# Patient Record
Sex: Male | Born: 1935
Health system: Southern US, Community
[De-identification: ages and names within clinical notes are randomized; demographics above are authoritative.]

## PROBLEM LIST (undated history)

## (undated) DIAGNOSIS — E785 Hyperlipidemia, unspecified: Secondary | ICD-10-CM

## (undated) DIAGNOSIS — R351 Nocturia: Secondary | ICD-10-CM

## (undated) DIAGNOSIS — Z8673 Personal history of transient ischemic attack (TIA), and cerebral infarction without residual deficits: Secondary | ICD-10-CM

## (undated) DIAGNOSIS — I2510 Atherosclerotic heart disease of native coronary artery without angina pectoris: Secondary | ICD-10-CM

## (undated) DIAGNOSIS — M109 Gout, unspecified: Secondary | ICD-10-CM

## (undated) DIAGNOSIS — N401 Enlarged prostate with lower urinary tract symptoms: Secondary | ICD-10-CM

## (undated) DIAGNOSIS — I10 Essential (primary) hypertension: Secondary | ICD-10-CM

## (undated) HISTORY — DX: Benign prostatic hyperplasia with lower urinary tract symptoms: N40.1

## (undated) HISTORY — PX: OTHER SURGICAL HISTORY: SHX169

## (undated) HISTORY — PX: CORONARY STENT PLACEMENT: SHX1402

## (undated) HISTORY — DX: Atherosclerotic heart disease of native coronary artery without angina pectoris: I25.10

## (undated) HISTORY — DX: Gout, unspecified: M10.9

## (undated) HISTORY — DX: Personal history of transient ischemic attack (TIA), and cerebral infarction without residual deficits: Z86.73

## (undated) HISTORY — PX: CARDIAC SURGERY: SHX584

## (undated) HISTORY — DX: Nocturia: R35.1

## (undated) NOTE — *Deleted (*Deleted)
HEMATOLOGY/ONCOLOGY CLINIC NOTE  Date of Service: 03/09/20    Patient Care Team: [name], MD as PCP - General (Family Medicine) [name], MD as PCP - Cardiology (Cardiology)  CHIEF COMPLAINTS/PURPOSE OF CONSULTATION:  -metastatic prostate cancer -Myelopthisic anemia  HISTORY OF PRESENTING ILLNESS:   Tony [name] is a wonderful 74 y.o. male who has been referred to [place] by Dr Dimas Chyle for evaluation and management of anemia. He is accompanied today by his daughter. The pt reports that he is doing well overall.   The pt reports a new onset of fatigue that began in January 2019. His daughter notes being able to tell a difference in his energy levels as early as November 2018. He notes that some cramping pain in his thighs. He notes that his recent 07/23/17 blood transfusion has led to a "terrific, immediate change" in his thigh pain. However, his thigh pain has returned in the last couple days.  He notes that he takes 1065mcg Vitamin B12 daily.   He notes that for 6-7 years he took iron pills after his 2012 heart attack and stroke. He notes that he took a statin for 6-7 months and was taken off of it recently. He notes no unresolved symptoms from his stroke.   The pt notes that over the last 4-5 months his appetite has diminished and he has subsequently lost about 20 lbs in that time.  He notes that he has historically not preferred to drink water as such, but hydrates with juice, coffee, and other drinks.  He notes that he believes that he is able to take care of himself adequately at home. He also notes that his cane is sufficient for helping him get around.   Most recent lab results (07/22/17) of CBC  is as follows: all values are WNL except for RBC at 2.94, Hgb at 8.7, HCT at 27.2, RDW at 26.5, Platelets at 134k. CBC from 10/30/16 revealed all values WNL.  CMP 07/21/17 revealed all values WNL except for Sodium at 134, Glucose at 101, Creatinine at 1.85, Calcium  at 7.9, Total Protein at 5.7, Albumin at 2.7, Alk Phos at 433.  LDH 07/20/17 elevated at 256. Haptoglobin 07/20/17 elevated at 357.  Vitamin B12 07/06/17 is WNL at 229.   On review of systems, pt reports decreased appetite, losing 20 lbs over 4-5 months, bilateral thigh pain, fatigue, mild leg swelling, and denies fevers, chills, night sweats, bone pains, nausea, abdominal pains, bleeding, blood in the urine, blood in the stools, black stools, changes in bowel habits, light headednss, dizziness, abdominal pains, noticing any new lumps or bumps, testicular pain or swelling, and any other symptoms.   On PMHx the pt reports heart attack and stroke in 2012. On Social Hx the pt denies much ETOH consumption.   Interval History: Tony [name] returns today regarding his metastatic prostate cancer and myelophthisic anemia. We are joined by his daughter, Tony [name].*** The patient's last visit with [place] was on 02/10/2020. The pt reports that he is doing well overall.  The pt reports ***  Of note since the patient's last visit, pt has had *** completed on *** with results revealing ***.  Lab results today (03/09/20) of CBC w/diff and CMP is as follows: all values are WNL except for ***.  On review of systems, pt reports *** and denies ***and any other symptoms.   A&P: -Discussed pt labwork today, 03/09/20; *** -***  MEDICAL HISTORY:  Past Medical History:  Diagnosis Date  . BPH associated with nocturia    flomax 0.4--> 0.8 mg trial. nocturia if has coffee. some incontinence  . CAD (coronary artery disease)    LAD Stent 2012. 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   . Gout    no rx. apparently 1x in past  . History of stroke    no aspirin before stroke. no deficits. slurred words at time of stroke  . Hyperlipidemia   . Hypertension     SURGICAL HISTORY: Past Surgical History:  Procedure Laterality Date  . CARDIAC SURGERY     Stint  . CORONARY STENT PLACEMENT    . left  arm fracture s/p surgery    . right leg fracture s.p surgery- screws like arm      SOCIAL HISTORY: Social History   Socioeconomic History  . Marital status: Married    Spouse name: Not on file  . Number of children: Not on file  . Years of education: Not on file  . Highest education level: Not on file  Occupational History  . Not on file  Tobacco Use  . Smoking status: Former Smoker    Packs/day: 0.50    Years: 1.00    Pack years: 0.50    Types: Cigarettes    Quit date: 05/01/1952    Years since quitting: 67.9  . Smokeless tobacco: Never Used  Vaping Use  . Vaping Use: Never used  Substance and Sexual Activity  . Alcohol use: No  . Drug use: No  . Sexual activity: Not on file  Other Topics Concern  . Not on file  Social History Narrative   Married- lives separate from wife. 2 children. Daughter passed from heart attack.       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 Difficulty of Paying Living Expenses: Not on file  Food Insecurity:   . Worried About Charity fundraiser in the Last Year: Not on file  . Ran Out of Food in the Last Year: Not on file  Transportation Needs:   . Lack of Transportation (Medical): Not on file  . Lack of Transportation (Non-Medical): Not on file  Physical Activity:   . Days of Exercise per Week: Not on file  . Minutes of Exercise per Session: Not on file  Stress:   . Feeling of Stress : Not on file  Social Connections:   . Frequency of Communication with Friends and Family: Not on file  . Frequency of Social Gatherings with Friends and Family: Not on file  . Attends Religious Services: Not on file  . Active Member of Clubs or Organizations: Not on file  . Attends Archivist Meetings: Not on file  . Marital Status: Not on file  Intimate Partner Violence:   . Fear of Current or Ex-Partner: Not on file  . Emotionally Abused:  Not on file  . Physically Abused: Not on file  . Sexually Abused: Not on file    FAMILY HISTORY: Family History  Problem Relation Age of Onset  . Breast cancer Mother   . Heart disease Father   . Heart disease Brother        x2  . Prostate cancer Brother     ALLERGIES:  is allergic to gabapentin.  MEDICATIONS:  No current facility-administered medications for this visit.   No current  outpatient medications on file.   Facility-Administered Medications Ordered in Other Visits  Medication Dose Route Frequency Provider Last Rate Last Admin  . acetaminophen (TYLENOL) tablet 650 mg  650 mg Oral Q6H PRN Bonnielee Haff, MD       Or  . acetaminophen (TYLENOL) suppository 650 mg  650 mg Rectal Q6H PRN Bonnielee Haff, MD      . aspirin EC tablet 81 mg  81 mg Oral Daily Bonnielee Haff, MD   81 mg at 03/08/20 1000  . carvedilol (COREG) tablet 12.5 mg  12.5 mg Oral BID WC Bonnielee Haff, MD   12.5 mg at 03/08/20 1735  . feeding supplement (NEPRO CARB STEADY) liquid 237 mL  237 mL Oral TID BM Bonnielee Haff, MD 237 mL/hr at 03/08/20 1349 237 mL at 03/08/20 2126  . fentaNYL (DURAGESIC) 25 MCG/HR 1 patch  1 patch Transdermal Q72H Bonnielee Haff, MD   1 patch at 03/07/20 0853  . heparin injection 5,000 Units  5,000 Units Subcutaneous Q8H Bonnielee Haff, MD   5,000 Units at 03/09/20 0656  . heparin lock flush 100 unit/mL  500 Units Intracatheter Daily PRN Brunetta Genera, MD      . heparin lock flush 100 unit/mL  250 Units Intracatheter PRN Brunetta Genera, MD      . isosorbide-hydrALAZINE (BIDIL) 20-37.5 MG per tablet 1 tablet  1 tablet Oral BID Bonnielee Haff, MD   1 tablet at 03/08/20 2124  . multivitamin with minerals tablet 1 tablet  1 tablet Oral Daily Cherene Altes, MD   1 tablet at 03/08/20 1000  . ondansetron (ZOFRAN) tablet 4 mg  4 mg Oral Q6H PRN Bonnielee Haff, MD       Or  . ondansetron [facility]) injection 4 mg  4 mg Intravenous Q6H PRN Bonnielee Haff, MD       . oxyCODONE (Oxy IR/ROXICODONE) immediate release tablet 5 mg  5 mg Oral Q4H PRN Bonnielee Haff, MD      . polyethylene glycol (MIRALAX / GLYCOLAX) packet 17 g  17 g Oral Daily Bonnielee Haff, MD   17 g at 03/08/20 1001  . predniSONE (DELTASONE) tablet 5 mg  5 mg Oral Q breakfast Bonnielee Haff, MD   5 mg at 03/08/20 1000  . QUEtiapine (SEROQUEL) tablet 12.5 mg  12.5 mg Oral QHS Cherene Altes, MD   12.5 mg at 03/08/20 2126  . senna-docusate (Senokot-S) tablet 2 tablet  2 tablet Oral Daily PRN Bonnielee Haff, MD      . sodium chloride flush (NS) 0.9 % injection 10 mL  10 mL Intracatheter PRN Brunetta Genera, MD      . sodium chloride flush (NS) 0.9 % injection 3 mL  3 mL Intracatheter PRN Brunetta Genera, MD      . tamsulosin [facility]) capsule 0.4 mg  0.4 mg Oral BID Bonnielee Haff, MD   0.4 mg at 03/08/20 2123  . vitamin B-12 (CYANOCOBALAMIN) tablet 1,000 mcg  1,000 mcg Oral Daily Bonnielee Haff, MD   1,000 mcg at 03/08/20 1000    REVIEW OF SYSTEMS:   A 10+ POINT REVIEW OF SYSTEMS WAS OBTAINED including neurology, dermatology, psychiatry, cardiac, respiratory, lymph, extremities, GI, GU, Musculoskeletal, constitutional, breasts, reproductive, HEENT.  All pertinent positives are noted in the HPI.  All others are negative.   PHYSICAL EXAMINATION: ECOG FS:2 - Symptomatic, <50% confined to bed  There were no vitals filed for this visit. Wt Readings from Last 3 Encounters:  03/04/20 172 lb  6.4 oz (78.2 kg)  02/26/20 176 lb (79.8 kg)  02/20/20 174 lb (78.9 kg)   *** GENERAL:alert, in no acute distress and comfortable SKIN: no acute rashes, no significant lesions EYES: conjunctiva are pink and non-injected, sclera anicteric OROPHARYNX: MMM, no exudates, no oropharyngeal erythema or ulceration NECK: supple, no JVD LYMPH:  no palpable lymphadenopathy in the cervical, axillary or inguinal regions LUNGS: clear to auscultation b/l with normal respiratory effort HEART: regular  rate & rhythm ABDOMEN:  normoactive bowel sounds , non tender, not distended. No palpable hepatosplenomegaly.  Extremity: no pedal edema PSYCH: alert & oriented x 3 with fluent speech NEURO: no focal motor/sensory deficits  LABORATORY DATA:  I have reviewed the data as listed  . CBC Latest Ref Rng & Units 03/09/2020 03/08/2020 03/07/2020  WBC 4.0 - 10.5 K/uL 6.5 6.8 8.1  Hemoglobin 13.0 - 17.0 g/dL 9.3(L) 9.4(L) 9.5(L)  Hematocrit 39 - 52 % 30.8(L) 30.6(L) 30.5(L)  Platelets 150 - 400 K/uL 207 215 205   . CBC    Component Value Date/Time   WBC 6.5 03/09/2020 0430   RBC 3.06 (L) 03/09/2020 0430   HGB 9.3 (L) 03/09/2020 0430   HGB 8.6 (L) 02/20/2020 1351   HCT 30.8 (L) 03/09/2020 0430   HCT 21.9 (L) 07/20/2017 2103   PLT 207 03/09/2020 0430   PLT 203 02/20/2020 1351   MCV 100.7 (H) 03/09/2020 0430   MCH 30.4 03/09/2020 0430   MCHC 30.2 03/09/2020 0430   RDW 18.5 (H) 03/09/2020 0430   LYMPHSABS 1.1 03/04/2020 1404   MONOABS 0.5 03/04/2020 1404   EOSABS 0.0 03/04/2020 1404   BASOSABS 0.0 03/04/2020 1404     . CMP Latest Ref Rng & Units 03/09/2020 03/08/2020 03/07/2020  Glucose 70 - 99 mg/dL 122(H) 108(H) 98  BUN 8 - 23 mg/dL 53(H) 50(H) 45(H)  Creatinine 0.61 - 1.24 mg/dL 3.03(H) 2.28(H) 2.93(H)  Sodium 135 - 145 mmol/L 138 137 137  Potassium 3.5 - 5.1 mmol/L 4.5 4.0 4.1  Chloride 98 - 111 mmol/L 103 102 102  CO2 22 - 32 mmol/L 27 28 27   Calcium 8.9 - 10.3 mg/dL 7.9(L) 7.9(L) 7.9(L)  Total Protein 6.5 - 8.1 g/dL - 5.3(L) 5.4(L)  Total Bilirubin 0.3 - 1.2 mg/dL - 0.7 0.4  Alkaline Phos 38 - 126 U/L - 186(H) 180(H)  AST 15 - 41 U/L - 17 17  ALT 0 - 44 U/L - 11 11    Component     Latest Ref Rng & Units 07/30/2017 09/06/2017 10/04/2017 10/31/2017  Prostate Specific Ag, Serum     0.0 - 4.0 ng/mL 1,186.0 (H) 433.8 (H) 67.4 (H) 55.8 (H)   07/31/17 BM Bx:   RADIOGRAPHIC STUDIES: I have personally reviewed the radiological images as listed and agreed with the findings in the  report. MR BRAIN WO CONTRAST  Result Date: 03/08/2020 CLINICAL DATA:  Neuro deficit, acute, stroke suspected EXAM: MRI HEAD WITHOUT CONTRAST TECHNIQUE: Multiplanar, multiecho pulse sequences of the brain and surrounding structures were obtained without intravenous contrast. COMPARISON:  None. FINDINGS: Brain: No diffusion-weighted signal abnormality. Small foci of SWI signal dropout involving the medial left occipital lobe and left pons. No midline shift, ventriculomegaly or extra-axial fluid collection. No mass lesion. Mild cerebral atrophy with ex vacuo dilatation. Advanced chronic microvascular ischemic changes. Chronic lacunar insults involving the corona radiata, left basal ganglia, right thalamus and bilateral cerebellum. Vascular: Normal flow voids. Skull and upper cervical spine: Calvarial heterogeneity. Sinuses/Orbits: Normal orbits. Mild ethmoid sinus mucosal  thickening. Clear mastoid air cells. Other: 9 mm left masticator space FLAIR hyperintensity (9:12) with correlative T1 hypointense signal (14:41). IMPRESSION: No acute intracranial process. Advanced chronic microvascular ischemic changes and multifocal lacunar insults as detailed above. Nonspecific susceptibility artifact involving the medial left occipital lobe and left pons. Postcontrast MRI is recommended for further evaluation if patient can tolerate. Subcentimeter left masticator space FLAIR hyperintensity may reflect prominence of the pterygoid venous plexus however postcontrast MRI is also recommended to exclude metastatic lesion. Calvarial heterogeneity can also be better evaluated on postcontrast imaging. These results will be called to the ordering clinician or representative by the Radiologist Assistant, and communication documented in the PACS or [employer]. Electronically Signed   By: Primitivo Gauze M.D.   On: 03/08/2020 14:58   US Renal  Result Date: 03/04/2020 CLINICAL DATA:  Renal failure EXAM: RENAL / URINARY TRACT  ULTRASOUND COMPLETE COMPARISON:  Head CT 03/04/2020 FINDINGS: Right Kidney: Renal measurements: 9.6 x 4.2 x 4.4 cm = volume: 92 mL. Echogenicity is increased. No mass or hydronephrosis visualized. Left Kidney: Renal measurements: 8.8 x 4.3 x 3.9 cm = volume: 77 mL. Echogenicity is increased. No mass or hydronephrosis visualized. Bladder: Appears normal for degree of bladder distention. Other: None. IMPRESSION: Increased echogenicity of the renal parenchyma suggestive of renal disease. Electronically Signed   By: Iven Finn M.D.   On: 03/04/2020 18:16   NM PET (AXUMIN) SKULL BASE TO MID THIGH  Result Date: 03/04/2020 CLINICAL DATA:  Prostate carcinoma with biochemical recurrence. Increasing PSA. Prostate cancer with bone metastasis. Hormonal therapy. PSA equal 46 EXAM: NUCLEAR MEDICINE PET SKULL BASE TO THIGH TECHNIQUE: 8.6 mCi F-18 Fluciclovine was injected intravenously. Full-ring PET imaging was performed from the skull base to thigh after the radiotracer. CT data was obtained and used for attenuation correction and anatomic localization. COMPARISON:  Fluciclovine PET-CT scan 10/16/2019 FINDINGS: NECK No radiotracer activity in neck lymph nodes. Incidental CT finding: None CHEST No radiotracer accumulation within mediastinal or hilar lymph nodes. No suspicious pulmonary nodules on the CT scan. Incidental CT finding: None ABDOMEN/PELVIS Prostate: Diffuse activity in the prostate gland similar comparison exam with SUV max equal 4.4 compared SUV max equal 4.7. Lymph nodes: No abnormal radiotracer accumulation within pelvic or abdominal nodes. Liver: No evidence of liver metastasis Incidental CT finding: Sigmoid diverticulosis. SKELETON Again demonstrated diffuse sclerotic skeletal metastasis throughout the axillary and appendicular skeleton. Internal fixation of the RIGHT femur. On the background of diffuse sclerotic skeletal metastasis, there are several foci of discrete radiotracer activity not changed  from prior. For example lesion in the intertrochanteric region of the proximal left femur SUV max equal 5.5 compared SUV max equal 5.7. Lesion in the mid thoracic spine with SUV max equal 3.7 compared SUV max equal 3.9. No evidence of progressive skeletal metastasis by Fluciclovine PET imaging. IMPRESSION: 1. No evidence prostate cancer progression by the Fluciclovine PET CT imaging. Consider Pylarify (F18 PSMA) PET imaging (available at [facility]) for potential increased specificity/sensitivity. 2. Widespread diffuse sclerotic skeletal metastasis the majority of which is not radiotracer avid. Several foci discrete radiotracer avidity within the spine and pelvis are similar to comparison exam 3. No evidence of nodal metastasis or visceral metastasis. Electronically Signed   By: Suzy Bouchard M.D.   On: 03/04/2020 16:22    ASSESSMENT & PLAN:   58 y.o. male with  1. Normocytic Anemia - due to primarily metastatic prostatic cancer (ACD + BM involvement with prostate cancer causing myelopthisic picture)  -Pt presented  with a Hgb at 8.7 on 07/22/17, improved to 10.3 after receiving PRBC transfusion. -In review of the patient's previous CBC records, his anemia developed 6-7 months before presenting to care with me, unaccompanied by a drop in his EPO (07/20/17 elevated at 249.8);  LDH elevated  but haptoglobin at 357 on 07/20/17. -suggested against active hemolysis.  2. Recently diagnosed metastatic prostate cancer with extensive bone metastases. With bone mets/BM Mets and LNadenopathy. Exam positive for left retroperitoneal, bilateral pelvis and right inguinal adenopathy. Exam positive for left retroperitoneal, bilateral pelvis and right inguinal adenopathy. Diffuse sclerotic bone metastasis.   3. Elevated alkaline phosphatase due to bone metastases from prostate cancer -CT BM bx -concerning for significant bone lesions in lower spine and pelvis PSA levels have declined to 26.9  4. Cancer related  pain - primarily in b/l thighs. -much improved.  PLAN:  *** -As pt is not currently having much symptomatic disease progression and we could watch at this time, but this may cause worsening anemia. -Discussed using Sipuleucel-t (though logistic likely untenable) vs androgen deprivation therapy vs watchful observation vs consideration of chemotherapy vs consideration of other targeted therapies (immunotherapy or BRCA targeting therapies if appropriate) -Advised pt that we can re-send tumor markers to see if there are other therapeutic options - this is my recommendation. -Discussed CDC guidelines regarding the [place] booster. Will give in clinic after PET scan. -Continue Xgeva q4weeks and Lupron q12weeks  -Continue taking Vitamin D supplement   FOLLOW UP: ***  The total time spent in the appt was *** minutes and more than 50% was on counseling and direct patient cares.  All of the patient's questions were answered with apparent satisfaction. The patient knows to call the clinic with any problems, questions or concerns.   Sullivan Lone MD [place] AAHIVMS [facility] Hematology/Oncology Physician [facility]  (Office):       [phone] (Work cell):  [phone] (Fax):           [phone]  03/09/2020 8:05 AM   I, Yevette Edwards, am acting as a scribe for Dr. Sullivan Lone.   {Add Barista Statement}

## (undated) NOTE — *Deleted (*Deleted)
HEMATOLOGY/ONCOLOGY CLINIC NOTE  Date of Service: 02/22/20    Patient Care Team: [name], MD as PCP - General (Family Medicine) [name], MD as PCP - Cardiology (Cardiology)  CHIEF COMPLAINTS/PURPOSE OF CONSULTATION:  -metastatic prostate cancer -Myelopthisic anemia  HISTORY OF PRESENTING ILLNESS:   Tony [name] is a wonderful 71 y.o. male who has been referred to [place] by Dr Dimas Chyle for evaluation and management of anemia. He is accompanied today by his daughter. The pt reports that he is doing well overall.   The pt reports a new onset of fatigue that began in January 2019. His daughter notes being able to tell a difference in his energy levels as early as November 2018. He notes that some cramping pain in his thighs. He notes that his recent 07/23/17 blood transfusion has led to a "terrific, immediate change" in his thigh pain. However, his thigh pain has returned in the last couple days.  He notes that he takes 1061mcg Vitamin B12 daily.   He notes that for 6-7 years he took iron pills after his 2012 heart attack and stroke. He notes that he took a statin for 6-7 months and was taken off of it recently. He notes no unresolved symptoms from his stroke.   The pt notes that over the last 4-5 months his appetite has diminished and he has subsequently lost about 20 lbs in that time.  He notes that he has historically not preferred to drink water as such, but hydrates with juice, coffee, and other drinks.  He notes that he believes that he is able to take care of himself adequately at home. He also notes that his cane is sufficient for helping him get around.   Most recent lab results (07/22/17) of CBC  is as follows: all values are WNL except for RBC at 2.94, Hgb at 8.7, HCT at 27.2, RDW at 26.5, Platelets at 134k. CBC from 10/30/16 revealed all values WNL.  CMP 07/21/17 revealed all values WNL except for Sodium at 134, Glucose at 101, Creatinine at 1.85, Calcium  at 7.9, Total Protein at 5.7, Albumin at 2.7, Alk Phos at 433.  LDH 07/20/17 elevated at 256. Haptoglobin 07/20/17 elevated at 357.  Vitamin B12 07/06/17 is WNL at 229.   On review of systems, pt reports decreased appetite, losing 20 lbs over 4-5 months, bilateral thigh pain, fatigue, mild leg swelling, and denies fevers, chills, night sweats, bone pains, nausea, abdominal pains, bleeding, blood in the urine, blood in the stools, black stools, changes in bowel habits, light headednss, dizziness, abdominal pains, noticing any new lumps or bumps, testicular pain or swelling, and any other symptoms.   On PMHx the pt reports heart attack and stroke in 2012. On Social Hx the pt denies much ETOH consumption.   Interval History: I connected with  Tony [name] on 02/22/20 by telephone and verified that I am speaking with the correct person using two identifiers.   I discussed the limitations of evaluation and management by telemedicine. The patient expressed understanding and agreed to proceed.  Other persons participating in the visit and their role in the encounter:   Patient's location: Home Provider's location: [facility] [place]  Tony [name] returns today regarding his metastatic prostate cancer and myelophthisic anemia. The patient's last visit with [place] was on 02/10/2020. The pt reports that he is doing well overall.  The pt reports ***  Of note since the patient's last visit, pt  has had *** completed on *** with results revealing ***.  Lab results today (02/22/20) of CBC w/diff and CMP is as follows: all values are WNL except for ***.  On review of systems, pt reports *** and denies ***and any other symptoms.   A&P: -Discussed pt labwork today, 02/22/20; *** -***   MEDICAL HISTORY:  Past Medical History:  Diagnosis Date  . BPH associated with nocturia    flomax 0.4--> 0.8 mg trial. nocturia if has coffee. some incontinence  . CAD (coronary artery disease)    LAD Stent 2012.  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   . Gout    no rx. apparently 1x in past  . History of stroke    no aspirin before stroke. no deficits. slurred words at time of stroke  . Hyperlipidemia   . Hypertension     SURGICAL HISTORY: Past Surgical History:  Procedure Laterality Date  . CARDIAC SURGERY     Stint  . CORONARY STENT PLACEMENT    . left arm fracture s/p surgery    . right leg fracture s.p surgery- screws like arm      SOCIAL HISTORY: Social History   Socioeconomic History  . Marital status: Married    Spouse name: Not on file  . Number of children: Not on file  . Years of education: Not on file  . Highest education level: Not on file  Occupational History  . Not on file  Tobacco Use  . Smoking status: Former Smoker    Packs/day: 0.50    Years: 1.00    Pack years: 0.50    Types: Cigarettes    Quit date: 05/01/1952    Years since quitting: 67.8  . Smokeless tobacco: Never Used  Vaping Use  . Vaping Use: Never used  Substance and Sexual Activity  . Alcohol use: No  . Drug use: No  . Sexual activity: Not on file  Other Topics Concern  . Not on file  Social History Narrative   Married- lives separate from wife. 2 children. Daughter passed from heart attack.       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 Difficulty of Paying Living Expenses: Not on file  Food Insecurity:   . Worried About Charity fundraiser in the Last Year: Not on file  . Ran Out of Food in the Last Year: Not on file  Transportation Needs:   . Lack of Transportation (Medical): Not on file  . Lack of Transportation (Non-Medical): Not on file  Physical Activity:   . Days of Exercise per Week: Not on file  . Minutes of Exercise per Session: Not on file  Stress:   . Feeling of Stress : Not on file  Social Connections:   . Frequency of Communication with Friends  and Family: Not on file  . Frequency of Social Gatherings with Friends and Family: Not on file  . Attends Religious Services: Not on file  . Active Member of Clubs or Organizations: Not on file  . Attends Archivist Meetings: Not on file  . Marital Status: Not on file  Intimate Partner Violence:   . Fear of Current or Ex-Partner: Not on file  . Emotionally Abused: Not on file  . Physically Abused: Not on file  . Sexually Abused: Not on file    FAMILY  HISTORY: Family History  Problem Relation Age of Onset  . Breast cancer Mother   . Heart disease Father   . Heart disease Brother        x2  . Prostate cancer Brother     ALLERGIES:  is allergic to gabapentin.  MEDICATIONS:  Current Outpatient Medications  Medication Sig Dispense Refill  . aspirin EC 81 MG tablet Take 1 tablet (81 mg total) by mouth daily. 90 tablet 3  . BIDIL 20-37.5 MG tablet TAKE 1 TABLET BY MOUTH TWICE DAILY 180 tablet 3  . calcium-vitamin D (OSCAL 500/200 D-3) 500-200 MG-UNIT tablet Take 1 tablet by mouth daily with breakfast. 30 tablet 2  . carvedilol (COREG) 12.5 MG tablet Take 1 tablet (12.5 mg total) by mouth 2 (two) times daily with a meal. 90 tablet 1  . fentaNYL (DURAGESIC) 25 MCG/HR Place 1 patch onto the skin every 3 (three) days. 10 patch 0  . furosemide (LASIX) 40 MG tablet Take 1 tablet (40 mg total) by mouth daily. Make take one-half additional tablet 20 mg by mouth as needed for swelling or weight gain of 3 pounds or more in a day or 5 pounds or more in a week. 135 tablet 3  . Multiple Vitamins-Minerals (MULTI COMPLETE PO) Take 1 tablet by mouth daily.     . nitroGLYCERIN (NITROSTAT) 0.4 MG SL tablet Place 1 tablet (0.4 mg total) under the tongue every 5 (five) minutes as needed for chest pain (3 maximum before seeking care). 30 tablet 0  . Omega-3 1000 MG CAPS Take 1 capsule by mouth daily.     Marland Kitchen oxyCODONE (OXY IR/ROXICODONE) 5 MG immediate release tablet Take 1 tablet (5 mg total) by  mouth every 4 (four) hours as needed for severe pain. 90 tablet 0  . polyethylene glycol (MIRALAX) packet Take 17 g by mouth daily. 30 each 1  . predniSONE (DELTASONE) 5 MG tablet Take 1 tablet (5 mg total) by mouth daily with breakfast. 30 tablet 8  . senna-docusate (SENNA S) 8.6-50 MG tablet Take 2 tablets by mouth 2 (two) times daily. (Patient taking differently: Take 2 tablets by mouth as needed. ) 120 tablet 2  . tamsulosin (FLOMAX) 0.4 MG CAPS capsule TAKE 1 CAPSULE BY MOUTH TWICE DAILY 180 capsule 3  . vitamin B-12 (CYANOCOBALAMIN) 1000 MCG tablet Take 1 tablet (1,000 mcg total) by mouth daily. 30 tablet 0  . Vitamin D, Ergocalciferol, (DRISDOL) 1.25 MG (50000 UNIT) CAPS capsule Take 1 capsule Monday, Wednesday and Friday. 36 capsule 3   No current facility-administered medications for this visit.    REVIEW OF SYSTEMS:   A 10+ POINT REVIEW OF SYSTEMS WAS OBTAINED including neurology, dermatology, psychiatry, cardiac, respiratory, lymph, extremities, GI, GU, Musculoskeletal, constitutional, breasts, reproductive, HEENT.  All pertinent positives are noted in the HPI.  All others are negative.   PHYSICAL EXAMINATION: ECOG FS:2 - Symptomatic, <50% confined to bed  There were no vitals filed for this visit. Wt Readings from Last 3 Encounters:  02/20/20 174 lb (78.9 kg)  02/10/20 176 lb 9.6 oz (80.1 kg)  01/31/20 177 lb 4 oz (80.4 kg)   Telehealth visit   LABORATORY DATA:  I have reviewed the data as listed  . CBC Latest Ref Rng & Units 02/20/2020 02/10/2020 01/31/2020  WBC 4.0 - 10.5 K/uL 7.2 5.7 -  Hemoglobin 13.0 - 17.0 g/dL 8.6(L) 8.5(L) 8.8(L)  Hematocrit 39 - 52 % 28.4(L) 27.7(L) 28.8(L)  Platelets 150 - 400 K/uL 203 256 -   .  CBC    Component Value Date/Time   WBC 7.2 02/20/2020 1351   WBC 5.4 01/28/2020 0942   RBC 2.73 (L) 02/20/2020 1351   HGB 8.6 (L) 02/20/2020 1351   HCT 28.4 (L) 02/20/2020 1351   HCT 21.9 (L) 07/20/2017 2103   PLT 203 02/20/2020 1351   MCV  104.0 (H) 02/20/2020 1351   MCH 31.5 02/20/2020 1351   MCHC 30.3 02/20/2020 1351   RDW 15.7 (H) 02/20/2020 1351   LYMPHSABS 1.4 02/20/2020 1351   MONOABS 0.6 02/20/2020 1351   EOSABS 0.0 02/20/2020 1351   BASOSABS 0.0 02/20/2020 1351     . CMP Latest Ref Rng & Units 02/20/2020 02/10/2020 01/31/2020  Glucose 65 - 99 mg/dL 118(H) 115(H) 107(H)  BUN 8 - 27 mg/dL 33(H) 38(H) 41(H)  Creatinine 0.76 - 1.27 mg/dL 2.73(H) 2.50(H) 2.00(H)  Sodium 134 - 144 mmol/L 142 141 139  Potassium 3.5 - 5.2 mmol/L 4.3 4.5 3.8  Chloride 96 - 106 mmol/L 99 103 100  CO2 20 - 29 mmol/L 28 31 29   Calcium 8.6 - 10.2 mg/dL 8.4(L) 9.0 6.9(L)  Total Protein 6.5 - 8.1 g/dL - 6.6 -  Total Bilirubin 0.3 - 1.2 mg/dL - 0.4 -  Alkaline Phos 38 - 126 U/L - 168(H) -  AST 15 - 41 U/L - 15 -  ALT 0 - 44 U/L - 13 -    Component     Latest Ref Rng & Units 07/30/2017 09/06/2017 10/04/2017 10/31/2017  Prostate Specific Ag, Serum     0.0 - 4.0 ng/mL 1,186.0 (H) 433.8 (H) 67.4 (H) 55.8 (H)   07/31/17 BM Bx:   RADIOGRAPHIC STUDIES: I have personally reviewed the radiological images as listed and agreed with the findings in the report. DG Chest 2 View  Result Date: 01/25/2020 CLINICAL DATA:  Patient with bilateral feet and leg swelling. EXAM: CHEST - 2 VIEW COMPARISON:  Chest radiograph January 25, 2020 FINDINGS: Monitoring leads overlie the patient. Stable cardiomegaly. No large area pulmonary consolidation. No pleural effusion or pneumothorax. Patchy sclerotic osseous changes throughout the axial and appendicular skeleton compatible with history of metastatic prostate cancer. IMPRESSION: Cardiomegaly. No acute cardiopulmonary process. Electronically Signed   By: Lovey Newcomer M.D.   On: 01/25/2020 18:49   ECHOCARDIOGRAM COMPLETE  Result Date: 01/26/2020    ECHOCARDIOGRAM REPORT   Patient Name:   Tony [name] [facility] Date of Exam: 01/26/2020 Medical Rec #:  [id number]      Height:       67.0 in Accession #:    [id number]     Weight:        189.0 lb Date of Birth:  26-Jan-1931      BSA:          1.974 m Patient Age:    29 years       BP:           147/81 mmHg Patient Gender: M              HR:           72 bpm. Exam Location:  Inpatient Procedure: 2D Echo and Intracardiac Opacification Agent Indications:    CHF- Acute Systolic [id number]  History:        Patient has prior history of Echocardiogram examinations, most                 recent 03/02/2011. CAD; Risk Factors:Hypertension and  Dyslipidemia.  Sonographer:    Mikki Santee RDCS (AE) Referring Phys: [id number] [place]  1. Left ventricular ejection fraction, by estimation, is 35 to 40%. The left ventricle has moderately decreased function. The left ventricle demonstrates global hypokinesis. There is moderate concentric left ventricular hypertrophy. Left ventricular diastolic parameters are consistent with Grade I diastolic dysfunction (impaired relaxation).  2. Right ventricular systolic function is mildly reduced. The right ventricular size is mildly enlarged. There is mildly elevated pulmonary artery systolic pressure. The estimated right ventricular systolic pressure is [id number] mmHg.  3. The mitral valve is normal in structure. Mild mitral valve regurgitation. No evidence of mitral stenosis.  4. Tricuspid valve regurgitation is moderate.  5. The aortic valve is normal in structure. Aortic valve regurgitation is mild. No aortic stenosis is present.  6. The inferior vena cava is normal in size with greater than 50% respiratory variability, suggesting right atrial pressure of 3 mmHg. FINDINGS  Left Ventricle: Left ventricular ejection fraction, by estimation, is 35 to 40%. The left ventricle has moderately decreased function. The left ventricle demonstrates global hypokinesis. Definity contrast agent was given IV to delineate the left ventricular endocardial borders. The left ventricular internal cavity size was normal in size. There is moderate concentric left  ventricular hypertrophy. Abnormal (paradoxical) septal motion, consistent with left bundle branch block. Left ventricular diastolic parameters are consistent with Grade I diastolic dysfunction (impaired relaxation). Normal left ventricular filling pressure. Right Ventricle: The right ventricular size is mildly enlarged. No increase in right ventricular wall thickness. Right ventricular systolic function is mildly reduced. There is mildly elevated pulmonary artery systolic pressure. The tricuspid regurgitant  velocity is 3.14 m/s, and with an assumed right atrial pressure of 3 mmHg, the estimated right ventricular systolic pressure is [id number] mmHg. Left Atrium: Left atrial size was normal in size. Right Atrium: Right atrial size was normal in size. Pericardium: There is no evidence of pericardial effusion. Mitral Valve: The mitral valve is normal in structure. There is mild thickening of the mitral valve leaflet(s). Mild mitral annular calcification. Mild mitral valve regurgitation. No evidence of mitral valve stenosis. Tricuspid Valve: The tricuspid valve is normal in structure. Tricuspid valve regurgitation is moderate . No evidence of tricuspid stenosis. Aortic Valve: The aortic valve is normal in structure. Aortic valve regurgitation is mild. No aortic stenosis is present. Pulmonic Valve: The pulmonic valve was normal in structure. Pulmonic valve regurgitation is not visualized. No evidence of pulmonic stenosis. Aorta: The aortic root is normal in size and structure. Venous: The inferior vena cava is normal in size with greater than 50% respiratory variability, suggesting right atrial pressure of 3 mmHg. IAS/Shunts: No atrial level shunt detected by color flow Doppler.  LEFT VENTRICLE PLAX 2D LVIDd:         5.20 cm  Diastology LVIDs:         4.40 cm  LV e' medial:    4.64 cm/s LV PW:         1.10 cm  LV E/e' medial:  16.5 LV IVS:        1.30 cm  LV e' lateral:   5.34 cm/s LVOT diam:     2.10 cm  LV E/e' lateral:  14.3 LV SV:         58 LV SV Index:   29 LVOT Area:     3.46 cm  RIGHT VENTRICLE RV S prime:     18.30 cm/s TAPSE (M-mode): 1.9 cm LEFT ATRIUM  Index       RIGHT ATRIUM           Index LA diam:      3.20 cm 1.62 cm/m  RA Area:     17.90 cm LA Vol (A4C): 78.9 ml 39.97 ml/m RA Volume:   47.10 ml  23.86 ml/m  AORTIC VALVE LVOT Vmax:   101.00 cm/s LVOT Vmean:  59.100 cm/s LVOT VTI:    0.168 m  AORTA Ao Root diam: 3.10 cm MITRAL VALVE                TRICUSPID VALVE MV Area (PHT): 3.08 cm     TR Peak grad:   39.4 mmHg MV Decel Time: 246 msec     TR Vmax:        314.00 cm/s MV E velocity: 76.40 cm/s MV A velocity: 135.00 cm/s  SHUNTS MV E/A ratio:  0.57         Systemic VTI:  0.17 m                             Systemic Diam: 2.10 cm Ena Dawley MD Electronically signed by Ena Dawley MD Signature Date/Time: 01/26/2020/12:57:47 PM    Final     ASSESSMENT & PLAN:   23 y.o. male with  1. Normocytic Anemia - due to primarily metastatic prostatic cancer (ACD + BM involvement with prostate cancer causing myelopthisic picture)  -Pt presented with a Hgb at 8.7 on 07/22/17, improved to 10.3 after receiving PRBC transfusion. -In review of the patient's previous CBC records, his anemia developed 6-7 months before presenting to care with me, unaccompanied by a drop in his EPO (07/20/17 elevated at 249.8);  LDH elevated  but haptoglobin at 357 on 07/20/17. -suggested against active hemolysis.  2. Recently diagnosed metastatic prostate cancer with extensive bone metastases. With bone mets/BM Mets and LNadenopathy. Exam positive for left retroperitoneal, bilateral pelvis and right inguinal adenopathy. Exam positive for left retroperitoneal, bilateral pelvis and right inguinal adenopathy. Diffuse sclerotic bone metastasis.   3. Elevated alkaline phosphatase due to bone metastases from prostate cancer -CT BM bx -concerning for significant bone lesions in lower spine and pelvis PSA levels have declined  to 26.9  4. Cancer related pain - primarily in b/l thighs. -much improved.  PLAN:  ***   FOLLOW UP: ***   The total time spent in the appt was *** minutes and more than 50% was on counseling and direct patient cares.  All of the patient's questions were answered with apparent satisfaction. The patient knows to call the clinic with any problems, questions or concerns.    Sullivan Lone MD [place] AAHIVMS [facility] Hematology/Oncology Physician [facility]  (Office):       [phone] (Work cell):  [phone] (Fax):           [phone]  02/22/2020 7:42 PM   I, Yevette Edwards, am acting as a scribe for Dr. Sullivan Lone.   {Add Barista Statement}

---

## 2012-05-06 ENCOUNTER — Encounter (HOSPITAL_COMMUNITY): Payer: Self-pay | Admitting: Cardiology

## 2012-05-06 ENCOUNTER — Emergency Department (HOSPITAL_COMMUNITY)
Admission: EM | Admit: 2012-05-06 | Discharge: 2012-05-06 | Disposition: A | Discharge status: Home / Self Care | Payer: Medicare Other | Attending: Emergency Medicine | Admitting: Emergency Medicine

## 2012-05-06 ENCOUNTER — Emergency Department (HOSPITAL_COMMUNITY): Payer: Medicare Other

## 2012-05-06 DIAGNOSIS — I10 Essential (primary) hypertension: Secondary | ICD-10-CM | POA: Insufficient documentation

## 2012-05-06 DIAGNOSIS — N453 Epididymo-orchitis: Secondary | ICD-10-CM | POA: Insufficient documentation

## 2012-05-06 DIAGNOSIS — Z7982 Long term (current) use of aspirin: Secondary | ICD-10-CM | POA: Insufficient documentation

## 2012-05-06 DIAGNOSIS — Z79899 Other long term (current) drug therapy: Secondary | ICD-10-CM | POA: Insufficient documentation

## 2012-05-06 HISTORY — DX: Essential (primary) hypertension: I10

## 2012-05-06 HISTORY — DX: Hyperlipidemia, unspecified: E78.5

## 2012-05-06 LAB — URINALYSIS, ROUTINE W REFLEX MICROSCOPIC
Nitrite: NEGATIVE
Specific Gravity, Urine: 1.028 (ref 1.005–1.030)
pH: 5 (ref 5.0–8.0)

## 2012-05-06 LAB — URINE MICROSCOPIC-ADD ON

## 2012-05-06 MED ORDER — HYDROCODONE-ACETAMINOPHEN 5-325 MG PO TABS: 2.0000 | ORAL_TABLET | ORAL | Status: DC | PRN

## 2012-05-06 MED ORDER — CIPROFLOXACIN HCL 500 MG PO TABS: 500.0000 mg | ORAL_TABLET | Freq: Two times a day (BID) | ORAL | Status: DC

## 2012-05-06 MED ORDER — CEFTRIAXONE SODIUM 1 G IJ SOLR
1.0000 g | Freq: Once | INTRAMUSCULAR | Status: AC
  Administered 2012-05-06: 1 g via INTRAMUSCULAR
  Filled 2012-05-06: qty 10

## 2012-05-06 NOTE — ED Provider Notes (Signed)
History     CSN: [id number]  Arrival date & time 05/06/12  63   First MD Initiated Contact with Patient 05/06/12 1949      Chief Complaint  Patient presents with  . Groin Pain    (Consider location/radiation/quality/duration/timing/severity/associated sxs/prior treatment) Patient is a 79 y.o. male presenting with groin pain. The history is provided by the patient.  Groin Pain   patient here with 8 days of left testicular pain and swelling. No dysuria or hematuria but does note some incontinence. No fever or chills. No flank pain. No fever, vomiting or diarrhea. Pain characterized as sharp and worse with movement of the scrotum. Does have a history of BPH. No medications taken prior to arrival  Past Medical History  Diagnosis Date  . Hypertension     Past Surgical History  Procedure Date  . Cardiac surgery   . Coronary stent placement     History reviewed. No pertinent family history.  History  Substance Use Topics  . Smoking status: Never Smoker   . Smokeless tobacco: Not on file  . Alcohol Use: No      Review of Systems  All other systems reviewed and are negative.    Allergies  Review of patient's allergies indicates no known allergies.  Home Medications   Current Outpatient Rx  Name  Route  Sig  Dispense  Refill  . AMLODIPINE BESYLATE 5 MG PO TABS   Oral   Take 5 mg by mouth daily.         . ASPIRIN 325 MG PO TABS   Oral   Take 325 mg by mouth daily.         . ATORVASTATIN CALCIUM 80 MG PO TABS   Oral   Take 80 mg by mouth daily.         Marland Kitchen FAMOTIDINE 20 MG PO TABS   Oral   Take 20 mg by mouth 2 (two) times daily.         Marland Kitchen FERROUS SULFATE 325 (65 FE) MG PO TABS   Oral   Take 325 mg by mouth 3 (three) times daily with meals as needed. For iron supplement         . ISOSORB DINITRATE-HYDRALAZINE 20-37.5 MG PO TABS   Oral   Take 1 tablet by mouth 3 (three) times daily.         Marland Kitchen METOPROLOL SUCCINATE ER 25 MG PO TB24   Oral  Take 25 mg by mouth daily.         Marland Kitchen TAMSULOSIN HCL 0.4 MG PO CAPS   Oral   Take 0.4 mg by mouth daily.            BP 135/67  Pulse 107  Temp 97.3 F (36.3 C) (Oral)  Resp 18  SpO2 95%  Physical Exam  Nursing note and vitals reviewed. Constitutional: He is oriented to person, place, and time. He appears well-developed and well-nourished.  Non-toxic appearance. No distress.  HENT:  Head: Normocephalic and atraumatic.  Eyes: Conjunctivae normal, EOM and lids are normal. Pupils are equal, round, and reactive to light.  Neck: Normal range of motion. Neck supple. No tracheal deviation present. No mass present.  Cardiovascular: Normal rate, regular rhythm and normal heart sounds.  Exam reveals no gallop.   No murmur heard. Pulmonary/Chest: Effort normal and breath sounds normal. No stridor. No respiratory distress. He has no decreased breath sounds. He has no wheezes. He has no rhonchi. He has no rales.  Abdominal: Soft. Normal appearance and bowel sounds are normal. He exhibits no distension. There is no tenderness. There is no rebound and no CVA tenderness.  Genitourinary:    Right testis shows tenderness.  Musculoskeletal: Normal range of motion. He exhibits no edema and no tenderness.  Neurological: He is alert and oriented to person, place, and time. He has normal strength. No cranial nerve deficit or sensory deficit. GCS eye subscore is 4. GCS verbal subscore is 5. GCS motor subscore is 6.  Skin: Skin is warm and dry. No abrasion and no rash noted.  Psychiatric: He has a normal mood and affect. His speech is normal and behavior is normal.    ED Course  Procedures (including critical care time)   Labs Reviewed  URINALYSIS, ROUTINE W REFLEX MICROSCOPIC  URINE CULTURE   No results found.   No diagnosis found.    MDM  Patient given Cipro and referral to urology        Leota Jacobsen, MD 05/06/12 2221

## 2012-05-06 NOTE — ED Notes (Signed)
Pt dc to home.  Pt states understanding to dc paperwork.  Pt ambulatory to exit without difficulty.  Pt denies need for w/c.

## 2012-05-06 NOTE — ED Notes (Signed)
Pt reports pain in the left groin that started about 8 days ago. Reports he takes prostate medication. Denies any pain with urination. Reports small amount of swelling to the left groin.

## 2012-05-08 LAB — URINE CULTURE

## 2012-05-09 NOTE — ED Notes (Signed)
+  Urine. Patient treated with Cipro. Sensitive to same. Per protocol MD. °

## 2016-10-30 ENCOUNTER — Encounter: Payer: Self-pay | Admitting: Family Medicine

## 2016-10-30 ENCOUNTER — Ambulatory Visit (INDEPENDENT_AMBULATORY_CARE_PROVIDER_SITE_OTHER): Payer: Medicare Other | Admitting: Family Medicine

## 2016-10-30 VITALS — BP 134/78 | HR 81 | Temp 98.2°F | Ht 67.0 in | Wt 206.8 lb

## 2016-10-30 DIAGNOSIS — I872 Venous insufficiency (chronic) (peripheral): Secondary | ICD-10-CM | POA: Insufficient documentation

## 2016-10-30 DIAGNOSIS — I10 Essential (primary) hypertension: Secondary | ICD-10-CM | POA: Insufficient documentation

## 2016-10-30 DIAGNOSIS — I2510 Atherosclerotic heart disease of native coronary artery without angina pectoris: Secondary | ICD-10-CM | POA: Diagnosis not present

## 2016-10-30 DIAGNOSIS — R5383 Other fatigue: Secondary | ICD-10-CM

## 2016-10-30 DIAGNOSIS — E785 Hyperlipidemia, unspecified: Secondary | ICD-10-CM | POA: Insufficient documentation

## 2016-10-30 DIAGNOSIS — N401 Enlarged prostate with lower urinary tract symptoms: Secondary | ICD-10-CM | POA: Insufficient documentation

## 2016-10-30 DIAGNOSIS — D509 Iron deficiency anemia, unspecified: Secondary | ICD-10-CM | POA: Insufficient documentation

## 2016-10-30 DIAGNOSIS — Z8673 Personal history of transient ischemic attack (TIA), and cerebral infarction without residual deficits: Secondary | ICD-10-CM | POA: Diagnosis not present

## 2016-10-30 DIAGNOSIS — N183 Chronic kidney disease, stage 3 unspecified: Secondary | ICD-10-CM

## 2016-10-30 DIAGNOSIS — Z8679 Personal history of other diseases of the circulatory system: Secondary | ICD-10-CM | POA: Diagnosis not present

## 2016-10-30 DIAGNOSIS — R351 Nocturia: Secondary | ICD-10-CM | POA: Diagnosis not present

## 2016-10-30 DIAGNOSIS — M1A09X0 Idiopathic chronic gout, multiple sites, without tophus (tophi): Secondary | ICD-10-CM | POA: Diagnosis not present

## 2016-10-30 DIAGNOSIS — N1832 Chronic kidney disease, stage 3b: Secondary | ICD-10-CM | POA: Insufficient documentation

## 2016-10-30 DIAGNOSIS — M109 Gout, unspecified: Secondary | ICD-10-CM | POA: Insufficient documentation

## 2016-10-30 MED ORDER — ATORVASTATIN CALCIUM 80 MG PO TABS: 80.0000 mg | ORAL_TABLET | Freq: Every day | ORAL | 3 refills | Status: DC

## 2016-10-30 NOTE — Assessment & Plan Note (Signed)
S: no cause of deficiency in records. Apparently did not see physician until CVA/MI in 2012. No records of colonoscopy. Always cold - has worn coat outside even when its hot for years. Also very tired and sleeps a fair amount though nothing new for him A/P: will get iron panel, cbc, cmp, tsh, as well as stool cards

## 2016-10-30 NOTE — Progress Notes (Signed)
Phone: [phone]  Subjective:  Patient presents today to establish care.  Prior patient of PCP near [place], [place]. Chief complaint-noted.   See problem oriented charting  The following were reviewed and entered/updated in epic: Past Medical History:  Diagnosis Date  . BPH associated with nocturia    flomax 0.4--> 0.8 mg trial. nocturia if has coffee. some incontinence  . CAD (coronary artery disease)    LAD Stent 2012. 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   . Gout    no rx. apparently 1x in past  . History of stroke    no aspirin before stroke. no deficits. slurred words at time of stroke  . Hyperlipidemia   . Hypertension    Patient Active Problem List   Diagnosis Date Noted  . CAD (coronary artery disease)     Priority: High  . History of stroke     Priority: High  . History of complete AV block 10/30/2016    Priority: Medium  . CKD (chronic kidney disease), stage III 10/30/2016    Priority: Medium  . Hypertension     Priority: Medium  . Hyperlipidemia     Priority: Medium  . BPH associated with nocturia     Priority: Medium  . Gout     Priority: Low  . Iron deficiency anemia 10/30/2016  . Venous insufficiency 10/30/2016   Past Surgical History:  Procedure Laterality Date  . CARDIAC SURGERY     Stint  . CORONARY STENT PLACEMENT    . left arm fracture s/p surgery    . right leg fracture s.p surgery- screws like arm      Family History  Problem Relation Age of Onset  . Breast cancer Mother   . Heart disease Father   . Heart disease Brother        x2  . Prostate cancer Brother     Medications- reviewed and updated Current Outpatient Prescriptions  Medication Sig Dispense Refill  . amLODipine (NORVASC) 5 MG tablet Take 5 mg by mouth daily.    Marland Kitchen aspirin 325 MG tablet Take 325 mg by mouth daily.    . famotidine (PEPCID) 20 MG tablet Take 20 mg by mouth 2 (two) times daily.    . ferrous sulfate 325 (65 FE) MG tablet Take 325  mg by mouth 3 (three) times daily with meals as needed. For iron supplement    . isosorbide-hydrALAZINE (BIDIL) 20-37.5 MG per tablet Take 1 tablet by mouth daily.     . metoprolol succinate (TOPROL-XL) 25 MG 24 hr tablet Take 25 mg by mouth daily.    . Tamsulosin HCl (FLOMAX) 0.4 MG CAPS Take 0.4 mg by mouth daily.     Marland Kitchen atorvastatin (LIPITOR) 80 MG tablet Take 1 tablet (80 mg total) by mouth daily. 90 tablet 3   No current facility-administered medications for this visit.     Allergies-reviewed and updated No Known Allergies  Social History   Social History  . Marital status: Married    Spouse name: N/A  . Number of children: N/A  . Years of education: N/A   Social History Main Topics  . Smoking status: Former Smoker    Packs/day: 0.50    Years: 1.00    Types: Cigarettes    Quit date: 05/01/1952  . Smokeless tobacco: Never Used  . Alcohol use No  . Drug use: No  . Sexual activity: Not Asked   Other Topics Concern  . None  Social History Narrative   Married- lives separate from wife. 2 children. Daughter passed from heart attack.       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 Negative for chills, fever and weight loss.  HENT: Negative for hearing loss and tinnitus.   Eyes: Negative for blurred vision and double vision.  Respiratory: Negative for cough and shortness of breath.   Cardiovascular: Negative for chest pain, palpitations and orthopnea.  Gastrointestinal: Negative for heartburn and nausea.  Genitourinary: Negative for dysuria and urgency.  Musculoskeletal: Negative for myalgias and neck pain.  Skin: Negative for itching and rash.  Neurological: Negative for dizziness and headaches.  Endo/Heme/Allergies: Negative for polydipsia. Does not bruise/bleed easily.  Psychiatric/Behavioral: Negative for substance abuse and suicidal  ideas.   Objective: BP 134/78   Pulse 81   Temp 98.2 F (36.8 C) (Oral)   Ht 5\' 7"  (1.702 m)   Wt 206 lb 12.8 oz (93.8 kg)   SpO2 94%   BMI 32.39 kg/m  Gen: NAD, resting comfortably HEENT: Mucous membranes are moist. Oropharynx normal. TM normal. Poor dentition- dentures on top.  Eyes: sclera and lids normal, PERRLA Neck: no thyromegaly, no cervical lymphadenopathy CV: RRR no murmurs rubs or gallops Lungs: CTAB no crackles, wheeze, rhonchi Abdomen: soft/nontender/nondistended/normal bowel sounds. No rebound or guarding.  Ext: 1+ edema L > R  Skin: warm, dry Neuro: 5/5 strength in upper and lower extremities, normal gait, normal reflexes, able to get onto table without ssist  Assessment/Plan:  CAD (coronary artery disease) S: compliant with aspirin. Stent 2012. 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 States was taken off statin by cardiology but no evidence of this. Last cardiology visit from 12/26/13 statesd patient should be taking statin medication from Dr. Deatra Canter. Last referral in 2017 was to Dr. Silas Flood, MD of cardiology with [facility] as patient had not seen cardiology in 2 years.  A/P: will refer to cardiology to be established locally. Continue aspirin, restart atorvastatin 80mg   Hypertension S: controlled on amlodipine 5mg , metoproll ER 25mg , Bidil (only taking once a day- cardiology was ok with this in [place] per daughter and patient).  BP Readings from Last 3 Encounters:  10/30/16 134/78  05/06/12 136/60  A/P: We discussed blood pressure goal of <140/90. Continue current meds as at goal but increase bidil to TID as previously instructed- no record of him being told to take once a day  Hyperlipidemia S: prior on statin. states cardiologist took him off. will review records--> appears was on atorvastatin 80mg  as of mid 2017. From 08/27/15 note "pt states his cholesterol is okay as 'cardiology took me off atorvastatin' butin truth, hes  been advised to get lipidsdone as we dont know what lipid levels are". When on lipitor 80mg  LDL was 66 in 2012 A/P: will restart  Atorvastatin 80mg .   History of complete AV block S: notes show history of complete AV block around time of MI in 2012. Cardiology in 2015 noted this but states had not had issues in over a year and continued him on metoprolol 25mg  XL. No recurrence of block had been noted.  A/P: continue current medication and refer for local cardiology follow up   BPH associated with nocturia S: frequent urination in daytime. Nocturia also bothers him. Compliant with flomax 0.4mg . Apparently referred to urology in  2014 after UTI discovered. BPH on flomax A/P: continue current medications- may increase to  2tablets a day as long as no orthostasis.Marland Kitchendid not have record of urology visit so will request this from patient/daughter.  Apparently there was a question about surgery being needed- no clear evidence of this in record and patient states he has no interest in surgery despite his polyuria.    Venous insufficiency S: review of records show consistently patient being told to use compressoin stockings which he was noncompliant with. Echo 2012 did show "advanced diastolic dysfunction" A/P: will reinforce next visit or prescribe new rx for compression stockins  CKD (chronic kidney disease), stage III S: Dialysis x1 in 2012 around time of CVA and MI. By time of discharge creatinine down to 1.5 A/P: update bmet today  Iron deficiency anemia S: no cause of deficiency in records. Apparently did not see physician until CVA/MI in 2012. No records of colonoscopy. Always cold - has worn coat outside even when its hot for years. Also very tired and sleeps a fair amount though nothing new for him A/P: will get iron panel, cbc, cmp, tsh, as well as stool cards  Advised 4 month follow up. see follow up message to [facility] routed  Orders Placed This Encounter  Procedures  . CBC    [place]  .  Comprehensive metabolic panel    [place]  . Iron, TIBC and Ferritin Panel  . TSH    [place]  . LDL cholesterol, direct    [place]  . Ambulatory referral to Cardiology    Referral Priority:   Routine    Referral Type:   Consultation    Referral Reason:   Specialty Services Required    Requested Specialty:   Cardiology    Number of Visits Requested:   1  . POC Hemoccult Bld/Stl (3-Cd Home Screen)    Send home    Standing Status:   Future    Standing Expiration Date:   10/30/2017   Meds ordered this encounter  Medications  . atorvastatin (LIPITOR) 80 MG tablet    Sig: Take 1 tablet (80 mg total) by mouth daily.    Dispense:  90 tablet    Refill:  3   The duration of face-to-face time during this visit was greater than 45 minutes. Greater than 50% of this time was spent in counseling, explanation of diagnosis, planning of further management, and/or coordination of care including discussion of past history, review of history verbally and through faxed information, discussion offollow up steps including labs and referrals. .    Return precautions advised. Garret Reddish, MD

## 2016-10-30 NOTE — Assessment & Plan Note (Signed)
S: review of records show consistently patient being told to use compressoin stockings which he was noncompliant with. Echo 2012 did show "advanced diastolic dysfunction" A/P: will reinforce next visit or prescribe new rx for compression stockins

## 2016-10-30 NOTE — Assessment & Plan Note (Addendum)
S: prior on statin. states cardiologist took him off. will review records--> appears was on atorvastatin 80mg  as of mid 2017. From 08/27/15 note "pt states his cholesterol is okay as 'cardiology took me off atorvastatin' butin truth, hes been advised to get lipidsdone as we dont know what lipid levels are". When on lipitor 80mg  LDL was 66 in 2012 A/P: will restart  Atorvastatin 80mg .

## 2016-10-30 NOTE — Assessment & Plan Note (Addendum)
S: notes show history of complete AV block around time of MI in 2012. Cardiology in 2015 noted this but states had not had issues in over a year and continued him on metoprolol 25mg  XL. No recurrence of block had been noted.  A/P: continue current medication and refer for local cardiology follow up

## 2016-10-30 NOTE — Patient Instructions (Addendum)
Great to meet you!   Please stop by lab before you go  We will call you within a week or two about your referral to cardiology. If you do not hear within 3 weeks, give [place] a call.   I will review records and see if I need additional medication.   Since you are getting acclimated down here- lets plan on 4 month follow up  ______________________________________________________________________  Starting October 1st 2018, I will be transferring to our new location: [facility] (corner of [address] and [address] from [employer]) [place], [place] Phone: [phone]  I would love to have you remain my patient at this new location as long as it remains convenient for you. I am excited about the opportunity to have x-ray and sports medicine in the new building but will really miss the awesome staff and physicians at [facility]. Continue to schedule appointments at [facility] and we will automatically transfer them to the [place] location starting October 1st.

## 2016-10-30 NOTE — Assessment & Plan Note (Addendum)
S: compliant with aspirin. Stent 2012. 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 States was taken off statin by cardiology but no evidence of this. Last cardiology visit from 12/26/13 statesd patient should be taking statin medication from Dr. Deatra Canter. Last referral in 2017 was to Dr. Silas Flood, MD of cardiology with [facility] as patient had not seen cardiology in 2 years.  A/P: will refer to cardiology to be established locally. Continue aspirin, restart atorvastatin 80mg 

## 2016-10-30 NOTE — Assessment & Plan Note (Addendum)
S: frequent urination in daytime. Nocturia also bothers him. Compliant with flomax 0.4mg . Apparently referred to urology in 2014 after UTI discovered. BPH on flomax A/P: continue current medications- may increase to  2tablets a day as long as no orthostasis.Marland Kitchendid not have record of urology visit so will request this from patient/daughter.  Apparently there was a question about surgery being needed- no clear evidence of this in record and patient states he has no interest in surgery despite his polyuria.

## 2016-10-30 NOTE — Assessment & Plan Note (Addendum)
S: controlled on amlodipine 5mg , metoproll ER 25mg , Bidil (only taking once a day- cardiology was ok with this in [place] per daughter and patient).  BP Readings from Last 3 Encounters:  10/30/16 134/78  05/06/12 136/60  A/P: We discussed blood pressure goal of <140/90. Continue current meds as at goal but increase bidil to TID as previously instructed- no record of him being told to take once a day

## 2016-10-30 NOTE — Assessment & Plan Note (Signed)
S: Dialysis x1 in 2012 around time of CVA and MI. By time of discharge creatinine down to 1.5 A/P: update bmet today

## 2016-10-31 LAB — CBC
HCT: 43.3 % (ref 39.0–52.0)
Hemoglobin: 14.3 g/dL (ref 13.0–17.0)
MCHC: 33.1 g/dL (ref 30.0–36.0)
MCV: 93.8 fl (ref 78.0–100.0)
Platelets: 215 10*3/uL (ref 150.0–400.0)
RBC: 4.61 Mil/uL (ref 4.22–5.81)
RDW: 13.5 % (ref 11.5–15.5)
WBC: 4.9 10*3/uL (ref 4.0–10.5)

## 2016-10-31 LAB — LDL CHOLESTEROL, DIRECT: Direct LDL: 141 mg/dL

## 2016-10-31 LAB — COMPREHENSIVE METABOLIC PANEL
ALT: 7 U/L (ref 0–53)
AST: 15 U/L (ref 0–37)
Albumin: 4.5 g/dL (ref 3.5–5.2)
Alkaline Phosphatase: 86 U/L (ref 39–117)
BUN: 13 mg/dL (ref 6–23)
CHLORIDE: 103 meq/L (ref 96–112)
CO2: 27 meq/L (ref 19–32)
CREATININE: 1.43 mg/dL (ref 0.40–1.50)
Calcium: 9.3 mg/dL (ref 8.4–10.5)
GFR: 60.52 mL/min (ref >60.00)
Glucose, Bld: 111 mg/dL — ABNORMAL HIGH (ref 70–99)
Potassium: 4 mEq/L (ref 3.5–5.1)
Sodium: 138 mEq/L (ref 135–145)
Total Bilirubin: 1 mg/dL (ref 0.2–1.2)
Total Protein: 7.6 g/dL (ref 6.0–8.3)

## 2016-10-31 LAB — TSH: TSH: 2.05 u[IU]/mL (ref 0.35–4.50)

## 2016-10-31 LAB — IRON,TIBC AND FERRITIN PANEL
%SAT: 29 % (ref 15–60)
Ferritin: 905 ng/mL — ABNORMAL HIGH (ref 20–380)
IRON: 72 ug/dL (ref 50–180)
TIBC: 247 ug/dL — AB (ref 250–425)

## 2016-11-07 ENCOUNTER — Telehealth: Payer: Self-pay

## 2016-11-07 MED ORDER — ISOSORB DINITRATE-HYDRALAZINE 20-37.5 MG PO TABS: 1.0000 | ORAL_TABLET | Freq: Three times a day (TID) | ORAL | 1 refills | Status: DC

## 2016-11-07 NOTE — Telephone Encounter (Signed)
Daughter called back and we reviewed over lab results. I placed a copy in the mail at her request. She stated [facility] had called yesterday and left a message for her dad. She was returning their phone call. She verbalized understanding regarding the atorvastatin and the Bidil. She states she will call urology and request records and that her father has not had a colonoscopy that she knows of.

## 2016-11-07 NOTE — Telephone Encounter (Signed)
Called and left a detailed voicemail message asking for a return phone call regarding:  1. He should restart atorvastatin 80mg . Cardiology or primary care never said stop this- I sent this in. I sent this in  2. Bidil blood pressure medicine should be 3x a day- no one ever told him to reduce the dose that I can tell from records. You can send this in as 3x a day to his local pharmacy  3. No records from urology were in records. I was able to see cardiology and primary care records. Can they request records please?  4. Has he never had a colonoscopy? No clear cause of where his blood loss came from.

## 2016-11-16 ENCOUNTER — Telehealth: Payer: Self-pay | Admitting: Family Medicine

## 2016-11-16 NOTE — Telephone Encounter (Signed)
Yes thanks May fill all

## 2016-11-16 NOTE — Telephone Encounter (Signed)
Pts daughter would like to see if the Rx for atorvastatin to be resent to [facility] on [address] and [address].  Pt need new Rx for tamsulosin bid #90 and to get a few nitroglycerin until he is able to go to the cardiologist pt only uses it for emergency.

## 2016-11-17 ENCOUNTER — Other Ambulatory Visit: Payer: Self-pay

## 2016-11-17 ENCOUNTER — Other Ambulatory Visit: Payer: Self-pay | Admitting: Family Medicine

## 2016-11-17 ENCOUNTER — Encounter: Payer: Self-pay | Admitting: Family Medicine

## 2016-11-17 DIAGNOSIS — E785 Hyperlipidemia, unspecified: Secondary | ICD-10-CM

## 2016-11-17 MED ORDER — TAMSULOSIN HCL 0.4 MG PO CAPS: 0.4000 mg | ORAL_CAPSULE | Freq: Every day | ORAL | 1 refills | Status: DC

## 2016-11-17 MED ORDER — NITROGLYCERIN 0.4 MG SL SUBL: 0.4000 mg | SUBLINGUAL_TABLET | SUBLINGUAL | 0 refills | Status: AC | PRN

## 2016-11-17 MED ORDER — ATORVASTATIN CALCIUM 80 MG PO TABS: 80.0000 mg | ORAL_TABLET | Freq: Every day | ORAL | 3 refills | Status: DC

## 2016-11-17 NOTE — Addendum Note (Signed)
Addended by: Marin Olp on: 11/17/2016 05:37 PM   Modules accepted: Orders

## 2016-11-17 NOTE — Telephone Encounter (Signed)
Please advise on dosage and quantity of Nitro

## 2016-11-17 NOTE — Telephone Encounter (Signed)
Atorvastatin and Tamsulosin prescriptions sent to pharmacy

## 2016-11-17 NOTE — Telephone Encounter (Signed)
I sent this in and sent mychart message

## 2016-12-22 ENCOUNTER — Encounter: Payer: Self-pay | Admitting: Interventional Cardiology

## 2016-12-22 ENCOUNTER — Ambulatory Visit (INDEPENDENT_AMBULATORY_CARE_PROVIDER_SITE_OTHER): Payer: Medicare Other | Admitting: Interventional Cardiology

## 2016-12-22 VITALS — BP 140/58 | HR 83 | Ht 67.0 in | Wt 208.4 lb

## 2016-12-22 DIAGNOSIS — I2510 Atherosclerotic heart disease of native coronary artery without angina pectoris: Secondary | ICD-10-CM

## 2016-12-22 DIAGNOSIS — N183 Chronic kidney disease, stage 3 unspecified: Secondary | ICD-10-CM

## 2016-12-22 DIAGNOSIS — Z8673 Personal history of transient ischemic attack (TIA), and cerebral infarction without residual deficits: Secondary | ICD-10-CM | POA: Diagnosis not present

## 2016-12-22 DIAGNOSIS — I10 Essential (primary) hypertension: Secondary | ICD-10-CM | POA: Diagnosis not present

## 2016-12-22 DIAGNOSIS — E782 Mixed hyperlipidemia: Secondary | ICD-10-CM | POA: Diagnosis not present

## 2016-12-22 MED ORDER — ASPIRIN EC 325 MG PO TBEC: 162.0000 mg | DELAYED_RELEASE_TABLET | Freq: Every day | ORAL | 0 refills | Status: DC

## 2016-12-22 NOTE — Patient Instructions (Signed)
Medication Instructions:  Decrease Aspirin to a half a tablet (162 mg) daily.   Labwork: None ordered  Testing/Procedures: None ordered  Follow-Up: Your physician wants you to follow-up in: 6 months with Dr. Irish Lack. You will receive a reminder letter in the mail two months in advance. If you don't receive a letter, please call our office to schedule the follow-up appointment.   Any Other Special Instructions Will Be Listed Below (If Applicable).     If you need a refill on your cardiac medications before your next appointment, please call your pharmacy.

## 2016-12-22 NOTE — Progress Notes (Signed)
Cardiology Office Note   Date:  12/22/2016   ID:  Tony [name], Tony [name] 08-14-1930, MRN [id number]  PCP:  Marin Olp, MD    Chief Complaint  Patient presents with  . New Patient (Initial Visit)   CAD  Wt Readings from Last 3 Encounters:  12/22/16 208 lb 6.4 oz (94.5 kg)  10/30/16 206 lb 12.8 oz (93.8 kg)       History of Present Illness: Tony [name] is a 83 y.o. male  Who had an MI and stroke in 2012.  He had a stent placed in the LAD.  Angina was an unusual feeling- "felt sick."  He would recognize it if it happened again.  He had some other blockage that had formed collaterals.  Medical treatment for the second blockage.    Since then, no chest pain.  No repeat cath.  Denies : Chest pain. Dizziness. Leg edema. Nitroglycerin use. Orthopnea. Palpitations. Paroxysmal nocturnal dyspnea. Shortness of breath. Syncope.   Occasionally he feels sluggish in the morning.  He sleeps a lot, napping during the day.    No bleeding problems.  No residual weakness.  He had transient renal failure, but kidneys recovered.      Past Medical History:  Diagnosis Date  . BPH associated with nocturia    flomax 0.4--> 0.8 mg trial. nocturia if has coffee. some incontinence  . CAD (coronary artery disease)    LAD Stent 2012. 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   . Gout    no rx. apparently 1x in past  . History of stroke    no aspirin before stroke. no deficits. slurred words at time of stroke  . Hyperlipidemia   . Hypertension     Past Surgical History:  Procedure Laterality Date  . CARDIAC SURGERY     Stint  . CORONARY STENT PLACEMENT    . left arm fracture s/p surgery    . right leg fracture s.p surgery- screws like arm       Current Outpatient Prescriptions  Medication Sig Dispense Refill  . amLODipine (NORVASC) 5 MG tablet Take 5 mg by mouth daily.    Marland Kitchen aspirin 325 MG tablet Take 325 mg by mouth daily.    Marland Kitchen atorvastatin (LIPITOR) 80  MG tablet Take 1 tablet (80 mg total) by mouth daily. 90 tablet 3  . isosorbide-hydrALAZINE (BIDIL) 20-37.5 MG tablet Take 1 tablet by mouth 3 (three) times daily. 270 tablet 1  . metoprolol succinate (TOPROL-XL) 25 MG 24 hr tablet Take 25 mg by mouth daily.    . nitroGLYCERIN (NITROSTAT) 0.4 MG SL tablet Place 1 tablet (0.4 mg total) under the tongue every 5 (five) minutes as needed for chest pain (3 maximum before seeking care). 30 tablet 0  . tamsulosin (FLOMAX) 0.4 MG CAPS capsule Take 1 capsule (0.4 mg total) by mouth daily. 90 capsule 1   No current facility-administered medications for this visit.     Allergies:   Patient has no known allergies.    Social History:  The patient  reports that he quit smoking about 64 years ago. His smoking use included Cigarettes. He has a 0.50 pack-year smoking history. He has never used smokeless tobacco. He reports that he does not drink alcohol or use drugs.   Family History:  The patient's family history includes Breast cancer in his mother; Heart disease in his brother and father; Prostate cancer in his brother.    ROS:  Please see the history of present illness.   Otherwise, review of systems are positive for decreased stamina.   All other systems are reviewed and negative.    PHYSICAL EXAM: VS:  BP (!) 140/58   Pulse 83   Ht 5\' 7"  (1.702 m)   Wt 208 lb 6.4 oz (94.5 kg)   BMI 32.64 kg/m  , BMI Body mass index is 32.64 kg/m. GEN: Well nourished, well developed, in no acute distress  HEENT: normal  Neck: no JVD, carotid bruits, or masses Cardiac: RRR, premature beats; no murmurs, rubs, or gallops,; tr bilateral lower extremityedema  Respiratory:  clear to auscultation bilaterally, normal work of breathing GI: soft, nontender, nondistended, + BS MS: no deformity or atrophy  Skin: warm and dry, no rash Neuro:  Strength and sensation are intact Psych: euthymic mood, full affect   EKG:   The ekg ordered today demonstrates NSR, inferior  Q waves and TWI, PACs, PVCs   Recent Labs: 10/30/2016: ALT 7; BUN 13; Creatinine, Ser 1.43; Hemoglobin 14.3; Platelets 215.0; Potassium 4.0; Sodium 138; TSH 2.05   Lipid Panel    Component Value Date/Time   LDLDIRECT 141.0 10/30/2016 1528     Other studies Reviewed: Additional studies/ records that were reviewed today with results demonstrating: Cath report reviewed from 2012.  Severe LAD disease.  CTO prox RCA.  Possible CTO distal circ.Marland Kitchen  LVEF 55% in 2012. 2013 stress shows inferior ischemia. Normal LV function.   ASSESSMENT AND PLAN:  1. CAD/Old MI : LAD stent placed in 2012. Known RCA CTO with left to right collaterals. No angina on medical therapy. No indication for repeat cardiac cath.  Decrease aspirin dose to 162 mg daily,  since he already has a lot of adult aspirins at home.  2. Hyperlipidemia: Atorvastatin recently started for LDL at 141. 3. Increase exercise to help stamina. 4. HTN: Check at home.  Increase BiDil to TID.  He has only been taking it once a day of late.   5. CRI: Around 1.5 baseline creatinine. Like to avoid any procedures that involve contrast if possible.   Current medicines are reviewed at length with the patient today.  The patient concerns regarding his medicines were addressed.  The following changes have been made:  No change  Labs/ tests ordered today include:  No orders of the defined types were placed in this encounter.   Recommend 150 minutes/week of aerobic exercise Low fat, low carb, high fiber diet recommended  Disposition:   FU in 1 year   Signed, Larae Grooms, MD  12/22/2016 2:33 PM    [facility] [address], [place], [place]  [address] Phone: [phone]; Fax: [phone]

## 2017-01-23 ENCOUNTER — Telehealth: Payer: Self-pay | Admitting: Family Medicine

## 2017-01-23 ENCOUNTER — Telehealth: Payer: Self-pay | Admitting: Interventional Cardiology

## 2017-01-23 ENCOUNTER — Other Ambulatory Visit: Payer: Self-pay

## 2017-01-23 MED ORDER — METOPROLOL SUCCINATE ER 25 MG PO TB24: 25.0000 mg | ORAL_TABLET | Freq: Every day | ORAL | 3 refills | Status: DC

## 2017-01-23 MED ORDER — AMLODIPINE BESYLATE 5 MG PO TABS: 5.0000 mg | ORAL_TABLET | Freq: Every day | ORAL | 3 refills | Status: DC

## 2017-01-23 NOTE — Telephone Encounter (Signed)
Pt need new Rx for metoprolol succinate ER 25 MG one a day #90 and amlodipine 5 MG one a day #90  Pharm: [facility] on [address].

## 2017-01-23 NOTE — Telephone Encounter (Signed)
New message    Pt c/o medication issue:  1. Name of Medication: aspirin  2. How are you currently taking this medication (dosage and times per day)? Half tab daily  3. Are you having a reaction (difficulty breathing--STAT)? no  4. What is your medication issue? Pt daughter said at his last visit he was told to take half a tab of his aspirin. She wants to know if he can take two baby aspirin because it adds up to the same. Please call.

## 2017-01-23 NOTE — Telephone Encounter (Signed)
Prescriptions sent to pharmacy as requested 

## 2017-01-23 NOTE — Telephone Encounter (Signed)
Patient's daughter calling and states that patient has been taking ASA 162 mg QD. Patient has been taking a 1/2 of the adult ASA and is almost out. She wants to know if he should continue to take 162 mg or if he can decrease it to taking ASA 81 mg QD now. Daughter states that the baby ASA is cheaper. Please advise.

## 2017-01-24 NOTE — Telephone Encounter (Signed)
ASA 81 mg daily is fine to continue

## 2017-01-25 NOTE — Telephone Encounter (Signed)
Left message for patient's daughter [name] to call back.

## 2017-01-29 MED ORDER — ASPIRIN EC 81 MG PO TBEC: 81.0000 mg | DELAYED_RELEASE_TABLET | Freq: Every day | ORAL | 3 refills | Status: AC

## 2017-01-29 NOTE — Telephone Encounter (Signed)
Daughter made aware that Dr. Irish Lack was okay with the patient taking ASA 81 mg daily. She verbalized understanding and thanked me for the call.

## 2017-05-25 ENCOUNTER — Ambulatory Visit: Payer: Medicare Other | Admitting: Family Medicine

## 2017-05-25 DIAGNOSIS — Z0289 Encounter for other administrative examinations: Secondary | ICD-10-CM

## 2017-06-07 ENCOUNTER — Other Ambulatory Visit: Payer: Self-pay | Admitting: Family Medicine

## 2017-06-20 ENCOUNTER — Encounter: Payer: Self-pay | Admitting: Family Medicine

## 2017-06-20 ENCOUNTER — Ambulatory Visit (INDEPENDENT_AMBULATORY_CARE_PROVIDER_SITE_OTHER): Payer: Medicare Other | Admitting: Family Medicine

## 2017-06-20 VITALS — BP 136/70 | HR 87 | Temp 97.3°F | Ht 67.0 in | Wt 203.0 lb

## 2017-06-20 DIAGNOSIS — I10 Essential (primary) hypertension: Secondary | ICD-10-CM | POA: Diagnosis not present

## 2017-06-20 DIAGNOSIS — R351 Nocturia: Secondary | ICD-10-CM

## 2017-06-20 DIAGNOSIS — D509 Iron deficiency anemia, unspecified: Secondary | ICD-10-CM | POA: Diagnosis not present

## 2017-06-20 DIAGNOSIS — E785 Hyperlipidemia, unspecified: Secondary | ICD-10-CM

## 2017-06-20 DIAGNOSIS — N401 Enlarged prostate with lower urinary tract symptoms: Secondary | ICD-10-CM

## 2017-06-20 DIAGNOSIS — I872 Venous insufficiency (chronic) (peripheral): Secondary | ICD-10-CM | POA: Diagnosis not present

## 2017-06-20 NOTE — Assessment & Plan Note (Signed)
S: poorly controlled on last check off atorvastatin 80mg - we advised resstart last visit. No myalgias.  Lab Results  Component Value Date   LDLDIRECT 141.0 10/30/2016   A/P: Patient has been compliant with statin.  He wants to wait until his physical to repeat blood work.

## 2017-06-20 NOTE — Assessment & Plan Note (Signed)
S:  patient was advised could go to flomax 0.8mg  instead of 0.4mg  due to frequent urination in daytime. He has not tried the 2 a day dose- so not sure about orthostasis A/P: ok to try 1-2 tablets a day of flomax- watch for orthostatic symptoms

## 2017-06-20 NOTE — Assessment & Plan Note (Signed)
S: Patient has been told iron deficient in the past and anemic. On last labs here there were no signs of anemia. A/P: We agreed to only pursue stool cards or further workup if anemic again.

## 2017-06-20 NOTE — Progress Notes (Signed)
Subjective:  Tony [name] is a 84 y.o. year old very pleasant male patient who presents for/with See problem oriented charting ROS- No chest pain or shortness of breath. No headache or blurry vision.  Does have some back pain particularly with travel.  Past Medical History-  Patient Active Problem List   Diagnosis Date Noted  . CAD (coronary artery disease)     Priority: High  . History of stroke     Priority: High  . History of complete AV block 10/30/2016    Priority: Medium  . CKD (chronic kidney disease), stage III ([place]) 10/30/2016    Priority: Medium  . Hypertension     Priority: Medium  . Hyperlipidemia     Priority: Medium  . BPH associated with nocturia     Priority: Medium  . Venous insufficiency 10/30/2016    Priority: Low  . Gout     Priority: Low  . Iron deficiency anemia 10/30/2016    Medications- reviewed and updated Current Outpatient Medications  Medication Sig Dispense Refill  . amLODipine (NORVASC) 5 MG tablet Take 1 tablet (5 mg total) by mouth daily. 90 tablet 3  . aspirin EC 81 MG tablet Take 1 tablet (81 mg total) by mouth daily. 90 tablet 3  . atorvastatin (LIPITOR) 80 MG tablet Take 1 tablet (80 mg total) by mouth daily. 90 tablet 3  . isosorbide-hydrALAZINE (BIDIL) 20-37.5 MG tablet Take 1 tablet by mouth 3 (three) times daily. 270 tablet 1  . metoprolol succinate (TOPROL-XL) 25 MG 24 hr tablet Take 1 tablet (25 mg total) by mouth daily. 90 tablet 3  . nitroGLYCERIN (NITROSTAT) 0.4 MG SL tablet Place 1 tablet (0.4 mg total) under the tongue every 5 (five) minutes as needed for chest pain (3 maximum before seeking care). 30 tablet 0  . tamsulosin (FLOMAX) 0.4 MG CAPS capsule TAKE 1 CAPSULE(0.4 MG) BY MOUTH DAILY 90 capsule 1   No current facility-administered medications for this visit.     Objective: BP 136/70   Pulse 87   Temp (!) 97.3 F (36.3 C) (Oral)   Ht 5\' 7"  (1.702 m)   Wt 203 lb (92.1 kg)   BMI 31.79 kg/m  Gen: NAD, resting  comfortably CV: RRR no murmurs rubs or gallops Lungs: CTAB no crackles, wheeze, rhonchi Abdomen: soft/nontender/nondistended/normal bowel sounds. obese Ext: 1+ edema- not wearing compression stockings Skin: warm, dry  Assessment/Plan:  See after visit summary- we discussed slowly increasing exercise as long as asymptomatic  Hypertension S: controlled poorly on amlodipine 5mg , metoprolol 25mg  BID, Bidil- had advised to go to TID (he is still doing once a day) BP Readings from Last 3 Encounters:  06/20/17 136/70  12/22/16 (!) 140/58  10/30/16 134/78  A/P: We discussed blood pressure goal of <140/90. Continue current meds: Once again advised BIdil to be 3 times daily instead of once daily  Hyperlipidemia S: poorly controlled on last check off atorvastatin 80mg - we advised resstart last visit. No myalgias.  Lab Results  Component Value Date   LDLDIRECT 141.0 10/30/2016   A/P: Patient has been compliant with statin.  He wants to wait until his physical to repeat blood work.  BPH associated with nocturia S:  patient was advised could go to flomax 0.8mg  instead of 0.4mg  due to frequent urination in daytime. He has not tried the 2 a day dose- so not sure about orthostasis A/P: ok to try 1-2 tablets a day of flomax- watch for orthostatic symptoms  Venous insufficiency S:  has been advised compression stockings in past- he has started using A/P: He states he has compression stockings but is not using today.  I encouraged him to use these regularly  Iron deficiency anemia S: Patient has been told iron deficient in the past and anemic. On last labs here there were no signs of anemia. A/P: We agreed to only pursue stool cards or further workup if anemic again.   Return in about 6 months (around 12/18/2017) for physical, come fasting. get early morning visit.  Return precautions advised.  Garret Reddish, MD

## 2017-06-20 NOTE — Patient Instructions (Addendum)
No changes today  Glad you are doing well  Try to walk at least 5 minutes a day as long as no chest pain or shortness of breath. Then continue to build- by follow up my hope is you can do 15-20 minutes walking at a time.

## 2017-06-20 NOTE — Assessment & Plan Note (Signed)
S: controlled poorly on amlodipine 5mg , metoprolol 25mg  BID, Bidil- had advised to go to TID (he is still doing once a day) BP Readings from Last 3 Encounters:  06/20/17 136/70  12/22/16 (!) 140/58  10/30/16 134/78  A/P: We discussed blood pressure goal of <140/90. Continue current meds: Once again advised BIdil to be 3 times daily instead of once daily

## 2017-06-20 NOTE — Assessment & Plan Note (Signed)
S: has been advised compression stockings in past- he has started using A/P: He states he has compression stockings but is not using today.  I encouraged him to use these regularly

## 2017-07-06 ENCOUNTER — Encounter: Payer: Self-pay | Admitting: Family Medicine

## 2017-07-06 ENCOUNTER — Ambulatory Visit (INDEPENDENT_AMBULATORY_CARE_PROVIDER_SITE_OTHER): Payer: Medicare Other

## 2017-07-06 ENCOUNTER — Other Ambulatory Visit (INDEPENDENT_AMBULATORY_CARE_PROVIDER_SITE_OTHER): Payer: Medicare Other

## 2017-07-06 ENCOUNTER — Ambulatory Visit (INDEPENDENT_AMBULATORY_CARE_PROVIDER_SITE_OTHER): Payer: Medicare Other | Admitting: Family Medicine

## 2017-07-06 VITALS — BP 132/74 | HR 83 | Temp 98.0°F | Ht 67.0 in | Wt 194.2 lb

## 2017-07-06 DIAGNOSIS — M549 Dorsalgia, unspecified: Secondary | ICD-10-CM

## 2017-07-06 DIAGNOSIS — M79604 Pain in right leg: Secondary | ICD-10-CM

## 2017-07-06 DIAGNOSIS — M79605 Pain in left leg: Secondary | ICD-10-CM | POA: Diagnosis not present

## 2017-07-06 DIAGNOSIS — M47816 Spondylosis without myelopathy or radiculopathy, lumbar region: Secondary | ICD-10-CM | POA: Diagnosis not present

## 2017-07-06 LAB — COMPREHENSIVE METABOLIC PANEL
ALBUMIN: 3.9 g/dL (ref 3.5–5.2)
ALT: 10 U/L (ref 0–53)
AST: 16 U/L (ref 0–37)
Alkaline Phosphatase: 819 U/L — ABNORMAL HIGH (ref 39–117)
BUN: 18 mg/dL (ref 6–23)
CALCIUM: 9.2 mg/dL (ref 8.4–10.5)
CHLORIDE: 106 meq/L (ref 96–112)
CO2: 24 mEq/L (ref 19–32)
CREATININE: 1.83 mg/dL — AB (ref 0.40–1.50)
GFR: 45.46 mL/min — ABNORMAL LOW (ref >60.00)
Glucose, Bld: 126 mg/dL — ABNORMAL HIGH (ref 70–99)
Potassium: 4.2 mEq/L (ref 3.5–5.1)
Sodium: 139 mEq/L (ref 135–145)
Total Bilirubin: 1 mg/dL (ref 0.2–1.2)
Total Protein: 7 g/dL (ref 6.0–8.3)

## 2017-07-06 LAB — CBC
HEMATOCRIT: 25.8 % — AB (ref 39.0–52.0)
Hemoglobin: 8.2 g/dL — ABNORMAL LOW (ref 13.0–17.0)
MCHC: 31.7 g/dL (ref 30.0–36.0)
MCV: 102.7 fl — AB (ref 78.0–100.0)
Platelets: 188 10*3/uL (ref 150.0–400.0)
RBC: 2.51 Mil/uL — ABNORMAL LOW (ref 4.22–5.81)
RDW: 19.6 % — AB (ref 11.5–15.5)
WBC: 6.4 10*3/uL (ref 4.0–10.5)

## 2017-07-06 LAB — TSH: TSH: 2.63 u[IU]/mL (ref 0.35–4.50)

## 2017-07-06 LAB — VITAMIN B12: Vitamin B-12: 229 pg/mL (ref 211–911)

## 2017-07-06 LAB — CK: Total CK: 60 U/L (ref 7–232)

## 2017-07-06 MED ORDER — PREDNISONE 20 MG PO TABS: 20.0000 mg | ORAL_TABLET | Freq: Every day | ORAL | 0 refills | Status: DC

## 2017-07-06 NOTE — Progress Notes (Signed)
    Subjective:  Tony [name] is a 84 y.o. male who presents today for same-day appointment with a chief complaint of bilateral leg pain.   HPI:  Bilateral leg pain, new issue Symptoms started 4 weeks ago.  Worsened over that time.  Pain is progressed the point now where he has to use a cane to walk.  His daughter is with him who also provides some history.  Pain is worse in his right leg compared to his left leg.  Pain mostly located in the anterior and lateral aspects of his thighs bilaterally.  No calf pain.  No weakness.  No fevers or chills.  He has lost about 10 pounds of weight unintentionally over the past few weeks.  No history of trauma, falls, or other obvious precipitating events.  He has tried taking Advil which helps a little bit.  No hematuria.  No dysuria.  No dark colored urine.  No recent medication changes.  Also has had some lower back pain over the last few weeks.  Pain worse with movement and with climbing stairs.  Denies any paresthesias.  No numbness or tingling sensation.  No bowel or bladder incontinence.  ROS: Per HPI  PMH: He reports that he quit smoking about 65 years ago. His smoking use included cigarettes. He has a 0.50 pack-year smoking history. he has never used smokeless tobacco. He reports that he does not drink alcohol or use drugs.  Objective:  Physical Exam: BP 132/74 (BP Location: Left Arm, Patient Position: Sitting, Cuff Size: Normal)   Pulse 83   Temp 98 F (36.7 C) (Oral)   Ht 5\' 7"  (1.702 m)   Wt 194 lb 3.2 oz (88.1 kg)   SpO2 97%   BMI 30.42 kg/m   Gen: Very pleasant 84 year old man in no acute distress CV: RRR with no murmurs appreciated Pulm: NWOB, CTAB with no crackles, wheezes, or rhonchi MSK:  -Back: No deformities.  Tender to palpation along lumbar spine.  No spinous process tenderness. -Right lower extremity: No deformities.  Strength 5 out of 5 throughout.  Sensation light touch intact throughout.  Nontender to palpation.  Patellar  reflex 2+.  Straight leg raise negative. -Left lower extremity: No deformities.  Strength 5 out of 5 throughout.  Sensation light touch intact throughout.  Nontender to palpation.  Patellar reflex 2+.  Straight leg raise negative.  Assessment/Plan:  Bilateral leg pain His exam is relatively benign today.  We obtained a plain film of his lower back which shows a little bit of osteophytic changes in his lumbar spine but no major abnormalities based on my read.  We will await radiology read.  It is possible that he could be having statin induced myopathy.  We will stop his atorvastatin for the time being.  We will check CMET, CBC, CK, TSH, and vitamin B12 level.  It is possible that he could be having some radicular symptoms however his straight leg raise was negative bilaterally.  We will start him on low-dose prednisone for the next week to help with this.  He is not a candidate for NSAIDs due to his history of coronary artery disease and chronic kidney disease.  Discussed return precautions and reasons to seek emergency care.  If above workup negative and symptoms not improving on prednisone, will likely need advanced imaging such as lumbar MRI.  [name]. [name], MD 07/06/2017 3:15 PM

## 2017-07-06 NOTE — Patient Instructions (Addendum)
Please start the prednisone and stop the lipitor.   We will let you know when the blood work comes back.  We may need to get an MRI of your back if your symptoms do not improving.   Take care, Dr [name]

## 2017-07-06 NOTE — Addendum Note (Signed)
Addended by: Kayren Eaves T on: 07/06/2017 03:17 PM   Modules accepted: Orders

## 2017-07-09 ENCOUNTER — Other Ambulatory Visit: Payer: Self-pay | Admitting: Family Medicine

## 2017-07-09 DIAGNOSIS — D539 Nutritional anemia, unspecified: Secondary | ICD-10-CM

## 2017-07-09 DIAGNOSIS — R748 Abnormal levels of other serum enzymes: Secondary | ICD-10-CM

## 2017-07-09 NOTE — Progress Notes (Signed)
Called and spoke with daughter. Patient is scheduled for lab appointment 07/10/17

## 2017-07-10 ENCOUNTER — Other Ambulatory Visit (INDEPENDENT_AMBULATORY_CARE_PROVIDER_SITE_OTHER): Payer: Medicare Other

## 2017-07-10 DIAGNOSIS — D539 Nutritional anemia, unspecified: Secondary | ICD-10-CM | POA: Diagnosis not present

## 2017-07-10 DIAGNOSIS — R748 Abnormal levels of other serum enzymes: Secondary | ICD-10-CM | POA: Diagnosis not present

## 2017-07-10 LAB — COMPREHENSIVE METABOLIC PANEL WITH GFR
ALT: 14 U/L (ref 0–53)
AST: 14 U/L (ref 0–37)
Albumin: 3.9 g/dL (ref 3.5–5.2)
Alkaline Phosphatase: 747 U/L — ABNORMAL HIGH (ref 39–117)
BUN: 26 mg/dL — ABNORMAL HIGH (ref 6–23)
CO2: 26 meq/L (ref 19–32)
Calcium: 8.6 mg/dL (ref 8.4–10.5)
Chloride: 105 meq/L (ref 96–112)
Creatinine, Ser: 1.79 mg/dL — ABNORMAL HIGH (ref 0.40–1.50)
GFR: 46.63 mL/min — ABNORMAL LOW
Glucose, Bld: 99 mg/dL (ref 70–99)
Potassium: 4.1 meq/L (ref 3.5–5.1)
Sodium: 139 meq/L (ref 135–145)
Total Bilirubin: 0.8 mg/dL (ref 0.2–1.2)
Total Protein: 6.5 g/dL (ref 6.0–8.3)

## 2017-07-10 LAB — CBC
HCT: 24.4 % — ABNORMAL LOW (ref 39.0–52.0)
Hemoglobin: 8.1 g/dL — ABNORMAL LOW (ref 13.0–17.0)
MCHC: 33 g/dL (ref 30.0–36.0)
MCV: 102.6 fl — AB (ref 78.0–100.0)
Platelets: 186 10*3/uL (ref 150.0–400.0)
RBC: 2.38 Mil/uL — ABNORMAL LOW (ref 4.22–5.81)
RDW: 19.5 % — AB (ref 11.5–15.5)
WBC: 7.8 10*3/uL (ref 4.0–10.5)

## 2017-07-10 LAB — GAMMA GT: GGT: 36 U/L (ref 7–51)

## 2017-07-11 ENCOUNTER — Other Ambulatory Visit: Payer: Self-pay | Admitting: Family Medicine

## 2017-07-11 ENCOUNTER — Telehealth: Payer: Self-pay | Admitting: Hematology

## 2017-07-11 DIAGNOSIS — D539 Nutritional anemia, unspecified: Secondary | ICD-10-CM

## 2017-07-11 NOTE — Telephone Encounter (Signed)
Spoke with patient's daughter and scheduled a new heme appointment for 4/1 @ 1:00 pm.  They are aware to arrive 30 min early

## 2017-07-13 LAB — IRON,TIBC AND FERRITIN PANEL
%SAT: 84 % — AB (ref 15–60)
FERRITIN: 1564 ng/mL — AB (ref 20–380)
Iron: 170 ug/dL (ref 50–180)
TIBC: 203 ug/dL — AB (ref 250–425)

## 2017-07-13 LAB — METHYLMALONIC ACID, SERUM: METHYLMALONIC ACID, QUANT: 249 nmol/L (ref 87–318)

## 2017-07-20 ENCOUNTER — Ambulatory Visit (INDEPENDENT_AMBULATORY_CARE_PROVIDER_SITE_OTHER): Payer: Medicare Other | Admitting: Family Medicine

## 2017-07-20 ENCOUNTER — Encounter (HOSPITAL_COMMUNITY): Payer: Self-pay | Admitting: Emergency Medicine

## 2017-07-20 ENCOUNTER — Encounter: Payer: Self-pay | Admitting: Family Medicine

## 2017-07-20 ENCOUNTER — Inpatient Hospital Stay (HOSPITAL_COMMUNITY)
Admission: EM | Admit: 2017-07-20 | Discharge: 2017-07-22 | DRG: 812 | Disposition: A | Discharge status: Home / Self Care | Payer: Medicare Other | Attending: Internal Medicine | Admitting: Internal Medicine

## 2017-07-20 VITALS — BP 132/78 | HR 97 | Temp 97.5°F | Ht 67.0 in | Wt 194.0 lb

## 2017-07-20 DIAGNOSIS — R351 Nocturia: Secondary | ICD-10-CM | POA: Diagnosis present

## 2017-07-20 DIAGNOSIS — Z8673 Personal history of transient ischemic attack (TIA), and cerebral infarction without residual deficits: Secondary | ICD-10-CM | POA: Diagnosis not present

## 2017-07-20 DIAGNOSIS — D649 Anemia, unspecified: Secondary | ICD-10-CM | POA: Diagnosis not present

## 2017-07-20 DIAGNOSIS — R32 Unspecified urinary incontinence: Secondary | ICD-10-CM | POA: Diagnosis present

## 2017-07-20 DIAGNOSIS — Z87891 Personal history of nicotine dependence: Secondary | ICD-10-CM

## 2017-07-20 DIAGNOSIS — I739 Peripheral vascular disease, unspecified: Secondary | ICD-10-CM | POA: Diagnosis present

## 2017-07-20 DIAGNOSIS — N1832 Chronic kidney disease, stage 3b: Secondary | ICD-10-CM | POA: Diagnosis present

## 2017-07-20 DIAGNOSIS — Z8042 Family history of malignant neoplasm of prostate: Secondary | ICD-10-CM

## 2017-07-20 DIAGNOSIS — Z955 Presence of coronary angioplasty implant and graft: Secondary | ICD-10-CM

## 2017-07-20 DIAGNOSIS — E785 Hyperlipidemia, unspecified: Secondary | ICD-10-CM | POA: Diagnosis not present

## 2017-07-20 DIAGNOSIS — M79605 Pain in left leg: Secondary | ICD-10-CM

## 2017-07-20 DIAGNOSIS — D539 Nutritional anemia, unspecified: Secondary | ICD-10-CM

## 2017-07-20 DIAGNOSIS — Z7982 Long term (current) use of aspirin: Secondary | ICD-10-CM | POA: Diagnosis not present

## 2017-07-20 DIAGNOSIS — N401 Enlarged prostate with lower urinary tract symptoms: Secondary | ICD-10-CM | POA: Diagnosis not present

## 2017-07-20 DIAGNOSIS — Z8249 Family history of ischemic heart disease and other diseases of the circulatory system: Secondary | ICD-10-CM | POA: Diagnosis not present

## 2017-07-20 DIAGNOSIS — N183 Chronic kidney disease, stage 3 (moderate): Secondary | ICD-10-CM | POA: Diagnosis not present

## 2017-07-20 DIAGNOSIS — N179 Acute kidney failure, unspecified: Secondary | ICD-10-CM | POA: Diagnosis present

## 2017-07-20 DIAGNOSIS — Z803 Family history of malignant neoplasm of breast: Secondary | ICD-10-CM

## 2017-07-20 DIAGNOSIS — I10 Essential (primary) hypertension: Secondary | ICD-10-CM

## 2017-07-20 DIAGNOSIS — Z79899 Other long term (current) drug therapy: Secondary | ICD-10-CM

## 2017-07-20 DIAGNOSIS — R748 Abnormal levels of other serum enzymes: Secondary | ICD-10-CM | POA: Diagnosis present

## 2017-07-20 DIAGNOSIS — D696 Thrombocytopenia, unspecified: Secondary | ICD-10-CM

## 2017-07-20 DIAGNOSIS — I129 Hypertensive chronic kidney disease with stage 1 through stage 4 chronic kidney disease, or unspecified chronic kidney disease: Secondary | ICD-10-CM | POA: Diagnosis not present

## 2017-07-20 DIAGNOSIS — I252 Old myocardial infarction: Secondary | ICD-10-CM

## 2017-07-20 DIAGNOSIS — M79604 Pain in right leg: Secondary | ICD-10-CM | POA: Diagnosis present

## 2017-07-20 DIAGNOSIS — I2510 Atherosclerotic heart disease of native coronary artery without angina pectoris: Secondary | ICD-10-CM | POA: Diagnosis not present

## 2017-07-20 LAB — CBC WITH DIFFERENTIAL/PLATELET
BASOS ABS: 0 10*3/uL (ref 0.0–0.1)
Basophils Relative: 0 %
Eosinophils Absolute: 0 10*3/uL (ref 0.0–0.7)
Eosinophils Relative: 1 %
HEMATOCRIT: 23.5 % — AB (ref 39.0–52.0)
HEMOGLOBIN: 7.3 g/dL — AB (ref 13.0–17.0)
LYMPHS ABS: 1.9 10*3/uL (ref 0.7–4.0)
Lymphocytes Relative: 31 %
MCH: 31.7 pg (ref 26.0–34.0)
MCHC: 31.1 g/dL (ref 30.0–36.0)
MCV: 102.2 fL — AB (ref 78.0–100.0)
Monocytes Absolute: 0.6 10*3/uL (ref 0.1–1.0)
Monocytes Relative: 10 %
NEUTROS ABS: 3.7 10*3/uL (ref 1.7–7.7)
NEUTROS PCT: 58 %
PLATELETS: 148 10*3/uL — AB (ref 150–400)
RBC: 2.3 MIL/uL — AB (ref 4.22–5.81)
RDW: 18.3 % — ABNORMAL HIGH (ref 11.5–15.5)
WBC: 6.3 10*3/uL (ref 4.0–10.5)

## 2017-07-20 LAB — COMPREHENSIVE METABOLIC PANEL
ALK PHOS: 488 U/L — AB (ref 38–126)
ALT: 20 U/L (ref 17–63)
AST: 26 U/L (ref 15–41)
Albumin: 3.3 g/dL — ABNORMAL LOW (ref 3.5–5.0)
Anion gap: 9 (ref 5–15)
BILIRUBIN TOTAL: 1.2 mg/dL (ref 0.3–1.2)
BUN: 17 mg/dL (ref 6–20)
CALCIUM: 8.5 mg/dL — AB (ref 8.9–10.3)
CHLORIDE: 103 mmol/L (ref 101–111)
CO2: 24 mmol/L (ref 22–32)
CREATININE: 2.13 mg/dL — AB (ref 0.61–1.24)
GFR calc non Af Amer: 27 mL/min — ABNORMAL LOW (ref >60)
GFR, EST AFRICAN AMERICAN: 31 mL/min — AB (ref >60)
Glucose, Bld: 130 mg/dL — ABNORMAL HIGH (ref 65–99)
Potassium: 4.9 mmol/L (ref 3.5–5.1)
Sodium: 136 mmol/L (ref 135–145)
Total Protein: 6.6 g/dL (ref 6.5–8.1)

## 2017-07-20 LAB — RETICULOCYTES
RBC.: 2.15 MIL/uL — ABNORMAL LOW (ref 4.22–5.81)
Retic Count, Absolute: 55.9 K/uL (ref 19.0–186.0)
Retic Ct Pct: 2.6 % (ref 0.4–3.1)

## 2017-07-20 LAB — PROTIME-INR
INR: 1.16
PROTHROMBIN TIME: 14.7 s (ref 11.4–15.2)

## 2017-07-20 LAB — CBC
HCT: 22.1 % — ABNORMAL LOW (ref 39.0–52.0)
Hemoglobin: 7 g/dL — ABNORMAL LOW (ref 13.0–17.0)
MCH: 32.6 pg (ref 26.0–34.0)
MCHC: 31.7 g/dL (ref 30.0–36.0)
MCV: 102.8 fL — ABNORMAL HIGH (ref 78.0–100.0)
Platelets: 141 K/uL — ABNORMAL LOW (ref 150–400)
RBC: 2.15 MIL/uL — ABNORMAL LOW (ref 4.22–5.81)
RDW: 18.6 % — ABNORMAL HIGH (ref 11.5–15.5)
WBC: 5.7 K/uL (ref 4.0–10.5)

## 2017-07-20 LAB — LACTATE DEHYDROGENASE: LDH: 256 U/L — ABNORMAL HIGH (ref 98–192)

## 2017-07-20 LAB — SAVE SMEAR

## 2017-07-20 LAB — ABO/RH: ABO/RH(D): O POS

## 2017-07-20 LAB — CREATININE, SERUM
Creatinine, Ser: 2.02 mg/dL — ABNORMAL HIGH (ref 0.61–1.24)
GFR calc Af Amer: 33 mL/min — ABNORMAL LOW
GFR calc non Af Amer: 28 mL/min — ABNORMAL LOW

## 2017-07-20 LAB — HEMOGLOBIN A1C
Hgb A1c MFr Bld: 4.5 % — ABNORMAL LOW (ref 4.8–5.6)
Mean Plasma Glucose: 82.45 mg/dL

## 2017-07-20 LAB — PREPARE RBC (CROSSMATCH)

## 2017-07-20 LAB — CK: Total CK: 35 U/L — ABNORMAL LOW (ref 49–397)

## 2017-07-20 LAB — POC OCCULT BLOOD, ED: Fecal Occult Bld: NEGATIVE

## 2017-07-20 MED ORDER — METOPROLOL SUCCINATE ER 25 MG PO TB24
25.0000 mg | ORAL_TABLET | Freq: Every day | ORAL | Status: DC
  Administered 2017-07-20 – 2017-07-21 (×2): 25 mg via ORAL
  Filled 2017-07-20 (×3): qty 1

## 2017-07-20 MED ORDER — ENOXAPARIN SODIUM 30 MG/0.3ML ~~LOC~~ SOLN
30.0000 mg | SUBCUTANEOUS | Status: DC
  Administered 2017-07-20 – 2017-07-21 (×2): 30 mg via SUBCUTANEOUS
  Filled 2017-07-20 (×2): qty 0.3

## 2017-07-20 MED ORDER — SODIUM CHLORIDE 0.9 % IV SOLN
10.0000 mL/h | Freq: Once | INTRAVENOUS | Status: AC
  Administered 2017-07-20: 10 mL/h via INTRAVENOUS

## 2017-07-20 MED ORDER — TAMSULOSIN HCL 0.4 MG PO CAPS
0.4000 mg | ORAL_CAPSULE | Freq: Every day | ORAL | Status: DC
  Administered 2017-07-20 – 2017-07-21 (×2): 0.4 mg via ORAL
  Filled 2017-07-20 (×3): qty 1

## 2017-07-20 MED ORDER — TAMSULOSIN HCL 0.4 MG PO CAPS: 0.4000 mg | ORAL_CAPSULE | Freq: Every day | ORAL | Status: DC

## 2017-07-20 NOTE — ED Provider Notes (Signed)
Patient placed in Quick Look pathway, seen and evaluated   Chief Complaint: Low hemoglobin and bilateral leg pain.  HPI:   Patient here for complaint of bilateral leg pain times 1 week.  He is being seen twice by his PCP for low hemoglobin which is new since 6 months ago.  He denies melena or hematochezia and workup of his anemia has revealed no issues with his iron or B12 levels.  He is not on any blood thinners.  This week he gets cramping pain in his thighs when he is trying to ambulate which is giving him significant difficulty.  Before this week he was able to walk easily and had no issues.  He is using a cane which is new this week.  He has a history of previous MI but denies a known history of peripheral vascular disease.  He denies back pain.  Review of EMR also shows that his PCP has concern for potential myelodysplastic disorder and he has worsening renal function.  ROS: Leg pain (one)  Physical Exam:   Gen: No distress  Neuro: Awake and Alert  Skin: Warm    Focused Exam: 1+ pedal pulse of the right DP and PT.  1+ left PT pulse.  I was able to Doppler the left DP pulse.  Patient moves legs easily.  He is seated in a wheelchair.   Initiation of care has begun. The patient has been counseled on the process, plan, and necessity for staying for the completion/evaluation, and the remainder of the medical screening examination    Ned Grace 07/20/17 1605    Lacretia Leigh, MD 07/21/17 2213

## 2017-07-20 NOTE — ED Triage Notes (Signed)
Patient to ED from home c/o worsening fatigue and generalized weakness. Patient also endorses bilateral thigh pain (cramping) only with ambulating. Patient denies SOB, but family at bedside states he appears short of breath with some exertion. Hgb 8.1 on 07/10/17 - was told to go to ED for worsening symptoms. Patient denies black or bloody stools. Resp e/u, skin warm/dry. Pedal pulses auscultated with doppler, cap refill <3 all extremities.

## 2017-07-20 NOTE — Progress Notes (Signed)
    Subjective:  Tony [name] is a 84 y.o. male who presents today with a chief complaint of anemia.   HPI:  Anemia, new problem Recently found on blood work from 2 weeks ago.  Was previously told that he had iron deficiency anemia and has been on iron in the past.  No hematochezia or melena.  No other obvious signs of bleeding.  Leg pain, established problem, stable This was the reason for patient's initial visit on 07/06/2017.  Symptoms are little bit better since his last visit.  Still has significant pain with walking.  No pain at rest.  ROS: Per HPI  Objective:  Physical Exam: BP 132/78 (BP Location: Right Arm)   Pulse 97   Temp (!) 97.5 F (36.4 C)   Ht _0  (1.702 m)   Wt 194 lb (88 kg)   SpO2 96%   BMI 30.38 kg/m   Gen: NAD, resting comfortably Skin: Warm, dry Neuro: Grossly normal, moves all extremities Psych: Normal affect and thought content   Assessment/Plan:  Macrocytic anemia Patient's anemia workup thus far has been negative including elevated iron levels, normal B12, and normal methylmalonic acid.  Lab work also concerning for worsening renal function and significantly elevated alk phos.  Concern for myelodysplastic disorder or other form of under production.  He has hematology appointment scheduled on April 1.  Discussed reasons to return to care or seek emergent care sooner.  Leg pain As noted above workup really only significant for macrocytic anemia with elevated alkaline phosphatase.  It is possible that his leg pain is related to his anemia has healing seems to have symptoms on exertion.  We will await further workup for his anemia.  If pain persists despite treatment for his anemia, may consider vascular studies and/or referral to orthopedics.  Time Spent: I spent 20 minutes face-to-face with the patient, with more than half spent on counseling for possible etiology for his anemia and leg pain, as well as treatment plan.   [name]. [name],  MD 07/20/2017 1:18 PM

## 2017-07-20 NOTE — H&P (Addendum)
History and Physical    Tony [name]:[id number] DOB: 06-12-1930 DOA: 07/20/2017  PCP: Marin Olp, MD  Patient coming from: home   Chief Complaint: fatigue  HPI: Tony [name] is a 84 y.o. male with medical history significant for cad/cva (in 2012, stent in LAD), HTN, ckd3, bph, who presents with above cc.  For about a month has experienced worsening fatigue and dyspnea with exertion. Also has developed anterior and posterior thigh pain, an aching pain, with ambulation, that resolves with rest. No chest pain or palpitations. No new medications including OTCs. No hemoptysis or hematemesis. No hematochezia, melena, or maroon-colored stools. No hematuria. No abd pain or distention. No back pain. Drank heavily for a few years as a young man but otherwise no sig drinking history. No hx liver disease or thyroid disease. Tries to limit oral intake 2/2 incontinence. But otherwise no sig recent limits on intake, no vomiting or diarrhea.  Presented to pcp several weeks ago. W/u there has revealed macrocytic anemia, further labs have not been revealing. Has been referred to hematology, has visit there scheduled 4/1.  ED Course: labs  Review of Systems: As per HPI otherwise 10 point review of systems negative.    Past Medical History:  Diagnosis Date  . BPH associated with nocturia    flomax 0.4--> 0.8 mg trial. nocturia if has coffee. some incontinence  . CAD (coronary artery disease)    LAD Stent 2012. 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   . Gout    no rx. apparently 1x in past  . History of stroke    no aspirin before stroke. no deficits. slurred words at time of stroke  . Hyperlipidemia   . Hypertension     Past Surgical History:  Procedure Laterality Date  . CARDIAC SURGERY     Stint  . CORONARY STENT PLACEMENT    . left arm fracture s/p surgery    . right leg fracture s.p surgery- screws like arm       reports that he quit smoking about 65  years ago. His smoking use included cigarettes. He has a 0.50 pack-year smoking history. He has never used smokeless tobacco. He reports that he does not drink alcohol or use drugs.  No Known Allergies  Family History  Problem Relation Age of Onset  . Breast cancer Mother   . Heart disease Father   . Heart disease Brother        x2  . Prostate cancer Brother     Prior to Admission medications   Medication Sig Start Date End Date Taking? Authorizing Provider  amLODipine (NORVASC) 5 MG tablet Take 1 tablet (5 mg total) by mouth daily. 01/23/17   Marin Olp, MD  aspirin EC 81 MG tablet Take 1 tablet (81 mg total) by mouth daily. 01/29/17   [name], MD  atorvastatin (LIPITOR) 80 MG tablet Take 1 tablet (80 mg total) by mouth daily. Patient not taking: Reported on 07/20/2017 11/17/16   Marin Olp, MD  isosorbide-hydrALAZINE (BIDIL) 20-37.5 MG tablet Take 1 tablet by mouth 3 (three) times daily. 11/07/16   Marin Olp, MD  metoprolol succinate (TOPROL-XL) 25 MG 24 hr tablet Take 1 tablet (25 mg total) by mouth daily. 01/23/17   Marin Olp, MD  nitroGLYCERIN (NITROSTAT) 0.4 MG SL tablet Place 1 tablet (0.4 mg total) under the tongue every 5 (five) minutes as needed for chest pain (3 maximum before  seeking care). 11/17/16   Marin Olp, MD  tamsulosin (FLOMAX) 0.4 MG CAPS capsule TAKE 1 CAPSULE(0.4 MG) BY MOUTH DAILY 06/08/17   Marin Olp, MD    Physical Exam: Vitals:   07/20/17 1540 07/20/17 1745  BP: 127/61 131/66  Pulse: 97 81  Resp: 16   Temp: 97.9 F (36.6 C)   TempSrc: Oral   SpO2: 99% 97%    Constitutional: No acute distress Head: Atraumatic Eyes: Conjunctiva clear ENM: Moist mucous membranes. Normal dentition.  Neck: Supple Respiratory: Clear to auscultation bilaterally, no wheezing/rales/rhonchi. Normal respiratory effort. No accessory muscle use. . Cardiovascular: Regular rate and rhythm. Moderate systolic murmur Abdomen:  Non-tender, mildly distended. No masses. No rebound or guarding. Positive bowel sounds. Musculoskeletal: No joint deformity upper and lower extremities. Normal ROM, no contractures. Normal muscle tone.  Skin: No rashes, lesions, or ulcers.  Extremities: moderate symmetric pitting LE edema. warm. Neurologic: Alert, moving all 4 extremities. Psychiatric: Normal insight and judgement.   Labs on Admission: I have personally reviewed following labs and imaging studies  CBC: Recent Labs  Lab 07/20/17 1600  WBC 6.3  NEUTROABS 3.7  HGB 7.3*  HCT 23.5*  MCV 102.2*  PLT 585*   Basic Metabolic Panel: Recent Labs  Lab 07/20/17 1600  NA 136  K 4.9  CL 103  CO2 24  GLUCOSE 130*  BUN 17  CREATININE 2.13*  CALCIUM 8.5*   GFR: Estimated Creatinine Clearance: 26.9 mL/min (A) (by C-G formula based on SCr of 2.13 mg/dL (H)). Liver Function Tests: Recent Labs  Lab 07/20/17 1600  AST 26  ALT 20  ALKPHOS 488*  BILITOT 1.2  PROT 6.6  ALBUMIN 3.3*   No results for input(s): LIPASE, AMYLASE in the last 168 hours. No results for input(s): AMMONIA in the last 168 hours. Coagulation Profile: Recent Labs  Lab 07/20/17 1600  INR 1.16   Cardiac Enzymes: No results for input(s): CKTOTAL, CKMB, CKMBINDEX, TROPONINI in the last 168 hours. BNP (last 3 results) No results for input(s): PROBNP in the last 8760 hours. HbA1C: No results for input(s): HGBA1C in the last 72 hours. CBG: No results for input(s): GLUCAP in the last 168 hours. Lipid Profile: No results for input(s): CHOL, HDL, LDLCALC, TRIG, CHOLHDL, LDLDIRECT in the last 72 hours. Thyroid Function Tests: No results for input(s): TSH, T4TOTAL, FREET4, T3FREE, THYROIDAB in the last 72 hours. Anemia Panel: No results for input(s): VITAMINB12, FOLATE, FERRITIN, TIBC, IRON, RETICCTPCT in the last 72 hours. Urine analysis:    Component Value Date/Time   COLORURINE AMBER (A) 05/06/2012 2022   APPEARANCEUR HAZY (A) 05/06/2012  2022   LABSPEC 1.028 05/06/2012 2022   PHURINE 5.0 05/06/2012 2022   GLUCOSEU NEGATIVE 05/06/2012 2022   HGBUR NEGATIVE 05/06/2012 2022   BILIRUBINUR SMALL (A) 05/06/2012 2022   KETONESUR NEGATIVE 05/06/2012 2022   PROTEINUR NEGATIVE 05/06/2012 2022   UROBILINOGEN 1.0 05/06/2012 2022   NITRITE NEGATIVE 05/06/2012 2022   LEUKOCYTESUR MODERATE (A) 05/06/2012 2022    Radiological Exams on Admission: No results found.   Assessment/Plan Active Problems:   Hypertension   BPH associated with nocturia   CAD (coronary artery disease)   History of stroke   CKD (chronic kidney disease), stage III ([facility])   Macrocytic anemia   AKI (acute kidney injury) ([place])   Thrombocytopenia ([facility])   Claudication of both lower extremities ([facility])   # Symptomatic macrocytic anemia: etiology unclear. Hemodynamically stable. W/u thus far unrevealing, though thrombocytopenia does raise suspicion for myelodysplastic  process. Not clear to me association w/ claudication symptoms, other that significant anemia likely worsens claudication symptoms. Elevated alk phos with normal ggt is, however, suggestive of bone disorder. No sig alcohol history. b12 normal. No signs of blood loss. tsh wnl. No signs of hemolysis on labs drawn thus far. Not taking meds asociated w/ macrocytic anemia. Has hematology appt 4/1. - admit - tele - 2 units ordered, repeat cbc in am - f/u stool occult blood - peripheral smear ordered to be held - reticulocyte count - folate level - homocysteine - LDH - EPO - HIV  # elevated akk phos- see above. ggt normal, LFTs normal, suggestive of bone origin. Calcium low normal. - pth, vit d  # Acute kidney injury - cr 2.12 from baseline 1.8. Etiology unclear, most likely prerenal, pt does endorse significantly limiting his liquid intake to avoid incontinent episodes. - receiving 2 units PRBCs, will hold on fluids overnight given CAD - repeat cr in AM, fluids if remains elevated - liberal PO  #  Thrombocytopenia - mild, see myelodysplastic discussion above. No signs acute bleeding that would suggest a consumptive process. - f/u hiv, hcv, peripheral smear  # Claudication - hx microvascular disease (CAD, CVA), symptoms appear typical. - f/u CK - LE arterial dopplers ordered, could also obtain as outpt  # HTN - here bp wnl # CAD - no chest pain - hold amlod and bydil overnight given symptomatic anemia without a clear source, would resume if remains stable - continue metoprolol - hold aspirin and statin  # BPH - continue flomax  DVT prophylaxis: lovenox, scds Code Status: full Family Communication: daughter [name] [phone] Disposition Plan: tbd  Consults called: none  Admission status: tele inpt    Desma Maxim MD [facility] Pager [phone]  If 7PM-7AM, please contact night-coverage [email or web address] Password [facility]  07/20/2017, 6:55 PM

## 2017-07-20 NOTE — ED Notes (Signed)
2 units blood ready.

## 2017-07-20 NOTE — ED Provider Notes (Signed)
[facility]GENC[facility] DEPARTMENT Provider Note   CSN: [id number] Arrival date & time: 07/20/17  1530     History   Chief Complaint Chief Complaint  Patient presents with  . Fatigue  . Claudication    HPI Tony [name] is a 84 y.o. male.  84 year old male presents with one-week history of bilateral leg pain with associated dyspnea on exertion.  Is currently being worked up for anemia by his primary care doctor who he did see today.  Has not had any black or bloody stools.  No history of hematemesis.  Does not take blood thinners.  No cough or congestion or chest pain.  No back pain.  No bowel or bladder dysfunction.  Has had dull pain when he tries to walk for several feet but gets better with rest.  His primary care doctor is concerned about myelodysplastic disease and has referred him to hematology and a week and a half.  Center due to worsening symptoms.     Past Medical History:  Diagnosis Date  . BPH associated with nocturia    flomax 0.4--> 0.8 mg trial. nocturia if has coffee. some incontinence  . CAD (coronary artery disease)    LAD Stent 2012. 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   . Gout    no rx. apparently 1x in past  . History of stroke    no aspirin before stroke. no deficits. slurred words at time of stroke  . Hyperlipidemia   . Hypertension     Patient Active Problem List   Diagnosis Date Noted  . Macrocytic anemia 07/20/2017  . Iron deficiency anemia 10/30/2016  . History of complete AV block 10/30/2016  . Venous insufficiency 10/30/2016  . CKD (chronic kidney disease), stage III ([place]) 10/30/2016  . Hypertension   . Hyperlipidemia   . BPH associated with nocturia   . CAD (coronary artery disease)   . History of stroke   . Gout     Past Surgical History:  Procedure Laterality Date  . CARDIAC SURGERY     Stint  . CORONARY STENT PLACEMENT    . left arm fracture s/p surgery    . right leg fracture s.p  surgery- screws like arm          Home Medications    Prior to Admission medications   Medication Sig Start Date End Date Taking? Authorizing Provider  amLODipine (NORVASC) 5 MG tablet Take 1 tablet (5 mg total) by mouth daily. 01/23/17   Marin Olp, MD  aspirin EC 81 MG tablet Take 1 tablet (81 mg total) by mouth daily. 01/29/17   [name], MD  atorvastatin (LIPITOR) 80 MG tablet Take 1 tablet (80 mg total) by mouth daily. Patient not taking: Reported on 07/20/2017 11/17/16   Marin Olp, MD  isosorbide-hydrALAZINE (BIDIL) 20-37.5 MG tablet Take 1 tablet by mouth 3 (three) times daily. 11/07/16   Marin Olp, MD  metoprolol succinate (TOPROL-XL) 25 MG 24 hr tablet Take 1 tablet (25 mg total) by mouth daily. 01/23/17   Marin Olp, MD  nitroGLYCERIN (NITROSTAT) 0.4 MG SL tablet Place 1 tablet (0.4 mg total) under the tongue every 5 (five) minutes as needed for chest pain (3 maximum before seeking care). 11/17/16   Marin Olp, MD  predniSONE (DELTASONE) 20 MG tablet Take 1 tablet (20 mg total) by mouth daily with breakfast. Patient not taking: Reported on 07/20/2017 07/06/17   [name],  [name], MD  tamsulosin (FLOMAX) 0.4 MG CAPS capsule TAKE 1 CAPSULE(0.4 MG) BY MOUTH DAILY 06/08/17   Marin Olp, MD    Family History Family History  Problem Relation Age of Onset  . Breast cancer Mother   . Heart disease Father   . Heart disease Brother        x2  . Prostate cancer Brother     Social History Social History   Tobacco Use  . Smoking status: Former Smoker    Packs/day: 0.50    Years: 1.00    Pack years: 0.50    Types: Cigarettes    Last attempt to quit: 05/01/1952    Years since quitting: 65.2  . Smokeless tobacco: Never Used  Substance Use Topics  . Alcohol use: No  . Drug use: No     Allergies   Patient has no known allergies.   Review of Systems Review of Systems  All other systems reviewed and are negative.    Physical  Exam Updated Vital Signs BP 131/66   Pulse 81   Temp 97.9 F (36.6 C) (Oral)   Resp 16   SpO2 97%   Physical Exam  Constitutional: He is oriented to person, place, and time. He appears well-developed and well-nourished.  Non-toxic appearance. No distress.  HENT:  Head: Normocephalic and atraumatic.  Eyes: Pupils are equal, round, and reactive to light. Conjunctivae, EOM and lids are normal.  Pale conjunctive a bilaterally  Neck: Normal range of motion. Neck supple. No tracheal deviation present. No thyroid mass present.  Cardiovascular: Normal rate, regular rhythm and normal heart sounds. Exam reveals no gallop.  No murmur heard. Pulmonary/Chest: Effort normal and breath sounds normal. No stridor. No respiratory distress. He has no decreased breath sounds. He has no wheezes. He has no rhonchi. He has no rales.  Abdominal: Soft. Normal appearance and bowel sounds are normal. He exhibits no distension. There is no tenderness. There is no rebound and no CVA tenderness.  Musculoskeletal: Normal range of motion. He exhibits no edema or tenderness.  Palpable dorsalis pedis pulses bilaterally  Lymphadenopathy:  3+ bilateral lower extremity pitting edema.  Neurological: He is alert and oriented to person, place, and time. He has normal strength. No cranial nerve deficit or sensory deficit. GCS eye subscore is 4. GCS verbal subscore is 5. GCS motor subscore is 6.  Skin: Skin is warm and dry. No abrasion and no rash noted.  Psychiatric: He has a normal mood and affect. His speech is normal and behavior is normal.  Nursing note and vitals reviewed.    ED Treatments / Results  Labs (all labs ordered are listed, but only abnormal results are displayed) Labs Reviewed  CBC WITH DIFFERENTIAL/PLATELET - Abnormal; Notable for the following components:      Result Value   RBC 2.30 (*)    Hemoglobin 7.3 (*)    HCT 23.5 (*)    MCV 102.2 (*)    RDW 18.3 (*)    Platelets 148 (*)    All other  components within normal limits  COMPREHENSIVE METABOLIC PANEL - Abnormal; Notable for the following components:   Glucose, Bld 130 (*)    Creatinine, Ser 2.13 (*)    Calcium 8.5 (*)    Albumin 3.3 (*)    Alkaline Phosphatase 488 (*)    GFR calc non Af Amer 27 (*)    GFR calc Af Amer 31 (*)    All other components within normal limits  PROTIME-INR  POC OCCULT BLOOD, ED  TYPE AND SCREEN  ABO/RH    EKG None  Radiology No results found.  Procedures Procedures (including critical care time)  Medications Ordered in ED Medications - No data to display   Initial Impression / Assessment and Plan / ED Course  I have reviewed the triage vital signs and the nursing notes.  Pertinent labs & imaging results that were available during my care of the patient were reviewed by me and considered in my medical decision making (see chart for details).    Patient worsening renal function here as well as decreasing hemoglobin.  Has no history of gastrointestinal bleeding.  Does not appear to have any acute vascular emergency at this time.  No signs of ischemic limbs.  Due to his worsening renal function as well as worsening anemia.  Will consult hospitalist for admission   Final Clinical Impressions(s) / ED Diagnoses   Final diagnoses:  None    ED Discharge Orders    None       Lacretia Leigh, MD 07/20/17 1755

## 2017-07-20 NOTE — ED Notes (Signed)
Pt/Cg states that he was sent to ER from MD office after c/o of low blood levels and increase pain upon ambulation in bilateral lower extremities.

## 2017-07-20 NOTE — Assessment & Plan Note (Signed)
Patient's anemia workup thus far has been negative including elevated iron levels, normal B12, and normal methylmalonic acid.  Lab work also concerning for worsening renal function and significantly elevated alk phos.  Concern for myelodysplastic disorder or other form of under production.  He has hematology appointment scheduled on April 1.  Discussed reasons to return to care or seek emergent care sooner.

## 2017-07-20 NOTE — Patient Instructions (Signed)
Please keep your follow up appointment with the blood specialist. We need to do more testing to find out why your blood counts are low.  If your pain worsens or if you have any chest pain, or shortness of breath, please go to the ED.  Take care, Dr [name]

## 2017-07-21 ENCOUNTER — Other Ambulatory Visit: Payer: Self-pay

## 2017-07-21 ENCOUNTER — Inpatient Hospital Stay (HOSPITAL_COMMUNITY): Payer: Medicare Other

## 2017-07-21 DIAGNOSIS — D539 Nutritional anemia, unspecified: Principal | ICD-10-CM

## 2017-07-21 DIAGNOSIS — I739 Peripheral vascular disease, unspecified: Secondary | ICD-10-CM

## 2017-07-21 LAB — COMPREHENSIVE METABOLIC PANEL
ALK PHOS: 433 U/L — AB (ref 38–126)
ALT: 17 U/L (ref 17–63)
AST: 23 U/L (ref 15–41)
Albumin: 2.7 g/dL — ABNORMAL LOW (ref 3.5–5.0)
Anion gap: 9 (ref 5–15)
BILIRUBIN TOTAL: 1.1 mg/dL (ref 0.3–1.2)
BUN: 16 mg/dL (ref 6–20)
CALCIUM: 7.9 mg/dL — AB (ref 8.9–10.3)
CO2: 22 mmol/L (ref 22–32)
Chloride: 103 mmol/L (ref 101–111)
Creatinine, Ser: 1.85 mg/dL — ABNORMAL HIGH (ref 0.61–1.24)
GFR calc Af Amer: 37 mL/min — ABNORMAL LOW (ref >60)
GFR calc non Af Amer: 32 mL/min — ABNORMAL LOW (ref >60)
GLUCOSE: 101 mg/dL — AB (ref 65–99)
Potassium: 4.2 mmol/L (ref 3.5–5.1)
Sodium: 134 mmol/L — ABNORMAL LOW (ref 135–145)
Total Protein: 5.7 g/dL — ABNORMAL LOW (ref 6.5–8.1)

## 2017-07-21 LAB — CBC
HCT: 24 % — ABNORMAL LOW (ref 39.0–52.0)
Hemoglobin: 7.7 g/dL — ABNORMAL LOW (ref 13.0–17.0)
MCH: 31.4 pg (ref 26.0–34.0)
MCHC: 32.1 g/dL (ref 30.0–36.0)
MCV: 98 fL (ref 78.0–100.0)
PLATELETS: 133 10*3/uL — AB (ref 150–400)
RBC: 2.45 MIL/uL — ABNORMAL LOW (ref 4.22–5.81)
RDW: 20 % — AB (ref 11.5–15.5)
WBC: 5.4 10*3/uL (ref 4.0–10.5)

## 2017-07-21 LAB — HIV ANTIBODY (ROUTINE TESTING W REFLEX): HIV Screen 4th Generation wRfx: NONREACTIVE

## 2017-07-21 LAB — PREPARE RBC (CROSSMATCH)

## 2017-07-21 MED ORDER — SODIUM CHLORIDE 0.9 % IV SOLN
Freq: Once | INTRAVENOUS | Status: AC
  Administered 2017-07-21: 16:00:00 via INTRAVENOUS

## 2017-07-21 NOTE — Progress Notes (Signed)
PROGRESS NOTE    Tony [name]  KGY:[id number] DOB: 1931-01-13 DOA: 07/20/2017 PCP: [name], MD   Brief Narrative:  HPI on 07/20/2017 by Dr. Laurey Arrow RADLEY [name] is a 84 y.o. male with medical history significant for cad/cva (in 2012, stent in LAD), HTN, ckd3, bph, who presents with above cc.  For about a month has experienced worsening fatigue and dyspnea with exertion. Also has developed anterior and posterior thigh pain, an aching pain, with ambulation, that resolves with rest. No chest pain or palpitations. No new medications including OTCs. No hemoptysis or hematemesis. No hematochezia, melena, or maroon-colored stools. No hematuria. No abd pain or distention. No back pain. Drank heavily for a few years as a young man but otherwise no sig drinking history. No hx liver disease or thyroid disease. Tries to limit oral intake 2/2 incontinence. But otherwise no sig recent limits on intake, no vomiting or diarrhea.  Presented to pcp several weeks ago. W/u there has revealed macrocytic anemia, further labs have not been revealing. Has been referred to hematology, has visit there scheduled 4/1.  Assessment & Plan   Macrocytic Anemia/Symptomatic anemia -currently hemodynamically stable -presented with hemoglobin 7 (hemoglobin in July 2018 14.3) -given 2u PRBC, hemoglobin currently 7.7 -Patient was seeing his primary care physician for this and was referred to hematology as an outpatient, upcoming appointment on 07/30/2017 -Concern for myelodysplastic process given anemia and thrombocytopenia -FOBT negative -B12 normal, TSH within normal limits -Discussed with hematology, Dr. Alen Blew, who recommended giving 1 more unit PRBC and having patient follow-up as an outpatient.  Patient may need a bone marrow biopsy which can be done as an outpatient. -Pending EPO, homocystine, folate, and reticulocyte count  Acute kidney injury on chronic kidney disease, stage III -baseline creatinine  1.4-1.8, creatinine upon admission 2.13 -Creatinine improving, currently 1.85 -Continue to monitor BMP  Thrombocytopenia -See discussion above -Pending peripheral smear  Elevated alk phos -Possibly related to the above -LFTs within normal limits -Calcium low normal -PTH and vitamin D levels pending  CAD -Currently no chest pain -continue metoprolol -aspirin held due to anemia -statin held due to lower ext pain  Essential hypertension -Amlodipine and BiDil held due to symptomatic anemia -Continue metoprolol  Claudication/lower extremity pain -Patient has been following with his PCP for this -Statin held -LE dopplers ordered -CK 35  History of CVA -Aspirin held due to anemia  BPH -Continue Flomax  DVT Prophylaxis  SCDs  Code Status: Full  Family Communication: None at bedside  Disposition Plan: Admitted, pending 1uPRBC transfusion, improvement in symptoms. Suspect discharge to home in 24-48 hours.  Consultants Hematology, Dr. Alen Blew, via phone  Procedures  None  Antibiotics   Anti-infectives (From admission, onward)   None      Subjective:   Kameran Mcneese seen and examined today.  Patient states that he is feeling mildly better.  Has not been out of bed, does not know if he will be short of breath.  Denies current chest pain, abdominal pain, nausea or vomiting, diarrhea or constipation.  Objective:   Vitals:   07/20/17 2240 07/21/17 0133 07/21/17 0510 07/21/17 1351  BP: 130/62 132/68 (!) 111/52 (!) 118/58  Pulse: (!) 102 80 81 73  Resp: '16 17 18 16  '$ Temp: 98.2 F (36.8 C) 98.6 F (37 C) 98.8 F (37.1 C) 98.5 F (36.9 C)  TempSrc: Oral Oral Oral Oral  SpO2: 100% 96% 96% 99%  Weight:  87.9 kg (193 lb 12.5 oz)  Height:  '5\' 7"'$  (1.702 m)      Intake/Output Summary (Last 24 hours) at 07/21/2017 1401 Last data filed at 07/21/2017 1010 Gross per 24 hour  Intake 675 ml  Output 1250 ml  Net -575 ml   Filed Weights   07/21/17 0133  Weight: 87.9  kg (193 lb 12.5 oz)    Exam  General: Well developed, well nourished, NAD, appears stated age  HEENT: NCAT, mucous membranes moist.   Neck: Supple  Cardiovascular: S1 S2 auscultated, RRR, SEM  Respiratory: Clear to auscultation bilaterally with equal chest rise  Abdomen: Soft, nontender, nondistended, + bowel sounds  Extremities: warm dry without cyanosis clubbing. LE edema  Neuro: AAOx3, nonfocal  Psych: Normal affect and demeanor with intact judgement and insight   Data Reviewed: I have personally reviewed following labs and imaging studies  CBC: Recent Labs  Lab 07/20/17 1600 07/20/17 2103 07/21/17 0536  WBC 6.3 5.7 5.4  NEUTROABS 3.7  --   --   HGB 7.3* 7.0* 7.7*  HCT 23.5* 22.1* 24.0*  MCV 102.2* 102.8* 98.0  PLT 148* 141* 818*   Basic Metabolic Panel: Recent Labs  Lab 07/20/17 1600 07/20/17 2103 07/21/17 0536  NA 136  --  134*  K 4.9  --  4.2  CL 103  --  103  CO2 24  --  22  GLUCOSE 130*  --  101*  BUN 17  --  16  CREATININE 2.13* 2.02* 1.85*  CALCIUM 8.5*  --  7.9*   GFR: Estimated Creatinine Clearance: 30.9 mL/min (A) (by C-G formula based on SCr of 1.85 mg/dL (H)). Liver Function Tests: Recent Labs  Lab 07/20/17 1600 07/21/17 0536  AST 26 23  ALT 20 17  ALKPHOS 488* 433*  BILITOT 1.2 1.1  PROT 6.6 5.7*  ALBUMIN 3.3* 2.7*   No results for input(s): LIPASE, AMYLASE in the last 168 hours. No results for input(s): AMMONIA in the last 168 hours. Coagulation Profile: Recent Labs  Lab 07/20/17 1600  INR 1.16   Cardiac Enzymes: Recent Labs  Lab 07/20/17 2103  CKTOTAL 35*   BNP (last 3 results) No results for input(s): PROBNP in the last 8760 hours. HbA1C: Recent Labs    07/20/17 2103  HGBA1C 4.5*   CBG: No results for input(s): GLUCAP in the last 168 hours. Lipid Profile: No results for input(s): CHOL, HDL, LDLCALC, TRIG, CHOLHDL, LDLDIRECT in the last 72 hours. Thyroid Function Tests: No results for input(s): TSH,  T4TOTAL, FREET4, T3FREE, THYROIDAB in the last 72 hours. Anemia Panel: Recent Labs    07/20/17 2103  RETICCTPCT 2.6   Urine analysis:    Component Value Date/Time   COLORURINE AMBER (A) 05/06/2012 2022   APPEARANCEUR HAZY (A) 05/06/2012 2022   LABSPEC 1.028 05/06/2012 2022   PHURINE 5.0 05/06/2012 2022   GLUCOSEU NEGATIVE 05/06/2012 2022   HGBUR NEGATIVE 05/06/2012 2022   BILIRUBINUR SMALL (A) 05/06/2012 2022   KETONESUR NEGATIVE 05/06/2012 2022   PROTEINUR NEGATIVE 05/06/2012 2022   UROBILINOGEN 1.0 05/06/2012 2022   NITRITE NEGATIVE 05/06/2012 2022   LEUKOCYTESUR MODERATE (A) 05/06/2012 2022   Sepsis Labs: '@LABRCNTIP'$ (procalcitonin:4,lacticidven:4)  )No results found for this or any previous visit (from the past 240 hour(s)).    Radiology Studies: No results found.   Scheduled Meds: . enoxaparin (LOVENOX) injection  30 mg Subcutaneous Q24H  . metoprolol succinate  25 mg Oral QAC supper  . tamsulosin  0.4 mg Oral QPC supper   Continuous Infusions: . sodium chloride  LOS: 1 day   Time Spent in minutes   30 minutes  Bexley Laubach D.O. on 07/21/2017 at 2:01 PM  Between 7am to 7pm - Pager - [phone]  After 7pm go to [email or web address] - password [email or web address]  And look for the night coverage person covering for me after hours  [facility] Office  [phone]

## 2017-07-21 NOTE — Progress Notes (Signed)
Bilateral ABI.TBI completed. ABIs and TBIs are normal  With normal Doppler waveforms bilaterally at rest. Toma Copier, RVS 07/21/2017, 3:48 PM

## 2017-07-22 LAB — BPAM RBC
Blood Product Expiration Date: 201904092359
Blood Product Expiration Date: 201904112359
ISSUE DATE / TIME: 201903231550
ISSUE DATE / TIME: 201903222220
UNIT TYPE AND RH: 5100
UNIT TYPE AND RH: 5100

## 2017-07-22 LAB — CBC
HCT: 27.2 % — ABNORMAL LOW (ref 39.0–52.0)
Hemoglobin: 8.7 g/dL — ABNORMAL LOW (ref 13.0–17.0)
MCH: 29.6 pg (ref 26.0–34.0)
MCHC: 32 g/dL (ref 30.0–36.0)
MCV: 92.5 fL (ref 78.0–100.0)
PLATELETS: 134 10*3/uL — AB (ref 150–400)
RBC: 2.94 MIL/uL — ABNORMAL LOW (ref 4.22–5.81)
RDW: 26.5 % — AB (ref 11.5–15.5)
WBC: 5 10*3/uL (ref 4.0–10.5)

## 2017-07-22 LAB — BASIC METABOLIC PANEL
Anion gap: 8 (ref 5–15)
BUN: 15 mg/dL (ref 6–20)
CALCIUM: 8 mg/dL — AB (ref 8.9–10.3)
CO2: 23 mmol/L (ref 22–32)
Chloride: 108 mmol/L (ref 101–111)
Creatinine, Ser: 2.03 mg/dL — ABNORMAL HIGH (ref 0.61–1.24)
GFR calc Af Amer: 33 mL/min — ABNORMAL LOW (ref >60)
GFR, EST NON AFRICAN AMERICAN: 28 mL/min — AB (ref >60)
Glucose, Bld: 95 mg/dL (ref 65–99)
Potassium: 4.2 mmol/L (ref 3.5–5.1)
Sodium: 139 mmol/L (ref 135–145)

## 2017-07-22 LAB — PARATHYROID HORMONE, INTACT (NO CA): PTH: 22 pg/mL (ref 15–65)

## 2017-07-22 LAB — TYPE AND SCREEN
ABO/RH(D): O POS
ANTIBODY SCREEN: NEGATIVE
Unit division: 0
Unit division: 0

## 2017-07-22 LAB — HAPTOGLOBIN: HAPTOGLOBIN: 357 mg/dL — AB (ref 34–200)

## 2017-07-22 MED ORDER — PREMIER PROTEIN SHAKE
2.0000 [oz_av] | Freq: Two times a day (BID) | ORAL | Status: DC
  Administered 2017-07-22: 2 [oz_av] via ORAL
  Filled 2017-07-22 (×4): qty 325.31

## 2017-07-22 MED ORDER — VITAMIN B-12 1000 MCG PO TABS: 1000.0000 ug | ORAL_TABLET | Freq: Every day | ORAL | 0 refills | Status: AC

## 2017-07-22 MED ORDER — CYANOCOBALAMIN 1000 MCG/ML IJ SOLN
1000.0000 ug | Freq: Once | INTRAMUSCULAR | Status: AC
  Administered 2017-07-22: 1000 ug via INTRAMUSCULAR
  Filled 2017-07-22: qty 1

## 2017-07-22 NOTE — Progress Notes (Signed)
Nsg Discharge Note  Admit Date:  07/20/2017 Discharge date: 07/22/2017   PAUL TRETTIN to be D/C'd Home per MD order.  AVS completed.  Copy for chart, and copy for patient signed, and dated. Patient/caregiver able to verbalize understanding.  Discharge Medication: Allergies as of 07/22/2017   No Known Allergies     Medication List    TAKE these medications   amLODipine 5 MG tablet Commonly known as:  NORVASC Take 1 tablet (5 mg total) by mouth daily. What changed:  when to take this   aspirin EC 81 MG tablet Take 1 tablet (81 mg total) by mouth daily. What changed:  when to take this   isosorbide-hydrALAZINE 20-37.5 MG tablet Commonly known as:  BIDIL Take 1 tablet by mouth 3 (three) times daily. What changed:  when to take this   metoprolol succinate 25 MG 24 hr tablet Commonly known as:  TOPROL-XL Take 1 tablet (25 mg total) by mouth daily. What changed:  when to take this   nitroGLYCERIN 0.4 MG SL tablet Commonly known as:  NITROSTAT Place 1 tablet (0.4 mg total) under the tongue every 5 (five) minutes as needed for chest pain (3 maximum before seeking care).   tamsulosin 0.4 MG Caps capsule Commonly known as:  FLOMAX TAKE 1 CAPSULE(0.4 MG) BY MOUTH DAILY What changed:  See the new instructions.   vitamin B-12 1000 MCG tablet Commonly known as:  CYANOCOBALAMIN Take 1 tablet (1,000 mcg total) by mouth daily.       Discharge Assessment: Vitals:   07/21/17 2154 07/22/17 0636  BP: (!) 121/54 (!) 123/54  Pulse: 79 74  Resp: 17 18  Temp: 99.3 F (37.4 C) 98.9 F (37.2 C)  SpO2: 97% 97%   Skin clean, dry and intact without evidence of skin break down, no evidence of skin tears noted. IV catheter discontinued intact. Site without signs and symptoms of complications - no redness or edema noted at insertion site, patient denies c/o pain - only slight tenderness at site.  Dressing with slight pressure applied.  D/c Instructions-Education: Discharge instructions  given to patient/family with verbalized understanding. D/c education completed with patient/family including follow up instructions, medication list, d/c activities limitations if indicated, with other d/c instructions as indicated by MD - patient able to verbalize understanding, all questions fully answered. Patient instructed to return to ED, call 911, or call MD for any changes in condition.  Patient escorted via [place], and D/C home via private auto.  Salley Slaughter, RN 07/22/2017 2:18 PM

## 2017-07-22 NOTE — Progress Notes (Signed)
Patient ambulated from room around circle without complications

## 2017-07-22 NOTE — Discharge Summary (Signed)
Physician Discharge Summary  Tony [name] HKV:[id number] DOB: 1931/01/09 DOA: 07/20/2017  PCP: [name], MD  Admit date: 07/20/2017 Discharge date: 07/22/2017  Time spent: 45 minutes  Recommendations for Outpatient Follow-up:  Patient will be discharged to home.  Patient will need to follow up with primary care provider within one week of discharge.  Follow up with Dr. [name], hematologist, on 4/1. Patient should continue medications as prescribed.  Patient should follow a regular diet.   Discharge Diagnoses:  Macrocytic Anemia/Symptomatic anemia Acute kidney injury on chronic kidney disease, stage III Thrombocytopenia Elevated alk phos CAD Essential hypertension Claudication/lower extremity pain History of CVA BPH  Discharge Condition: Stable  Diet recommendation: regular  Filed Weights   07/21/17 0133  Weight: 87.9 kg (193 lb 12.5 oz)    History of present illness:  on 07/20/2017 by Dr. Genoveva Ill [name] a 84 y.o.malewith medical history significant forcad/cva (in 2012, stent in LAD), HTN, ckd3, bph, who presents with above cc.  For about a month has experienced worsening fatigue and dyspnea with exertion. Also has developed anterior and posterior thigh pain, an aching pain, with ambulation, that resolves with rest. No chest pain or palpitations. No new medications including OTCs. No hemoptysis or hematemesis. No hematochezia, melena, or maroon-colored stools. No hematuria. No abd pain or distention. No back pain. Drank heavily for a few years as a young man but otherwise no sig drinking history. No hx liver disease or thyroid disease. Tries to limit oral intake 2/2 incontinence. But otherwise no sig recent limits on intake, no vomiting or diarrhea.  Presented to pcp several weeks ago. W/u there has revealed macrocytic anemia, further labs have not been revealing. Has been referred to hematology, has visit there scheduled 4/1.  Hospital Course:    Macrocytic Anemia/Symptomatic anemia -currently hemodynamically stable -presented with hemoglobin 7 (hemoglobin in July 2018 14.3) -given 3u PRBC, hemoglobin currently 8.7 -Patient was seeing his primary care physician for this and was referred to hematology as an outpatient, upcoming appointment on 07/30/2017 -Concern for myelodysplastic process given anemia and thrombocytopenia -FOBT negative -B12 low normal (given dose of Vit B12), TSH within normal limits -Discussed with hematology, Dr. Alen Blew, who recommended giving 1 more unit PRBC and having patient follow-up as an outpatient.  Patient may need a bone marrow biopsy which can be done as an outpatient. -Pending EPO, homocystine, folate, and reticulocyte count- may be followed by PCP or hematologist -discussed the importance of eating properly and drinking enough fluids with patient and daughter at bedside -discussed signs and symptoms of anemia with patient and daughter  Acute kidney injury on chronic kidney disease, stage III -baseline creatinine 1.4-1.8, creatinine upon admission 2.13 -creatinine 2.03 -suspect secondary to poor oral intake- patient endorses not drinking much  -Continue to monitor BMP  Thrombocytopenia -See discussion above -Pending peripheral smear -platelets 134  Elevated alk phos -Possibly related to the above -LFTs within normal limits -Calcium low normal -PTH and vitamin D levels pending- may be followed by PCP  CAD -Currently no chest pain -continue metoprolol -aspirin held due to anemia- restart on discharge as FOBT neg -statin held due to lower ext pain  Essential hypertension -Amlodipine and BiDil held due to symptomatic anemia- resume at discharge -Continue metoprolol  Claudication/lower extremity pain -Patient has been following with his PCP for this -Statin held -ABI normal -CK 35  History of CVA -Aspirin held due to anemia- resume at dischage  BPH -Continue  Flomax  Consultants Hematology, Dr.  Shadad, via phone  Procedures  None  Discharge Exam: Vitals:   07/21/17 2154 07/22/17 0636  BP: (!) 121/54 (!) 123/54  Pulse: 79 74  Resp: 17 18  Temp: 99.3 F (37.4 C) 98.9 F (37.2 C)  SpO2: 97% 97%   Patient seen and examined on day of discharge.  States he is feeling much better and able to walk without much pain.  Denies current chest pain, shortness of breath, abdominal pain, nausea or vomiting, diarrhea or constipation, dizziness or headache.   General: Well developed, well nourished, NAD, appears stated age  84: NCAT, mucous membranes moist.  Neck: Supple  Cardiovascular: S1 S2 auscultated, RRR, SEM  Respiratory: Clear to auscultation bilaterally with equal chest rise  Abdomen: Soft, nontender, nondistended, + bowel sounds  Extremities: warm dry without cyanosis clubbing  Neuro: AAOx3, nonfocal  Skin: Without rashes exudates or nodules  Psych: Normal affect and demeanor with intact judgement and insight, pleasant    Discharge Instructions Discharge Instructions    Discharge instructions   Complete by:  As directed    Patient will be discharged to home.  Patient will need to follow up with primary care provider within one week of discharge.  Follow up with Dr. [name], hematologist, on 4/1. Patient should continue medications as prescribed.  Patient should follow a regular diet.     Allergies as of 07/22/2017   No Known Allergies     Medication List    TAKE these medications   amLODipine 5 MG tablet Commonly known as:  NORVASC Take 1 tablet (5 mg total) by mouth daily. What changed:  when to take this   aspirin EC 81 MG tablet Take 1 tablet (81 mg total) by mouth daily. What changed:  when to take this   isosorbide-hydrALAZINE 20-37.5 MG tablet Commonly known as:  BIDIL Take 1 tablet by mouth 3 (three) times daily. What changed:  when to take this   metoprolol succinate 25 MG 24 hr tablet Commonly known  as:  TOPROL-XL Take 1 tablet (25 mg total) by mouth daily. What changed:  when to take this   nitroGLYCERIN 0.4 MG SL tablet Commonly known as:  NITROSTAT Place 1 tablet (0.4 mg total) under the tongue every 5 (five) minutes as needed for chest pain (3 maximum before seeking care).   tamsulosin 0.4 MG Caps capsule Commonly known as:  FLOMAX TAKE 1 CAPSULE(0.4 MG) BY MOUTH DAILY What changed:  See the new instructions.   vitamin B-12 1000 MCG tablet Commonly known as:  CYANOCOBALAMIN Take 1 tablet (1,000 mcg total) by mouth daily.      No Known Allergies Follow-up Information    [name], MD. Schedule an appointment as soon as possible for a visit in 1 week(s).   Specialty:  Family Medicine Why:  Hospital follow up Contact information: [place] [address] [phone]        Brunetta Genera, MD. Go on 07/30/2017.   Specialties:  Hematology, Oncology Contact information: [place] [address] [phone]            The results of significant diagnostics from this hospitalization (including imaging, microbiology, ancillary and laboratory) are listed below for reference.    Significant Diagnostic Studies: Dg Lumbar Spine 2-3 Views  Result Date: 07/06/2017 CLINICAL DATA:  Bilateral low back pain x 2 weeks. No radiculopathy. No injury. EXAM: LUMBAR SPINE - 2-3 VIEW COMPARISON:  None. FINDINGS: There are transitional lumbosacral and thoracolumbar vertebra  with 4 non rib-bearing lumbar type vertebral bodies. No fracture. There is generalized increased density throughout the visualized spine, but no osteolytic lesions. Grade 1 anterolisthesis of L4 on L5 where there is moderate loss of disc height. No other spondylolisthesis. Remaining disc spaces are well preserved. Soft tissues are unremarkable. IMPRESSION: 1. No fracture or acute finding. 2. Disc and facet degenerative changes at L4-L5 with moderate loss of disc height and  grade 1 anterolisthesis. 3. Generalized increased density of the visualized spine for this patient's age. This is nonspecific. No discrete bone lesion. Electronically Signed   By: Lajean Manes M.D.   On: 07/06/2017 15:24    Microbiology: No results found for this or any previous visit (from the past 240 hour(s)).   Labs: Basic Metabolic Panel: Recent Labs  Lab 07/20/17 1600 07/20/17 2103 07/21/17 0536 07/22/17 0525  NA 136  --  134* 139  K 4.9  --  4.2 4.2  CL 103  --  103 108  CO2 24  --  22 23  GLUCOSE 130*  --  101* 95  BUN 17  --  16 15  CREATININE 2.13* 2.02* 1.85* 2.03*  CALCIUM 8.5*  --  7.9* 8.0*   Liver Function Tests: Recent Labs  Lab 07/20/17 1600 07/21/17 0536  AST 26 23  ALT 20 17  ALKPHOS 488* 433*  BILITOT 1.2 1.1  PROT 6.6 5.7*  ALBUMIN 3.3* 2.7*   No results for input(s): LIPASE, AMYLASE in the last 168 hours. No results for input(s): AMMONIA in the last 168 hours. CBC: Recent Labs  Lab 07/20/17 1600 07/20/17 2103 07/21/17 0536 07/22/17 0525  WBC 6.3 5.7 5.4 5.0  NEUTROABS 3.7  --   --   --   HGB 7.3* 7.0* 7.7* 8.7*  HCT 23.5* 22.1* 24.0* 27.2*  MCV 102.2* 102.8* 98.0 92.5  PLT 148* 141* 133* 134*   Cardiac Enzymes: Recent Labs  Lab 07/20/17 2103  CKTOTAL 35*   BNP: BNP (last 3 results) No results for input(s): BNP in the last 8760 hours.  ProBNP (last 3 results) No results for input(s): PROBNP in the last 8760 hours.  CBG: No results for input(s): GLUCAP in the last 168 hours.     Signed:  Cristal Ford  [facility] 07/22/2017, 12:28 PM

## 2017-07-22 NOTE — Discharge Instructions (Signed)
Anemia Anemia is a condition in which you do not have enough red blood cells or hemoglobin. Hemoglobin is a substance in red blood cells that carries oxygen. When you do not have enough red blood cells or hemoglobin (are anemic), your body cannot get enough oxygen and your organs may not work properly. As a result, you may feel very tired or have other problems. What are the causes? Common causes of anemia include:  Excessive bleeding. Anemia can be caused by excessive bleeding inside or outside the body, including bleeding from the intestine or from periods in women.  Poor nutrition.  Long-lasting (chronic) kidney, thyroid, and liver disease.  Bone marrow disorders.  Cancer and treatments for cancer.  HIV (human immunodeficiency virus) and AIDS (acquired immunodeficiency syndrome).  Treatments for HIV and AIDS.  Spleen problems.  Blood disorders.  Infections, medicines, and autoimmune disorders that destroy red blood cells.  What are the signs or symptoms? Symptoms of this condition include:  Minor weakness.  Dizziness.  Headache.  Feeling heartbeats that are irregular or faster than normal (palpitations).  Shortness of breath, especially with exercise.  Paleness.  Cold sensitivity.  Indigestion.  Nausea.  Difficulty sleeping.  Difficulty concentrating.  Symptoms may occur suddenly or develop slowly. If your anemia is mild, you may not have symptoms. How is this diagnosed? This condition is diagnosed based on:  Blood tests.  Your medical history.  A physical exam.  Bone marrow biopsy.  Your health care provider may also check your stool (feces) for blood and may do additional testing to look for the cause of your bleeding. You may also have other tests, including:  Imaging tests, such as a CT scan or MRI.  Endoscopy.  Colonoscopy.  How is this treated? Treatment for this condition depends on the cause. If you continue to lose a lot of blood,  you may need to be treated at a hospital. Treatment may include:  Taking supplements of iron, vitamin T02, or folic acid.  Taking a hormone medicine (erythropoietin) that can help to stimulate red blood cell growth.  Having a blood transfusion. This may be needed if you lose a lot of blood.  Making changes to your diet.  Having surgery to remove your spleen.  Follow these instructions at home:  Take over-the-counter and prescription medicines only as told by your health care provider.  Take supplements only as told by your health care provider.  Follow any diet instructions that you were given.  Keep all follow-up visits as told by your health care provider. This is important. Contact a health care provider if:  You develop new bleeding anywhere in the body. Get help right away if:  You are very weak.  You are short of breath.  You have pain in your abdomen or chest.  You are dizzy or feel faint.  You have trouble concentrating.  You have bloody or black, tarry stools.  You vomit repeatedly or you vomit up blood. Summary  Anemia is a condition in which you do not have enough red blood cells or enough of a substance in your red blood cells that carries oxygen (hemoglobin).  Symptoms may occur suddenly or develop slowly.  If your anemia is mild, you may not have symptoms.  This condition is diagnosed with blood tests as well as a medical history and physical exam. Other tests may be needed.  Treatment for this condition depends on the cause of the anemia. This information is not intended to replace advice  given to you by your health care provider. Make sure you discuss any questions you have with your health care provider. °Document Released: 05/25/2004 Document Revised: 05/19/2016 Document Reviewed: 05/19/2016 °Elsevier Interactive Patient Education © 2018 [employer] ° °

## 2017-07-23 LAB — HOMOCYSTEINE: HOMOCYSTEINE-NORM: 15.8 umol/L — AB (ref 0.0–15.0)

## 2017-07-23 LAB — FOLATE RBC
FOLATE, HEMOLYSATE: 371.8 ng/mL
FOLATE, RBC: 1698 ng/mL (ref >498)
HEMATOCRIT: 21.9 % — AB (ref 37.5–51.0)

## 2017-07-23 LAB — HCV INTERPRETATION

## 2017-07-23 LAB — ERYTHROPOIETIN: Erythropoietin: 249.8 m[IU]/mL — ABNORMAL HIGH (ref 2.6–18.5)

## 2017-07-23 LAB — VITAMIN D 25 HYDROXY (VIT D DEFICIENCY, FRACTURES): Vit D, 25-Hydroxy: 14.1 ng/mL — ABNORMAL LOW (ref 30.0–100.0)

## 2017-07-23 LAB — PATHOLOGIST SMEAR REVIEW

## 2017-07-23 LAB — HCV AB W REFLEX TO QUANT PCR

## 2017-07-24 ENCOUNTER — Telehealth: Payer: Self-pay | Admitting: *Deleted

## 2017-07-24 NOTE — Telephone Encounter (Signed)
Per chart review: Admit date: 07/20/2017 Discharge date: 07/22/2017  Time spent: 45 minutes  Recommendations for Outpatient Follow-up:  Patient will be discharged to home.  Patient will need to follow up with primary care provider within one week of discharge.  Follow up with Dr. [name], hematologist, on 4/1. Patient should continue medications as prescribed.  Patient should follow a regular diet.   Discharge Diagnoses:  Macrocytic Anemia/Symptomatic anemia Acute kidney injury on chronic kidney disease, stage III Thrombocytopenia Elevated alk phos CAD Essential hypertension Claudication/lower extremity pain History of CVA BPH  Discharge Condition: Stable  Diet recommendation: regular ___________________________________________________________ Per telephone call: Transition Care Management Follow-up Telephone Call   Date discharged? 07/22/17   How have you been since you were released from the hospital? "feeling better" Spoke with patients daughter. She states that he has been walking and is feeling better but it still weak.   Do you understand why you were in the hospital? yes   Do you understand the discharge instructions? yes   Where were you discharged to? Home   Items Reviewed:  Medications reviewed: yes. He has not started the B12 supplement yet, awaiting arrival from pharmacy.  Allergies reviewed: yes  Dietary changes reviewed: yes  Referrals reviewed: yes   Functional Questionnaire:   Activities of Daily Living (ADLs):   He states they are independent in the following: ambulation, bathing and hygiene, feeding, continence, grooming, toileting and dressing States they require assistance with the following: none. Patients daughter states that he is weak but is able to remain independent.    Any transportation issues/concerns?: no   Any patient concerns? no   Confirmed importance and date/time of follow-up visits scheduled yes  Provider  Appointment booked with Dr [name] 07/26/17 10:40.  Confirmed with patient if condition begins to worsen call PCP or go to the ER.  Patient was given the office number and encouraged to call back with question or concerns.  : yes

## 2017-07-25 NOTE — Consult Note (Signed)
            [facility] CM Primary Care Navigator  07/25/2017  JAYZEN PAVER 1930/08/03 [id number]  Attempt to seepatient at the bedside to identify possible discharge needsbuthe was already dischargedhomeperstaffreport.  Patient was seen for symptomatic anemia with worsening fatigue and dyspnea on exertion.  Primary care provider's officeis listed as providingtransition of care (TOC)follow-up.   Patient has discharge instruction to follow-up with primary care provider in 1 week.   For additional questions please contact:  Edwena Felty A. Floella Ensz, BSN, RN-BC [facility] PRIMARY CARE Navigator Cell: [phone]

## 2017-07-26 ENCOUNTER — Encounter: Payer: Self-pay | Admitting: Obstetrics and Gynecology

## 2017-07-26 ENCOUNTER — Encounter: Payer: Self-pay | Admitting: Family Medicine

## 2017-07-26 ENCOUNTER — Ambulatory Visit (INDEPENDENT_AMBULATORY_CARE_PROVIDER_SITE_OTHER): Payer: Medicare Other | Admitting: Family Medicine

## 2017-07-26 VITALS — BP 136/74 | HR 76 | Temp 98.0°F | Wt 189.0 lb

## 2017-07-26 DIAGNOSIS — I739 Peripheral vascular disease, unspecified: Secondary | ICD-10-CM

## 2017-07-26 DIAGNOSIS — D696 Thrombocytopenia, unspecified: Secondary | ICD-10-CM

## 2017-07-26 DIAGNOSIS — I10 Essential (primary) hypertension: Secondary | ICD-10-CM | POA: Diagnosis not present

## 2017-07-26 DIAGNOSIS — D539 Nutritional anemia, unspecified: Secondary | ICD-10-CM

## 2017-07-26 DIAGNOSIS — I2510 Atherosclerotic heart disease of native coronary artery without angina pectoris: Secondary | ICD-10-CM | POA: Diagnosis not present

## 2017-07-26 LAB — POCT HEMOGLOBIN: Hemoglobin: 8.9 g/dL — AB (ref 14.1–18.1)

## 2017-07-26 NOTE — Assessment & Plan Note (Signed)
At goal. Continue current medications. 

## 2017-07-26 NOTE — Assessment & Plan Note (Signed)
Hemoglobin stable today.  Concern for underlying myelodysplastic syndrome.  He will be seeing hematology next week.  Discussed reasons to seek emergent care.

## 2017-07-26 NOTE — Assessment & Plan Note (Signed)
Likely secondary to anemia.  Had ABIs in the hospital which were normal.

## 2017-07-26 NOTE — Patient Instructions (Signed)
Your hemoglobin today is stable.  We do not need to make any other changes.  Please keep your appointment with the blood doctor on 4/1.  Take care, Dr [name]

## 2017-07-26 NOTE — Progress Notes (Signed)
HEMATOLOGY/ONCOLOGY CONSULTATION NOTE  Date of Service: 07/30/2017  Patient Care Team: [name], MD as PCP - General (Family Medicine)  CHIEF COMPLAINTS/PURPOSE OF CONSULTATION:  Anemia  HISTORY OF PRESENTING ILLNESS:   Tony [name] is a wonderful 84 y.o. male who has been referred to [place] by Dr Dimas Chyle for evaluation and management of anemia. He is accompanied today by his daughter. The pt reports that he is doing well overall.   The pt reports a new onset of fatigue that began in January 2019. His daughter notes being able to tell a difference in his energy levels as early as November 2018. He notes that some cramping pain in his thighs. He notes that his recent 07/23/17 blood transfusion has led to a "terrific, immediate change" in his thigh pain. However, his thigh pain has returned in the last couple days.  He notes that he takes 107mg Vitamin B12 daily.   He notes that for 6-7 years he took iron pills after his 2012 heart attack and stroke. He notes that he took a statin for 6-7 months and was taken off of it recently. He notes no unresolved symptoms from his stroke.   The pt notes that over the last 4-5 months his appetite has diminished and he has subsequently lost about 20 lbs in that time.  He notes that he has historically not preferred to drink water as such, but hydrates with juice, coffee, and other drinks.  He notes that he believes that he is able to take care of himself adequately at home. He also notes that his cane is sufficient for helping him get around.   Most recent lab results (07/22/17) of CBC  is as follows: all values are WNL except for RBC at 2.94, Hgb at 8.7, HCT at 27.2, RDW at 26.5, Platelets at 134k. CBC from 10/30/16 revealed all values WNL.  CMP 07/21/17 revealed all values WNL except for Sodium at 134, Glucose at 101, Creatinine at 1.85, Calcium at 7.9, Total Protein at 5.7, Albumin at 2.7, Alk Phos at 433.  LDH 07/20/17 elevated at  256. Haptoglobin 07/20/17 elevated at 357.  Vitamin B12 07/06/17 is WNL at 229.   On review of systems, pt reports decreased appetite, losing 20 lbs over 4-5 months, bilateral thigh pain, fatigue, mild leg swelling, and denies fevers, chills, night sweats, bone pains, nausea, abdominal pains, bleeding, blood in the urine, blood in the stools, black stools, changes in bowel habits, light headednss, dizziness, abdominal pains, noticing any new lumps or bumps, testicular pain or swelling, and any other symptoms.   On PMHx the pt reports heart attack and stroke in 2012. On Social Hx the pt denies much ETOH consumption.    MEDICAL HISTORY:  Past Medical History:  Diagnosis Date  . BPH associated with nocturia    flomax 0.4--> 0.8 mg trial. nocturia if has coffee. some incontinence  . CAD (coronary artery disease)    LAD Stent 2012. 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   . Gout    no rx. apparently 1x in past  . History of stroke    no aspirin before stroke. no deficits. slurred words at time of stroke  . Hyperlipidemia   . Hypertension     SURGICAL HISTORY: Past Surgical History:  Procedure Laterality Date  . CARDIAC SURGERY     Stint  . CORONARY STENT PLACEMENT    . left arm fracture s/p surgery    .  right leg fracture s.p surgery- screws like arm      SOCIAL HISTORY: Social History   Socioeconomic History  . Marital status: Married    Spouse name: Not on file  . Number of children: Not on file  . Years of education: Not on file  . Highest education level: Not on file  Occupational History  . Not on file  Social Needs  . Financial resource strain: Not on file  . Food insecurity:    Worry: Not on file    Inability: Not on file  . Transportation needs:    Medical: Not on file    Non-medical: Not on file  Tobacco Use  . Smoking status: Former Smoker    Packs/day: 0.50    Years: 1.00    Pack years: 0.50    Types: Cigarettes    Last attempt to  quit: 05/01/1952    Years since quitting: 65.2  . Smokeless tobacco: Never Used  Substance and Sexual Activity  . Alcohol use: No  . Drug use: No  . Sexual activity: Not on file  Lifestyle  . Physical activity:    Days per week: Not on file    Minutes per session: Not on file  . Stress: Not on file  Relationships  . Social connections:    Talks on phone: Not on file    Gets together: Not on file    Attends religious service: Not on file    Active member of club or organization: Not on file    Attends meetings of clubs or organizations: Not on file    Relationship status: Not on file  . Intimate partner violence:    Fear of current or ex partner: Not on file    Emotionally abused: Not on file    Physically abused: Not on file    Forced sexual activity: Not on file  Other Topics Concern  . Not on file  Social History Narrative   Married- lives separate from wife. 2 children. Daughter passed from heart attack.       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 Breast cancer Mother   . Heart disease Father   . Heart disease Brother        x2  . Prostate cancer Brother     ALLERGIES:  has No Known Allergies.  MEDICATIONS:  Current Outpatient Medications  Medication Sig Dispense Refill  . amLODipine (NORVASC) 5 MG tablet Take 1 tablet (5 mg total) by mouth daily. (Patient taking differently: Take 5 mg by mouth at bedtime. ) 90 tablet 3  . aspirin EC 81 MG tablet Take 1 tablet (81 mg total) by mouth daily. (Patient taking differently: Take 81 mg by mouth at bedtime. ) 90 tablet 3  . isosorbide-hydrALAZINE (BIDIL) 20-37.5 MG tablet Take 1 tablet by mouth 3 (three) times daily. (Patient taking differently: Take 1 tablet by mouth 2 (two) times daily. ) 270 tablet 1  . metoprolol succinate (TOPROL-XL) 25 MG 24 hr tablet Take 1 tablet (25 mg total) by mouth daily. (Patient taking  differently: Take 25 mg by mouth at bedtime. ) 90 tablet 3  . nitroGLYCERIN (NITROSTAT) 0.4 MG SL tablet Place 1 tablet (0.4 mg total) under the tongue every 5 (five) minutes as needed for chest pain (3 maximum before seeking care). 30 tablet 0  . tamsulosin (FLOMAX) 0.4 MG  CAPS capsule TAKE 1 CAPSULE(0.4 MG) BY MOUTH DAILY (Patient taking differently: TAKE 1 CAPSULE(0.4 MG) BY MOUTH in the evening) 90 capsule 1  . vitamin B-12 (CYANOCOBALAMIN) 1000 MCG tablet Take 1 tablet (1,000 mcg total) by mouth daily. 30 tablet 0   No current facility-administered medications for this visit.     REVIEW OF SYSTEMS:    10 Point review of Systems was done is negative except as noted above.  PHYSICAL EXAMINATION:  . Vitals:   07/30/17 1304  BP: (!) 146/58  Pulse: 93  Resp: 18  Temp: 97.7 F (36.5 C)  SpO2: 99%   Filed Weights   07/30/17 1304  Weight: 188 lb 3.2 oz (85.4 kg)   .Body mass index is 29.48 kg/m.  GENERAL: elderly gentleman, alert, in no acute distress and comfortable SKIN: no acute rashes, no significant lesions EYES: conjunctival pallor noted, sclera anicteric OROPHARYNX: MMM, no exudates, no oropharyngeal erythema or ulceration NECK: supple, no JVD LYMPH:  no palpable lymphadenopathy in the cervical, axillary or inguinal regions LUNGS: clear to auscultation b/l with normal respiratory effort HEART: regular rate & rhythm ABDOMEN:  normoactive bowel sounds , non tender, not distended, no palpable hepato/splenomegaly.  Extremity: 1+ pedal edema PSYCH: alert & oriented x 3 with fluent speech NEURO: no focal motor/sensory deficits  LABORATORY DATA:  I have reviewed the data as listed  . CBC Latest Ref Rng & Units 07/31/2017 07/30/2017 07/26/2017  WBC 4.0 - 10.5 K/uL 6.1 5.9 -  Hemoglobin 13.0 - 17.0 g/dL 9.6(L) - 8.9(A)  Hematocrit 39.0 - 52.0 % 30.4(L) 30.8(L) -  Platelets 150 - 400 K/uL 233 227 -   . CBC    Component Value Date/Time   WBC 6.1 07/31/2017 0917   RBC  3.22 (L) 07/31/2017 0917   HGB 9.6 (L) 07/31/2017 0917   HCT 30.4 (L) 07/31/2017 0917   HCT 21.9 (L) 07/20/2017 2103   PLT 233 07/31/2017 0917   PLT 227 07/30/2017 1506   MCV 94.4 07/31/2017 0917   MCH 29.8 07/31/2017 0917   MCHC 31.6 07/31/2017 0917   RDW 24.6 (H) 07/31/2017 0917   LYMPHSABS 1.7 07/31/2017 0917   MONOABS 0.5 07/31/2017 0917   EOSABS 0.1 07/31/2017 0917   BASOSABS 0.0 07/31/2017 0917     . CMP Latest Ref Rng & Units 07/31/2017 07/30/2017 07/22/2017  Glucose 65 - 99 mg/dL 115(H) 106 95  BUN 6 - 20 mg/dL 28(H) 28(H) 15  Creatinine 0.61 - 1.24 mg/dL 1.99(H) 1.95(H) 2.03(H)  Sodium 135 - 145 mmol/L 137 139 139  Potassium 3.5 - 5.1 mmol/L 4.9 4.4 4.2  Chloride 101 - 111 mmol/L 105 107 108  CO2 22 - 32 mmol/L '24 25 23  '$ Calcium 8.9 - 10.3 mg/dL 8.8(L) 9.2 8.0(L)  Total Protein 6.4 - 8.3 g/dL - 7.0 -  Total Bilirubin 0.2 - 1.2 mg/dL - 0.6 -  Alkaline Phos 40 - 150 U/L - 718(H) -  AST 5 - 34 U/L - 19 -  ALT 0 - 55 U/L - 16 -   Component     Latest Ref Rng & Units 07/30/2017  IgG (Immunoglobin G), Serum     700 - 1,600 mg/dL 1,060  IgA     61 - 437 mg/dL 140  IgM (Immunoglobulin M), Srm     15 - 143 mg/dL 34  Total Protein ELP     6.0 - 8.5 g/dL 6.4  Albumin SerPl Elph-Mcnc     2.9 - 4.4 g/dL 3.3  Alpha 1     0.0 - 0.4 g/dL 0.3  Alpha2 Glob SerPl Elph-Mcnc     0.4 - 1.0 g/dL 1.0  B-Globulin SerPl Elph-Mcnc     0.7 - 1.3 g/dL 0.9  Gamma Glob SerPl Elph-Mcnc     0.4 - 1.8 g/dL 0.9  M Protein SerPl Elph-Mcnc     Not Observed g/dL Not Observed  Globulin, Total     2.2 - 3.9 g/dL 3.1  Albumin/Glob SerPl     0.7 - 1.7 1.1  IFE 1      Comment  Please Note (HCV):      Comment  Kappa free light chain     3.3 - 19.4 mg/L 41.6 (H)  Lamda free light chains     5.7 - 26.3 mg/L 29.6 (H)  Kappa, lamda light chain ratio     0.26 - 1.65 1.41  Retic Ct Pct     0.8 - 1.8 % 2.1 (H)  RBC.     4.20 - 5.82 MIL/uL 3.23 (L)  Retic Count, Absolute     34.8 - 93.9 K/uL  67.8  Sed Rate     0 - 16 mm/hr 98 (H)  LDH     125 - 245 U/L 307 (H)  Prostate Specific Ag, Serum     0.0 - 4.0 ng/mL 1,186.0 (H)      RADIOGRAPHIC STUDIES: I have personally reviewed the radiological images as listed and agreed with the findings in the report. Ct Biopsy  Result Date: 07/31/2017 INDICATION: History of severe macrocytic anemia and thrombocytopenia, evaluate for MDS or other infiltrative process. EXAM: CT-GUIDED BONE MARROW BIOPSY AND ASPIRATION MEDICATIONS: None ANESTHESIA/SEDATION: Fentanyl 100 mcg IV; Versed 2 mg IV Sedation Time: 15 Minutes; The patient was continuously monitored during the procedure by the interventional radiology nurse under my direct supervision. COMPLICATIONS: None immediate. PROCEDURE: Informed consent was obtained from the patient following an explanation of the procedure, risks, benefits and alternatives. The patient understands, agrees and consents for the procedure. All questions were addressed. A time out was performed prior to the initiation of the procedure. The patient was positioned prone and non-contrast localization CT was performed of the pelvis to demonstrate the iliac marrow spaces. The operative site was prepped and draped in the usual sterile fashion. Under sterile conditions and local anesthesia, a 22 gauge spinal needle was utilized for procedural planning. Next, an 11 gauge coaxial bone biopsy needle was advanced into the left iliac marrow space. Needle position was confirmed with CT imaging. Initially, bone marrow aspiration was performed. Next, a bone marrow biopsy was obtained with the 11 gauge outer bone marrow device. The 11 gauge coaxial bone biopsy needle was re-advanced into a slightly different location within the left iliac marrow space, positioning was confirmed and an additional bone marrow biopsy was obtained. Samples were prepared with the cytotechnologist and deemed adequate. The needle was removed intact. Hemostasis was  obtained with compression and a dressing was placed. The patient tolerated the procedure well without immediate post procedural complication. FINDINGS: Multiple sclerotic lesions are seen throughout the imaged pelvis, sacrum and lower lumbar spine. Index lesion within the left ilium measures approximately 1.5 x 1.2 cm (image 19, series 2). IMPRESSION: 1. Successful CT guided left iliac bone marrow aspiration and core biopsy. 2. CT imaging demonstrates multiple sclerotic lesions throughout the imaged pelvis, sacrum and imaged lower lumbar spine. Electronically Signed   By: Sandi Mariscal M.D.   On: 07/31/2017 12:28   Ct Bone  Marrow Biopsy & Aspiration  Result Date: 07/31/2017 INDICATION: History of severe macrocytic anemia and thrombocytopenia, evaluate for MDS or other infiltrative process. EXAM: CT-GUIDED BONE MARROW BIOPSY AND ASPIRATION MEDICATIONS: None ANESTHESIA/SEDATION: Fentanyl 100 mcg IV; Versed 2 mg IV Sedation Time: 15 Minutes; The patient was continuously monitored during the procedure by the interventional radiology nurse under my direct supervision. COMPLICATIONS: None immediate. PROCEDURE: Informed consent was obtained from the patient following an explanation of the procedure, risks, benefits and alternatives. The patient understands, agrees and consents for the procedure. All questions were addressed. A time out was performed prior to the initiation of the procedure. The patient was positioned prone and non-contrast localization CT was performed of the pelvis to demonstrate the iliac marrow spaces. The operative site was prepped and draped in the usual sterile fashion. Under sterile conditions and local anesthesia, a 22 gauge spinal needle was utilized for procedural planning. Next, an 11 gauge coaxial bone biopsy needle was advanced into the left iliac marrow space. Needle position was confirmed with CT imaging. Initially, bone marrow aspiration was performed. Next, a bone marrow biopsy was  obtained with the 11 gauge outer bone marrow device. The 11 gauge coaxial bone biopsy needle was re-advanced into a slightly different location within the left iliac marrow space, positioning was confirmed and an additional bone marrow biopsy was obtained. Samples were prepared with the cytotechnologist and deemed adequate. The needle was removed intact. Hemostasis was obtained with compression and a dressing was placed. The patient tolerated the procedure well without immediate post procedural complication. FINDINGS: Multiple sclerotic lesions are seen throughout the imaged pelvis, sacrum and lower lumbar spine. Index lesion within the left ilium measures approximately 1.5 x 1.2 cm (image 19, series 2). IMPRESSION: 1. Successful CT guided left iliac bone marrow aspiration and core biopsy. 2. CT imaging demonstrates multiple sclerotic lesions throughout the imaged pelvis, sacrum and imaged lower lumbar spine. Electronically Signed   By: Sandi Mariscal M.D.   On: 07/31/2017 12:28    ASSESSMENT & PLAN:   84 y.o. male with  1. Normocytic Anemia -Discussed patient's most recent labs, Hgb at 8.7 on 07/22/17.  -In review of the patient's CBC records, his anemia has developed in the last 6-7 months, unaccompanied by a drop in his EPO (07/20/17 elevated at 249.8);  LDH elevated  but haptoglobin at 357 on 07/20/17. -suggested against active hemolysis. Significantly increased sed rate. Myeloma panel - no overt evidence of plasma cell dyscrasia. He has CKD but elevated EPO suggests against this being the primary etiology of his anemia. Plan  -Discussed with the patient and accompanying daughter that the labs and clinical picture appears are concerning for bone marrow issues r/o MDS or other infiltrative bone marrow pathology. -he noted some urinary symptoms so a PSA was done which is not noted to be significantly elevated - concerning for prostate cancer -Bone marrow biopsy- showed prominent involvement of the BM  with metastatic carcinoma consistent with prostatic primary - causing Myelopthisic picture. -treatment will need to be directed towards his metastatic prostate cancer - newly diagnosed.  2. Newly diagnosed metastatic prostate cancer 3. Elevated alkaline phosphatase due to bone metastases from prostate cancer -CT BM bx -concerning for significant bone lesions in lower spine and pelvis PSA levels 1186 PLAN -CT abd/pelvis was ordered--pending-- scheduled for 08/06/2017 -will add CT chest and Bone scan as well to complete staging -will need to start on Lupron and Xgeva. --We asked if he would like a referral for home  health services and he denies wanting this at this time.   Labs today CT abd/pelvis wo contrast in 4-5 days CT bone marrow aspiration/biopsy in 3-4 days RTC with Dr [name] on 08/09/2017 with CT and BM Bx   All of the patients questions were answered with apparent satisfaction. The patient knows to call the clinic with any problems, questions or concerns.  I spent 50 minutes counseling the patient face to face. The total time spent in the appointment was 60 minutes and more than 50% was on counseling and direct patient cares.    Sullivan Lone MD [place] AAHIVMS [facility] Hematology/Oncology Physician [facility]  (Office):       [phone] (Work cell):  [phone] (Fax):           [phone]  07/30/2017 12:58 PM  This document serves as a record of services personally performed by Sullivan Lone, MD. It was created on his behalf by Baldwin Jamaica, a trained medical scribe. The creation of this record is based on the scribe's personal observations and the provider's statements to them.   .I have reviewed the above documentation for accuracy and completeness, and I agree with the above. Brunetta Genera MD MS

## 2017-07-26 NOTE — Progress Notes (Signed)
Subjective:  Tony [name] is a 84 y.o. male who presents today for a TCM visit.  HPI:  Summary of Hospital admission: Reason for admission: Symptomatic macrocytic anemia Date of admission: 07/20/2017 Date of discharge: 07/22/2017 Date of Interactive contact: 07/24/2017 Summary of Hospital course: Patient presented to the ED on 07/20/2017 with worsening leg pain, fatigue, and dyspnea on exertion.  In the ED had workup revealing anemia to hemoglobin of 7.1.  He was given a total of 3 units of PRBC.  Patient's blood pressure were initially held however restarted at the time of discharge.  Patient's symptoms significantly improved with blood and he was discharged on 07/24/2017  Macrocytic anemia, thrombocytopenia Symptoms have been stable since being discharged from the hospital.  He is no longer having leg pain like he did before.  His energy levels are a little bit lower but much better than where they were last week.  He has hematology appointment scheduled for next week.  CKD stage III Patient with creatinine elevation during hospitalization.  Coronary artery disease/hypertension Patient's blood pressure meds were initially held however restarted at the time of discharge.  He has been compliant with all of his medications without obvious side effects  ROS: Per HPI, otherwise a complete review of systems was negative.   PMH:  The following were reviewed and entered/updated in epic: Past Medical History:  Diagnosis Date  . BPH associated with nocturia    flomax 0.4--> 0.8 mg trial. nocturia if has coffee. some incontinence  . CAD (coronary artery disease)    LAD Stent 2012. 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   . Gout    no rx. apparently 1x in past  . History of stroke    no aspirin before stroke. no deficits. slurred words at time of stroke  . Hyperlipidemia   . Hypertension    Patient Active Problem List   Diagnosis Date Noted  . Macrocytic anemia  07/20/2017  . AKI (acute kidney injury) ([place]) 07/20/2017  . Thrombocytopenia ([place]) 07/20/2017  . Claudication of both lower extremities ([place]) 07/20/2017  . Anemia   . History of complete AV block 10/30/2016  . Venous insufficiency 10/30/2016  . CKD (chronic kidney disease), stage III ([place]) 10/30/2016  . Hypertension   . Hyperlipidemia   . BPH associated with nocturia   . CAD (coronary artery disease). MI in 2012 with stent   . History of stroke   . Gout    Past Surgical History:  Procedure Laterality Date  . CARDIAC SURGERY     Stint  . CORONARY STENT PLACEMENT    . left arm fracture s/p surgery    . right leg fracture s.p surgery- screws like arm      Family History  Problem Relation Age of Onset  . Breast cancer Mother   . Heart disease Father   . Heart disease Brother        x2  . Prostate cancer Brother     Medications- Reconciled discharge and current medications in Epic.  Current Outpatient Medications  Medication Sig Dispense Refill  . amLODipine (NORVASC) 5 MG tablet Take 1 tablet (5 mg total) by mouth daily. (Patient taking differently: Take 5 mg by mouth at bedtime. ) 90 tablet 3  . aspirin EC 81 MG tablet Take 1 tablet (81 mg total) by mouth daily. (Patient taking differently: Take 81 mg by mouth at bedtime. ) 90 tablet 3  . isosorbide-hydrALAZINE (BIDIL) 20-37.5  MG tablet Take 1 tablet by mouth 3 (three) times daily. (Patient taking differently: Take 1 tablet by mouth 2 (two) times daily. ) 270 tablet 1  . metoprolol succinate (TOPROL-XL) 25 MG 24 hr tablet Take 1 tablet (25 mg total) by mouth daily. (Patient taking differently: Take 25 mg by mouth at bedtime. ) 90 tablet 3  . nitroGLYCERIN (NITROSTAT) 0.4 MG SL tablet Place 1 tablet (0.4 mg total) under the tongue every 5 (five) minutes as needed for chest pain (3 maximum before seeking care). 30 tablet 0  . tamsulosin (FLOMAX) 0.4 MG CAPS capsule TAKE 1 CAPSULE(0.4 MG) BY MOUTH DAILY (Patient taking differently:  TAKE 1 CAPSULE(0.4 MG) BY MOUTH in the evening) 90 capsule 1  . vitamin B-12 (CYANOCOBALAMIN) 1000 MCG tablet Take 1 tablet (1,000 mcg total) by mouth daily. 30 tablet 0   No current facility-administered medications for this visit.     Allergies-reviewed and updated No Known Allergies  Social History   Socioeconomic History  . Marital status: Married    Spouse name: Not on file  . Number of children: Not on file  . Years of education: Not on file  . Highest education level: Not on file  Occupational History  . Not on file  Social Needs  . Financial resource strain: Not on file  . Food insecurity:    Worry: Not on file    Inability: Not on file  . Transportation needs:    Medical: Not on file    Non-medical: Not on file  Tobacco Use  . Smoking status: Former Smoker    Packs/day: 0.50    Years: 1.00    Pack years: 0.50    Types: Cigarettes    Last attempt to quit: 05/01/1952    Years since quitting: 65.2  . Smokeless tobacco: Never Used  Substance and Sexual Activity  . Alcohol use: No  . Drug use: No  . Sexual activity: Not on file  Lifestyle  . Physical activity:    Days per week: Not on file    Minutes per session: Not on file  . Stress: Not on file  Relationships  . Social connections:    Talks on phone: Not on file    Gets together: Not on file    Attends religious service: Not on file    Active member of club or organization: Not on file    Attends meetings of clubs or organizations: Not on file    Relationship status: Not on file  Other Topics Concern  . Not on file  Social History Narrative   Married- lives separate from wife. 2 children. Daughter passed from heart attack.       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 Soft, Nontender, Nondistended. MSK: No edema, cyanosis, or clubbing noted Skin: Warm, dry Neuro: Grossly normal, moves all extremities Psych: Normal affect and thought content  Summary/Review of work up during hospitalization: CBC 07/22/2017: WBC 5.0, hemoglobin 8.7, platelets 134 BMET 07/22/2017: Sodium 139, potassium 4.2, chloride 108, bicarb 23, glucose 95, BUN 15, creatinine 2.03, calcium 8.0  Vascular  ABIs 07/21/2017: Normal.  Assessment/Plan:  Macrocytic anemia Hemoglobin stable today.  Concern for underlying myelodysplastic syndrome.  He will be seeing hematology next week.  Discussed reasons to seek emergent care.  Thrombocytopenia (HCC) No signs of bleeding.  Likely related to the above.  Defer further management to hematology.  Hypertension At goal.  Continue current medications.  Claudication of both lower extremities ([place]) Likely secondary to anemia.  Had ABIs in the hospital which were normal.  CAD (coronary artery disease). MI in 2012 with stent Currently asymptomatic.  Continue current medication regimen with amlodipine, BiDil, metoprolol, and aspirin.  Would transfuse to hemoglobin greater than 8 given his underlying history of CAD.  Oxygen saturation 96% on room air.  Patient has a high level of medical complexity due to number of diagnoses/treatment options and amount/complexity of data reviewed.   [name]. [name], MD 07/26/2017 12:34 PM

## 2017-07-26 NOTE — Assessment & Plan Note (Signed)
Currently asymptomatic.  Continue current medication regimen with amlodipine, BiDil, metoprolol, and aspirin.  Would transfuse to hemoglobin greater than 8 given his underlying history of CAD.  Oxygen saturation 96% on room air.

## 2017-07-26 NOTE — Assessment & Plan Note (Signed)
No signs of bleeding.  Likely related to the above.  Defer further management to hematology.

## 2017-07-30 ENCOUNTER — Inpatient Hospital Stay: Payer: Medicare Other | Attending: Hematology | Admitting: Hematology

## 2017-07-30 ENCOUNTER — Inpatient Hospital Stay: Payer: Medicare Other

## 2017-07-30 ENCOUNTER — Telehealth: Payer: Self-pay | Admitting: Hematology

## 2017-07-30 ENCOUNTER — Other Ambulatory Visit: Payer: Self-pay | Admitting: Radiology

## 2017-07-30 ENCOUNTER — Encounter: Payer: Self-pay | Admitting: Hematology

## 2017-07-30 VITALS — BP 146/58 | HR 93 | Temp 97.7°F | Resp 18 | Ht 67.0 in | Wt 188.2 lb

## 2017-07-30 DIAGNOSIS — E785 Hyperlipidemia, unspecified: Secondary | ICD-10-CM | POA: Insufficient documentation

## 2017-07-30 DIAGNOSIS — R740 Nonspecific elevation of levels of transaminase and lactic acid dehydrogenase [LDH]: Secondary | ICD-10-CM

## 2017-07-30 DIAGNOSIS — Z8673 Personal history of transient ischemic attack (TIA), and cerebral infarction without residual deficits: Secondary | ICD-10-CM | POA: Insufficient documentation

## 2017-07-30 DIAGNOSIS — I129 Hypertensive chronic kidney disease with stage 1 through stage 4 chronic kidney disease, or unspecified chronic kidney disease: Secondary | ICD-10-CM | POA: Insufficient documentation

## 2017-07-30 DIAGNOSIS — Z79899 Other long term (current) drug therapy: Secondary | ICD-10-CM | POA: Diagnosis not present

## 2017-07-30 DIAGNOSIS — R748 Abnormal levels of other serum enzymes: Secondary | ICD-10-CM

## 2017-07-30 DIAGNOSIS — R945 Abnormal results of liver function studies: Secondary | ICD-10-CM

## 2017-07-30 DIAGNOSIS — N400 Enlarged prostate without lower urinary tract symptoms: Secondary | ICD-10-CM | POA: Insufficient documentation

## 2017-07-30 DIAGNOSIS — Z87891 Personal history of nicotine dependence: Secondary | ICD-10-CM | POA: Diagnosis not present

## 2017-07-30 DIAGNOSIS — D539 Nutritional anemia, unspecified: Secondary | ICD-10-CM

## 2017-07-30 DIAGNOSIS — C61 Malignant neoplasm of prostate: Secondary | ICD-10-CM

## 2017-07-30 DIAGNOSIS — K219 Gastro-esophageal reflux disease without esophagitis: Secondary | ICD-10-CM | POA: Diagnosis not present

## 2017-07-30 DIAGNOSIS — D696 Thrombocytopenia, unspecified: Secondary | ICD-10-CM

## 2017-07-30 DIAGNOSIS — R634 Abnormal weight loss: Secondary | ICD-10-CM

## 2017-07-30 DIAGNOSIS — N189 Chronic kidney disease, unspecified: Secondary | ICD-10-CM | POA: Diagnosis not present

## 2017-07-30 DIAGNOSIS — D649 Anemia, unspecified: Secondary | ICD-10-CM

## 2017-07-30 DIAGNOSIS — C7951 Secondary malignant neoplasm of bone: Secondary | ICD-10-CM | POA: Diagnosis not present

## 2017-07-30 LAB — CBC WITH DIFFERENTIAL (CANCER CENTER ONLY)
Basophils Absolute: 0 10*3/uL (ref 0.0–0.1)
Basophils Relative: 0 %
Eosinophils Absolute: 0 10*3/uL (ref 0.0–0.5)
Eosinophils Relative: 1 %
HEMATOCRIT: 30.8 % — AB (ref 38.4–49.9)
Hemoglobin: 9.6 g/dL — ABNORMAL LOW (ref 13.0–17.1)
LYMPHS PCT: 31 %
Lymphs Abs: 1.8 10*3/uL (ref 0.9–3.3)
MCH: 29.7 pg (ref 27.2–33.4)
MCHC: 31.2 g/dL — ABNORMAL LOW (ref 32.0–36.0)
MCV: 95.4 fL (ref 79.3–98.0)
MONO ABS: 0.4 10*3/uL (ref 0.1–0.9)
MONOS PCT: 7 %
NEUTROS ABS: 3.6 10*3/uL (ref 1.5–6.5)
Neutrophils Relative %: 61 %
Platelet Count: 227 10*3/uL (ref 140–400)
RBC: 3.23 MIL/uL — AB (ref 4.20–5.82)
RDW: 24.9 % — AB (ref 11.0–14.6)
WBC: 5.9 10*3/uL (ref 4.0–10.3)

## 2017-07-30 LAB — RETICULOCYTES
RBC.: 3.23 MIL/uL — ABNORMAL LOW (ref 4.20–5.82)
RETIC COUNT ABSOLUTE: 67.8 10*3/uL (ref 34.8–93.9)
Retic Ct Pct: 2.1 % — ABNORMAL HIGH (ref 0.8–1.8)

## 2017-07-30 LAB — CMP (CANCER CENTER ONLY)
ALT: 16 U/L (ref 0–55)
AST: 19 U/L (ref 5–34)
Albumin: 3.2 g/dL — ABNORMAL LOW (ref 3.5–5.0)
Alkaline Phosphatase: 718 U/L — ABNORMAL HIGH (ref 40–150)
Anion gap: 7 (ref 3–11)
BUN: 28 mg/dL — AB (ref 7–26)
CHLORIDE: 107 mmol/L (ref 98–109)
CO2: 25 mmol/L (ref 22–29)
Calcium: 9.2 mg/dL (ref 8.4–10.4)
Creatinine: 1.95 mg/dL — ABNORMAL HIGH (ref 0.70–1.30)
GFR, Est AFR Am: 34 mL/min — ABNORMAL LOW (ref >60)
GFR, Estimated: 30 mL/min — ABNORMAL LOW (ref >60)
GLUCOSE: 106 mg/dL (ref 70–140)
POTASSIUM: 4.4 mmol/L (ref 3.5–5.1)
SODIUM: 139 mmol/L (ref 136–145)
Total Bilirubin: 0.6 mg/dL (ref 0.2–1.2)
Total Protein: 7 g/dL (ref 6.4–8.3)

## 2017-07-30 LAB — SAMPLE TO BLOOD BANK

## 2017-07-30 LAB — LACTATE DEHYDROGENASE: LDH: 307 U/L — AB (ref 125–245)

## 2017-07-30 LAB — SEDIMENTATION RATE: SED RATE: 98 mm/h — AB (ref 0–16)

## 2017-07-30 LAB — SAVE SMEAR

## 2017-07-30 NOTE — Telephone Encounter (Signed)
Appointments scheduled AVS/Calendar printed per 4/1 los °

## 2017-07-31 ENCOUNTER — Ambulatory Visit (HOSPITAL_COMMUNITY)
Admission: RE | Admit: 2017-07-31 | Discharge: 2017-07-31 | Disposition: A | Discharge status: Home / Self Care | Payer: Medicare Other | Source: Ambulatory Visit | Attending: Hematology | Admitting: Hematology

## 2017-07-31 ENCOUNTER — Encounter (HOSPITAL_COMMUNITY): Payer: Self-pay

## 2017-07-31 DIAGNOSIS — I2510 Atherosclerotic heart disease of native coronary artery without angina pectoris: Secondary | ICD-10-CM | POA: Insufficient documentation

## 2017-07-31 DIAGNOSIS — Z7982 Long term (current) use of aspirin: Secondary | ICD-10-CM | POA: Diagnosis not present

## 2017-07-31 DIAGNOSIS — C801 Malignant (primary) neoplasm, unspecified: Secondary | ICD-10-CM | POA: Diagnosis not present

## 2017-07-31 DIAGNOSIS — D539 Nutritional anemia, unspecified: Secondary | ICD-10-CM | POA: Diagnosis not present

## 2017-07-31 DIAGNOSIS — Z955 Presence of coronary angioplasty implant and graft: Secondary | ICD-10-CM | POA: Insufficient documentation

## 2017-07-31 DIAGNOSIS — D696 Thrombocytopenia, unspecified: Secondary | ICD-10-CM | POA: Diagnosis not present

## 2017-07-31 DIAGNOSIS — Z79899 Other long term (current) drug therapy: Secondary | ICD-10-CM | POA: Diagnosis not present

## 2017-07-31 DIAGNOSIS — Z87891 Personal history of nicotine dependence: Secondary | ICD-10-CM | POA: Diagnosis not present

## 2017-07-31 DIAGNOSIS — N401 Enlarged prostate with lower urinary tract symptoms: Secondary | ICD-10-CM | POA: Insufficient documentation

## 2017-07-31 DIAGNOSIS — Z8673 Personal history of transient ischemic attack (TIA), and cerebral infarction without residual deficits: Secondary | ICD-10-CM | POA: Diagnosis not present

## 2017-07-31 DIAGNOSIS — I10 Essential (primary) hypertension: Secondary | ICD-10-CM | POA: Diagnosis not present

## 2017-07-31 DIAGNOSIS — R351 Nocturia: Secondary | ICD-10-CM | POA: Diagnosis not present

## 2017-07-31 DIAGNOSIS — C7952 Secondary malignant neoplasm of bone marrow: Secondary | ICD-10-CM | POA: Insufficient documentation

## 2017-07-31 DIAGNOSIS — D649 Anemia, unspecified: Secondary | ICD-10-CM | POA: Diagnosis not present

## 2017-07-31 DIAGNOSIS — I252 Old myocardial infarction: Secondary | ICD-10-CM | POA: Insufficient documentation

## 2017-07-31 LAB — BASIC METABOLIC PANEL
Anion gap: 8 (ref 5–15)
BUN: 28 mg/dL — ABNORMAL HIGH (ref 6–20)
CHLORIDE: 105 mmol/L (ref 101–111)
CO2: 24 mmol/L (ref 22–32)
Calcium: 8.8 mg/dL — ABNORMAL LOW (ref 8.9–10.3)
Creatinine, Ser: 1.99 mg/dL — ABNORMAL HIGH (ref 0.61–1.24)
GFR calc non Af Amer: 29 mL/min — ABNORMAL LOW (ref >60)
GFR, EST AFRICAN AMERICAN: 34 mL/min — AB (ref >60)
GLUCOSE: 115 mg/dL — AB (ref 65–99)
Potassium: 4.9 mmol/L (ref 3.5–5.1)
Sodium: 137 mmol/L (ref 135–145)

## 2017-07-31 LAB — KAPPA/LAMBDA LIGHT CHAINS
KAPPA FREE LGHT CHN: 41.6 mg/L — AB (ref 3.3–19.4)
Kappa, lambda light chain ratio: 1.41 (ref 0.26–1.65)
Lambda free light chains: 29.6 mg/L — ABNORMAL HIGH (ref 5.7–26.3)

## 2017-07-31 LAB — CBC WITH DIFFERENTIAL/PLATELET
BASOS PCT: 0 %
Basophils Absolute: 0 10*3/uL (ref 0.0–0.1)
EOS PCT: 1 %
Eosinophils Absolute: 0.1 10*3/uL (ref 0.0–0.7)
HEMATOCRIT: 30.4 % — AB (ref 39.0–52.0)
Hemoglobin: 9.6 g/dL — ABNORMAL LOW (ref 13.0–17.0)
Lymphocytes Relative: 28 %
Lymphs Abs: 1.7 10*3/uL (ref 0.7–4.0)
MCH: 29.8 pg (ref 26.0–34.0)
MCHC: 31.6 g/dL (ref 30.0–36.0)
MCV: 94.4 fL (ref 78.0–100.0)
MONO ABS: 0.5 10*3/uL (ref 0.1–1.0)
Monocytes Relative: 9 %
NEUTROS ABS: 3.8 10*3/uL (ref 1.7–7.7)
NEUTROS PCT: 62 %
Platelets: 233 10*3/uL (ref 150–400)
RBC: 3.22 MIL/uL — ABNORMAL LOW (ref 4.22–5.81)
RDW: 24.6 % — AB (ref 11.5–15.5)
WBC: 6.1 10*3/uL (ref 4.0–10.5)

## 2017-07-31 LAB — PROSTATE-SPECIFIC AG, SERUM (LABCORP): Prostate Specific Ag, Serum: 1186 ng/mL — ABNORMAL HIGH (ref 0.0–4.0)

## 2017-07-31 MED ORDER — MIDAZOLAM HCL 2 MG/2ML IJ SOLN
INTRAMUSCULAR | Status: AC
  Filled 2017-07-31: qty 4

## 2017-07-31 MED ORDER — SODIUM CHLORIDE 0.9 % IV SOLN
INTRAVENOUS | Status: DC
  Administered 2017-07-31: 09:00:00 via INTRAVENOUS

## 2017-07-31 MED ORDER — FENTANYL CITRATE (PF) 100 MCG/2ML IJ SOLN
INTRAMUSCULAR | Status: AC | PRN
  Administered 2017-07-31 (×2): 25 ug via INTRAVENOUS
  Administered 2017-07-31: 50 ug via INTRAVENOUS

## 2017-07-31 MED ORDER — MIDAZOLAM HCL 2 MG/2ML IJ SOLN
INTRAMUSCULAR | Status: AC | PRN
  Administered 2017-07-31: 1 mg via INTRAVENOUS
  Administered 2017-07-31 (×2): 0.5 mg via INTRAVENOUS

## 2017-07-31 MED ORDER — LIDOCAINE-EPINEPHRINE (PF) 2 %-1:200000 IJ SOLN
INTRAMUSCULAR | Status: AC | PRN
  Administered 2017-07-31: 10 mL via INTRADERMAL

## 2017-07-31 MED ORDER — FENTANYL CITRATE (PF) 100 MCG/2ML IJ SOLN
INTRAMUSCULAR | Status: AC
  Filled 2017-07-31: qty 4

## 2017-07-31 NOTE — Procedures (Signed)
Pre-procedure Diagnosis: Anemia and thrombocytopenia Post-procedure Diagnosis: Same  Technically successful CT guided bone marrow aspiration and biopsy of left iliac crest.   Complications: None Immediate  EBL: None  SignedSandi Mariscal Pager: [phone] 07/31/2017, 11:24 AM

## 2017-07-31 NOTE — H&P (Signed)
Chief Complaint: Patient was seen in consultation today for bone marrow biopsy at the request of Brunetta Genera  Referring Physician(s): Brunetta Genera  Supervising Physician: Sandi Mariscal  Patient Status: [facility] - Out-pt  History of Present Illness: Tony [name] is a 84 y.o. male being worked up for anemia and thrombocytopenia He is referred for bone marrow biopsy PMHx, meds, labs reviewed. Has been NPO this am. Feels well. Family at bedside  Past Medical History:  Diagnosis Date  . BPH associated with nocturia    flomax 0.4--> 0.8 mg trial. nocturia if has coffee. some incontinence  . CAD (coronary artery disease)    LAD Stent 2012. 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   . Gout    no rx. apparently 1x in past  . History of stroke    no aspirin before stroke. no deficits. slurred words at time of stroke  . Hyperlipidemia   . Hypertension     Past Surgical History:  Procedure Laterality Date  . CARDIAC SURGERY     Stint  . CORONARY STENT PLACEMENT    . left arm fracture s/p surgery    . right leg fracture s.p surgery- screws like arm      Allergies: Patient has no known allergies.  Medications: Prior to Admission medications   Medication Sig Start Date End Date Taking? Authorizing Provider  amLODipine (NORVASC) 5 MG tablet Take 1 tablet (5 mg total) by mouth daily. Patient taking differently: Take 5 mg by mouth at bedtime.  01/23/17  Yes Marin Olp, MD  aspirin EC 81 MG tablet Take 1 tablet (81 mg total) by mouth daily. Patient taking differently: Take 81 mg by mouth at bedtime.  01/29/17  Yes [name], MD  isosorbide-hydrALAZINE (BIDIL) 20-37.5 MG tablet Take 1 tablet by mouth 3 (three) times daily. Patient taking differently: Take 1 tablet by mouth 2 (two) times daily.  11/07/16  Yes Marin Olp, MD  metoprolol succinate (TOPROL-XL) 25 MG 24 hr tablet Take 1 tablet (25 mg total) by mouth daily. Patient  taking differently: Take 25 mg by mouth at bedtime.  01/23/17  Yes Marin Olp, MD  tamsulosin (FLOMAX) 0.4 MG CAPS capsule TAKE 1 CAPSULE(0.4 MG) BY MOUTH DAILY Patient taking differently: TAKE 1 CAPSULE(0.4 MG) BY MOUTH in the evening 06/08/17  Yes Marin Olp, MD  vitamin B-12 (CYANOCOBALAMIN) 1000 MCG tablet Take 1 tablet (1,000 mcg total) by mouth daily. 07/22/17  Yes Mikhail, Velta Addison, DO  nitroGLYCERIN (NITROSTAT) 0.4 MG SL tablet Place 1 tablet (0.4 mg total) under the tongue every 5 (five) minutes as needed for chest pain (3 maximum before seeking care). 11/17/16   Marin Olp, MD     Family History  Problem Relation Age of Onset  . Breast cancer Mother   . Heart disease Father   . Heart disease Brother        x2  . Prostate cancer Brother     Social History   Socioeconomic History  . Marital status: Married    Spouse name: Not on file  . Number of children: Not on file  . Years of education: Not on file  . Highest education level: Not on file  Occupational History  . Not on file  Social Needs  . Financial resource strain: Not on file  . Food insecurity:    Worry: Not on file    Inability: Not on file  . Transportation needs:  Medical: Not on file    Non-medical: Not on file  Tobacco Use  . Smoking status: Former Smoker    Packs/day: 0.50    Years: 1.00    Pack years: 0.50    Types: Cigarettes    Last attempt to quit: 05/01/1952    Years since quitting: 65.2  . Smokeless tobacco: Never Used  Substance and Sexual Activity  . Alcohol use: No  . Drug use: No  . Sexual activity: Not on file  Lifestyle  . Physical activity:    Days per week: Not on file    Minutes per session: Not on file  . Stress: Not on file  Relationships  . Social connections:    Talks on phone: Not on file    Gets together: Not on file    Attends religious service: Not on file    Active member of club or organization: Not on file    Attends meetings of clubs or  organizations: Not on file    Relationship status: Not on file  Other Topics Concern  . Not on file  Social History Narrative   Married- lives separate from wife. 2 children. Daughter passed from heart attack.       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  All other systems are negative.  Review of Systems  Vital Signs: BP (!) 144/73 (BP Location: Right Arm)   Pulse 85   Temp 98.1 F (36.7 C) (Oral)   Resp 18   SpO2 100%   Physical Exam  Constitutional: He is oriented to person, place, and time. He appears well-developed. No distress.  HENT:  Head: Normocephalic.  Mouth/Throat: Oropharynx is clear and moist.  Neck: Normal range of motion. No JVD present. No tracheal deviation present.  Cardiovascular: Normal rate, regular rhythm and normal heart sounds.  Pulmonary/Chest: Effort normal and breath sounds normal. No respiratory distress.  Neurological: He is alert and oriented to person, place, and time.  Skin: Skin is warm and dry.  Psychiatric: He has a normal mood and affect.    Imaging: Dg Lumbar Spine 2-3 Views  Result Date: 07/06/2017 CLINICAL DATA:  Bilateral low back pain x 2 weeks. No radiculopathy. No injury. EXAM: LUMBAR SPINE - 2-3 VIEW COMPARISON:  None. FINDINGS: There are transitional lumbosacral and thoracolumbar vertebra with 4 non rib-bearing lumbar type vertebral bodies. No fracture. There is generalized increased density throughout the visualized spine, but no osteolytic lesions. Grade 1 anterolisthesis of L4 on L5 where there is moderate loss of disc height. No other spondylolisthesis. Remaining disc spaces are well preserved. Soft tissues are unremarkable. IMPRESSION: 1. No fracture or acute finding. 2. Disc and facet degenerative changes at L4-L5 with moderate loss of disc height and grade 1 anterolisthesis. 3.  Generalized increased density of the visualized spine for this patient's age. This is nonspecific. No discrete bone lesion. Electronically Signed   By: Lajean Manes M.D.   On: 07/06/2017 15:24    Labs:  CBC: Recent Labs    07/21/17 0536 07/22/17 0525 07/26/17 1117 07/30/17 1506 07/31/17 0917  WBC 5.4 5.0  --  5.9 6.1  HGB 7.7* 8.7* 8.9*  --  9.6*  HCT 24.0* 27.2*  --  30.8* 30.4*  PLT 133* 134*  --  227 233    COAGS: Recent Labs  07/20/17 1600  INR 1.16    BMP: Recent Labs    07/21/17 0536 07/22/17 0525 07/30/17 1506 07/31/17 0917  NA 134* 139 139 137  K 4.2 4.2 4.4 4.9  CL 103 108 107 105  CO2 _0 GLUCOSE 101* 95 106 115*  BUN 16 15 28* 28*  CALCIUM 7.9* 8.0* 9.2 8.8*  CREATININE 1.85* 2.03* 1.95* 1.99*  GFRNONAA 32* 28* 30* 29*  GFRAA 37* 33* 34* 34*    LIVER FUNCTION TESTS: Recent Labs    07/10/17 1302 07/20/17 1600 07/21/17 0536 07/30/17 1506  BILITOT 0.8 1.2 1.1 0.6  AST _1 ALT _2 ALKPHOS 747* 488* 433* 718*  PROT 6.5 6.6 5.7* 7.0  ALBUMIN 3.9 3.3* 2.7* 3.2*    TUMOR MARKERS: No results for input(s): AFPTM, CEA, CA199, CHROMGRNA in the last 8760 hours.  Assessment and Plan: Anemia and thrombocytopenia For CT guided bone marrow biopsy Labs ok. Risks and benefits discussed with the patient including, but not limited to bleeding, infection, damage to adjacent structures or low yield requiring additional tests.  All of the patient's questions were answered, patient is agreeable to proceed. Consent signed and in chart.    Thank you for this interesting consult.  I greatly enjoyed meeting Tony [name] and look forward to participating in their care.  A copy of this report was sent to the requesting provider on this date.  Electronically Signed: Ascencion Dike, PA-C 07/31/2017, 10:18 AM   I spent a total of 20 minutes in face to face in clinical consultation, greater than 50% of which was  counseling/coordinating care for bone marrow biopsy.

## 2017-07-31 NOTE — Discharge Instructions (Signed)

## 2017-08-01 LAB — MULTIPLE MYELOMA PANEL, SERUM
ALBUMIN SERPL ELPH-MCNC: 3.3 g/dL (ref 2.9–4.4)
ALBUMIN/GLOB SERPL: 1.1 (ref 0.7–1.7)
ALPHA 1: 0.3 g/dL (ref 0.0–0.4)
Alpha2 Glob SerPl Elph-Mcnc: 1 g/dL (ref 0.4–1.0)
B-Globulin SerPl Elph-Mcnc: 0.9 g/dL (ref 0.7–1.3)
GAMMA GLOB SERPL ELPH-MCNC: 0.9 g/dL (ref 0.4–1.8)
GLOBULIN, TOTAL: 3.1 g/dL (ref 2.2–3.9)
IGA: 140 mg/dL (ref 61–437)
IGM (IMMUNOGLOBULIN M), SRM: 34 mg/dL (ref 15–143)
IgG (Immunoglobin G), Serum: 1060 mg/dL (ref 700–1600)
Total Protein ELP: 6.4 g/dL (ref 6.0–8.5)

## 2017-08-06 ENCOUNTER — Ambulatory Visit (HOSPITAL_COMMUNITY)
Admission: RE | Admit: 2017-08-06 | Discharge: 2017-08-06 | Disposition: A | Discharge status: Home / Self Care | Payer: Medicare Other | Source: Ambulatory Visit | Attending: Hematology | Admitting: Hematology

## 2017-08-06 ENCOUNTER — Encounter (HOSPITAL_COMMUNITY): Payer: Self-pay

## 2017-08-06 ENCOUNTER — Encounter (HOSPITAL_COMMUNITY): Payer: Self-pay | Admitting: Hematology

## 2017-08-06 DIAGNOSIS — N134 Hydroureter: Secondary | ICD-10-CM | POA: Diagnosis not present

## 2017-08-06 DIAGNOSIS — C7951 Secondary malignant neoplasm of bone: Secondary | ICD-10-CM | POA: Insufficient documentation

## 2017-08-06 DIAGNOSIS — R634 Abnormal weight loss: Secondary | ICD-10-CM | POA: Diagnosis not present

## 2017-08-06 DIAGNOSIS — I700 Atherosclerosis of aorta: Secondary | ICD-10-CM | POA: Diagnosis not present

## 2017-08-06 DIAGNOSIS — N400 Enlarged prostate without lower urinary tract symptoms: Secondary | ICD-10-CM | POA: Insufficient documentation

## 2017-08-06 DIAGNOSIS — R945 Abnormal results of liver function studies: Secondary | ICD-10-CM | POA: Insufficient documentation

## 2017-08-06 DIAGNOSIS — R590 Localized enlarged lymph nodes: Secondary | ICD-10-CM | POA: Insufficient documentation

## 2017-08-06 DIAGNOSIS — R7989 Other specified abnormal findings of blood chemistry: Secondary | ICD-10-CM | POA: Diagnosis not present

## 2017-08-07 ENCOUNTER — Telehealth: Payer: Self-pay

## 2017-08-07 ENCOUNTER — Other Ambulatory Visit: Payer: Self-pay | Admitting: Hematology

## 2017-08-07 DIAGNOSIS — C7951 Secondary malignant neoplasm of bone: Secondary | ICD-10-CM | POA: Insufficient documentation

## 2017-08-07 DIAGNOSIS — C61 Malignant neoplasm of prostate: Secondary | ICD-10-CM | POA: Insufficient documentation

## 2017-08-07 LAB — CHROMOSOME ANALYSIS, BONE MARROW

## 2017-08-07 NOTE — Telephone Encounter (Signed)
Per Altamese Dilling, no pre-authorization required for lupron or xgeva per insurance.

## 2017-08-07 NOTE — Telephone Encounter (Signed)
In-basket message sent to [facility] requesting update on authorization for lupron and xgeva per Dr. [name].

## 2017-08-08 ENCOUNTER — Ambulatory Visit (HOSPITAL_COMMUNITY)
Admission: RE | Admit: 2017-08-08 | Discharge: 2017-08-08 | Disposition: A | Discharge status: Home / Self Care | Payer: Medicare Other | Source: Ambulatory Visit | Attending: Hematology | Admitting: Hematology

## 2017-08-08 DIAGNOSIS — C7951 Secondary malignant neoplasm of bone: Secondary | ICD-10-CM | POA: Insufficient documentation

## 2017-08-08 DIAGNOSIS — R591 Generalized enlarged lymph nodes: Secondary | ICD-10-CM | POA: Insufficient documentation

## 2017-08-08 DIAGNOSIS — C61 Malignant neoplasm of prostate: Secondary | ICD-10-CM | POA: Diagnosis not present

## 2017-08-08 DIAGNOSIS — I700 Atherosclerosis of aorta: Secondary | ICD-10-CM | POA: Insufficient documentation

## 2017-08-08 NOTE — Progress Notes (Signed)
HEMATOLOGY/ONCOLOGY CLINIC NOTE  Date of Service: 08/09/17  Patient Care Team: [name], MD as PCP - General (Family Medicine)  CHIEF COMPLAINTS/PURPOSE OF CONSULTATION:  -newly diagnosed metastatic prostate cancer -Myelopthisic anemia  HISTORY OF PRESENTING ILLNESS:   Tony [name] is a wonderful 84 y.o. male who has been referred to [place] by Dr Dimas Chyle for evaluation and management of anemia. He is accompanied today by his daughter. The pt reports that he is doing well overall.   The pt reports a new onset of fatigue that began in January 2019. His daughter notes being able to tell a difference in his energy levels as early as November 2018. He notes that some cramping pain in his thighs. He notes that his recent 07/23/17 blood transfusion has led to a "terrific, immediate change" in his thigh pain. However, his thigh pain has returned in the last couple days.  He notes that he takes 1042mg Vitamin B12 daily.   He notes that for 6-7 years he took iron pills after his 2012 heart attack and stroke. He notes that he took a statin for 6-7 months and was taken off of it recently. He notes no unresolved symptoms from his stroke.   The pt notes that over the last 4-5 months his appetite has diminished and he has subsequently lost about 20 lbs in that time.  He notes that he has historically not preferred to drink water as such, but hydrates with juice, coffee, and other drinks.  He notes that he believes that he is able to take care of himself adequately at home. He also notes that his cane is sufficient for helping him get around.   Most recent lab results (07/22/17) of CBC  is as follows: all values are WNL except for RBC at 2.94, Hgb at 8.7, HCT at 27.2, RDW at 26.5, Platelets at 134k. CBC from 10/30/16 revealed all values WNL.  CMP 07/21/17 revealed all values WNL except for Sodium at 134, Glucose at 101, Creatinine at 1.85, Calcium at 7.9, Total Protein at 5.7, Albumin at 2.7,  Alk Phos at 433.  LDH 07/20/17 elevated at 256. Haptoglobin 07/20/17 elevated at 357.  Vitamin B12 07/06/17 is WNL at 229.   On review of systems, pt reports decreased appetite, losing 20 lbs over 4-5 months, bilateral thigh pain, fatigue, mild leg swelling, and denies fevers, chills, night sweats, bone pains, nausea, abdominal pains, bleeding, blood in the urine, blood in the stools, black stools, changes in bowel habits, light headednss, dizziness, abdominal pains, noticing any new lumps or bumps, testicular pain or swelling, and any other symptoms.   On PMHx the pt reports heart attack and stroke in 2012. On Social Hx the pt denies much ETOH consumption.   Interval History:  Tony [name] today regarding his newly diagnosed metastatic prostate cancer and myelopthisic anemia. The patient's last visit with [place] on 07/30/17. He is accompanied today by his wife and daughter. The pt reports that he is doing well overall.   The pt reports that he has noticed a decrease in flow of his urination. He notes that he has had bladder incontinence for several years. He has sensation of his bladder becoming full, and notes some urgency and frequency to needing to use the bathroom.   He notes that he experiences pain in his upper legs while walking; for the last 2-3 months. He notes some pain in his waist as well. He denies using any pain  mx.   The pt denies any concern for imminent dental procedures. He notes that he uses dentures and has one tooth which is not problematic.   Of note since the patient's last visit, pt has had CT A/P completed on 08/06/17 with results revealing 1. Exam positive for left  retroperitoneal, bilateral pelvis and right inguinal adenopathy. Findings may be the sequelae of metastatic disease or lymphoproliferative disorder. The right inguinal lymph node should be easily amendable to image guided core biopsy. 2. Diffuse sclerotic bone metastasis. 3. Marked enlargement of the  prostate gland which has a volume of approximately 160 cc. 4.  Aortic Atherosclerosis (ICD10-I70.0).  On 07/31/17 the pt had Bone Marrow Biopsy, left iliac crest revealing METASTATIC CARCINOMA likely prostatic carcinoma. PERIPHERAL BLOOD: - NORMOCYTIC-NORMOCHROMIC ANEMIA. - LEUKOERYTHROBLASTIC REACTION.   On 08/08/17 the pt also had a CT Chest which revealed 1. No CT findings for pulmonary metastatic disease. 2. Enlarged left supraclavicular node worrisome for metastatic disease. 3. Enlarged retrocrural lymph node noted. 4. Diffuse sclerotic osseous metastatic disease   The pt is scheduled for a Whole Body Bone Scan on 08/13/17.   Lab results (07/31/17) of CBC, BMP, and Reticulocytes is as follows: all values are WNL except for RBC at 3.22, Hgb at 9.6, HCT at 30.4, RDW at 24.6, Glucose at 115, BUN at 28, Creatinine at 1.99, Calcium at 8.8.   On review of systems, pt reports bladder incontinence, upper leg pain, hip pain, decreased appetite, bladder urgency and frequency,  and denies bowel incontinence, and any other symptoms.    MEDICAL HISTORY:  Past Medical History:  Diagnosis Date  . BPH associated with nocturia    flomax 0.4--> 0.8 mg trial. nocturia if has coffee. some incontinence  . CAD (coronary artery disease)    LAD Stent 2012. 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   . Gout    no rx. apparently 1x in past  . History of stroke    no aspirin before stroke. no deficits. slurred words at time of stroke  . Hyperlipidemia   . Hypertension     SURGICAL HISTORY: Past Surgical History:  Procedure Laterality Date  . CARDIAC SURGERY     Stint  . CORONARY STENT PLACEMENT    . left arm fracture s/p surgery    . right leg fracture s.p surgery- screws like arm      SOCIAL HISTORY: Social History   Socioeconomic History  . Marital status: Married    Spouse name: Not on file  . Number of children: Not on file  . Years of education: Not on file  . Highest  education level: Not on file  Occupational History  . Not on file  Social Needs  . Financial resource strain: Not on file  . Food insecurity:    Worry: Not on file    Inability: Not on file  . Transportation needs:    Medical: Not on file    Non-medical: Not on file  Tobacco Use  . Smoking status: Former Smoker    Packs/day: 0.50    Years: 1.00    Pack years: 0.50    Types: Cigarettes    Last attempt to quit: 05/01/1952    Years since quitting: 65.3  . Smokeless tobacco: Never Used  Substance and Sexual Activity  . Alcohol use: No  . Drug use: No  . Sexual activity: Not on file  Lifestyle  . Physical activity:    Days per week: Not  on file    Minutes per session: Not on file  . Stress: Not on file  Relationships  . Social connections:    Talks on phone: Not on file    Gets together: Not on file    Attends religious service: Not on file    Active member of club or organization: Not on file    Attends meetings of clubs or organizations: Not on file    Relationship status: Not on file  . Intimate partner violence:    Fear of current or ex partner: Not on file    Emotionally abused: Not on file    Physically abused: Not on file    Forced sexual activity: Not on file  Other Topics Concern  . Not on file  Social History Narrative   Married- lives separate from wife. 2 children. Daughter passed from heart attack.       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 Breast cancer Mother   . Heart disease Father   . Heart disease Brother        x2  . Prostate cancer Brother     ALLERGIES:  has No Known Allergies.  MEDICATIONS:  Current Outpatient Medications  Medication Sig Dispense Refill  . amLODipine (NORVASC) 5 MG tablet Take 1 tablet (5 mg total) by mouth daily. (Patient taking differently: Take 5 mg by mouth at bedtime. ) 90 tablet 3  . aspirin EC 81 MG tablet  Take 1 tablet (81 mg total) by mouth daily. (Patient taking differently: Take 81 mg by mouth at bedtime. ) 90 tablet 3  . isosorbide-hydrALAZINE (BIDIL) 20-37.5 MG tablet Take 1 tablet by mouth 3 (three) times daily. (Patient taking differently: Take 1 tablet by mouth 2 (two) times daily. ) 270 tablet 1  . metoprolol succinate (TOPROL-XL) 25 MG 24 hr tablet Take 1 tablet (25 mg total) by mouth daily. (Patient taking differently: Take 25 mg by mouth at bedtime. ) 90 tablet 3  . nitroGLYCERIN (NITROSTAT) 0.4 MG SL tablet Place 1 tablet (0.4 mg total) under the tongue every 5 (five) minutes as needed for chest pain (3 maximum before seeking care). 30 tablet 0  . tamsulosin (FLOMAX) 0.4 MG CAPS capsule TAKE 1 CAPSULE(0.4 MG) BY MOUTH DAILY (Patient taking differently: TAKE 1 CAPSULE(0.4 MG) BY MOUTH in the evening) 90 capsule 1  . vitamin B-12 (CYANOCOBALAMIN) 1000 MCG tablet Take 1 tablet (1,000 mcg total) by mouth daily. 30 tablet 0   No current facility-administered medications for this visit.     REVIEW OF SYSTEMS:    10 Point review of Systems was done is negative except as noted above.   PHYSICAL EXAMINATION:  . Vitals:   08/09/17 1259  BP: (!) 125/59  Pulse: 98  Resp: 18  Temp: 98 F (36.7 C)  SpO2: 98%   Filed Weights   08/09/17 1259  Weight: 192 lb 4.8 oz (87.2 kg)   .Body mass index is 30.12 kg/m.  GENERAL:alert, in no acute distress and comfortable SKIN: no acute rashes, no significant lesions EYES: conjunctiva pallor noted, sclera anicteric OROPHARYNX: MMM, no exudates, no oropharyngeal erythema or ulceration NECK: supple, no JVD LYMPH:  no palpable lymphadenopathy in the cervical, axillary or inguinal regions LUNGS: clear to auscultation b/l with normal respiratory effort HEART: regular rate & rhythm ABDOMEN:  normoactive bowel sounds , non tender, not distended.  Extremity: 1+ pedal edema PSYCH: alert & oriented x 3 with fluent speech NEURO: no focal motor/sensory  deficits   LABORATORY DATA:  I have reviewed the data as listed  . CBC Latest Ref Rng & Units 07/31/2017 07/30/2017 07/26/2017  WBC 4.0 - 10.5 K/uL 6.1 5.9 -  Hemoglobin 13.0 - 17.0 g/dL 9.6(L) - 8.9(A)  Hematocrit 39.0 - 52.0 % 30.4(L) 30.8(L) -  Platelets 150 - 400 K/uL 233 227 -   . CBC    Component Value Date/Time   WBC 6.1 07/31/2017 0917   RBC 3.22 (L) 07/31/2017 0917   HGB 9.6 (L) 07/31/2017 0917   HCT 30.4 (L) 07/31/2017 0917   HCT 21.9 (L) 07/20/2017 2103   PLT 233 07/31/2017 0917   PLT 227 07/30/2017 1506   MCV 94.4 07/31/2017 0917   MCH 29.8 07/31/2017 0917   MCHC 31.6 07/31/2017 0917   RDW 24.6 (H) 07/31/2017 0917   LYMPHSABS 1.7 07/31/2017 0917   MONOABS 0.5 07/31/2017 0917   EOSABS 0.1 07/31/2017 0917   BASOSABS 0.0 07/31/2017 0917     . CMP Latest Ref Rng & Units 07/31/2017 07/30/2017 07/22/2017  Glucose 65 - 99 mg/dL 115(H) 106 95  BUN 6 - 20 mg/dL 28(H) 28(H) 15  Creatinine 0.61 - 1.24 mg/dL 1.99(H) 1.95(H) 2.03(H)  Sodium 135 - 145 mmol/L 137 139 139  Potassium 3.5 - 5.1 mmol/L 4.9 4.4 4.2  Chloride 101 - 111 mmol/L 105 107 108  CO2 22 - 32 mmol/L '24 25 23  '$ Calcium 8.9 - 10.3 mg/dL 8.8(L) 9.2 8.0(L)  Total Protein 6.4 - 8.3 g/dL - 7.0 -  Total Bilirubin 0.2 - 1.2 mg/dL - 0.6 -  Alkaline Phos 40 - 150 U/L - 718(H) -  AST 5 - 34 U/L - 19 -  ALT 0 - 55 U/L - 16 -   Component     Latest Ref Rng & Units 07/30/2017  IgG (Immunoglobin G), Serum     700 - 1,600 mg/dL 1,060  IgA     61 - 437 mg/dL 140  IgM (Immunoglobulin M), Srm     15 - 143 mg/dL 34  Total Protein ELP     6.0 - 8.5 g/dL 6.4  Albumin SerPl Elph-Mcnc     2.9 - 4.4 g/dL 3.3  Alpha 1     0.0 - 0.4 g/dL 0.3  Alpha2 Glob SerPl Elph-Mcnc     0.4 - 1.0 g/dL 1.0  B-Globulin SerPl Elph-Mcnc     0.7 - 1.3 g/dL 0.9  Gamma Glob SerPl Elph-Mcnc     0.4 - 1.8 g/dL 0.9  M Protein SerPl Elph-Mcnc     Not Observed g/dL Not Observed  Globulin, Total     2.2 - 3.9 g/dL 3.1  Albumin/Glob SerPl      0.7 - 1.7 1.1  IFE 1      Comment  Please Note (HCV):      Comment  Kappa free light chain     3.3 - 19.4 mg/L 41.6 (H)  Lamda free light chains     5.7 - 26.3 mg/L 29.6 (H)  Kappa, lamda light chain ratio     0.26 - 1.65 1.41  Retic Ct Pct     0.8 - 1.8 % 2.1 (H)  RBC.     4.20 - 5.82 MIL/uL 3.23 (L)  Retic Count, Absolute     34.8 - 93.9 K/uL 67.8  Sed Rate     0 - 16 mm/hr 98 (  H)  LDH     125 - 245 U/L 307 (H)  Prostate Specific Ag, Serum     0.0 - 4.0 ng/mL 1,186.0 (H)   07/31/17 BM Bx:   RADIOGRAPHIC STUDIES: I have personally reviewed the radiological images as listed and agreed with the findings in the report. Ct Abdomen Pelvis Wo Contrast  Result Date: 08/06/2017 CLINICAL DATA:  Abnormal liver function test and weight loss. EXAM: CT ABDOMEN AND PELVIS WITHOUT CONTRAST TECHNIQUE: Multidetector CT imaging of the abdomen and pelvis was performed following the standard protocol without IV contrast. COMPARISON:  None FINDINGS: Lower chest: No acute abnormality. Hepatobiliary: No focal liver abnormality is seen. No gallstones, gallbladder wall thickening, or biliary dilatation. Pancreas: Unremarkable. No pancreatic ductal dilatation or surrounding inflammatory changes. Spleen: Normal in size without focal abnormality. Adrenals/Urinary Tract: The adrenal glands appear normal. Normal appearance of the right kidney. Normal appearance of the right kidney. There is left-sided pelvocaliectasis and mild hydroureter to the level of the urinary bladder. No kidney stones identified. Stomach/Bowel: Stomach is normal. The small bowel loops have a normal caliber. The appendix is visualized and appears normal. No pathologic dilatation of the colon. Extensive sigmoid diverticulosis identified without acute inflammation. Vascular/Lymphatic: Aortic atherosclerosis noted. No aneurysm. Extensive left retroperitoneal adenopathy is identified. Nodal mass at the level of the left renal hilum measures 4.2 x  3.1 cm, image 31/2. Bilateral pelvic adenopathy identified. Index node in the left pelvic sidewall measures 2.1 cm, image 56/2. Right pelvic sidewall node measures 1.9 cm, image 56/2. In the right inguinal region there is a large node measuring 3 cm, image 69/2. Reproductive: Prostate gland appears enlarged measuring 7.7 by 6.2 by 6.5 cm (volume = 160 cm^3). Other: No ascites.  No peritoneal nodularity. Musculoskeletal: Extensive sclerotic bone metastasis involves the lumbar spine, bony pelvis and proximal left femur. IMPRESSION: 1. Exam positive for left retroperitoneal, bilateral pelvis and right inguinal adenopathy. Findings may be the sequelae of metastatic disease or lymphoproliferative disorder. The right inguinal lymph node should be easily amendable to image guided core biopsy. 2. Diffuse sclerotic bone metastasis. 3. Marked enlargement of the prostate gland which has a volume of approximately 160 cc. 4.  Aortic Atherosclerosis (ICD10-I70.0). Electronically Signed   By: Kerby Moors M.D.   On: 08/06/2017 14:47   Ct Chest Wo Contrast  Result Date: 08/08/2017 CLINICAL DATA:  Newly diagnosed prostate cancer.  Metastatic workup. EXAM: CT CHEST WITHOUT CONTRAST TECHNIQUE: Multidetector CT imaging of the chest was performed following the standard protocol without IV contrast. COMPARISON:  CT abdomen/pelvis 08/06/2017 FINDINGS: Cardiovascular: Moderate cardiac enlargement. No pericardial effusion. Scattered aortic calcifications but no focal aneurysm. Coronary artery calcifications are noted. Mildly enlarged pulmonary artery suggesting pulmonary hypertension. Mediastinum/Nodes: Small scattered mediastinal and hilar lymph nodes but no mass or overt adenopathy. The esophagus is grossly normal. Calcified right infrahilar lymph nodes are noted along with a calcified granuloma at the right lung base. Lungs/Pleura: No worrisome pulmonary lesions to suggest pulmonary metastatic disease. A calcified granuloma is  noted at the right lung base. A few tiny scattered subpleural nodules are likely lymph nodes. No acute pulmonary findings. No pleural effusion. Upper Abdomen: 11 mm retrocrural lymph node is noted on the right side. No other upper abdominal lymphadenopathy. Musculoskeletal: Diffuse sclerotic metastatic bone disease involving the spine, ribs and sternum. No obvious pathologic fracture or spinal canal compromise. Evidence of remote trauma involving the right clavicle. 13.5 mm left supraclavicular lymph node worrisome for metastatic adenopathy. No axillary adenopathy. IMPRESSION:  1. No CT findings for pulmonary metastatic disease. 2. Enlarged left supraclavicular node worrisome for metastatic disease. 3. Enlarged retrocrural lymph node noted. 4. Diffuse sclerotic osseous metastatic disease. Aortic Atherosclerosis (ICD10-I70.0). Electronically Signed   By: Marijo Sanes M.D.   On: 08/08/2017 16:44   Ct Biopsy  Result Date: 07/31/2017 INDICATION: History of severe macrocytic anemia and thrombocytopenia, evaluate for MDS or other infiltrative process. EXAM: CT-GUIDED BONE MARROW BIOPSY AND ASPIRATION MEDICATIONS: None ANESTHESIA/SEDATION: Fentanyl 100 mcg IV; Versed 2 mg IV Sedation Time: 15 Minutes; The patient was continuously monitored during the procedure by the interventional radiology nurse under my direct supervision. COMPLICATIONS: None immediate. PROCEDURE: Informed consent was obtained from the patient following an explanation of the procedure, risks, benefits and alternatives. The patient understands, agrees and consents for the procedure. All questions were addressed. A time out was performed prior to the initiation of the procedure. The patient was positioned prone and non-contrast localization CT was performed of the pelvis to demonstrate the iliac marrow spaces. The operative site was prepped and draped in the usual sterile fashion. Under sterile conditions and local anesthesia, a 22 gauge spinal needle  was utilized for procedural planning. Next, an 11 gauge coaxial bone biopsy needle was advanced into the left iliac marrow space. Needle position was confirmed with CT imaging. Initially, bone marrow aspiration was performed. Next, a bone marrow biopsy was obtained with the 11 gauge outer bone marrow device. The 11 gauge coaxial bone biopsy needle was re-advanced into a slightly different location within the left iliac marrow space, positioning was confirmed and an additional bone marrow biopsy was obtained. Samples were prepared with the cytotechnologist and deemed adequate. The needle was removed intact. Hemostasis was obtained with compression and a dressing was placed. The patient tolerated the procedure well without immediate post procedural complication. FINDINGS: Multiple sclerotic lesions are seen throughout the imaged pelvis, sacrum and lower lumbar spine. Index lesion within the left ilium measures approximately 1.5 x 1.2 cm (image 19, series 2). IMPRESSION: 1. Successful CT guided left iliac bone marrow aspiration and core biopsy. 2. CT imaging demonstrates multiple sclerotic lesions throughout the imaged pelvis, sacrum and imaged lower lumbar spine. Electronically Signed   By: Sandi Mariscal M.D.   On: 07/31/2017 12:28   Ct Bone Marrow Biopsy & Aspiration  Result Date: 07/31/2017 INDICATION: History of severe macrocytic anemia and thrombocytopenia, evaluate for MDS or other infiltrative process. EXAM: CT-GUIDED BONE MARROW BIOPSY AND ASPIRATION MEDICATIONS: None ANESTHESIA/SEDATION: Fentanyl 100 mcg IV; Versed 2 mg IV Sedation Time: 15 Minutes; The patient was continuously monitored during the procedure by the interventional radiology nurse under my direct supervision. COMPLICATIONS: None immediate. PROCEDURE: Informed consent was obtained from the patient following an explanation of the procedure, risks, benefits and alternatives. The patient understands, agrees and consents for the procedure. All  questions were addressed. A time out was performed prior to the initiation of the procedure. The patient was positioned prone and non-contrast localization CT was performed of the pelvis to demonstrate the iliac marrow spaces. The operative site was prepped and draped in the usual sterile fashion. Under sterile conditions and local anesthesia, a 22 gauge spinal needle was utilized for procedural planning. Next, an 11 gauge coaxial bone biopsy needle was advanced into the left iliac marrow space. Needle position was confirmed with CT imaging. Initially, bone marrow aspiration was performed. Next, a bone marrow biopsy was obtained with the 11 gauge outer bone marrow device. The 11 gauge coaxial bone biopsy needle  was re-advanced into a slightly different location within the left iliac marrow space, positioning was confirmed and an additional bone marrow biopsy was obtained. Samples were prepared with the cytotechnologist and deemed adequate. The needle was removed intact. Hemostasis was obtained with compression and a dressing was placed. The patient tolerated the procedure well without immediate post procedural complication. FINDINGS: Multiple sclerotic lesions are seen throughout the imaged pelvis, sacrum and lower lumbar spine. Index lesion within the left ilium measures approximately 1.5 x 1.2 cm (image 19, series 2). IMPRESSION: 1. Successful CT guided left iliac bone marrow aspiration and core biopsy. 2. CT imaging demonstrates multiple sclerotic lesions throughout the imaged pelvis, sacrum and imaged lower lumbar spine. Electronically Signed   By: Sandi Mariscal M.D.   On: 07/31/2017 12:28    ASSESSMENT & PLAN:   84 y.o. male with  1. Normocytic Anemia - due to primarily metastatic prostatic cancer (ACD + BM involvement with prostate cancer causing myelopthisic picture) -Discussed patient's most recent labs, Hgb at 8.7 on 07/22/17.  -In review of the patient's CBC records, his anemia has developed in the  last 6-7 months, unaccompanied by a drop in his EPO (07/20/17 elevated at 249.8);  LDH elevated  but haptoglobin at 357 on 07/20/17. -suggested against active hemolysis. Significantly increased sed rate. Myeloma panel - no overt evidence of plasma cell dyscrasia. He has CKD but elevated EPO suggests against this being the primary etiology of his anemia. Plan -he noted some urinary symptoms so a PSA was done which is not noted to be significantly elevated - concerning for prostate cancer -Bone marrow biopsy- showed prominent involvement of the BM with metastatic carcinoma consistent with prostatic primary - causing Myelopthisic picture. -treatment will need to be directed towards his metastatic prostate cancer - newly diagnosed.  2. Newly diagnosed metastatic prostate cancer With bone mets/BM Mets and LNadenopathy. Plan: -Discussed pt labwork today; Hg at 9.6 on 07/31/17 -Discussed most recent CT C/A/P -We will gather a bone scan to get a baseline- scheduled for 08/13/2017 -If back pain worsens or develops we will send out an MRI -Regarding the patient's anemia, we will set up transfusions prn -Discussed treatment options including hormone blocking treatment vs testes removal.  -Recommend monthly hormone blocking treatment Lupron, and Xgeva.  -discussed potential adverse effects of lupron -- will start with lupron 7.'5mg'$  Qmonthly for the first 1-2 months and if tolerated will switch to the q28monthy depot preparation. -Vitamin D and Calcium supplements ordered today. -Recommend eating well and staying well hydrated. -We will set up pt to meet with our nutritional therapist -Discussed the staging as Stage IV with BM involvement -Would recommend holding off on chemotherapy with concern for the patient's tolerance at 89yeas of age. -Will refer pt to PT pending the completion of the bone survey on 08/13/17 -Will order tramadol for leg and hip pain  -Provided supplemental information to the pt and  his family   3. Elevated alkaline phosphatase due to bone metastases from prostate cancer -CT BM bx -concerning for significant bone lesions in lower spine and pelvis PSA levels 1186 PLAN -We asked if he would like a referral for home health services and he denies wanting this at this time.     -Lupron q4weeks and XDelton Seeq4weeks starting from today -RTC with labs with NP for toxicity check in 2 weeks RTC with Dr KIrene Limboin 4 weeks with labs with 2nd dose of Xgeva and lupron   All of the patients questions were  answered with apparent satisfaction. The patient knows to call the clinic with any problems, questions or concerns.  . The total time spent in the appointment was 40 minutes and more than 50% was on counseling and direct patient cares and discussing the new diagnosis, natural history, prognosis and treatment options for prostate cancer.     [place] Lone MD [place] AAHIVMS [facility] Hematology/Oncology Physician [facility]  (Office):       [phone] (Work cell):  [phone] (Fax):           [phone]  08/09/2017 1:09 PM  This document serves as a record of services personally performed by Sullivan Lone, MD. It was created on his behalf by Baldwin Jamaica, a trained medical scribe. The creation of this record is based on the scribe's personal observations and the provider's statements to them.   .I have reviewed the above documentation for accuracy and completeness, and I agree with the above. Brunetta Genera MD MS

## 2017-08-09 ENCOUNTER — Inpatient Hospital Stay (HOSPITAL_BASED_OUTPATIENT_CLINIC_OR_DEPARTMENT_OTHER): Payer: Medicare Other | Admitting: Hematology

## 2017-08-09 ENCOUNTER — Telehealth: Payer: Self-pay | Admitting: Hematology

## 2017-08-09 ENCOUNTER — Encounter: Payer: Self-pay | Admitting: Hematology

## 2017-08-09 VITALS — BP 125/59 | HR 98 | Temp 98.0°F | Resp 18 | Ht 67.0 in | Wt 192.3 lb

## 2017-08-09 DIAGNOSIS — N189 Chronic kidney disease, unspecified: Secondary | ICD-10-CM | POA: Diagnosis not present

## 2017-08-09 DIAGNOSIS — N400 Enlarged prostate without lower urinary tract symptoms: Secondary | ICD-10-CM | POA: Diagnosis not present

## 2017-08-09 DIAGNOSIS — R740 Nonspecific elevation of levels of transaminase and lactic acid dehydrogenase [LDH]: Secondary | ICD-10-CM

## 2017-08-09 DIAGNOSIS — C7951 Secondary malignant neoplasm of bone: Secondary | ICD-10-CM

## 2017-08-09 DIAGNOSIS — K219 Gastro-esophageal reflux disease without esophagitis: Secondary | ICD-10-CM | POA: Diagnosis not present

## 2017-08-09 DIAGNOSIS — R748 Abnormal levels of other serum enzymes: Secondary | ICD-10-CM | POA: Diagnosis not present

## 2017-08-09 DIAGNOSIS — E785 Hyperlipidemia, unspecified: Secondary | ICD-10-CM | POA: Diagnosis not present

## 2017-08-09 DIAGNOSIS — D6182 Myelophthisis: Secondary | ICD-10-CM

## 2017-08-09 DIAGNOSIS — Z8673 Personal history of transient ischemic attack (TIA), and cerebral infarction without residual deficits: Secondary | ICD-10-CM | POA: Diagnosis not present

## 2017-08-09 DIAGNOSIS — I129 Hypertensive chronic kidney disease with stage 1 through stage 4 chronic kidney disease, or unspecified chronic kidney disease: Secondary | ICD-10-CM | POA: Diagnosis not present

## 2017-08-09 DIAGNOSIS — C61 Malignant neoplasm of prostate: Secondary | ICD-10-CM | POA: Diagnosis not present

## 2017-08-09 DIAGNOSIS — D539 Nutritional anemia, unspecified: Secondary | ICD-10-CM | POA: Diagnosis not present

## 2017-08-09 DIAGNOSIS — Z87891 Personal history of nicotine dependence: Secondary | ICD-10-CM | POA: Diagnosis not present

## 2017-08-09 DIAGNOSIS — Z79899 Other long term (current) drug therapy: Secondary | ICD-10-CM

## 2017-08-09 MED ORDER — ERGOCALCIFEROL 1.25 MG (50000 UT) PO CAPS: 50000.0000 [IU] | ORAL_CAPSULE | ORAL | 1 refills | Status: DC

## 2017-08-09 MED ORDER — TRAMADOL HCL 50 MG PO TABS: 50.0000 mg | ORAL_TABLET | Freq: Four times a day (QID) | ORAL | 0 refills | Status: DC | PRN

## 2017-08-09 MED ORDER — LEUPROLIDE ACETATE 7.5 MG IM KIT
7.5000 mg | PACK | Freq: Once | INTRAMUSCULAR | Status: AC
  Administered 2017-08-09: 7.5 mg via INTRAMUSCULAR
  Filled 2017-08-09: qty 7.5

## 2017-08-09 MED ORDER — CALCIUM CARBONATE ANTACID 500 MG PO CHEW
2.0000 | CHEWABLE_TABLET | Freq: Once | ORAL | Status: AC
  Administered 2017-08-09: 400 mg via ORAL
  Filled 2017-08-09: qty 2

## 2017-08-09 MED ORDER — DENOSUMAB 120 MG/1.7ML ~~LOC~~ SOLN
120.0000 mg | Freq: Once | SUBCUTANEOUS | Status: AC
  Administered 2017-08-09: 120 mg via SUBCUTANEOUS

## 2017-08-09 MED ORDER — LEUPROLIDE ACETATE (3 MONTH) 22.5 MG IM KIT: 22.5000 mg | PACK | Freq: Once | INTRAMUSCULAR | Status: DC

## 2017-08-09 MED ORDER — CALCIUM CARBONATE-VITAMIN D 500-200 MG-UNIT PO TABS: 1.0000 | ORAL_TABLET | Freq: Every day | ORAL | 2 refills | Status: AC

## 2017-08-09 MED ORDER — LEUPROLIDE ACETATE (3 MONTH) 22.5 MG IM KIT
PACK | INTRAMUSCULAR | Status: AC
  Filled 2017-08-09: qty 22.5

## 2017-08-09 MED ORDER — DENOSUMAB 120 MG/1.7ML ~~LOC~~ SOLN
SUBCUTANEOUS | Status: AC
  Filled 2017-08-09: qty 1.7

## 2017-08-09 NOTE — Patient Instructions (Addendum)
Thank you for choosing [facility] to provide your oncology and hematology care.  To afford each patient quality time with our providers, please arrive 30 minutes before your scheduled appointment time.  If you arrive late for your appointment, you may be asked to reschedule.  We strive to give you quality time with our providers, and arriving late affects you and other patients whose appointments are after yours.   If you are a no show for multiple scheduled visits, you may be dismissed from the clinic at the providers discretion.    Again, thank you for choosing [facility], our hope is that these requests will decrease the amount of time that you wait before being seen by our physicians.  ______________________________________________________________________  Should you have questions after your visit to the [facility], please contact our office at [phone] between the hours of 8:30 and 4:30 p.m.    Voicemails left after 4:30p.m will not be returned until the following business day.    For prescription refill requests, please have your pharmacy contact us directly.  Please also try to allow 48 hours for prescription requests.    Please contact the scheduling department for questions regarding scheduling.  For scheduling of procedures such as PET scans, CT scans, MRI, Ultrasound, etc please contact central scheduling at [phone].    Resources For Cancer Patients and Caregivers:   Oncolink.org:  A wonderful resource for patients and healthcare providers for information regarding your disease, ways to tract your treatment, what to expect, etc.     [place]:  (650) [phone]  Can help patients locate various types of support and financial assistance  Cancer [facility]: 1-800-813-HOPE 317 514 9839) Provides financial assistance, online support groups, medication/co-pay assistance.    [place]:  [phone] Where to apply for food  stamps, Medicaid, and utility assistance  [facility]: 930-047-7183 Helps people with Medicare understand their rights and benefits, navigate the Medicare system, and secure the quality healthcare they deserve  [employer]: [employer]'s shared-ride transportation service for eligible riders who have a disability that prevents them from riding the fixed route bus.    For additional information on assistance programs please contact our social worker:   [place]:  [phone] Leuprolide injection What is this medicine? LEUPROLIDE (loo PROE lide) is a man-made hormone. It is used to treat the symptoms of prostate cancer. This medicine may also be used to treat children with early onset of puberty. It may be used for other hormonal conditions. This medicine may be used for other purposes; ask your health care provider or pharmacist if you have questions. COMMON BRAND NAME(S): Lupron What should I tell my health care provider before I take this medicine? They need to know if you have any of these conditions: -diabetes -heart disease or previous heart attack -high blood pressure -high cholesterol -pain or difficulty passing urine -spinal cord metastasis -stroke -tobacco smoker -an unusual or allergic reaction to leuprolide, benzyl alcohol, other medicines, foods, dyes, or preservatives -pregnant or trying to get pregnant -breast-feeding How should I use this medicine? This medicine is for injection under the skin or into a muscle. You will be taught how to prepare and give this medicine. Use exactly as directed. Take your medicine at regular intervals. Do not take your medicine more often than directed. It is important that you put your used needles and syringes in a special sharps container. Do not put them in a trash  can. If you do not have a sharps container, call your pharmacist or healthcare provider to get one. A special MedGuide will  be given to you by the pharmacist with each prescription and refill. Be sure to read this information carefully each time. Talk to your pediatrician regarding the use of this medicine in children. While this medicine may be prescribed for children as young as 8 years for selected conditions, precautions do apply. Overdosage: If you think you have taken too much of this medicine contact a poison control center or emergency room at once. NOTE: This medicine is only for you. Do not share this medicine with others. What if I miss a dose? If you miss a dose, take it as soon as you can. If it is almost time for your next dose, take only that dose. Do not take double or extra doses. What may interact with this medicine? Do not take this medicine with any of the following medications: -chasteberry This medicine may also interact with the following medications: -herbal or dietary supplements, like black cohosh or DHEA -male hormones, like estrogens or progestins and birth control pills, patches, rings, or injections -male hormones, like testosterone This list may not describe all possible interactions. Give your health care provider a list of all the medicines, herbs, non-prescription drugs, or dietary supplements you use. Also tell them if you smoke, drink alcohol, or use illegal drugs. Some items may interact with your medicine. What should I watch for while using this medicine? Visit your doctor or health care professional for regular checks on your progress. During the first week, your symptoms may get worse, but then will improve as you continue your treatment. You may get hot flashes, increased bone pain, increased difficulty passing urine, or an aggravation of nerve symptoms. Discuss these effects with your doctor or health care professional, some of them may improve with continued use of this medicine. Male patients may experience a menstrual cycle or spotting during the first 2 months of therapy  with this medicine. If this continues, contact your doctor or health care professional. What side effects may I notice from receiving this medicine? Side effects that you should report to your doctor or health care professional as soon as possible: -allergic reactions like skin rash, itching or hives, swelling of the face, lips, or tongue -breathing problems -chest pain -depression or memory disorders -pain in your legs or groin -pain at site where injected -severe headache -swelling of the feet and legs -visual changes -vomiting Side effects that usually do not require medical attention (report to your doctor or health care professional if they continue or are bothersome): -breast swelling or tenderness -decrease in sex drive or performance -diarrhea -hot flashes -loss of appetite -muscle, joint, or bone pains -nausea -redness or irritation at site where injected -skin problems or acne This list may not describe all possible side effects. Call your doctor for medical advice about side effects. You may report side effects to FDA at 1-800-FDA-1088. Where should I keep my medicine? Keep out of the reach of children. Store below 25 degrees C (77 degrees F). Do not freeze. Protect from light. Do not use if it is not clear or if there are particles present. Throw away any unused medicine after the expiration date. NOTE: This sheet is a summary. It may not cover all possible information. If you have questions about this medicine, talk to your doctor, pharmacist, or health care provider.  2018 Elsevier/Gold Standard (2015-12-06 10:54:35)  Denosumab  injection What is this medicine? DENOSUMAB (den oh sue mab) slows bone breakdown. Prolia is used to treat osteoporosis in women after menopause and in men. Delton See is used to treat a high calcium level due to cancer and to prevent bone fractures and other bone problems caused by multiple myeloma or cancer bone metastases. Delton See is also used to  treat giant cell tumor of the bone. This medicine may be used for other purposes; ask your health care provider or pharmacist if you have questions. COMMON BRAND NAME(S): Prolia, XGEVA What should I tell my health care provider before I take this medicine? They need to know if you have any of these conditions: -dental disease -having surgery or tooth extraction -infection -kidney disease -low levels of calcium or Vitamin D in the blood -malnutrition -on hemodialysis -skin conditions or sensitivity -thyroid or parathyroid disease -an unusual reaction to denosumab, other medicines, foods, dyes, or preservatives -pregnant or trying to get pregnant -breast-feeding How should I use this medicine? This medicine is for injection under the skin. It is given by a health care professional in a hospital or clinic setting. If you are getting Prolia, a special MedGuide will be given to you by the pharmacist with each prescription and refill. Be sure to read this information carefully each time. For Prolia, talk to your pediatrician regarding the use of this medicine in children. Special care may be needed. For Delton See, talk to your pediatrician regarding the use of this medicine in children. While this drug may be prescribed for children as young as 13 years for selected conditions, precautions do apply. Overdosage: If you think you have taken too much of this medicine contact a poison control center or emergency room at once. NOTE: This medicine is only for you. Do not share this medicine with others. What if I miss a dose? It is important not to miss your dose. Call your doctor or health care professional if you are unable to keep an appointment. What may interact with this medicine? Do not take this medicine with any of the following medications: -other medicines containing denosumab This medicine may also interact with the following medications: -medicines that lower your chance of fighting  infection -steroid medicines like prednisone or cortisone This list may not describe all possible interactions. Give your health care provider a list of all the medicines, herbs, non-prescription drugs, or dietary supplements you use. Also tell them if you smoke, drink alcohol, or use illegal drugs. Some items may interact with your medicine. What should I watch for while using this medicine? Visit your doctor or health care professional for regular checks on your progress. Your doctor or health care professional may order blood tests and other tests to see how you are doing. Call your doctor or health care professional for advice if you get a fever, chills or sore throat, or other symptoms of a cold or flu. Do not treat yourself. This drug may decrease your body's ability to fight infection. Try to avoid being around people who are sick. You should make sure you get enough calcium and vitamin D while you are taking this medicine, unless your doctor tells you not to. Discuss the foods you eat and the vitamins you take with your health care professional. See your dentist regularly. Brush and floss your teeth as directed. Before you have any dental work done, tell your dentist you are receiving this medicine. Do not become pregnant while taking this medicine or for 5 months after stopping  it. Talk with your doctor or health care professional about your birth control options while taking this medicine. Women should inform their doctor if they wish to become pregnant or think they might be pregnant. There is a potential for serious side effects to an unborn child. Talk to your health care professional or pharmacist for more information. What side effects may I notice from receiving this medicine? Side effects that you should report to your doctor or health care professional as soon as possible: -allergic reactions like skin rash, itching or hives, swelling of the face, lips, or tongue -bone pain -breathing  problems -dizziness -jaw pain, especially after dental work -redness, blistering, peeling of the skin -signs and symptoms of infection like fever or chills; cough; sore throat; pain or trouble passing urine -signs of low calcium like fast heartbeat, muscle cramps or muscle pain; pain, tingling, numbness in the hands or feet; seizures -unusual bleeding or bruising -unusually weak or tired Side effects that usually do not require medical attention (report to your doctor or health care professional if they continue or are bothersome): -constipation -diarrhea -headache -joint pain -loss of appetite -muscle pain -runny nose -tiredness -upset stomach This list may not describe all possible side effects. Call your doctor for medical advice about side effects. You may report side effects to FDA at 1-800-FDA-1088. Where should I keep my medicine? This medicine is only given in a clinic, doctor's office, or other health care setting and will not be stored at home. NOTE: This sheet is a summary. It may not cover all possible information. If you have questions about this medicine, talk to your doctor, pharmacist, or health care provider.  2018 Elsevier/Gold Standard (2016-05-09 19:17:21)

## 2017-08-09 NOTE — Telephone Encounter (Signed)
Scheduled appt per  4/11 los - Gave patient aVS and calender per los.

## 2017-08-09 NOTE — Progress Notes (Signed)
08/09/17 @ 1430  Order Clarification:  Calcium 8.8 OK to give Xgeva today + 2 Tums 500 mg then patient to take calcium supplement at home.  Lupron depot dose for today should be 7.5 mg and again in 1 month then convert to q 3 month 22.5 mg dose.  V.O. Dr Angelina Sheriff  Henreitta Leber, PharmD

## 2017-08-12 ENCOUNTER — Encounter: Payer: Self-pay | Admitting: Family Medicine

## 2017-08-13 ENCOUNTER — Encounter (HOSPITAL_COMMUNITY)
Admission: RE | Admit: 2017-08-13 | Discharge: 2017-08-13 | Disposition: A | Discharge status: Home / Self Care | Payer: Medicare Other | Source: Ambulatory Visit | Attending: Hematology | Admitting: Hematology

## 2017-08-13 ENCOUNTER — Other Ambulatory Visit: Payer: Self-pay

## 2017-08-13 DIAGNOSIS — C61 Malignant neoplasm of prostate: Secondary | ICD-10-CM | POA: Insufficient documentation

## 2017-08-13 MED ORDER — TAMSULOSIN HCL 0.4 MG PO CAPS: 0.4000 mg | ORAL_CAPSULE | Freq: Two times a day (BID) | ORAL | 1 refills | Status: DC

## 2017-08-13 MED ORDER — TAMSULOSIN HCL 0.4 MG PO CAPS: 0.4000 mg | ORAL_CAPSULE | Freq: Two times a day (BID) | ORAL | 0 refills | Status: DC

## 2017-08-13 MED ORDER — TECHNETIUM TC 99M MEDRONATE IV KIT
25.0000 | PACK | Freq: Once | INTRAVENOUS | Status: AC | PRN
  Administered 2017-08-13: 25 via INTRAVENOUS

## 2017-08-13 MED ORDER — TRAMADOL HCL 50 MG PO TABS: 50.0000 mg | ORAL_TABLET | Freq: Two times a day (BID) | ORAL | 0 refills | Status: DC

## 2017-08-20 ENCOUNTER — Encounter: Payer: Self-pay | Admitting: *Deleted

## 2017-08-21 ENCOUNTER — Inpatient Hospital Stay (HOSPITAL_BASED_OUTPATIENT_CLINIC_OR_DEPARTMENT_OTHER): Payer: Medicare Other | Admitting: Medical

## 2017-08-21 ENCOUNTER — Inpatient Hospital Stay: Payer: Medicare Other

## 2017-08-21 ENCOUNTER — Encounter: Payer: Self-pay | Admitting: *Deleted

## 2017-08-21 VITALS — BP 123/56 | HR 101 | Temp 98.1°F | Resp 18 | Ht 67.0 in | Wt 189.3 lb

## 2017-08-21 DIAGNOSIS — Z87891 Personal history of nicotine dependence: Secondary | ICD-10-CM | POA: Diagnosis not present

## 2017-08-21 DIAGNOSIS — C61 Malignant neoplasm of prostate: Secondary | ICD-10-CM

## 2017-08-21 DIAGNOSIS — I2510 Atherosclerotic heart disease of native coronary artery without angina pectoris: Secondary | ICD-10-CM

## 2017-08-21 DIAGNOSIS — D539 Nutritional anemia, unspecified: Secondary | ICD-10-CM | POA: Diagnosis not present

## 2017-08-21 DIAGNOSIS — C7951 Secondary malignant neoplasm of bone: Principal | ICD-10-CM

## 2017-08-21 DIAGNOSIS — I10 Essential (primary) hypertension: Secondary | ICD-10-CM

## 2017-08-21 DIAGNOSIS — K219 Gastro-esophageal reflux disease without esophagitis: Secondary | ICD-10-CM | POA: Diagnosis not present

## 2017-08-21 DIAGNOSIS — R748 Abnormal levels of other serum enzymes: Secondary | ICD-10-CM | POA: Diagnosis not present

## 2017-08-21 DIAGNOSIS — D6182 Myelophthisis: Secondary | ICD-10-CM

## 2017-08-21 DIAGNOSIS — Z79899 Other long term (current) drug therapy: Secondary | ICD-10-CM | POA: Diagnosis not present

## 2017-08-21 DIAGNOSIS — Z8673 Personal history of transient ischemic attack (TIA), and cerebral infarction without residual deficits: Secondary | ICD-10-CM

## 2017-08-21 DIAGNOSIS — R740 Nonspecific elevation of levels of transaminase and lactic acid dehydrogenase [LDH]: Secondary | ICD-10-CM | POA: Diagnosis not present

## 2017-08-21 DIAGNOSIS — R5383 Other fatigue: Secondary | ICD-10-CM | POA: Diagnosis not present

## 2017-08-21 DIAGNOSIS — E785 Hyperlipidemia, unspecified: Secondary | ICD-10-CM

## 2017-08-21 DIAGNOSIS — D509 Iron deficiency anemia, unspecified: Secondary | ICD-10-CM

## 2017-08-21 DIAGNOSIS — Z955 Presence of coronary angioplasty implant and graft: Secondary | ICD-10-CM

## 2017-08-21 DIAGNOSIS — D649 Anemia, unspecified: Secondary | ICD-10-CM

## 2017-08-21 DIAGNOSIS — N400 Enlarged prostate without lower urinary tract symptoms: Secondary | ICD-10-CM | POA: Diagnosis not present

## 2017-08-21 DIAGNOSIS — D638 Anemia in other chronic diseases classified elsewhere: Secondary | ICD-10-CM | POA: Diagnosis not present

## 2017-08-21 DIAGNOSIS — I129 Hypertensive chronic kidney disease with stage 1 through stage 4 chronic kidney disease, or unspecified chronic kidney disease: Secondary | ICD-10-CM | POA: Diagnosis not present

## 2017-08-21 DIAGNOSIS — N189 Chronic kidney disease, unspecified: Secondary | ICD-10-CM | POA: Diagnosis not present

## 2017-08-21 LAB — CBC WITH DIFFERENTIAL (CANCER CENTER ONLY)
Basophils Absolute: 0 10*3/uL (ref 0.0–0.1)
Basophils Relative: 0 %
Eosinophils Absolute: 0.1 10*3/uL (ref 0.0–0.5)
Eosinophils Relative: 1 %
HCT: 27.8 % — ABNORMAL LOW (ref 38.4–49.9)
Hemoglobin: 8.7 g/dL — ABNORMAL LOW (ref 13.0–17.1)
LYMPHS PCT: 30 %
Lymphs Abs: 1.8 10*3/uL (ref 0.9–3.3)
MCH: 30.2 pg (ref 27.2–33.4)
MCHC: 31.3 g/dL — AB (ref 32.0–36.0)
MCV: 96.5 fL (ref 79.3–98.0)
MONO ABS: 0.5 10*3/uL (ref 0.1–0.9)
MONOS PCT: 8 %
NEUTROS ABS: 3.6 10*3/uL (ref 1.5–6.5)
NRBC: 7 /100{WBCs} — AB
Neutrophils Relative %: 61 %
Platelet Count: 143 10*3/uL (ref 140–400)
RBC: 2.88 MIL/uL — AB (ref 4.20–5.82)
RDW: 25.4 % — ABNORMAL HIGH (ref 11.0–14.6)
WBC: 5.9 10*3/uL (ref 4.0–10.3)

## 2017-08-21 LAB — CMP (CANCER CENTER ONLY)
ALT: 10 U/L (ref 0–55)
AST: 17 U/L (ref 5–34)
Albumin: 3.4 g/dL — ABNORMAL LOW (ref 3.5–5.0)
Alkaline Phosphatase: 547 U/L — ABNORMAL HIGH (ref 40–150)
Anion gap: 11 (ref 3–11)
BILIRUBIN TOTAL: 0.6 mg/dL (ref 0.2–1.2)
BUN: 25 mg/dL (ref 7–26)
CO2: 20 mmol/L — ABNORMAL LOW (ref 22–29)
Calcium: 7.2 mg/dL — ABNORMAL LOW (ref 8.4–10.4)
Chloride: 106 mmol/L (ref 98–109)
Creatinine: 2 mg/dL — ABNORMAL HIGH (ref 0.70–1.30)
GFR, EST AFRICAN AMERICAN: 33 mL/min — AB (ref >60)
GFR, EST NON AFRICAN AMERICAN: 29 mL/min — AB (ref >60)
Glucose, Bld: 105 mg/dL (ref 70–140)
Potassium: 4.6 mmol/L (ref 3.5–5.1)
Sodium: 137 mmol/L (ref 136–145)
TOTAL PROTEIN: 7 g/dL (ref 6.4–8.3)

## 2017-08-21 LAB — RETICULOCYTES
RBC.: 2.88 MIL/uL — AB (ref 4.20–5.82)
Retic Count, Absolute: 95 10*3/uL — ABNORMAL HIGH (ref 34.8–93.9)
Retic Ct Pct: 3.3 % — ABNORMAL HIGH (ref 0.8–1.8)

## 2017-08-21 LAB — PREPARE RBC (CROSSMATCH)

## 2017-08-21 LAB — ABO/RH: ABO/RH(D): O POS

## 2017-08-21 NOTE — Progress Notes (Signed)
Pt to receive blood tomorrow at 0900 in [facility].  Pt getting type and screen done today.  Pt encouraged to wear blue blood band until tomorrow so he can receive blood without delay.  Verbalized understanding.

## 2017-08-21 NOTE — Patient Instructions (Signed)
Anemia Anemia is a condition in which you do not have enough red blood cells or hemoglobin. Hemoglobin is a substance in red blood cells that carries oxygen. When you do not have enough red blood cells or hemoglobin (are anemic), your body cannot get enough oxygen and your organs may not work properly. As a result, you may feel very tired or have other problems. What are the causes? Common causes of anemia include:  Excessive bleeding. Anemia can be caused by excessive bleeding inside or outside the body, including bleeding from the intestine or from periods in women.  Poor nutrition.  Long-lasting (chronic) kidney, thyroid, and liver disease.  Bone marrow disorders.  Cancer and treatments for cancer.  HIV (human immunodeficiency virus) and AIDS (acquired immunodeficiency syndrome).  Treatments for HIV and AIDS.  Spleen problems.  Blood disorders.  Infections, medicines, and autoimmune disorders that destroy red blood cells.  What are the signs or symptoms? Symptoms of this condition include:  Minor weakness.  Dizziness.  Headache.  Feeling heartbeats that are irregular or faster than normal (palpitations).  Shortness of breath, especially with exercise.  Paleness.  Cold sensitivity.  Indigestion.  Nausea.  Difficulty sleeping.  Difficulty concentrating.  Symptoms may occur suddenly or develop slowly. If your anemia is mild, you may not have symptoms. How is this diagnosed? This condition is diagnosed based on:  Blood tests.  Your medical history.  A physical exam.  Bone marrow biopsy.  Your health care provider may also check your stool (feces) for blood and may do additional testing to look for the cause of your bleeding. You may also have other tests, including:  Imaging tests, such as a CT scan or MRI.  Endoscopy.  Colonoscopy.  How is this treated? Treatment for this condition depends on the cause. If you continue to lose a lot of blood,  you may need to be treated at a hospital. Treatment may include:  Taking supplements of iron, vitamin B12, or folic acid.  Taking a hormone medicine (erythropoietin) that can help to stimulate red blood cell growth.  Having a blood transfusion. This may be needed if you lose a lot of blood.  Making changes to your diet.  Having surgery to remove your spleen.  Follow these instructions at home:  Take over-the-counter and prescription medicines only as told by your health care provider.  Take supplements only as told by your health care provider.  Follow any diet instructions that you were given.  Keep all follow-up visits as told by your health care provider. This is important. Contact a health care provider if:  You develop new bleeding anywhere in the body. Get help right away if:  You are very weak.  You are short of breath.  You have pain in your abdomen or chest.  You are dizzy or feel faint.  You have trouble concentrating.  You have bloody or black, tarry stools.  You vomit repeatedly or you vomit up blood. Summary  Anemia is a condition in which you do not have enough red blood cells or enough of a substance in your red blood cells that carries oxygen (hemoglobin).  Symptoms may occur suddenly or develop slowly.  If your anemia is mild, you may not have symptoms.  This condition is diagnosed with blood tests as well as a medical history and physical exam. Other tests may be needed.  Treatment for this condition depends on the cause of the anemia. This information is not intended to replace advice   given to you by your health care provider. Make sure you discuss any questions you have with your health care provider. Document Released: 05/25/2004 Document Revised: 05/19/2016 Document Reviewed: 05/19/2016 Elsevier Interactive Patient Education  [employer].

## 2017-08-22 ENCOUNTER — Ambulatory Visit (HOSPITAL_COMMUNITY)
Admission: RE | Admit: 2017-08-22 | Discharge: 2017-08-22 | Disposition: A | Discharge status: Home / Self Care | Payer: Medicare Other | Source: Ambulatory Visit | Attending: Medical | Admitting: Medical

## 2017-08-22 DIAGNOSIS — Z79899 Other long term (current) drug therapy: Secondary | ICD-10-CM | POA: Diagnosis not present

## 2017-08-22 DIAGNOSIS — Z8673 Personal history of transient ischemic attack (TIA), and cerebral infarction without residual deficits: Secondary | ICD-10-CM | POA: Diagnosis not present

## 2017-08-22 DIAGNOSIS — E785 Hyperlipidemia, unspecified: Secondary | ICD-10-CM | POA: Diagnosis not present

## 2017-08-22 DIAGNOSIS — N400 Enlarged prostate without lower urinary tract symptoms: Secondary | ICD-10-CM | POA: Diagnosis not present

## 2017-08-22 DIAGNOSIS — D509 Iron deficiency anemia, unspecified: Secondary | ICD-10-CM | POA: Insufficient documentation

## 2017-08-22 DIAGNOSIS — C61 Malignant neoplasm of prostate: Secondary | ICD-10-CM | POA: Diagnosis not present

## 2017-08-22 DIAGNOSIS — Z87891 Personal history of nicotine dependence: Secondary | ICD-10-CM | POA: Diagnosis not present

## 2017-08-22 DIAGNOSIS — N189 Chronic kidney disease, unspecified: Secondary | ICD-10-CM | POA: Diagnosis not present

## 2017-08-22 DIAGNOSIS — C7951 Secondary malignant neoplasm of bone: Secondary | ICD-10-CM | POA: Diagnosis not present

## 2017-08-22 DIAGNOSIS — I129 Hypertensive chronic kidney disease with stage 1 through stage 4 chronic kidney disease, or unspecified chronic kidney disease: Secondary | ICD-10-CM | POA: Diagnosis not present

## 2017-08-22 DIAGNOSIS — R740 Nonspecific elevation of levels of transaminase and lactic acid dehydrogenase [LDH]: Secondary | ICD-10-CM | POA: Diagnosis not present

## 2017-08-22 DIAGNOSIS — D539 Nutritional anemia, unspecified: Secondary | ICD-10-CM | POA: Diagnosis not present

## 2017-08-22 DIAGNOSIS — R748 Abnormal levels of other serum enzymes: Secondary | ICD-10-CM | POA: Diagnosis not present

## 2017-08-22 DIAGNOSIS — K219 Gastro-esophageal reflux disease without esophagitis: Secondary | ICD-10-CM | POA: Diagnosis not present

## 2017-08-22 MED ORDER — ACETAMINOPHEN 325 MG PO TABS
650.0000 mg | ORAL_TABLET | Freq: Once | ORAL | Status: AC
  Administered 2017-08-22: 650 mg via ORAL
  Filled 2017-08-22: qty 2

## 2017-08-22 MED ORDER — FUROSEMIDE 10 MG/ML IJ SOLN
20.0000 mg | Freq: Once | INTRAMUSCULAR | Status: AC
  Administered 2017-08-22: 20 mg via INTRAVENOUS
  Filled 2017-08-22: qty 2

## 2017-08-22 NOTE — Progress Notes (Addendum)
PATIENT CARE CENTER NOTE  Diagnosis: Iron Deficiency Anemia    Provider: Dr. Satira Sark   Procedure: 2 units PRBC's    Note: Patient received 2 units of blood. Patient tolerated transfusion well with no adverse reaction. Pre-medications given. Vital signs remained stable throughout transfusion.  Discharge instructions given to patient. Patient alert, oriented and ambulatory at discharge.

## 2017-08-22 NOTE — Discharge Instructions (Signed)

## 2017-08-23 ENCOUNTER — Ambulatory Visit: Payer: Medicare Other | Admitting: Adult Health

## 2017-08-23 ENCOUNTER — Other Ambulatory Visit: Payer: Medicare Other

## 2017-08-23 LAB — BPAM RBC
BLOOD PRODUCT EXPIRATION DATE: 201905172359
Blood Product Expiration Date: 201905172359
ISSUE DATE / TIME: 201904241049
ISSUE DATE / TIME: 201904241049
UNIT TYPE AND RH: 5100
UNIT TYPE AND RH: 5100

## 2017-08-23 LAB — TYPE AND SCREEN
ABO/RH(D): O POS
ANTIBODY SCREEN: NEGATIVE
Unit division: 0
Unit division: 0

## 2017-08-24 NOTE — Progress Notes (Signed)
[facility] Progress Note   Tony [name] [id number] Sep 29, 1930 84 y.o.  Tony [name] is managed by Dr. Sullivan Lone  Actively treated with chemotherapy: no  Current Therapy: Lupron and Xgeva   Assessment: Plan:    Anemia in other chronic diseases classified elsewhere - Plan: Type and screen, POC Hemoccult Bld/Stl (3-Cd Home Screen), ABO/Rh  Iron deficiency anemia, unspecified iron deficiency anemia type - Plan: POC Hemoccult Bld/Stl (3-Cd Home Screen)  Other fatigue - Plan: POC Hemoccult Bld/Stl (3-Cd Home Screen)  Symptomatic anemia - Plan: Type and screen, POC Hemoccult Bld/Stl (3-Cd Home Screen), ABO/Rh  Anemia of chronic disease, symptomatic anemia: A CBC returned today showing a hemoglobin of 8.7 and hematocrit of 27.8.  These were found to be 9.6 and 30.4 respectively at his last visit 3 weeks ago.  I offered the patient 1 unit of packed red blood cells based on his symptomatology.  He prefers to be transfused with 2 units of packed red blood cells.  He will return tomorrow for a transfusion of 2 units of packed red blood cells in the sickle clinic.  Please see After Visit Summary for patient specific instructions.  Future Appointments  Date Time Provider [place]  09/06/2017 11:30 AM [facility] LAB 4 [facility] None  09/06/2017 12:00 PM Brunetta Genera, MD [facility] None  09/06/2017  1:15 PM [facility] INJ NURSE [facility] None  10/04/2017  1:00 PM [facility] LAB 6 [facility] None  10/04/2017  1:40 PM Brunetta Genera, MD [facility] None  10/04/2017  2:15 PM [facility] INJ NURSE [facility] None    Orders Placed This Encounter  Procedures  . Type and screen  . ABO/Rh       Subjective:   Patient ID:  Tony [name] is a 84 y.o. (DOB [birth year]) male.  Chief Complaint:  Chief Complaint  Patient presents with  . Dizziness  . Shortness of Breath    HPI Tony [name] is a most pleasant elderly male with a history of  metastatic prostate cancer currently managed with Lupron and Xgeva.  Additionally he has a history of iron deficiency and anemia due to chronic disease.  He was last seen 3 weeks ago at which time his hemoglobin was 9.6 and his hematocrit was 30.4.  Today he presents with report of shortness of breath and positional dizziness.  He also has dyspnea on exertion with activities of daily living.  He is noted cramps in his thighs bilaterally.  He denies chest pains, chest tightness, nausea, vomiting, or diarrhea.  Medications: I have reviewed the patient's current medications.  Allergies: No Known Allergies  Past Medical History:  Diagnosis Date  . BPH associated with nocturia    flomax 0.4--> 0.8 mg trial. nocturia if has coffee. some incontinence  . CAD (coronary artery disease)    LAD Stent 2012. 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   . Gout    no rx. apparently 1x in past  . History of stroke    no aspirin before stroke. no deficits. slurred words at time of stroke  . Hyperlipidemia   . Hypertension     Past Surgical History:  Procedure Laterality Date  . CARDIAC SURGERY     Stint  . CORONARY STENT PLACEMENT    . left arm fracture s/p surgery    . right leg fracture s.p surgery- screws like arm      Family History  Problem Relation Age of Onset  .  Breast cancer Mother   . Heart disease Father   . Heart disease Brother        x2  . Prostate cancer Brother     Social History   Socioeconomic History  . Marital status: Married    Spouse name: Not on file  . Number of children: Not on file  . Years of education: Not on file  . Highest education level: Not on file  Occupational History  . Not on file  Social Needs  . Financial resource strain: Not on file  . Food insecurity:    Worry: Not on file    Inability: Not on file  . Transportation needs:    Medical: Not on file    Non-medical: Not on file  Tobacco Use  . Smoking status: Former Smoker      Packs/day: 0.50    Years: 1.00    Pack years: 0.50    Types: Cigarettes    Last attempt to quit: 05/01/1952    Years since quitting: 65.3  . Smokeless tobacco: Never Used  Substance and Sexual Activity  . Alcohol use: No  . Drug use: No  . Sexual activity: Not on file  Lifestyle  . Physical activity:    Days per week: Not on file    Minutes per session: Not on file  . Stress: Not on file  Relationships  . Social connections:    Talks on phone: Not on file    Gets together: Not on file    Attends religious service: Not on file    Active member of club or organization: Not on file    Attends meetings of clubs or organizations: Not on file    Relationship status: Not on file  . Intimate partner violence:    Fear of current or ex partner: Not on file    Emotionally abused: Not on file    Physically abused: Not on file    Forced sexual activity: Not on file  Other Topics Concern  . Not on file  Social History Narrative   Married- lives separate from wife. 2 children. Daughter passed from heart attack.       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   Please see review of systems for further details on the patient's review from today.   Review of Systems:  Review of Systems  Constitutional: Negative for chills, diaphoresis and fever.  Respiratory: Positive for shortness of breath. Negative for cough, chest tightness and wheezing.   Cardiovascular: Negative for chest pain, palpitations and leg swelling.  Gastrointestinal: Negative for constipation, diarrhea, nausea and vomiting.  Neurological: Positive for dizziness. Negative for syncope and light-headedness.    Objective:   Physical Exam:  BP (!) 123/56 (BP Location: Left Arm, Patient Position: Sitting) Comment: MD aware  Pulse (!) 101   Temp 98.1 F (36.7 C)  (Oral)   Resp 18   Ht 5' 7" (1.702 m)   Wt 189 lb 4.8 oz (85.9 kg)   SpO2 100%   BMI 29.65 kg/m  ECOG: 1   Physical Exam  Constitutional: No distress.  HENT:  Head: Normocephalic and atraumatic.  Cardiovascular: Regular rhythm, S1 normal and S2 normal. Tachycardia present. Exam reveals no friction rub.  No murmur heard. Pulmonary/Chest: Effort normal and breath sounds normal. No respiratory distress. He has no wheezes.  He has no rales.  Neurological: He is alert.  Skin: Skin is warm and dry. No rash noted. He is not diaphoretic. No erythema.    Lab Review:     Component Value Date/Time   NA 137 08/21/2017 1010   K 4.6 08/21/2017 1010   CL 106 08/21/2017 1010   CO2 20 (L) 08/21/2017 1010   GLUCOSE 105 08/21/2017 1010   BUN 25 08/21/2017 1010   CREATININE 2.00 (H) 08/21/2017 1010   CALCIUM 7.2 (L) 08/21/2017 1010   PROT 7.0 08/21/2017 1010   ALBUMIN 3.4 (L) 08/21/2017 1010   AST 17 08/21/2017 1010   ALT 10 08/21/2017 1010   ALKPHOS 547 (H) 08/21/2017 1010   BILITOT 0.6 08/21/2017 1010   GFRNONAA 29 (L) 08/21/2017 1010   GFRAA 33 (L) 08/21/2017 1010       Component Value Date/Time   WBC 5.9 08/21/2017 1010   WBC 6.1 07/31/2017 0917   RBC 2.88 (L) 08/21/2017 1010   RBC 2.88 (L) 08/21/2017 1010   HGB 8.7 (L) 08/21/2017 1010   HCT 27.8 (L) 08/21/2017 1010   HCT 21.9 (L) 07/20/2017 2103   PLT 143 08/21/2017 1010   MCV 96.5 08/21/2017 1010   MCH 30.2 08/21/2017 1010   MCHC 31.3 (L) 08/21/2017 1010   RDW 25.4 (H) 08/21/2017 1010   LYMPHSABS 1.8 08/21/2017 1010   MONOABS 0.5 08/21/2017 1010   EOSABS 0.1 08/21/2017 1010   BASOSABS 0.0 08/21/2017 1010   -------------------------------  Imaging from last 24 hours (if applicable):  Radiology interpretation: Ct Abdomen Pelvis Wo Contrast  Result Date: 08/06/2017 CLINICAL DATA:  Abnormal liver function test and weight loss. EXAM: CT ABDOMEN AND PELVIS WITHOUT CONTRAST TECHNIQUE: Multidetector CT imaging of the  abdomen and pelvis was performed following the standard protocol without IV contrast. COMPARISON:  None FINDINGS: Lower chest: No acute abnormality. Hepatobiliary: No focal liver abnormality is seen. No gallstones, gallbladder wall thickening, or biliary dilatation. Pancreas: Unremarkable. No pancreatic ductal dilatation or surrounding inflammatory changes. Spleen: Normal in size without focal abnormality. Adrenals/Urinary Tract: The adrenal glands appear normal. Normal appearance of the right kidney. Normal appearance of the right kidney. There is left-sided pelvocaliectasis and mild hydroureter to the level of the urinary bladder. No kidney stones identified. Stomach/Bowel: Stomach is normal. The small bowel loops have a normal caliber. The appendix is visualized and appears normal. No pathologic dilatation of the colon. Extensive sigmoid diverticulosis identified without acute inflammation. Vascular/Lymphatic: Aortic atherosclerosis noted. No aneurysm. Extensive left retroperitoneal adenopathy is identified. Nodal mass at the level of the left renal hilum measures 4.2 x 3.1 cm, image 31/2. Bilateral pelvic adenopathy identified. Index node in the left pelvic sidewall measures 2.1 cm, image 56/2. Right pelvic sidewall node measures 1.9 cm, image 56/2. In the right inguinal region there is a large node measuring 3 cm, image 69/2. Reproductive: Prostate gland appears enlarged measuring 7.7 by 6.2 by 6.5 cm (volume = 160 cm^3). Other: No ascites.  No peritoneal nodularity. Musculoskeletal: Extensive sclerotic bone metastasis involves the lumbar spine, bony pelvis and proximal left femur. IMPRESSION: 1. Exam positive for left retroperitoneal, bilateral pelvis and right inguinal adenopathy. Findings may be the sequelae of metastatic disease or lymphoproliferative disorder. The right inguinal lymph node should be easily amendable to image guided core biopsy. 2. Diffuse sclerotic bone metastasis. 3. Marked enlargement of  the prostate gland which has a volume of approximately 160 cc. 4.  Aortic Atherosclerosis (ICD10-I70.0). Electronically Signed   By: Kerby Moors  M.D.   On: 08/06/2017 14:47   Ct Chest Wo Contrast  Result Date: 08/08/2017 CLINICAL DATA:  Newly diagnosed prostate cancer.  Metastatic workup. EXAM: CT CHEST WITHOUT CONTRAST TECHNIQUE: Multidetector CT imaging of the chest was performed following the standard protocol without IV contrast. COMPARISON:  CT abdomen/pelvis 08/06/2017 FINDINGS: Cardiovascular: Moderate cardiac enlargement. No pericardial effusion. Scattered aortic calcifications but no focal aneurysm. Coronary artery calcifications are noted. Mildly enlarged pulmonary artery suggesting pulmonary hypertension. Mediastinum/Nodes: Small scattered mediastinal and hilar lymph nodes but no mass or overt adenopathy. The esophagus is grossly normal. Calcified right infrahilar lymph nodes are noted along with a calcified granuloma at the right lung base. Lungs/Pleura: No worrisome pulmonary lesions to suggest pulmonary metastatic disease. A calcified granuloma is noted at the right lung base. A few tiny scattered subpleural nodules are likely lymph nodes. No acute pulmonary findings. No pleural effusion. Upper Abdomen: 11 mm retrocrural lymph node is noted on the right side. No other upper abdominal lymphadenopathy. Musculoskeletal: Diffuse sclerotic metastatic bone disease involving the spine, ribs and sternum. No obvious pathologic fracture or spinal canal compromise. Evidence of remote trauma involving the right clavicle. 13.5 mm left supraclavicular lymph node worrisome for metastatic adenopathy. No axillary adenopathy. IMPRESSION: 1. No CT findings for pulmonary metastatic disease. 2. Enlarged left supraclavicular node worrisome for metastatic disease. 3. Enlarged retrocrural lymph node noted. 4. Diffuse sclerotic osseous metastatic disease. Aortic Atherosclerosis (ICD10-I70.0). Electronically Signed   By:  Marijo Sanes M.D.   On: 08/08/2017 16:44   Nm Bone Scan Whole Body  Result Date: 08/13/2017 CLINICAL DATA:  Prostate cancer. EXAM: NUCLEAR MEDICINE WHOLE BODY BONE SCAN TECHNIQUE: Whole body anterior and posterior images were obtained approximately 3 hours after intravenous injection of radiopharmaceutical. RADIOPHARMACEUTICALS:  21.6 mCi Technetium-60mMDP IV COMPARISON:  CT 08/06/2016. FINDINGS: There is diffuse abnormal increased radiotracer activity within the axial and proximal appendicular skeleton compatible with widespread metastatic disease. Patchy areas of increased uptake within the shafts of both humeri and femurs noted are compatible with areas of metastasis. Diminished physiologic soft tissue and renal activity compatible with super scan. IMPRESSION: 1. Imaging findings compatible with widespread osseous metastatic disease involving the axial and proximal appendicular skeleton. Electronically Signed   By: TKerby MoorsM.D.   On: 08/13/2017 16:22   Ct Biopsy  Result Date: 07/31/2017 INDICATION: History of severe macrocytic anemia and thrombocytopenia, evaluate for MDS or other infiltrative process. EXAM: CT-GUIDED BONE MARROW BIOPSY AND ASPIRATION MEDICATIONS: None ANESTHESIA/SEDATION: Fentanyl 100 mcg IV; Versed 2 mg IV Sedation Time: 15 Minutes; The patient was continuously monitored during the procedure by the interventional radiology nurse under my direct supervision. COMPLICATIONS: None immediate. PROCEDURE: Informed consent was obtained from the patient following an explanation of the procedure, risks, benefits and alternatives. The patient understands, agrees and consents for the procedure. All questions were addressed. A time out was performed prior to the initiation of the procedure. The patient was positioned prone and non-contrast localization CT was performed of the pelvis to demonstrate the iliac marrow spaces. The operative site was prepped and draped in the usual sterile  fashion. Under sterile conditions and local anesthesia, a 22 gauge spinal needle was utilized for procedural planning. Next, an 11 gauge coaxial bone biopsy needle was advanced into the left iliac marrow space. Needle position was confirmed with CT imaging. Initially, bone marrow aspiration was performed. Next, a bone marrow biopsy was obtained with the 11 gauge outer bone marrow device. The 11 gauge coaxial bone biopsy needle  was re-advanced into a slightly different location within the left iliac marrow space, positioning was confirmed and an additional bone marrow biopsy was obtained. Samples were prepared with the cytotechnologist and deemed adequate. The needle was removed intact. Hemostasis was obtained with compression and a dressing was placed. The patient tolerated the procedure well without immediate post procedural complication. FINDINGS: Multiple sclerotic lesions are seen throughout the imaged pelvis, sacrum and lower lumbar spine. Index lesion within the left ilium measures approximately 1.5 x 1.2 cm (image 19, series 2). IMPRESSION: 1. Successful CT guided left iliac bone marrow aspiration and core biopsy. 2. CT imaging demonstrates multiple sclerotic lesions throughout the imaged pelvis, sacrum and imaged lower lumbar spine. Electronically Signed   By: Sandi Mariscal M.D.   On: 07/31/2017 12:28   Ct Bone Marrow Biopsy & Aspiration  Result Date: 07/31/2017 INDICATION: History of severe macrocytic anemia and thrombocytopenia, evaluate for MDS or other infiltrative process. EXAM: CT-GUIDED BONE MARROW BIOPSY AND ASPIRATION MEDICATIONS: None ANESTHESIA/SEDATION: Fentanyl 100 mcg IV; Versed 2 mg IV Sedation Time: 15 Minutes; The patient was continuously monitored during the procedure by the interventional radiology nurse under my direct supervision. COMPLICATIONS: None immediate. PROCEDURE: Informed consent was obtained from the patient following an explanation of the procedure, risks, benefits and  alternatives. The patient understands, agrees and consents for the procedure. All questions were addressed. A time out was performed prior to the initiation of the procedure. The patient was positioned prone and non-contrast localization CT was performed of the pelvis to demonstrate the iliac marrow spaces. The operative site was prepped and draped in the usual sterile fashion. Under sterile conditions and local anesthesia, a 22 gauge spinal needle was utilized for procedural planning. Next, an 11 gauge coaxial bone biopsy needle was advanced into the left iliac marrow space. Needle position was confirmed with CT imaging. Initially, bone marrow aspiration was performed. Next, a bone marrow biopsy was obtained with the 11 gauge outer bone marrow device. The 11 gauge coaxial bone biopsy needle was re-advanced into a slightly different location within the left iliac marrow space, positioning was confirmed and an additional bone marrow biopsy was obtained. Samples were prepared with the cytotechnologist and deemed adequate. The needle was removed intact. Hemostasis was obtained with compression and a dressing was placed. The patient tolerated the procedure well without immediate post procedural complication. FINDINGS: Multiple sclerotic lesions are seen throughout the imaged pelvis, sacrum and lower lumbar spine. Index lesion within the left ilium measures approximately 1.5 x 1.2 cm (image 19, series 2). IMPRESSION: 1. Successful CT guided left iliac bone marrow aspiration and core biopsy. 2. CT imaging demonstrates multiple sclerotic lesions throughout the imaged pelvis, sacrum and imaged lower lumbar spine. Electronically Signed   By: Sandi Mariscal M.D.   On: 07/31/2017 12:28

## 2017-09-05 NOTE — Progress Notes (Signed)
HEMATOLOGY/ONCOLOGY CLINIC NOTE  Date of Service: 09/06/17  Patient Care Team: Tony Barrack, MD as PCP - General (Family Medicine)  CHIEF COMPLAINTS/PURPOSE OF CONSULTATION:  -recently diagnosed metastatic prostate cancer -Myelopthisic anemia  HISTORY OF PRESENTING ILLNESS:   Tony [name] is a wonderful 84 y.o. male who has been referred to [place] by Dr Tony [name] for evaluation and management of anemia. He is accompanied today by his daughter. The pt reports that he is doing well overall.   The pt reports a new onset of fatigue that began in January 2019. His daughter notes being able to tell a difference in his energy levels as early as November 2018. He notes that some cramping pain in his thighs. He notes that his recent 07/23/17 blood transfusion has led to a "terrific, immediate change" in his thigh pain. However, his thigh pain has returned in the last couple days.  He notes that he takes 1070mg Vitamin B12 daily.   He notes that for 6-7 years he took iron pills after his 2012 heart attack and stroke. He notes that he took a statin for 6-7 months and was taken off of it recently. He notes no unresolved symptoms from his stroke.   The pt notes that over the last 4-5 months his appetite has diminished and he has subsequently lost about 20 lbs in that time.  He notes that he has historically not preferred to drink water as such, but hydrates with juice, coffee, and other drinks.  He notes that he believes that he is able to take care of himself adequately at home. He also notes that his cane is sufficient for helping him get around.   Most recent lab results (07/22/17) of CBC  is as follows: all values are WNL except for RBC at 2.94, Hgb at 8.7, HCT at 27.2, RDW at 26.5, Platelets at 134k. CBC from 10/30/16 revealed all values WNL.  CMP 07/21/17 revealed all values WNL except for Sodium at 134, Glucose at 101, Creatinine at 1.85, Calcium at 7.9, Total Protein at 5.7, Albumin at  2.7, Alk Phos at 433.  LDH 07/20/17 elevated at 256. Haptoglobin 07/20/17 elevated at 357.  Vitamin B12 07/06/17 is WNL at 229.   On review of systems, pt reports decreased appetite, losing 20 lbs over 4-5 months, bilateral thigh pain, fatigue, mild leg swelling, and denies fevers, chills, night sweats, bone pains, nausea, abdominal pains, bleeding, blood in the urine, blood in the stools, black stools, changes in bowel habits, light headednss, dizziness, abdominal pains, noticing any new lumps or bumps, testicular pain or swelling, and any other symptoms.   On PMHx the pt reports heart attack and stroke in 2012. On Social Hx the pt denies much ETOH consumption.   Interval History:  Mr. FDamere Brandenburgreturns today regarding his metastatic prostate cancer and myelopthisic anemia. The patient's last visit with [place] on 08/09/17. He is accompanied today by his daughter. The pt reports that he is doing well overall.   The pt reports that his blood transfusion helped his energy level a little bit. He also notes that his thigh pain persists, and denies pain radiating from his back into his legs. He describes this pain as a strong ache that gets worse when he tries to walk. He denies any isolated or sharp pain on his thighs. He notes that it is very difficult to turn in bed at night due to his thigh pain. He has been taking q 6  hours Tramadol which didn't relieve his pain, and has stopped using it as Tramadol worsened his constipation. He is taking stool softeners and prune juice for his constipation without complete relief. He is also continuing to take Vitamin D supplements twice a week.   The pt notes that he has some incontinence due to occasional urgency. He also is eating one meal a day but is trying to increase his food intake.   Pt denies hot flashes and any other problems taking the Lupron shot.   He will see Tony Jester, PA in cardiology on 09/27/17. He is taking Flomax twice a day.   Of  note since the patient's last visit, pt has had Bone Scan completed on 08/13/17 with results revealing 1. Imaging findings compatible with widespread osseous metastatic disease involving the axial and proximal appendicular skeleton.  Lab results today (09/06/17) of CBC, CMP, and Reticulocytes is as follows: all values are WNL except for RBC at 3.36, Hgb at 10.3, HCT at 32.5, MCHC at 31.7, RDW at 22.5, Platelets at 129k, Retic Ct Pct at 2.1%, Chloride at 111, BUN at 29, Creatinine at 2.00, Calcium at 6.8, Alk Phos at 480. Testosterone 09/06/17 is appropriately suppressed at 8. Vitamin D 25 hydroxy 09/06/17 is 39.6   On review of systems, pt reports leg pain, increased energy levels, occasional light headedness, constipation, improved appetite, occasional bladder incontinence, and denies radiating back pain, abdominal pains, hot flashes, and any other symptoms.    MEDICAL HISTORY:  Past Medical History:  Diagnosis Date  . BPH associated with nocturia    flomax 0.4--> 0.8 mg trial. nocturia if has coffee. some incontinence  . CAD (coronary artery disease)    LAD Stent 2012. 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   . Gout    no rx. apparently 1x in past  . History of stroke    no aspirin before stroke. no deficits. slurred words at time of stroke  . Hyperlipidemia   . Hypertension     SURGICAL HISTORY: Past Surgical History:  Procedure Laterality Date  . CARDIAC SURGERY     Stint  . CORONARY STENT PLACEMENT    . left arm fracture s/p surgery    . right leg fracture s.p surgery- screws like arm      SOCIAL HISTORY: Social History   Socioeconomic History  . Marital status: Married    Spouse name: Not on file  . Number of children: Not on file  . Years of education: Not on file  . Highest education level: Not on file  Occupational History  . Not on file  Social Needs  . Financial resource strain: Not on file  . Food insecurity:    Worry: Not on file     Inability: Not on file  . Transportation needs:    Medical: Not on file    Non-medical: Not on file  Tobacco Use  . Smoking status: Former Smoker    Packs/day: 0.50    Years: 1.00    Pack years: 0.50    Types: Cigarettes    Last attempt to quit: 05/01/1952    Years since quitting: 65.4  . Smokeless tobacco: Never Used  Substance and Sexual Activity  . Alcohol use: No  . Drug use: No  . Sexual activity: Not on file  Lifestyle  . Physical activity:    Days per week: Not on file    Minutes per session: Not on file  . Stress: Not  on file  Relationships  . Social connections:    Talks on phone: Not on file    Gets together: Not on file    Attends religious service: Not on file    Active member of club or organization: Not on file    Attends meetings of clubs or organizations: Not on file    Relationship status: Not on file  . Intimate partner violence:    Fear of current or ex partner: Not on file    Emotionally abused: Not on file    Physically abused: Not on file    Forced sexual activity: Not on file  Other Topics Concern  . Not on file  Social History Narrative   Married- lives separate from wife. 2 children. Daughter passed from heart attack.       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 Breast cancer Mother   . Heart disease Father   . Heart disease Brother        x2  . Prostate cancer Brother     ALLERGIES:  has No Known Allergies.  MEDICATIONS:  Current Outpatient Medications  Medication Sig Dispense Refill  . amLODipine (NORVASC) 5 MG tablet Take 1 tablet (5 mg total) by mouth daily. (Patient taking differently: Take 5 mg by mouth at bedtime. ) 90 tablet 3  . aspirin EC 81 MG tablet Take 1 tablet (81 mg total) by mouth daily. (Patient taking differently: Take 81 mg by mouth at bedtime. ) 90 tablet 3  . calcium-vitamin D (OSCAL 500/200 D-3) 500-200  MG-UNIT tablet Take 1 tablet by mouth daily with breakfast. 30 tablet 2  . ergocalciferol (VITAMIN D2) 50000 units capsule Take 1 capsule (50,000 Units total) by mouth 2 (two) times a week. 24 capsule 1  . isosorbide-hydrALAZINE (BIDIL) 20-37.5 MG tablet Take 1 tablet by mouth 3 (three) times daily. (Patient taking differently: Take 1 tablet by mouth 2 (two) times daily. ) 270 tablet 1  . metoprolol succinate (TOPROL-XL) 25 MG 24 hr tablet Take 1 tablet (25 mg total) by mouth daily. (Patient taking differently: Take 25 mg by mouth at bedtime. ) 90 tablet 3  . Multiple Vitamins-Minerals (MULTI COMPLETE PO) Take by mouth.    . nitroGLYCERIN (NITROSTAT) 0.4 MG SL tablet Place 1 tablet (0.4 mg total) under the tongue every 5 (five) minutes as needed for chest pain (3 maximum before seeking care). 30 tablet 0  . Omega-3 1000 MG CAPS Take by mouth.    . tamsulosin (FLOMAX) 0.4 MG CAPS capsule Take 1 capsule (0.4 mg total) by mouth 2 (two) times daily. 180 capsule 0  . traMADol (ULTRAM) 50 MG tablet Take by mouth every 6 (six) hours as needed.    . vitamin B-12 (CYANOCOBALAMIN) 1000 MCG tablet Take 1 tablet (1,000 mcg total) by mouth daily. 30 tablet 0   No current facility-administered medications for this visit.     REVIEW OF SYSTEMS:    A 10+ POINT REVIEW OF SYSTEMS WAS OBTAINED including neurology, dermatology, psychiatry, cardiac, respiratory, lymph, extremities, GI, GU, Musculoskeletal, constitutional, breasts, reproductive, HEENT.  All pertinent positives are noted in the HPI.  All others are negative.   PHYSICAL EXAMINATION:  . Vitals:   09/06/17 1158  BP: 116/61  Pulse: 80  Resp: 19  Temp: 98.1 F (36.7 C)  SpO2: 100%   Filed Weights   09/06/17  1158  Weight: 185 lb 14.4 oz (84.3 kg)   .Body mass index is 29.12 kg/m.  GENERAL:alert, in no acute distress and comfortable SKIN: no acute rashes, no significant lesions EYES: conjunctiva pallor noted, sclera anicteric OROPHARYNX:  MMM, no exudates, no oropharyngeal erythema or ulceration NECK: supple, no JVD LYMPH:  no palpable lymphadenopathy in the cervical, axillary or inguinal regions LUNGS: clear to auscultation b/l with normal respiratory effort HEART: regular rate & rhythm ABDOMEN:  normoactive bowel sounds , non tender, not distended. Extremity: 1+ pedal edema PSYCH: alert & oriented x 3 with fluent speech NEURO: no focal motor/sensory deficits    LABORATORY DATA:  I have reviewed the data as listed  . CBC Latest Ref Rng & Units 09/06/2017 08/21/2017 07/31/2017  WBC 4.0 - 10.3 K/uL 5.3 5.9 6.1  Hemoglobin 13.0 - 17.1 g/dL 10.3(L) 8.7(L) 9.6(L)  Hematocrit 38.4 - 49.9 % 32.5(L) 27.8(L) 30.4(L)  Platelets 140 - 400 K/uL 129(L) 143 233   . CBC    Component Value Date/Time   WBC 5.3 09/06/2017 1131   WBC 6.1 07/31/2017 0917   RBC 3.36 (L) 09/06/2017 1131   RBC 3.36 (L) 09/06/2017 1131   HGB 10.3 (L) 09/06/2017 1131   HCT 32.5 (L) 09/06/2017 1131   HCT 21.9 (L) 07/20/2017 2103   PLT 129 (L) 09/06/2017 1131   MCV 96.7 09/06/2017 1131   MCH 30.7 09/06/2017 1131   MCHC 31.7 (L) 09/06/2017 1131   RDW 22.5 (H) 09/06/2017 1131   LYMPHSABS 1.7 09/06/2017 1131   MONOABS 0.5 09/06/2017 1131   EOSABS 0.1 09/06/2017 1131   BASOSABS 0.0 09/06/2017 1131     . CMP Latest Ref Rng & Units 09/06/2017 08/21/2017 07/31/2017  Glucose 70 - 140 mg/dL 104 105 115(H)  BUN 7 - 26 mg/dL 29(H) 25 28(H)  Creatinine 0.70 - 1.30 mg/dL 2.00(H) 2.00(H) 1.99(H)  Sodium 136 - 145 mmol/L 138 137 137  Potassium 3.5 - 5.1 mmol/L 4.9 4.6 4.9  Chloride 98 - 109 mmol/L 111(H) 106 105  CO2 22 - 29 mmol/L 22 20(L) 24  Calcium 8.4 - 10.4 mg/dL 6.8(L) 7.2(L) 8.8(L)  Total Protein 6.4 - 8.3 g/dL 6.9 7.0 -  Total Bilirubin 0.2 - 1.2 mg/dL 0.6 0.6 -  Alkaline Phos 40 - 150 U/L 480(H) 547(H) -  AST 5 - 34 U/L 16 17 -  ALT 0 - 55 U/L 12 10 -   Component     Latest Ref Rng & Units 07/30/2017 09/06/2017  Prostate Specific Ag, Serum     0.0 -  4.0 ng/mL 1,186.0 (H) 433.8 (H)    Component     Latest Ref Rng & Units 07/30/2017  IgG (Immunoglobin G), Serum     700 - 1,600 mg/dL 1,060  IgA     61 - 437 mg/dL 140  IgM (Immunoglobulin M), Srm     15 - 143 mg/dL 34  Total Protein ELP     6.0 - 8.5 g/dL 6.4  Albumin SerPl Elph-Mcnc     2.9 - 4.4 g/dL 3.3  Alpha 1     0.0 - 0.4 g/dL 0.3  Alpha2 Glob SerPl Elph-Mcnc     0.4 - 1.0 g/dL 1.0  B-Globulin SerPl Elph-Mcnc     0.7 - 1.3 g/dL 0.9  Gamma Glob SerPl Elph-Mcnc     0.4 - 1.8 g/dL 0.9  M Protein SerPl Elph-Mcnc     Not Observed g/dL Not Observed  Globulin, Total     2.2 -  3.9 g/dL 3.1  Albumin/Glob SerPl     0.7 - 1.7 1.1  IFE 1      Comment  Please Note (HCV):      Comment  Kappa free light chain     3.3 - 19.4 mg/L 41.6 (H)  Lamda free light chains     5.7 - 26.3 mg/L 29.6 (H)  Kappa, lamda light chain ratio     0.26 - 1.65 1.41  Retic Ct Pct     0.8 - 1.8 % 2.1 (H)  RBC.     4.20 - 5.82 MIL/uL 3.23 (L)  Retic Count, Absolute     34.8 - 93.9 K/uL 67.8  Sed Rate     0 - 16 mm/hr 98 (H)  LDH     125 - 245 U/L 307 (H)  Prostate Specific Ag, Serum     0.0 - 4.0 ng/mL 1,186.0 (H)   07/31/17 BM Bx:   RADIOGRAPHIC STUDIES: I have personally reviewed the radiological images as listed and agreed with the findings in the report. Ct Chest Wo Contrast  Result Date: 08/08/2017 CLINICAL DATA:  Newly diagnosed prostate cancer.  Metastatic workup. EXAM: CT CHEST WITHOUT CONTRAST TECHNIQUE: Multidetector CT imaging of the chest was performed following the standard protocol without IV contrast. COMPARISON:  CT abdomen/pelvis 08/06/2017 FINDINGS: Cardiovascular: Moderate cardiac enlargement. No pericardial effusion. Scattered aortic calcifications but no focal aneurysm. Coronary artery calcifications are noted. Mildly enlarged pulmonary artery suggesting pulmonary hypertension. Mediastinum/Nodes: Small scattered mediastinal and hilar lymph nodes but no mass or overt  adenopathy. The esophagus is grossly normal. Calcified right infrahilar lymph nodes are noted along with a calcified granuloma at the right lung base. Lungs/Pleura: No worrisome pulmonary lesions to suggest pulmonary metastatic disease. A calcified granuloma is noted at the right lung base. A few tiny scattered subpleural nodules are likely lymph nodes. No acute pulmonary findings. No pleural effusion. Upper Abdomen: 11 mm retrocrural lymph node is noted on the right side. No other upper abdominal lymphadenopathy. Musculoskeletal: Diffuse sclerotic metastatic bone disease involving the spine, ribs and sternum. No obvious pathologic fracture or spinal canal compromise. Evidence of remote trauma involving the right clavicle. 13.5 mm left supraclavicular lymph node worrisome for metastatic adenopathy. No axillary adenopathy. IMPRESSION: 1. No CT findings for pulmonary metastatic disease. 2. Enlarged left supraclavicular node worrisome for metastatic disease. 3. Enlarged retrocrural lymph node noted. 4. Diffuse sclerotic osseous metastatic disease. Aortic Atherosclerosis (ICD10-I70.0). Electronically Signed   By: Marijo Sanes M.D.   On: 08/08/2017 16:44   Nm Bone Scan Whole Body  Result Date: 08/13/2017 CLINICAL DATA:  Prostate cancer. EXAM: NUCLEAR MEDICINE WHOLE BODY BONE SCAN TECHNIQUE: Whole body anterior and posterior images were obtained approximately 3 hours after intravenous injection of radiopharmaceutical. RADIOPHARMACEUTICALS:  21.6 mCi Technetium-55mMDP IV COMPARISON:  CT 08/06/2016. FINDINGS: There is diffuse abnormal increased radiotracer activity within the axial and proximal appendicular skeleton compatible with widespread metastatic disease. Patchy areas of increased uptake within the shafts of both humeri and femurs noted are compatible with areas of metastasis. Diminished physiologic soft tissue and renal activity compatible with super scan. IMPRESSION: 1. Imaging findings compatible with  widespread osseous metastatic disease involving the axial and proximal appendicular skeleton. Electronically Signed   By: TKerby MoorsM.D.   On: 08/13/2017 16:22    ASSESSMENT & PLAN:   84y.o. male with  1. Normocytic Anemia - due to primarily metastatic prostatic cancer (ACD + BM involvement with prostate cancer causing myelopthisic picture) -  Discussed patient's most recent labs, Hgb at 8.7 on 07/22/17. Now improved to 10.3. Did receive PRBC transfusion. -In review of the patient's CBC records, his anemia has developed in the last 6-7 months, unaccompanied by a drop in his EPO (07/20/17 elevated at 249.8);  LDH elevated  but haptoglobin at 357 on 07/20/17. -suggested against active hemolysis. Significantly increased sed rate. Myeloma panel - no overt evidence of plasma cell dyscrasia. He has CKD but elevated EPO suggests against this being the primary etiology of his anemia. -he noted some urinary symptoms so a PSA was done which is not noted to be significantly elevated - concerning for prostate cancer -Bone marrow biopsy- showed prominent involvement of the BM with metastatic carcinoma consistent with prostatic primary - causing Myelopthisic picture. -treatment will need to be directed towards his metastatic prostate cancer - recently diagnosed.  2. Recently diagnosed metastatic prostate cancer with extensive bone metastases. With bone mets/BM Mets and LNadenopathy. Exam positive for left retroperitoneal, bilateral pelvis and right inguinal adenopathy. Exam positive for left retroperitoneal, bilateral pelvis and right inguinal adenopathy. Diffuse sclerotic bone metastasis. Plan: -Discussed pt labwork today, 09/06/17; improved anemia with Hgb increased to 10.3 after transfusion on 09/03/17.  -PSA levels are down from 1186 to 433 in 1 month -Discussed most recent NM Whole Body Bone scan which revealed findings compatible with widespread osseous metastatic disease involving the axial and  proximal appendicular skeleton.   -Pt meets criteria for large volume disease -Discussed adding Abiraterone+prednisone given that pt is already tolerating Lupron well. Will hold for now and monitor for response with lupron.  -Will refer pt to nutritional therapist.  -Hold off on amlodipine because BP is running low today.  -Begin taking Flomax once a day from twice a day -Recommend that the pt urinate on a 2 hour schedule to help bladder incontinence.  -Increase PO non-alcoholic, non-caffeinated, non-carbonated hydration  -Recommend that BP medications be discussed again at cardiology appt on 09/27/17 -Lupron every 3 months dosing from today -Will see pt back in 4 weeks unless new concerns develop -Continue Xgeva once a month.     3. Elevated alkaline phosphatase due to bone metastases from prostate cancer -CT BM bx -concerning for significant bone lesions in lower spine and pelvis PSA levels 1186 now down to 433  4. Thigh Pain and back pain -Will need XR of BLE for evaluation of high-risk lesions and may need orthopedic intervention. - XR reviewed no overt concerning lesions noted -If no high-risk lesions are found, will refer to PT.  -Adding Senna S, Miralax, and magnesium citrate as needed -Low dose Dexamethasone for 2 weeks for bone pain and appetite boost  -Will refer evaluation from home health services for hospital bed and specialty mattress, Wheelchair , bedside commode, walker--home safety evaluation and PT -Will prescribe Oxycodone 48m q4 prn for leg pain with bowel prophylaxis  -Regarding the patient's anemia, we will set up transfusions prn for hgb<8 -no significant toxicity from lupron - will switch to q374month depot preparation. -Vitamin D and Calcium supplements ordered. -Xray b/l femurs today showed no overt measure high risk for fracture lesions. -X[place]inue q4weeks -[facility] referral for PT, RN home safety evaluation, DME -Nutritional therapy referral to  Ba[facility] Dr KaIrene Limbon 4 weeks with labs   5. High risk for decubitus ulcers and having difficult with thigh pains that limit mobility. -hospital bed -limited ambulation -  All of the patients questions were answered with apparent satisfaction. The patient  knows to call the clinic with any problems, questions or concerns.  . The total time spent in the appointment was 40 minutes and more than 50% was on counseling and direct patient cares.       Sullivan Lone MD [place] AAHIVMS [facility] Hematology/Oncology Physician [facility]  (Office):       [phone] (Work cell):  [phone] (Fax):           [phone]  09/06/2017 12:47 PM  This document serves as a record of services personally performed by Sullivan Lone, MD. It was created on his behalf by Baldwin Jamaica, a trained medical scribe. The creation of this record is based on the scribe's personal observations and the provider's statements to them.   .I have reviewed the above documentation for accuracy and completeness, and I agree with the above. Brunetta Genera MD

## 2017-09-06 ENCOUNTER — Inpatient Hospital Stay: Payer: Medicare Other

## 2017-09-06 ENCOUNTER — Telehealth: Payer: Self-pay

## 2017-09-06 ENCOUNTER — Encounter: Payer: Self-pay | Admitting: Hematology

## 2017-09-06 ENCOUNTER — Ambulatory Visit (HOSPITAL_COMMUNITY)
Admission: RE | Admit: 2017-09-06 | Discharge: 2017-09-06 | Disposition: A | Discharge status: Home / Self Care | Payer: Medicare Other | Source: Ambulatory Visit | Attending: Hematology | Admitting: Hematology

## 2017-09-06 ENCOUNTER — Inpatient Hospital Stay: Payer: Medicare Other | Attending: Hematology | Admitting: Hematology

## 2017-09-06 VITALS — BP 116/61 | HR 80 | Temp 98.1°F | Resp 19 | Ht 67.0 in | Wt 185.9 lb

## 2017-09-06 DIAGNOSIS — C61 Malignant neoplasm of prostate: Secondary | ICD-10-CM

## 2017-09-06 DIAGNOSIS — M1611 Unilateral primary osteoarthritis, right hip: Secondary | ICD-10-CM | POA: Diagnosis not present

## 2017-09-06 DIAGNOSIS — D6182 Myelophthisis: Secondary | ICD-10-CM

## 2017-09-06 DIAGNOSIS — C7951 Secondary malignant neoplasm of bone: Secondary | ICD-10-CM

## 2017-09-06 DIAGNOSIS — R634 Abnormal weight loss: Secondary | ICD-10-CM | POA: Diagnosis not present

## 2017-09-06 DIAGNOSIS — G893 Neoplasm related pain (acute) (chronic): Secondary | ICD-10-CM | POA: Insufficient documentation

## 2017-09-06 DIAGNOSIS — Z79891 Long term (current) use of opiate analgesic: Secondary | ICD-10-CM

## 2017-09-06 DIAGNOSIS — Z87891 Personal history of nicotine dependence: Secondary | ICD-10-CM | POA: Insufficient documentation

## 2017-09-06 DIAGNOSIS — Z79818 Long term (current) use of other agents affecting estrogen receptors and estrogen levels: Secondary | ICD-10-CM | POA: Insufficient documentation

## 2017-09-06 DIAGNOSIS — C801 Malignant (primary) neoplasm, unspecified: Secondary | ICD-10-CM | POA: Diagnosis not present

## 2017-09-06 LAB — RETICULOCYTES
RBC.: 3.36 MIL/uL — AB (ref 4.20–5.82)
Retic Count, Absolute: 70.6 10*3/uL (ref 34.8–93.9)
Retic Ct Pct: 2.1 % — ABNORMAL HIGH (ref 0.8–1.8)

## 2017-09-06 LAB — CMP (CANCER CENTER ONLY)
ALBUMIN: 3.5 g/dL (ref 3.5–5.0)
ALT: 12 U/L (ref 0–55)
AST: 16 U/L (ref 5–34)
Alkaline Phosphatase: 480 U/L — ABNORMAL HIGH (ref 40–150)
Anion gap: 5 (ref 3–11)
BUN: 29 mg/dL — AB (ref 7–26)
CO2: 22 mmol/L (ref 22–29)
CREATININE: 2 mg/dL — AB (ref 0.70–1.30)
Calcium: 6.8 mg/dL — ABNORMAL LOW (ref 8.4–10.4)
Chloride: 111 mmol/L — ABNORMAL HIGH (ref 98–109)
GFR, EST NON AFRICAN AMERICAN: 29 mL/min — AB (ref >60)
GFR, Est AFR Am: 33 mL/min — ABNORMAL LOW (ref >60)
GLUCOSE: 104 mg/dL (ref 70–140)
POTASSIUM: 4.9 mmol/L (ref 3.5–5.1)
Sodium: 138 mmol/L (ref 136–145)
Total Bilirubin: 0.6 mg/dL (ref 0.2–1.2)
Total Protein: 6.9 g/dL (ref 6.4–8.3)

## 2017-09-06 LAB — CBC WITH DIFFERENTIAL (CANCER CENTER ONLY)
Basophils Absolute: 0 10*3/uL (ref 0.0–0.1)
Basophils Relative: 1 %
EOS PCT: 1 %
Eosinophils Absolute: 0.1 10*3/uL (ref 0.0–0.5)
HEMATOCRIT: 32.5 % — AB (ref 38.4–49.9)
HEMOGLOBIN: 10.3 g/dL — AB (ref 13.0–17.1)
LYMPHS ABS: 1.7 10*3/uL (ref 0.9–3.3)
LYMPHS PCT: 33 %
MCH: 30.7 pg (ref 27.2–33.4)
MCHC: 31.7 g/dL — AB (ref 32.0–36.0)
MCV: 96.7 fL (ref 79.3–98.0)
MONO ABS: 0.5 10*3/uL (ref 0.1–0.9)
MONOS PCT: 9 %
NEUTROS ABS: 3 10*3/uL (ref 1.5–6.5)
Neutrophils Relative %: 56 %
Platelet Count: 129 10*3/uL — ABNORMAL LOW (ref 140–400)
RBC: 3.36 MIL/uL — ABNORMAL LOW (ref 4.20–5.82)
RDW: 22.5 % — AB (ref 11.0–14.6)
WBC: 5.3 10*3/uL (ref 4.0–10.3)
nRBC: 3 /100 WBC — ABNORMAL HIGH

## 2017-09-06 LAB — SAMPLE TO BLOOD BANK

## 2017-09-06 MED ORDER — CALCIUM CARBONATE ANTACID 500 MG PO CHEW
4.0000 | CHEWABLE_TABLET | Freq: Once | ORAL | Status: AC
  Administered 2017-09-06: 800 mg via ORAL
  Filled 2017-09-06: qty 4

## 2017-09-06 MED ORDER — LEUPROLIDE ACETATE (3 MONTH) 22.5 MG IM KIT
PACK | INTRAMUSCULAR | Status: AC
  Filled 2017-09-06: qty 22.5

## 2017-09-06 MED ORDER — OXYCODONE HCL 5 MG PO TABS: 5.0000 mg | ORAL_TABLET | ORAL | 0 refills | Status: DC | PRN

## 2017-09-06 MED ORDER — SENNOSIDES-DOCUSATE SODIUM 8.6-50 MG PO TABS: 2.0000 | ORAL_TABLET | Freq: Two times a day (BID) | ORAL | 2 refills | Status: DC

## 2017-09-06 MED ORDER — DEXAMETHASONE 2 MG PO TABS: 2.0000 mg | ORAL_TABLET | Freq: Every day | ORAL | 0 refills | Status: DC

## 2017-09-06 MED ORDER — POLYETHYLENE GLYCOL 3350 17 G PO PACK: 17.0000 g | PACK | Freq: Every day | ORAL | 1 refills | Status: AC

## 2017-09-06 MED ORDER — DENOSUMAB 120 MG/1.7ML ~~LOC~~ SOLN
SUBCUTANEOUS | Status: AC
  Filled 2017-09-06: qty 1.7

## 2017-09-06 MED ORDER — LEUPROLIDE ACETATE (3 MONTH) 22.5 MG IM KIT
22.5000 mg | PACK | Freq: Once | INTRAMUSCULAR | Status: AC
  Administered 2017-09-06: 22.5 mg via INTRAMUSCULAR

## 2017-09-06 MED ORDER — DENOSUMAB 120 MG/1.7ML ~~LOC~~ SOLN
120.0000 mg | Freq: Once | SUBCUTANEOUS | Status: AC
  Administered 2017-09-06: 120 mg via SUBCUTANEOUS

## 2017-09-06 NOTE — Progress Notes (Signed)
09/06/17 @ [place] to report to Dr [name] patients corrected Calcium of 7.2 and dose of Xgeva today.  Received a call from [employer] on behalf of Dr [name] to proceed with dose of Xgeva today and give patient 4 x calcium carbonate 500 mg today in clinic.  I recommended to RN to ask if home dose of Calcium could be increased to 2 a day.  Proceeded to dispense calcium and xgeva to the [facility].    T.O. Dr Marjory Sneddon RN/Millie Shorb Ronnald Ramp, PharmD

## 2017-09-06 NOTE — Progress Notes (Signed)
Per DR [name], [place] to give lupron today with the 3 mo dose.   Spoke with Arbie Cookey in pharmacy & verified dose with her.   She reports that pt's corrected calcium is 7.2 & pt should not get xgeva today.  Will discuss with Dr [name].  Per Dr [name], [place] to give [facility] with extra calcium today.

## 2017-09-06 NOTE — Telephone Encounter (Signed)
Printed avs and 2 copies of the calender per 5/9 los

## 2017-09-06 NOTE — Patient Instructions (Addendum)
Thank you for choosing [facility] to provide your oncology and hematology care.  To afford each patient quality time with our providers, please arrive 30 minutes before your scheduled appointment time.  If you arrive late for your appointment, you may be asked to reschedule.  We strive to give you quality time with our providers, and arriving late affects you and other patients whose appointments are after yours.   If you are a no show for multiple scheduled visits, you may be dismissed from the clinic at the providers discretion.    Again, thank you for choosing [facility], our hope is that these requests will decrease the amount of time that you wait before being seen by our physicians.  ______________________________________________________________________  Should you have questions after your visit to the [facility], please contact our office at [phone] between the hours of 8:30 and 4:30 p.m.    Voicemails left after 4:30p.m will not be returned until the following business day.    For prescription refill requests, please have your pharmacy contact us directly.  Please also try to allow 48 hours for prescription requests.    Please contact the scheduling department for questions regarding scheduling.  For scheduling of procedures such as PET scans, CT scans, MRI, Ultrasound, etc please contact central scheduling at [phone].    Resources For Cancer Patients and Caregivers:   Oncolink.org:  A wonderful resource for patients and healthcare providers for information regarding your disease, ways to tract your treatment, what to expect, etc.     American Cancer Society:  800-227-2345  Can help patients locate various types of support and financial assistance  Cancer Care: 1-800-813-HOPE (4673) Provides financial assistance, online support groups, medication/co-pay assistance.    [place] DSS:  336-641-3447 Where to apply for food  stamps, Medicaid, and utility assistance  Medicare Rights Center: 800-333-4114 Helps people with Medicare understand their rights and benefits, navigate the Medicare system, and secure the quality healthcare they deserve  SCAT: 336-333-6589 [place] [employer]'s shared-ride transportation service for eligible riders who have a disability that prevents them from riding the fixed route bus.    For additional information on assistance programs please contact our social worker:   Grier Hock/Abigail Elmore:  [phone]            

## 2017-09-06 NOTE — Progress Notes (Signed)
Per Dr. [name], home health referral made to [facility] for home safety check, PT, possible nursing needs.  SW Santiago Glad @ [facility] for referral.    Per Janifer Adie, RN pharmacist Henreitta Leber, RPh stated corrected calcium is not adequate to receive Xgeva today.  Per Dr. [name], due to multiple pathological bone fractures, patient needs Delton See despite calcium levels.  Reviewed with pharmacist.  Tums given to patient to correct calcium.  Patient advised to increase calcium intake at home.  Pt verbalized understanding.

## 2017-09-07 DIAGNOSIS — N189 Chronic kidney disease, unspecified: Secondary | ICD-10-CM | POA: Diagnosis not present

## 2017-09-07 DIAGNOSIS — R350 Frequency of micturition: Secondary | ICD-10-CM | POA: Diagnosis not present

## 2017-09-07 DIAGNOSIS — D509 Iron deficiency anemia, unspecified: Secondary | ICD-10-CM | POA: Diagnosis not present

## 2017-09-07 DIAGNOSIS — D539 Nutritional anemia, unspecified: Secondary | ICD-10-CM | POA: Diagnosis not present

## 2017-09-07 DIAGNOSIS — M109 Gout, unspecified: Secondary | ICD-10-CM | POA: Diagnosis not present

## 2017-09-07 DIAGNOSIS — Z955 Presence of coronary angioplasty implant and graft: Secondary | ICD-10-CM | POA: Diagnosis not present

## 2017-09-07 DIAGNOSIS — C7951 Secondary malignant neoplasm of bone: Secondary | ICD-10-CM | POA: Diagnosis not present

## 2017-09-07 DIAGNOSIS — I129 Hypertensive chronic kidney disease with stage 1 through stage 4 chronic kidney disease, or unspecified chronic kidney disease: Secondary | ICD-10-CM | POA: Diagnosis not present

## 2017-09-07 DIAGNOSIS — G893 Neoplasm related pain (acute) (chronic): Secondary | ICD-10-CM | POA: Diagnosis not present

## 2017-09-07 DIAGNOSIS — N401 Enlarged prostate with lower urinary tract symptoms: Secondary | ICD-10-CM | POA: Diagnosis not present

## 2017-09-07 DIAGNOSIS — I700 Atherosclerosis of aorta: Secondary | ICD-10-CM | POA: Diagnosis not present

## 2017-09-07 DIAGNOSIS — D6182 Myelophthisis: Secondary | ICD-10-CM | POA: Diagnosis not present

## 2017-09-07 DIAGNOSIS — D630 Anemia in neoplastic disease: Secondary | ICD-10-CM | POA: Diagnosis not present

## 2017-09-07 DIAGNOSIS — Z7902 Long term (current) use of antithrombotics/antiplatelets: Secondary | ICD-10-CM | POA: Diagnosis not present

## 2017-09-07 DIAGNOSIS — C7952 Secondary malignant neoplasm of bone marrow: Secondary | ICD-10-CM | POA: Diagnosis not present

## 2017-09-07 DIAGNOSIS — E785 Hyperlipidemia, unspecified: Secondary | ICD-10-CM | POA: Diagnosis not present

## 2017-09-07 DIAGNOSIS — I252 Old myocardial infarction: Secondary | ICD-10-CM | POA: Diagnosis not present

## 2017-09-07 DIAGNOSIS — Z8673 Personal history of transient ischemic attack (TIA), and cerebral infarction without residual deficits: Secondary | ICD-10-CM | POA: Diagnosis not present

## 2017-09-07 DIAGNOSIS — Z79891 Long term (current) use of opiate analgesic: Secondary | ICD-10-CM | POA: Diagnosis not present

## 2017-09-07 DIAGNOSIS — C61 Malignant neoplasm of prostate: Secondary | ICD-10-CM | POA: Diagnosis not present

## 2017-09-07 DIAGNOSIS — R590 Localized enlarged lymph nodes: Secondary | ICD-10-CM | POA: Diagnosis not present

## 2017-09-07 DIAGNOSIS — I25119 Atherosclerotic heart disease of native coronary artery with unspecified angina pectoris: Secondary | ICD-10-CM | POA: Diagnosis not present

## 2017-09-07 DIAGNOSIS — R3915 Urgency of urination: Secondary | ICD-10-CM | POA: Diagnosis not present

## 2017-09-07 LAB — TESTOSTERONE: Testosterone: 8 ng/dL — ABNORMAL LOW (ref 264–916)

## 2017-09-07 LAB — VITAMIN D 25 HYDROXY (VIT D DEFICIENCY, FRACTURES): Vit D, 25-Hydroxy: 39.6 ng/mL (ref 30.0–100.0)

## 2017-09-07 LAB — PROSTATE-SPECIFIC AG, SERUM (LABCORP): Prostate Specific Ag, Serum: 433.8 ng/mL — ABNORMAL HIGH (ref 0.0–4.0)

## 2017-09-11 DIAGNOSIS — G893 Neoplasm related pain (acute) (chronic): Secondary | ICD-10-CM | POA: Diagnosis not present

## 2017-09-11 DIAGNOSIS — Z8673 Personal history of transient ischemic attack (TIA), and cerebral infarction without residual deficits: Secondary | ICD-10-CM | POA: Diagnosis not present

## 2017-09-11 DIAGNOSIS — I700 Atherosclerosis of aorta: Secondary | ICD-10-CM | POA: Diagnosis not present

## 2017-09-11 DIAGNOSIS — C7951 Secondary malignant neoplasm of bone: Secondary | ICD-10-CM | POA: Diagnosis not present

## 2017-09-11 DIAGNOSIS — N401 Enlarged prostate with lower urinary tract symptoms: Secondary | ICD-10-CM | POA: Diagnosis not present

## 2017-09-11 DIAGNOSIS — D630 Anemia in neoplastic disease: Secondary | ICD-10-CM | POA: Diagnosis not present

## 2017-09-11 DIAGNOSIS — D509 Iron deficiency anemia, unspecified: Secondary | ICD-10-CM | POA: Diagnosis not present

## 2017-09-11 DIAGNOSIS — I25119 Atherosclerotic heart disease of native coronary artery with unspecified angina pectoris: Secondary | ICD-10-CM | POA: Diagnosis not present

## 2017-09-11 DIAGNOSIS — D6182 Myelophthisis: Secondary | ICD-10-CM | POA: Diagnosis not present

## 2017-09-11 DIAGNOSIS — N189 Chronic kidney disease, unspecified: Secondary | ICD-10-CM | POA: Diagnosis not present

## 2017-09-11 DIAGNOSIS — I252 Old myocardial infarction: Secondary | ICD-10-CM | POA: Diagnosis not present

## 2017-09-11 DIAGNOSIS — C61 Malignant neoplasm of prostate: Secondary | ICD-10-CM | POA: Diagnosis not present

## 2017-09-11 DIAGNOSIS — R350 Frequency of micturition: Secondary | ICD-10-CM | POA: Diagnosis not present

## 2017-09-11 DIAGNOSIS — M109 Gout, unspecified: Secondary | ICD-10-CM | POA: Diagnosis not present

## 2017-09-11 DIAGNOSIS — C7952 Secondary malignant neoplasm of bone marrow: Secondary | ICD-10-CM | POA: Diagnosis not present

## 2017-09-11 DIAGNOSIS — R3915 Urgency of urination: Secondary | ICD-10-CM | POA: Diagnosis not present

## 2017-09-11 DIAGNOSIS — Z955 Presence of coronary angioplasty implant and graft: Secondary | ICD-10-CM | POA: Diagnosis not present

## 2017-09-11 DIAGNOSIS — E785 Hyperlipidemia, unspecified: Secondary | ICD-10-CM | POA: Diagnosis not present

## 2017-09-11 DIAGNOSIS — Z7902 Long term (current) use of antithrombotics/antiplatelets: Secondary | ICD-10-CM | POA: Diagnosis not present

## 2017-09-11 DIAGNOSIS — I129 Hypertensive chronic kidney disease with stage 1 through stage 4 chronic kidney disease, or unspecified chronic kidney disease: Secondary | ICD-10-CM | POA: Diagnosis not present

## 2017-09-11 DIAGNOSIS — D539 Nutritional anemia, unspecified: Secondary | ICD-10-CM | POA: Diagnosis not present

## 2017-09-11 DIAGNOSIS — Z79891 Long term (current) use of opiate analgesic: Secondary | ICD-10-CM | POA: Diagnosis not present

## 2017-09-11 DIAGNOSIS — R590 Localized enlarged lymph nodes: Secondary | ICD-10-CM | POA: Diagnosis not present

## 2017-09-12 ENCOUNTER — Inpatient Hospital Stay: Payer: Medicare Other | Admitting: Nutrition

## 2017-09-12 DIAGNOSIS — M109 Gout, unspecified: Secondary | ICD-10-CM | POA: Diagnosis not present

## 2017-09-12 DIAGNOSIS — G893 Neoplasm related pain (acute) (chronic): Secondary | ICD-10-CM | POA: Diagnosis not present

## 2017-09-12 DIAGNOSIS — C7952 Secondary malignant neoplasm of bone marrow: Secondary | ICD-10-CM | POA: Diagnosis not present

## 2017-09-12 DIAGNOSIS — N401 Enlarged prostate with lower urinary tract symptoms: Secondary | ICD-10-CM | POA: Diagnosis not present

## 2017-09-12 DIAGNOSIS — I25119 Atherosclerotic heart disease of native coronary artery with unspecified angina pectoris: Secondary | ICD-10-CM | POA: Diagnosis not present

## 2017-09-12 DIAGNOSIS — R590 Localized enlarged lymph nodes: Secondary | ICD-10-CM | POA: Diagnosis not present

## 2017-09-12 DIAGNOSIS — C7951 Secondary malignant neoplasm of bone: Secondary | ICD-10-CM | POA: Diagnosis not present

## 2017-09-12 DIAGNOSIS — N189 Chronic kidney disease, unspecified: Secondary | ICD-10-CM | POA: Diagnosis not present

## 2017-09-12 DIAGNOSIS — D539 Nutritional anemia, unspecified: Secondary | ICD-10-CM | POA: Diagnosis not present

## 2017-09-12 DIAGNOSIS — I129 Hypertensive chronic kidney disease with stage 1 through stage 4 chronic kidney disease, or unspecified chronic kidney disease: Secondary | ICD-10-CM | POA: Diagnosis not present

## 2017-09-12 DIAGNOSIS — C61 Malignant neoplasm of prostate: Secondary | ICD-10-CM | POA: Diagnosis not present

## 2017-09-12 DIAGNOSIS — D509 Iron deficiency anemia, unspecified: Secondary | ICD-10-CM | POA: Diagnosis not present

## 2017-09-12 DIAGNOSIS — I252 Old myocardial infarction: Secondary | ICD-10-CM | POA: Diagnosis not present

## 2017-09-12 DIAGNOSIS — E785 Hyperlipidemia, unspecified: Secondary | ICD-10-CM | POA: Diagnosis not present

## 2017-09-12 DIAGNOSIS — Z8673 Personal history of transient ischemic attack (TIA), and cerebral infarction without residual deficits: Secondary | ICD-10-CM | POA: Diagnosis not present

## 2017-09-12 DIAGNOSIS — Z7902 Long term (current) use of antithrombotics/antiplatelets: Secondary | ICD-10-CM | POA: Diagnosis not present

## 2017-09-12 DIAGNOSIS — R350 Frequency of micturition: Secondary | ICD-10-CM | POA: Diagnosis not present

## 2017-09-12 DIAGNOSIS — D630 Anemia in neoplastic disease: Secondary | ICD-10-CM | POA: Diagnosis not present

## 2017-09-12 DIAGNOSIS — Z955 Presence of coronary angioplasty implant and graft: Secondary | ICD-10-CM | POA: Diagnosis not present

## 2017-09-12 DIAGNOSIS — R3915 Urgency of urination: Secondary | ICD-10-CM | POA: Diagnosis not present

## 2017-09-12 DIAGNOSIS — Z79891 Long term (current) use of opiate analgesic: Secondary | ICD-10-CM | POA: Diagnosis not present

## 2017-09-12 DIAGNOSIS — I700 Atherosclerosis of aorta: Secondary | ICD-10-CM | POA: Diagnosis not present

## 2017-09-12 DIAGNOSIS — D6182 Myelophthisis: Secondary | ICD-10-CM | POA: Diagnosis not present

## 2017-09-12 NOTE — Progress Notes (Signed)
84 year old male diagnosed with metastatic prostate cancer and anemia.  Past medical history includes BPH, CAD, stroke, hyperlipidemia, and hypertension.  Medications include calcium with vitamin D, Decadron, multivitamin, omega-3 fatty acids, MiraLAX, Senokot, and vitamin B12.  Labs include BUN 29 and creatinine 2.0.  Height: 5 feet 7 inches. Weight: 185.9 pounds May 9. Usual body weight: 194 pounds in March 2019.  206.8 pounds July 2018. BMI: 29.12.  Patient denies nausea vomiting and diarrhea. He denies problems chewing or swallowing problems. He does report constipation but has been given medication for this. Patient typically only eats one meal daily. He will drink oral nutrition supplements but has been focusing on high-protein supplements instead of high-calorie, high-protein supplements. Patient does not like the taste of water but will drink other liquids. Daughter requesting information on calcium-containing foods.  Nutrition diagnosis: Food and nutrition related knowledge deficit related to prostate cancer and associated treatments as evidenced by no prior need for nutrition related information.  Intervention: Patient was educated to increase high-calorie, high-protein foods in 6 small meals and snacks daily. Educated patient to increase oral nutrition supplements 3 times daily between meals. Provided many different samples of oral nutrition supplements for patient to try along with coupons for purchase. Reviewed strategies for increasing liquid intake. Provided fact sheets on high-calorie high-protein foods, constipation, high calcium foods, and poor appetite. Questions were answered.  Teach back method used.  Contact information given.  Monitoring, evaluation, goals: Patient will tolerate increased calories and protein to minimize weight loss.  Next visit: To be scheduled as needed.  **Disclaimer: This note was dictated with voice recognition software. Similar  sounding words can inadvertently be transcribed and this note may contain transcription errors which may not have been corrected upon publication of note.**

## 2017-09-13 ENCOUNTER — Telehealth: Payer: Self-pay

## 2017-09-13 DIAGNOSIS — R269 Unspecified abnormalities of gait and mobility: Secondary | ICD-10-CM | POA: Diagnosis not present

## 2017-09-13 DIAGNOSIS — R0609 Other forms of dyspnea: Secondary | ICD-10-CM | POA: Diagnosis not present

## 2017-09-13 DIAGNOSIS — R0600 Dyspnea, unspecified: Secondary | ICD-10-CM | POA: Diagnosis not present

## 2017-09-13 DIAGNOSIS — G893 Neoplasm related pain (acute) (chronic): Secondary | ICD-10-CM | POA: Diagnosis not present

## 2017-09-13 DIAGNOSIS — D6182 Myelophthisis: Secondary | ICD-10-CM | POA: Diagnosis not present

## 2017-09-13 NOTE — Telephone Encounter (Signed)
Fax sent to [facility] for pt equipment. Orders signed and faxe to [phone]. Confirmed fax receipt 09/12/17 at 1726.

## 2017-09-14 ENCOUNTER — Telehealth: Payer: Self-pay

## 2017-09-14 DIAGNOSIS — C7951 Secondary malignant neoplasm of bone: Secondary | ICD-10-CM | POA: Diagnosis not present

## 2017-09-14 DIAGNOSIS — C61 Malignant neoplasm of prostate: Secondary | ICD-10-CM | POA: Diagnosis not present

## 2017-09-14 DIAGNOSIS — I700 Atherosclerosis of aorta: Secondary | ICD-10-CM | POA: Diagnosis not present

## 2017-09-14 DIAGNOSIS — I25119 Atherosclerotic heart disease of native coronary artery with unspecified angina pectoris: Secondary | ICD-10-CM | POA: Diagnosis not present

## 2017-09-14 DIAGNOSIS — N189 Chronic kidney disease, unspecified: Secondary | ICD-10-CM | POA: Diagnosis not present

## 2017-09-14 DIAGNOSIS — D509 Iron deficiency anemia, unspecified: Secondary | ICD-10-CM | POA: Diagnosis not present

## 2017-09-14 DIAGNOSIS — D630 Anemia in neoplastic disease: Secondary | ICD-10-CM | POA: Diagnosis not present

## 2017-09-14 DIAGNOSIS — Z955 Presence of coronary angioplasty implant and graft: Secondary | ICD-10-CM | POA: Diagnosis not present

## 2017-09-14 DIAGNOSIS — Z8673 Personal history of transient ischemic attack (TIA), and cerebral infarction without residual deficits: Secondary | ICD-10-CM | POA: Diagnosis not present

## 2017-09-14 DIAGNOSIS — I129 Hypertensive chronic kidney disease with stage 1 through stage 4 chronic kidney disease, or unspecified chronic kidney disease: Secondary | ICD-10-CM | POA: Diagnosis not present

## 2017-09-14 DIAGNOSIS — R3915 Urgency of urination: Secondary | ICD-10-CM | POA: Diagnosis not present

## 2017-09-14 DIAGNOSIS — I252 Old myocardial infarction: Secondary | ICD-10-CM | POA: Diagnosis not present

## 2017-09-14 DIAGNOSIS — N401 Enlarged prostate with lower urinary tract symptoms: Secondary | ICD-10-CM | POA: Diagnosis not present

## 2017-09-14 DIAGNOSIS — D6182 Myelophthisis: Secondary | ICD-10-CM | POA: Diagnosis not present

## 2017-09-14 DIAGNOSIS — E785 Hyperlipidemia, unspecified: Secondary | ICD-10-CM | POA: Diagnosis not present

## 2017-09-14 DIAGNOSIS — G893 Neoplasm related pain (acute) (chronic): Secondary | ICD-10-CM | POA: Diagnosis not present

## 2017-09-14 DIAGNOSIS — R590 Localized enlarged lymph nodes: Secondary | ICD-10-CM | POA: Diagnosis not present

## 2017-09-14 DIAGNOSIS — R350 Frequency of micturition: Secondary | ICD-10-CM | POA: Diagnosis not present

## 2017-09-14 DIAGNOSIS — C7952 Secondary malignant neoplasm of bone marrow: Secondary | ICD-10-CM | POA: Diagnosis not present

## 2017-09-14 DIAGNOSIS — M109 Gout, unspecified: Secondary | ICD-10-CM | POA: Diagnosis not present

## 2017-09-14 DIAGNOSIS — Z79891 Long term (current) use of opiate analgesic: Secondary | ICD-10-CM | POA: Diagnosis not present

## 2017-09-14 DIAGNOSIS — D539 Nutritional anemia, unspecified: Secondary | ICD-10-CM | POA: Diagnosis not present

## 2017-09-14 DIAGNOSIS — Z7902 Long term (current) use of antithrombotics/antiplatelets: Secondary | ICD-10-CM | POA: Diagnosis not present

## 2017-09-14 NOTE — Telephone Encounter (Signed)
Paperwork received for to explain pt need for special air mattress and wheelchair. Documents completed and signed by MD. Confirmed fax receipt to [phone] 09/14/17 at 1339.

## 2017-09-17 DIAGNOSIS — R350 Frequency of micturition: Secondary | ICD-10-CM | POA: Diagnosis not present

## 2017-09-17 DIAGNOSIS — Z955 Presence of coronary angioplasty implant and graft: Secondary | ICD-10-CM | POA: Diagnosis not present

## 2017-09-17 DIAGNOSIS — Z7902 Long term (current) use of antithrombotics/antiplatelets: Secondary | ICD-10-CM | POA: Diagnosis not present

## 2017-09-17 DIAGNOSIS — M109 Gout, unspecified: Secondary | ICD-10-CM | POA: Diagnosis not present

## 2017-09-17 DIAGNOSIS — I252 Old myocardial infarction: Secondary | ICD-10-CM | POA: Diagnosis not present

## 2017-09-17 DIAGNOSIS — R3915 Urgency of urination: Secondary | ICD-10-CM | POA: Diagnosis not present

## 2017-09-17 DIAGNOSIS — Z79891 Long term (current) use of opiate analgesic: Secondary | ICD-10-CM | POA: Diagnosis not present

## 2017-09-17 DIAGNOSIS — C61 Malignant neoplasm of prostate: Secondary | ICD-10-CM | POA: Diagnosis not present

## 2017-09-17 DIAGNOSIS — Z8673 Personal history of transient ischemic attack (TIA), and cerebral infarction without residual deficits: Secondary | ICD-10-CM | POA: Diagnosis not present

## 2017-09-17 DIAGNOSIS — C7951 Secondary malignant neoplasm of bone: Secondary | ICD-10-CM | POA: Diagnosis not present

## 2017-09-17 DIAGNOSIS — I700 Atherosclerosis of aorta: Secondary | ICD-10-CM | POA: Diagnosis not present

## 2017-09-17 DIAGNOSIS — R590 Localized enlarged lymph nodes: Secondary | ICD-10-CM | POA: Diagnosis not present

## 2017-09-17 DIAGNOSIS — D6182 Myelophthisis: Secondary | ICD-10-CM | POA: Diagnosis not present

## 2017-09-17 DIAGNOSIS — I25119 Atherosclerotic heart disease of native coronary artery with unspecified angina pectoris: Secondary | ICD-10-CM | POA: Diagnosis not present

## 2017-09-17 DIAGNOSIS — E785 Hyperlipidemia, unspecified: Secondary | ICD-10-CM | POA: Diagnosis not present

## 2017-09-17 DIAGNOSIS — N401 Enlarged prostate with lower urinary tract symptoms: Secondary | ICD-10-CM | POA: Diagnosis not present

## 2017-09-17 DIAGNOSIS — D539 Nutritional anemia, unspecified: Secondary | ICD-10-CM | POA: Diagnosis not present

## 2017-09-17 DIAGNOSIS — D630 Anemia in neoplastic disease: Secondary | ICD-10-CM | POA: Diagnosis not present

## 2017-09-17 DIAGNOSIS — R0609 Other forms of dyspnea: Secondary | ICD-10-CM | POA: Diagnosis not present

## 2017-09-17 DIAGNOSIS — R269 Unspecified abnormalities of gait and mobility: Secondary | ICD-10-CM | POA: Diagnosis not present

## 2017-09-17 DIAGNOSIS — N189 Chronic kidney disease, unspecified: Secondary | ICD-10-CM | POA: Diagnosis not present

## 2017-09-17 DIAGNOSIS — I129 Hypertensive chronic kidney disease with stage 1 through stage 4 chronic kidney disease, or unspecified chronic kidney disease: Secondary | ICD-10-CM | POA: Diagnosis not present

## 2017-09-17 DIAGNOSIS — C7952 Secondary malignant neoplasm of bone marrow: Secondary | ICD-10-CM | POA: Diagnosis not present

## 2017-09-17 DIAGNOSIS — G893 Neoplasm related pain (acute) (chronic): Secondary | ICD-10-CM | POA: Diagnosis not present

## 2017-09-17 DIAGNOSIS — D509 Iron deficiency anemia, unspecified: Secondary | ICD-10-CM | POA: Diagnosis not present

## 2017-09-18 DIAGNOSIS — D539 Nutritional anemia, unspecified: Secondary | ICD-10-CM | POA: Diagnosis not present

## 2017-09-18 DIAGNOSIS — Z7902 Long term (current) use of antithrombotics/antiplatelets: Secondary | ICD-10-CM | POA: Diagnosis not present

## 2017-09-18 DIAGNOSIS — R350 Frequency of micturition: Secondary | ICD-10-CM | POA: Diagnosis not present

## 2017-09-18 DIAGNOSIS — M109 Gout, unspecified: Secondary | ICD-10-CM | POA: Diagnosis not present

## 2017-09-18 DIAGNOSIS — D509 Iron deficiency anemia, unspecified: Secondary | ICD-10-CM | POA: Diagnosis not present

## 2017-09-18 DIAGNOSIS — R590 Localized enlarged lymph nodes: Secondary | ICD-10-CM | POA: Diagnosis not present

## 2017-09-18 DIAGNOSIS — N401 Enlarged prostate with lower urinary tract symptoms: Secondary | ICD-10-CM | POA: Diagnosis not present

## 2017-09-18 DIAGNOSIS — I129 Hypertensive chronic kidney disease with stage 1 through stage 4 chronic kidney disease, or unspecified chronic kidney disease: Secondary | ICD-10-CM | POA: Diagnosis not present

## 2017-09-18 DIAGNOSIS — Z8673 Personal history of transient ischemic attack (TIA), and cerebral infarction without residual deficits: Secondary | ICD-10-CM | POA: Diagnosis not present

## 2017-09-18 DIAGNOSIS — G893 Neoplasm related pain (acute) (chronic): Secondary | ICD-10-CM | POA: Diagnosis not present

## 2017-09-18 DIAGNOSIS — N189 Chronic kidney disease, unspecified: Secondary | ICD-10-CM | POA: Diagnosis not present

## 2017-09-18 DIAGNOSIS — I700 Atherosclerosis of aorta: Secondary | ICD-10-CM | POA: Diagnosis not present

## 2017-09-18 DIAGNOSIS — E785 Hyperlipidemia, unspecified: Secondary | ICD-10-CM | POA: Diagnosis not present

## 2017-09-18 DIAGNOSIS — C7952 Secondary malignant neoplasm of bone marrow: Secondary | ICD-10-CM | POA: Diagnosis not present

## 2017-09-18 DIAGNOSIS — D630 Anemia in neoplastic disease: Secondary | ICD-10-CM | POA: Diagnosis not present

## 2017-09-18 DIAGNOSIS — I25119 Atherosclerotic heart disease of native coronary artery with unspecified angina pectoris: Secondary | ICD-10-CM | POA: Diagnosis not present

## 2017-09-18 DIAGNOSIS — Z79891 Long term (current) use of opiate analgesic: Secondary | ICD-10-CM | POA: Diagnosis not present

## 2017-09-18 DIAGNOSIS — R3915 Urgency of urination: Secondary | ICD-10-CM | POA: Diagnosis not present

## 2017-09-18 DIAGNOSIS — C7951 Secondary malignant neoplasm of bone: Secondary | ICD-10-CM | POA: Diagnosis not present

## 2017-09-18 DIAGNOSIS — C61 Malignant neoplasm of prostate: Secondary | ICD-10-CM | POA: Diagnosis not present

## 2017-09-18 DIAGNOSIS — D6182 Myelophthisis: Secondary | ICD-10-CM | POA: Diagnosis not present

## 2017-09-18 DIAGNOSIS — I252 Old myocardial infarction: Secondary | ICD-10-CM | POA: Diagnosis not present

## 2017-09-18 DIAGNOSIS — Z955 Presence of coronary angioplasty implant and graft: Secondary | ICD-10-CM | POA: Diagnosis not present

## 2017-09-19 DIAGNOSIS — D539 Nutritional anemia, unspecified: Secondary | ICD-10-CM | POA: Diagnosis not present

## 2017-09-19 DIAGNOSIS — D6182 Myelophthisis: Secondary | ICD-10-CM | POA: Diagnosis not present

## 2017-09-19 DIAGNOSIS — D630 Anemia in neoplastic disease: Secondary | ICD-10-CM | POA: Diagnosis not present

## 2017-09-19 DIAGNOSIS — Z79891 Long term (current) use of opiate analgesic: Secondary | ICD-10-CM | POA: Diagnosis not present

## 2017-09-19 DIAGNOSIS — N401 Enlarged prostate with lower urinary tract symptoms: Secondary | ICD-10-CM | POA: Diagnosis not present

## 2017-09-19 DIAGNOSIS — Z7902 Long term (current) use of antithrombotics/antiplatelets: Secondary | ICD-10-CM | POA: Diagnosis not present

## 2017-09-19 DIAGNOSIS — C7952 Secondary malignant neoplasm of bone marrow: Secondary | ICD-10-CM | POA: Diagnosis not present

## 2017-09-19 DIAGNOSIS — Z955 Presence of coronary angioplasty implant and graft: Secondary | ICD-10-CM | POA: Diagnosis not present

## 2017-09-19 DIAGNOSIS — Z8673 Personal history of transient ischemic attack (TIA), and cerebral infarction without residual deficits: Secondary | ICD-10-CM | POA: Diagnosis not present

## 2017-09-19 DIAGNOSIS — R3915 Urgency of urination: Secondary | ICD-10-CM | POA: Diagnosis not present

## 2017-09-19 DIAGNOSIS — R350 Frequency of micturition: Secondary | ICD-10-CM | POA: Diagnosis not present

## 2017-09-19 DIAGNOSIS — M109 Gout, unspecified: Secondary | ICD-10-CM | POA: Diagnosis not present

## 2017-09-19 DIAGNOSIS — E785 Hyperlipidemia, unspecified: Secondary | ICD-10-CM | POA: Diagnosis not present

## 2017-09-19 DIAGNOSIS — N189 Chronic kidney disease, unspecified: Secondary | ICD-10-CM | POA: Diagnosis not present

## 2017-09-19 DIAGNOSIS — C61 Malignant neoplasm of prostate: Secondary | ICD-10-CM | POA: Diagnosis not present

## 2017-09-19 DIAGNOSIS — I700 Atherosclerosis of aorta: Secondary | ICD-10-CM | POA: Diagnosis not present

## 2017-09-19 DIAGNOSIS — I129 Hypertensive chronic kidney disease with stage 1 through stage 4 chronic kidney disease, or unspecified chronic kidney disease: Secondary | ICD-10-CM | POA: Diagnosis not present

## 2017-09-19 DIAGNOSIS — I25119 Atherosclerotic heart disease of native coronary artery with unspecified angina pectoris: Secondary | ICD-10-CM | POA: Diagnosis not present

## 2017-09-19 DIAGNOSIS — G893 Neoplasm related pain (acute) (chronic): Secondary | ICD-10-CM | POA: Diagnosis not present

## 2017-09-19 DIAGNOSIS — D509 Iron deficiency anemia, unspecified: Secondary | ICD-10-CM | POA: Diagnosis not present

## 2017-09-19 DIAGNOSIS — R590 Localized enlarged lymph nodes: Secondary | ICD-10-CM | POA: Diagnosis not present

## 2017-09-19 DIAGNOSIS — C7951 Secondary malignant neoplasm of bone: Secondary | ICD-10-CM | POA: Diagnosis not present

## 2017-09-19 DIAGNOSIS — I252 Old myocardial infarction: Secondary | ICD-10-CM | POA: Diagnosis not present

## 2017-09-20 DIAGNOSIS — I129 Hypertensive chronic kidney disease with stage 1 through stage 4 chronic kidney disease, or unspecified chronic kidney disease: Secondary | ICD-10-CM | POA: Diagnosis not present

## 2017-09-20 DIAGNOSIS — Z955 Presence of coronary angioplasty implant and graft: Secondary | ICD-10-CM | POA: Diagnosis not present

## 2017-09-20 DIAGNOSIS — C7952 Secondary malignant neoplasm of bone marrow: Secondary | ICD-10-CM | POA: Diagnosis not present

## 2017-09-20 DIAGNOSIS — D509 Iron deficiency anemia, unspecified: Secondary | ICD-10-CM | POA: Diagnosis not present

## 2017-09-20 DIAGNOSIS — Z7902 Long term (current) use of antithrombotics/antiplatelets: Secondary | ICD-10-CM | POA: Diagnosis not present

## 2017-09-20 DIAGNOSIS — E785 Hyperlipidemia, unspecified: Secondary | ICD-10-CM | POA: Diagnosis not present

## 2017-09-20 DIAGNOSIS — M109 Gout, unspecified: Secondary | ICD-10-CM | POA: Diagnosis not present

## 2017-09-20 DIAGNOSIS — R269 Unspecified abnormalities of gait and mobility: Secondary | ICD-10-CM | POA: Diagnosis not present

## 2017-09-20 DIAGNOSIS — R0600 Dyspnea, unspecified: Secondary | ICD-10-CM | POA: Diagnosis not present

## 2017-09-20 DIAGNOSIS — D630 Anemia in neoplastic disease: Secondary | ICD-10-CM | POA: Diagnosis not present

## 2017-09-20 DIAGNOSIS — G893 Neoplasm related pain (acute) (chronic): Secondary | ICD-10-CM | POA: Diagnosis not present

## 2017-09-20 DIAGNOSIS — R3915 Urgency of urination: Secondary | ICD-10-CM | POA: Diagnosis not present

## 2017-09-20 DIAGNOSIS — R350 Frequency of micturition: Secondary | ICD-10-CM | POA: Diagnosis not present

## 2017-09-20 DIAGNOSIS — N189 Chronic kidney disease, unspecified: Secondary | ICD-10-CM | POA: Diagnosis not present

## 2017-09-20 DIAGNOSIS — R590 Localized enlarged lymph nodes: Secondary | ICD-10-CM | POA: Diagnosis not present

## 2017-09-20 DIAGNOSIS — Z79891 Long term (current) use of opiate analgesic: Secondary | ICD-10-CM | POA: Diagnosis not present

## 2017-09-20 DIAGNOSIS — N401 Enlarged prostate with lower urinary tract symptoms: Secondary | ICD-10-CM | POA: Diagnosis not present

## 2017-09-20 DIAGNOSIS — I252 Old myocardial infarction: Secondary | ICD-10-CM | POA: Diagnosis not present

## 2017-09-20 DIAGNOSIS — I700 Atherosclerosis of aorta: Secondary | ICD-10-CM | POA: Diagnosis not present

## 2017-09-20 DIAGNOSIS — Z8673 Personal history of transient ischemic attack (TIA), and cerebral infarction without residual deficits: Secondary | ICD-10-CM | POA: Diagnosis not present

## 2017-09-20 DIAGNOSIS — C61 Malignant neoplasm of prostate: Secondary | ICD-10-CM | POA: Diagnosis not present

## 2017-09-20 DIAGNOSIS — I25119 Atherosclerotic heart disease of native coronary artery with unspecified angina pectoris: Secondary | ICD-10-CM | POA: Diagnosis not present

## 2017-09-20 DIAGNOSIS — C7951 Secondary malignant neoplasm of bone: Secondary | ICD-10-CM | POA: Diagnosis not present

## 2017-09-20 DIAGNOSIS — D539 Nutritional anemia, unspecified: Secondary | ICD-10-CM | POA: Diagnosis not present

## 2017-09-20 DIAGNOSIS — D6182 Myelophthisis: Secondary | ICD-10-CM | POA: Diagnosis not present

## 2017-09-21 DIAGNOSIS — Z7902 Long term (current) use of antithrombotics/antiplatelets: Secondary | ICD-10-CM | POA: Diagnosis not present

## 2017-09-21 DIAGNOSIS — R590 Localized enlarged lymph nodes: Secondary | ICD-10-CM | POA: Diagnosis not present

## 2017-09-21 DIAGNOSIS — D630 Anemia in neoplastic disease: Secondary | ICD-10-CM | POA: Diagnosis not present

## 2017-09-21 DIAGNOSIS — N401 Enlarged prostate with lower urinary tract symptoms: Secondary | ICD-10-CM | POA: Diagnosis not present

## 2017-09-21 DIAGNOSIS — Z79891 Long term (current) use of opiate analgesic: Secondary | ICD-10-CM | POA: Diagnosis not present

## 2017-09-21 DIAGNOSIS — C61 Malignant neoplasm of prostate: Secondary | ICD-10-CM | POA: Diagnosis not present

## 2017-09-21 DIAGNOSIS — D6182 Myelophthisis: Secondary | ICD-10-CM | POA: Diagnosis not present

## 2017-09-21 DIAGNOSIS — I252 Old myocardial infarction: Secondary | ICD-10-CM | POA: Diagnosis not present

## 2017-09-21 DIAGNOSIS — C7951 Secondary malignant neoplasm of bone: Secondary | ICD-10-CM | POA: Diagnosis not present

## 2017-09-21 DIAGNOSIS — I129 Hypertensive chronic kidney disease with stage 1 through stage 4 chronic kidney disease, or unspecified chronic kidney disease: Secondary | ICD-10-CM | POA: Diagnosis not present

## 2017-09-21 DIAGNOSIS — I700 Atherosclerosis of aorta: Secondary | ICD-10-CM | POA: Diagnosis not present

## 2017-09-21 DIAGNOSIS — C7952 Secondary malignant neoplasm of bone marrow: Secondary | ICD-10-CM | POA: Diagnosis not present

## 2017-09-21 DIAGNOSIS — M109 Gout, unspecified: Secondary | ICD-10-CM | POA: Diagnosis not present

## 2017-09-21 DIAGNOSIS — D539 Nutritional anemia, unspecified: Secondary | ICD-10-CM | POA: Diagnosis not present

## 2017-09-21 DIAGNOSIS — I25119 Atherosclerotic heart disease of native coronary artery with unspecified angina pectoris: Secondary | ICD-10-CM | POA: Diagnosis not present

## 2017-09-21 DIAGNOSIS — E785 Hyperlipidemia, unspecified: Secondary | ICD-10-CM | POA: Diagnosis not present

## 2017-09-21 DIAGNOSIS — D509 Iron deficiency anemia, unspecified: Secondary | ICD-10-CM | POA: Diagnosis not present

## 2017-09-21 DIAGNOSIS — Z8673 Personal history of transient ischemic attack (TIA), and cerebral infarction without residual deficits: Secondary | ICD-10-CM | POA: Diagnosis not present

## 2017-09-21 DIAGNOSIS — G893 Neoplasm related pain (acute) (chronic): Secondary | ICD-10-CM | POA: Diagnosis not present

## 2017-09-21 DIAGNOSIS — N189 Chronic kidney disease, unspecified: Secondary | ICD-10-CM | POA: Diagnosis not present

## 2017-09-21 DIAGNOSIS — Z955 Presence of coronary angioplasty implant and graft: Secondary | ICD-10-CM | POA: Diagnosis not present

## 2017-09-21 DIAGNOSIS — R3915 Urgency of urination: Secondary | ICD-10-CM | POA: Diagnosis not present

## 2017-09-21 DIAGNOSIS — R350 Frequency of micturition: Secondary | ICD-10-CM | POA: Diagnosis not present

## 2017-09-25 DIAGNOSIS — R590 Localized enlarged lymph nodes: Secondary | ICD-10-CM | POA: Diagnosis not present

## 2017-09-25 DIAGNOSIS — M109 Gout, unspecified: Secondary | ICD-10-CM | POA: Diagnosis not present

## 2017-09-25 DIAGNOSIS — Z8673 Personal history of transient ischemic attack (TIA), and cerebral infarction without residual deficits: Secondary | ICD-10-CM | POA: Diagnosis not present

## 2017-09-25 DIAGNOSIS — D6182 Myelophthisis: Secondary | ICD-10-CM | POA: Diagnosis not present

## 2017-09-25 DIAGNOSIS — I252 Old myocardial infarction: Secondary | ICD-10-CM | POA: Diagnosis not present

## 2017-09-25 DIAGNOSIS — E785 Hyperlipidemia, unspecified: Secondary | ICD-10-CM | POA: Diagnosis not present

## 2017-09-25 DIAGNOSIS — I129 Hypertensive chronic kidney disease with stage 1 through stage 4 chronic kidney disease, or unspecified chronic kidney disease: Secondary | ICD-10-CM | POA: Diagnosis not present

## 2017-09-25 DIAGNOSIS — C7952 Secondary malignant neoplasm of bone marrow: Secondary | ICD-10-CM | POA: Diagnosis not present

## 2017-09-25 DIAGNOSIS — Z7902 Long term (current) use of antithrombotics/antiplatelets: Secondary | ICD-10-CM | POA: Diagnosis not present

## 2017-09-25 DIAGNOSIS — C7951 Secondary malignant neoplasm of bone: Secondary | ICD-10-CM | POA: Diagnosis not present

## 2017-09-25 DIAGNOSIS — D539 Nutritional anemia, unspecified: Secondary | ICD-10-CM | POA: Diagnosis not present

## 2017-09-25 DIAGNOSIS — G893 Neoplasm related pain (acute) (chronic): Secondary | ICD-10-CM | POA: Diagnosis not present

## 2017-09-25 DIAGNOSIS — D509 Iron deficiency anemia, unspecified: Secondary | ICD-10-CM | POA: Diagnosis not present

## 2017-09-25 DIAGNOSIS — Z955 Presence of coronary angioplasty implant and graft: Secondary | ICD-10-CM | POA: Diagnosis not present

## 2017-09-25 DIAGNOSIS — Z79891 Long term (current) use of opiate analgesic: Secondary | ICD-10-CM | POA: Diagnosis not present

## 2017-09-25 DIAGNOSIS — N401 Enlarged prostate with lower urinary tract symptoms: Secondary | ICD-10-CM | POA: Diagnosis not present

## 2017-09-25 DIAGNOSIS — R350 Frequency of micturition: Secondary | ICD-10-CM | POA: Diagnosis not present

## 2017-09-25 DIAGNOSIS — C61 Malignant neoplasm of prostate: Secondary | ICD-10-CM | POA: Diagnosis not present

## 2017-09-25 DIAGNOSIS — D630 Anemia in neoplastic disease: Secondary | ICD-10-CM | POA: Diagnosis not present

## 2017-09-25 DIAGNOSIS — R3915 Urgency of urination: Secondary | ICD-10-CM | POA: Diagnosis not present

## 2017-09-25 DIAGNOSIS — N189 Chronic kidney disease, unspecified: Secondary | ICD-10-CM | POA: Diagnosis not present

## 2017-09-25 DIAGNOSIS — I25119 Atherosclerotic heart disease of native coronary artery with unspecified angina pectoris: Secondary | ICD-10-CM | POA: Diagnosis not present

## 2017-09-25 DIAGNOSIS — I700 Atherosclerosis of aorta: Secondary | ICD-10-CM | POA: Diagnosis not present

## 2017-09-26 ENCOUNTER — Telehealth: Payer: Self-pay

## 2017-09-26 NOTE — Telephone Encounter (Signed)
LVM for Tony [name], OT with [facility] confirming verbal order for OT once weekly for four weeks to improved strength, safety, and self care per Dr. [name].

## 2017-09-26 NOTE — Telephone Encounter (Signed)
Orders signed and faxed back to [place] 09/26/17 at 1219.

## 2017-09-27 ENCOUNTER — Encounter: Payer: Self-pay | Admitting: Cardiology

## 2017-09-27 ENCOUNTER — Encounter

## 2017-09-27 ENCOUNTER — Other Ambulatory Visit: Payer: Self-pay | Admitting: Hematology

## 2017-09-27 ENCOUNTER — Ambulatory Visit (INDEPENDENT_AMBULATORY_CARE_PROVIDER_SITE_OTHER): Payer: Medicare Other | Admitting: Cardiology

## 2017-09-27 VITALS — BP 128/74 | HR 86 | Ht 67.0 in | Wt 185.0 lb

## 2017-09-27 DIAGNOSIS — I2510 Atherosclerotic heart disease of native coronary artery without angina pectoris: Secondary | ICD-10-CM

## 2017-09-27 NOTE — Progress Notes (Signed)
09/27/2017 CANDELARIO STEPPE   [birth year]  [id number]  Primary Physician [name], MD Primary Cardiologist: Dr. Irish Lack  Reason for Visit/CC: Routine 6 Month F/u for CAD  HPI:  Tony [name] is a 84 y.o. male with a history of CAD, hypertension, hyperlipidemia, CKD and prior history of stroke, followed by Dr. Irish Lack,  who presents to clinic today for routine cardiac evaluation.  He has a history of non-STEMI September 2012 that was treated at [facility] in [place].  Full cath report is scanned into our system.  At that time, he was found to be in complete heart block secondary to acute coronary syndrome (NSTEMI).  Cardiac catheterization essentially showed 100% RCA occlusion, filled with left-to-right collaterals and an 80% LAD occlusion.  The LAD was intervened upon and he underwent successful PTCA and stenting.  The RCA was treated medically.  He has done well with medical management.  He has not had any recurrent anginal symptomatology and no indication for repeat cath since his initial MI in 2012.  He is here with his daughter today. He is in a wheel chair but can ambulate with a walker. He has a new diagnosis of stage 4 prostate cancer with metastasis to the spine. He is followed by Dr. [name], oncologist, and getting hormone therapy. This was just diagnosed 2 months ago. He has also had issues with anemia and hypotension. He has had a few medication changes. His PCP stopped amlodipine due to hypotension and his Flomax was reduced from BID to once daily. BP improved and symptoms of dizziness resolved. No syncope/ near syncope. He also had to stop Lipitor due to myalgias and arthralgias involving the lower extremities. Statin intolerant.   From a cardiac standpoint, he had done well.  Chest pain or dyspnea.  Also denies palpitations, lightheadedness, syncope/near syncope.  He does note chronic lower extremity edema that has been present since 2012 when he had his MI.  It has  not gotten any worse.  He has had no associated dyspnea.  No orthopnea or PND.  He sleeps with one pillow.  There is also been no weight gain.  He has had progressive weight loss.  His daughter notes that he has lost 25 pounds over the course of a year, which is felt to be likely from his metastatic cancer.    Current Meds  Medication Sig  . aspirin EC 81 MG tablet Take 1 tablet (81 mg total) by mouth daily.  . calcium-vitamin D (OSCAL 500/200 D-3) 500-200 MG-UNIT tablet Take 1 tablet by mouth daily with breakfast.  . ergocalciferol (VITAMIN D2) 50000 units capsule Take 1 capsule (50,000 Units total) by mouth 2 (two) times a week.  . isosorbide-hydrALAZINE (BIDIL) 20-37.5 MG tablet Take 1 tablet by mouth 2 (two) times daily.  . metoprolol succinate (TOPROL-XL) 25 MG 24 hr tablet Take 1 tablet (25 mg total) by mouth daily.  . Multiple Vitamins-Minerals (MULTI COMPLETE PO) Take by mouth.  . nitroGLYCERIN (NITROSTAT) 0.4 MG SL tablet Place 1 tablet (0.4 mg total) under the tongue every 5 (five) minutes as needed for chest pain (3 maximum before seeking care).  . Omega-3 1000 MG CAPS Take 1 capsule by mouth daily.   Marland Kitchen oxyCODONE (OXY IR/ROXICODONE) 5 MG immediate release tablet Take 1 tablet (5 mg total) by mouth every 4 (four) hours as needed for severe pain.  . polyethylene glycol (MIRALAX) packet Take 17 g by mouth daily.  Marland Kitchen senna-docusate (SENNA S) 8.6-50 MG  tablet Take 2 tablets by mouth 2 (two) times daily.  . tamsulosin (FLOMAX) 0.4 MG CAPS capsule Take 0.4 mg by mouth daily.  . vitamin B-12 (CYANOCOBALAMIN) 1000 MCG tablet Take 1 tablet (1,000 mcg total) by mouth daily.  . [DISCONTINUED] isosorbide-hydrALAZINE (BIDIL) 20-37.5 MG tablet Take 1 tablet by mouth 3 (three) times daily. (Patient taking differently: Take 1 tablet by mouth 2 (two) times daily. )  . [DISCONTINUED] tamsulosin (FLOMAX) 0.4 MG CAPS capsule Take 1 capsule (0.4 mg total) by mouth 2 (two) times daily. (Patient taking  differently: Take 0.4 mg by mouth daily. )  . [DISCONTINUED] traMADol (ULTRAM) 50 MG tablet Take by mouth every 6 (six) hours as needed.   No Known Allergies Past Medical History:  Diagnosis Date  . BPH associated with nocturia    flomax 0.4--> 0.8 mg trial. nocturia if has coffee. some incontinence  . CAD (coronary artery disease)    LAD Stent 2012. 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   . Gout    no rx. apparently 1x in past  . History of stroke    no aspirin before stroke. no deficits. slurred words at time of stroke  . Hyperlipidemia   . Hypertension    Family History  Problem Relation Age of Onset  . Breast cancer Mother   . Heart disease Father   . Heart disease Brother        x2  . Prostate cancer Brother    Past Surgical History:  Procedure Laterality Date  . CARDIAC SURGERY     Stint  . CORONARY STENT PLACEMENT    . left arm fracture s/p surgery    . right leg fracture s.p surgery- screws like arm     Social History   Socioeconomic History  . Marital status: Married    Spouse name: Not on file  . Number of children: Not on file  . Years of education: Not on file  . Highest education level: Not on file  Occupational History  . Not on file  Social Needs  . Financial resource strain: Not on file  . Food insecurity:    Worry: Not on file    Inability: Not on file  . Transportation needs:    Medical: Not on file    Non-medical: Not on file  Tobacco Use  . Smoking status: Former Smoker    Packs/day: 0.50    Years: 1.00    Pack years: 0.50    Types: Cigarettes    Last attempt to quit: 05/01/1952    Years since quitting: 65.4  . Smokeless tobacco: Never Used  Substance and Sexual Activity  . Alcohol use: No  . Drug use: No  . Sexual activity: Not on file  Lifestyle  . Physical activity:    Days per week: Not on file    Minutes per session: Not on file  . Stress: Not on file  Relationships  . Social connections:    Talks on  phone: Not on file    Gets together: Not on file    Attends religious service: Not on file    Active member of club or organization: Not on file    Attends meetings of clubs or organizations: Not on file    Relationship status: Not on file  . Intimate partner violence:    Fear of current or ex partner: Not on file    Emotionally abused: Not on file    Physically abused:  Not on file    Forced sexual activity: Not on file  Other Topics Concern  . Not on file  Social History Narrative   Married- lives separate from wife. 2 children. Daughter passed from heart attack.       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  Cardiovascular: negative for chest pain, dyspnea on exertion, edema, orthopnea, palpitations, paroxysmal nocturnal dyspnea or shortness of breath Dermatological: negative for rash Respiratory: negative for cough or wheezing Urologic: negative for hematuria Abdominal: negative for nausea, vomiting, diarrhea, bright red blood per rectum, melena, or hematemesis Neurologic: negative for visual changes, syncope, or dizziness All other systems reviewed and are otherwise negative except as noted above.   Physical Exam:  Blood pressure 128/74, pulse 86, height 5\' 7"  (1.702 m), weight 185 lb (83.9 kg), SpO2 94 %.  General appearance: alert, cooperative and no distress Neck: no carotid bruit and no JVD Lungs: clear to auscultation bilaterally Heart: regular rate and rhythm, S1, S2 normal, no murmur, click, rub or gallop Extremities: extremities normal, atraumatic, no cyanosis or edema Pulses: 2+ and symmetric Skin: Skin color, texture, turgor normal. No rashes or lesions Neurologic: Grossly normal  EKG not performed -- personally reviewed   ASSESSMENT AND PLAN:   1. CAD: History of non-ST elevation myocardial infarction, treated at an outside  hospital ([facility]) treated with PTCA/coronary stenting to the LAD.  There was residual chronic total occlusion of the RCA that, at that time, filled with left-to-right collaterals.  Medical management was elected for the RCA.  He has done well since without any recurrent anginal symptomatology on medical management and there has been no indication for repeat cardiac catheterization since his initial non-STEMI in 2012. We will continue medical management w/ ASA and BB. He is statin intolerant.   2. HTN: controlled on current regimen. He has had recent hypotension and dizziness, but BP improved and symptoms resolved after discontinuation of amlodipine and reduction in Flomax from BID to once daily dosing. He has a home health RN who monitors BP regularly.   3. HLD: statin intolerant.   4. Remote H/o Stroke: treated in [facility]. BP is stable. He is on daily ASA.   5. Stage 4 Metastatic Prostate CA: new diagnosis. Getting hormone therapy. Mets to the spine.  6. Stage 3B CKD: GFR 33 mL/min. Baseline Scr ~2.0. His daughter raised concerns about diagnosis. He is not followed by nephrology. Given metastatic cancer, will defer treatment/ monitoring to PCP.   7. LEE: Per pt, he has had chronic edema dating back to 2012. No major changes. It has not gotten any worse. amlodipine recently stopped 4 weeks ago. Trace edema is noted by my exam. No associated dyspnea, orthopnea nor PND. No weight gain. He has had progressive 25 lb weight loss. Lungs are CTAB. Given the stable nature of his edema, do not suspect acute CHF. Given his recently hypotension and abnormal renal function, we will avoid adding a low dose diuretic at this time. Pt advised to notify us if any change/ worsening edema or development of dyspnea. Given his metastatic cancer and advanced age, will not pursue any additional w/u at this time, unless condition worsens.    Follow-Up in 1 year w/ Dr. Irish Lack.   Faelyn Sigler Ladoris Gene,  [facility] 09/27/2017 11:11 AM

## 2017-09-27 NOTE — Patient Instructions (Signed)
Medication Instructions:  1. Your physician recommends that you continue on your current medications as directed. Please refer to the Current Medication list given to you today.   Labwork: NONE ORDERED TODAY  Testing/Procedures: NONE ORDERED TODAY  Follow-Up: Your physician wants you to follow-up in: [place] DR. VARANASI  You will receive a reminder letter in the mail two months in advance. If you don't receive a letter, please call our office to schedule the follow-up appointment.   Any Other Special Instructions Will Be Listed Below (If Applicable).     If you need a refill on your cardiac medications before your next appointment, please call your pharmacy.

## 2017-09-28 ENCOUNTER — Telehealth: Payer: Self-pay

## 2017-09-28 DIAGNOSIS — N401 Enlarged prostate with lower urinary tract symptoms: Secondary | ICD-10-CM | POA: Diagnosis not present

## 2017-09-28 DIAGNOSIS — R3915 Urgency of urination: Secondary | ICD-10-CM | POA: Diagnosis not present

## 2017-09-28 DIAGNOSIS — D6182 Myelophthisis: Secondary | ICD-10-CM | POA: Diagnosis not present

## 2017-09-28 DIAGNOSIS — D630 Anemia in neoplastic disease: Secondary | ICD-10-CM | POA: Diagnosis not present

## 2017-09-28 DIAGNOSIS — D539 Nutritional anemia, unspecified: Secondary | ICD-10-CM | POA: Diagnosis not present

## 2017-09-28 DIAGNOSIS — Z8673 Personal history of transient ischemic attack (TIA), and cerebral infarction without residual deficits: Secondary | ICD-10-CM | POA: Diagnosis not present

## 2017-09-28 DIAGNOSIS — G893 Neoplasm related pain (acute) (chronic): Secondary | ICD-10-CM | POA: Diagnosis not present

## 2017-09-28 DIAGNOSIS — D509 Iron deficiency anemia, unspecified: Secondary | ICD-10-CM | POA: Diagnosis not present

## 2017-09-28 DIAGNOSIS — N189 Chronic kidney disease, unspecified: Secondary | ICD-10-CM | POA: Diagnosis not present

## 2017-09-28 DIAGNOSIS — C7951 Secondary malignant neoplasm of bone: Secondary | ICD-10-CM | POA: Diagnosis not present

## 2017-09-28 DIAGNOSIS — M109 Gout, unspecified: Secondary | ICD-10-CM | POA: Diagnosis not present

## 2017-09-28 DIAGNOSIS — R350 Frequency of micturition: Secondary | ICD-10-CM | POA: Diagnosis not present

## 2017-09-28 DIAGNOSIS — I25119 Atherosclerotic heart disease of native coronary artery with unspecified angina pectoris: Secondary | ICD-10-CM | POA: Diagnosis not present

## 2017-09-28 DIAGNOSIS — Z7902 Long term (current) use of antithrombotics/antiplatelets: Secondary | ICD-10-CM | POA: Diagnosis not present

## 2017-09-28 DIAGNOSIS — I252 Old myocardial infarction: Secondary | ICD-10-CM | POA: Diagnosis not present

## 2017-09-28 DIAGNOSIS — Z79891 Long term (current) use of opiate analgesic: Secondary | ICD-10-CM | POA: Diagnosis not present

## 2017-09-28 DIAGNOSIS — Z955 Presence of coronary angioplasty implant and graft: Secondary | ICD-10-CM | POA: Diagnosis not present

## 2017-09-28 DIAGNOSIS — I700 Atherosclerosis of aorta: Secondary | ICD-10-CM | POA: Diagnosis not present

## 2017-09-28 DIAGNOSIS — I129 Hypertensive chronic kidney disease with stage 1 through stage 4 chronic kidney disease, or unspecified chronic kidney disease: Secondary | ICD-10-CM | POA: Diagnosis not present

## 2017-09-28 DIAGNOSIS — C61 Malignant neoplasm of prostate: Secondary | ICD-10-CM | POA: Diagnosis not present

## 2017-09-28 DIAGNOSIS — C7952 Secondary malignant neoplasm of bone marrow: Secondary | ICD-10-CM | POA: Diagnosis not present

## 2017-09-28 DIAGNOSIS — R590 Localized enlarged lymph nodes: Secondary | ICD-10-CM | POA: Diagnosis not present

## 2017-09-28 DIAGNOSIS — E785 Hyperlipidemia, unspecified: Secondary | ICD-10-CM | POA: Diagnosis not present

## 2017-09-28 NOTE — Telephone Encounter (Signed)
Received phone call from Herbert Deaner, PT at [facility] requesting verbal order for Speech Therapy Referral to assist with cognition and safety. In-basket sent to Dr. [name] and Ardyth Harps, RN to address on Monday. Return number [phone].

## 2017-10-01 ENCOUNTER — Telehealth: Payer: Self-pay | Admitting: *Deleted

## 2017-10-01 ENCOUNTER — Encounter: Payer: Self-pay | Admitting: Hematology

## 2017-10-01 ENCOUNTER — Other Ambulatory Visit: Payer: Self-pay | Admitting: Hematology

## 2017-10-01 DIAGNOSIS — Z7902 Long term (current) use of antithrombotics/antiplatelets: Secondary | ICD-10-CM | POA: Diagnosis not present

## 2017-10-01 DIAGNOSIS — N401 Enlarged prostate with lower urinary tract symptoms: Secondary | ICD-10-CM | POA: Diagnosis not present

## 2017-10-01 DIAGNOSIS — D630 Anemia in neoplastic disease: Secondary | ICD-10-CM | POA: Diagnosis not present

## 2017-10-01 DIAGNOSIS — I252 Old myocardial infarction: Secondary | ICD-10-CM | POA: Diagnosis not present

## 2017-10-01 DIAGNOSIS — D509 Iron deficiency anemia, unspecified: Secondary | ICD-10-CM | POA: Diagnosis not present

## 2017-10-01 DIAGNOSIS — D6182 Myelophthisis: Secondary | ICD-10-CM | POA: Diagnosis not present

## 2017-10-01 DIAGNOSIS — G893 Neoplasm related pain (acute) (chronic): Secondary | ICD-10-CM | POA: Diagnosis not present

## 2017-10-01 DIAGNOSIS — M109 Gout, unspecified: Secondary | ICD-10-CM | POA: Diagnosis not present

## 2017-10-01 DIAGNOSIS — C7951 Secondary malignant neoplasm of bone: Secondary | ICD-10-CM | POA: Diagnosis not present

## 2017-10-01 DIAGNOSIS — R3915 Urgency of urination: Secondary | ICD-10-CM | POA: Diagnosis not present

## 2017-10-01 DIAGNOSIS — R590 Localized enlarged lymph nodes: Secondary | ICD-10-CM | POA: Diagnosis not present

## 2017-10-01 DIAGNOSIS — I700 Atherosclerosis of aorta: Secondary | ICD-10-CM | POA: Diagnosis not present

## 2017-10-01 DIAGNOSIS — Z8673 Personal history of transient ischemic attack (TIA), and cerebral infarction without residual deficits: Secondary | ICD-10-CM | POA: Diagnosis not present

## 2017-10-01 DIAGNOSIS — C61 Malignant neoplasm of prostate: Secondary | ICD-10-CM | POA: Diagnosis not present

## 2017-10-01 DIAGNOSIS — C7952 Secondary malignant neoplasm of bone marrow: Secondary | ICD-10-CM | POA: Diagnosis not present

## 2017-10-01 DIAGNOSIS — E785 Hyperlipidemia, unspecified: Secondary | ICD-10-CM | POA: Diagnosis not present

## 2017-10-01 DIAGNOSIS — R350 Frequency of micturition: Secondary | ICD-10-CM | POA: Diagnosis not present

## 2017-10-01 DIAGNOSIS — I25119 Atherosclerotic heart disease of native coronary artery with unspecified angina pectoris: Secondary | ICD-10-CM | POA: Diagnosis not present

## 2017-10-01 DIAGNOSIS — N189 Chronic kidney disease, unspecified: Secondary | ICD-10-CM | POA: Diagnosis not present

## 2017-10-01 DIAGNOSIS — I129 Hypertensive chronic kidney disease with stage 1 through stage 4 chronic kidney disease, or unspecified chronic kidney disease: Secondary | ICD-10-CM | POA: Diagnosis not present

## 2017-10-01 DIAGNOSIS — D539 Nutritional anemia, unspecified: Secondary | ICD-10-CM | POA: Diagnosis not present

## 2017-10-01 DIAGNOSIS — Z79891 Long term (current) use of opiate analgesic: Secondary | ICD-10-CM | POA: Diagnosis not present

## 2017-10-01 DIAGNOSIS — Z955 Presence of coronary angioplasty implant and graft: Secondary | ICD-10-CM | POA: Diagnosis not present

## 2017-10-01 MED ORDER — OXYCODONE HCL 5 MG PO TABS: 5.0000 mg | ORAL_TABLET | ORAL | 0 refills | Status: DC | PRN

## 2017-10-01 NOTE — Telephone Encounter (Signed)
Tony [name] [phone] calling to request a verbal order for speech therapy, and evaluation for safety

## 2017-10-01 NOTE — Telephone Encounter (Signed)
Daughter [name] calling to request a refill on lxycodone for her father. He sees dr [name] 10-04-17. But is out of oxycodone. Note to dr Grier Mitts desk re: refill.

## 2017-10-02 DIAGNOSIS — Z955 Presence of coronary angioplasty implant and graft: Secondary | ICD-10-CM | POA: Diagnosis not present

## 2017-10-02 DIAGNOSIS — I25119 Atherosclerotic heart disease of native coronary artery with unspecified angina pectoris: Secondary | ICD-10-CM | POA: Diagnosis not present

## 2017-10-02 DIAGNOSIS — M109 Gout, unspecified: Secondary | ICD-10-CM | POA: Diagnosis not present

## 2017-10-02 DIAGNOSIS — Z8673 Personal history of transient ischemic attack (TIA), and cerebral infarction without residual deficits: Secondary | ICD-10-CM | POA: Diagnosis not present

## 2017-10-02 DIAGNOSIS — Z7902 Long term (current) use of antithrombotics/antiplatelets: Secondary | ICD-10-CM | POA: Diagnosis not present

## 2017-10-02 DIAGNOSIS — N189 Chronic kidney disease, unspecified: Secondary | ICD-10-CM | POA: Diagnosis not present

## 2017-10-02 DIAGNOSIS — R590 Localized enlarged lymph nodes: Secondary | ICD-10-CM | POA: Diagnosis not present

## 2017-10-02 DIAGNOSIS — I129 Hypertensive chronic kidney disease with stage 1 through stage 4 chronic kidney disease, or unspecified chronic kidney disease: Secondary | ICD-10-CM | POA: Diagnosis not present

## 2017-10-02 DIAGNOSIS — C7951 Secondary malignant neoplasm of bone: Secondary | ICD-10-CM | POA: Diagnosis not present

## 2017-10-02 DIAGNOSIS — D630 Anemia in neoplastic disease: Secondary | ICD-10-CM | POA: Diagnosis not present

## 2017-10-02 DIAGNOSIS — C61 Malignant neoplasm of prostate: Secondary | ICD-10-CM | POA: Diagnosis not present

## 2017-10-02 DIAGNOSIS — Z79891 Long term (current) use of opiate analgesic: Secondary | ICD-10-CM | POA: Diagnosis not present

## 2017-10-02 DIAGNOSIS — I252 Old myocardial infarction: Secondary | ICD-10-CM | POA: Diagnosis not present

## 2017-10-02 DIAGNOSIS — D6182 Myelophthisis: Secondary | ICD-10-CM | POA: Diagnosis not present

## 2017-10-02 DIAGNOSIS — R3915 Urgency of urination: Secondary | ICD-10-CM | POA: Diagnosis not present

## 2017-10-02 DIAGNOSIS — R350 Frequency of micturition: Secondary | ICD-10-CM | POA: Diagnosis not present

## 2017-10-02 DIAGNOSIS — E785 Hyperlipidemia, unspecified: Secondary | ICD-10-CM | POA: Diagnosis not present

## 2017-10-02 DIAGNOSIS — N401 Enlarged prostate with lower urinary tract symptoms: Secondary | ICD-10-CM | POA: Diagnosis not present

## 2017-10-02 DIAGNOSIS — G893 Neoplasm related pain (acute) (chronic): Secondary | ICD-10-CM | POA: Diagnosis not present

## 2017-10-02 DIAGNOSIS — I700 Atherosclerosis of aorta: Secondary | ICD-10-CM | POA: Diagnosis not present

## 2017-10-02 DIAGNOSIS — D509 Iron deficiency anemia, unspecified: Secondary | ICD-10-CM | POA: Diagnosis not present

## 2017-10-02 DIAGNOSIS — C7952 Secondary malignant neoplasm of bone marrow: Secondary | ICD-10-CM | POA: Diagnosis not present

## 2017-10-02 DIAGNOSIS — D539 Nutritional anemia, unspecified: Secondary | ICD-10-CM | POA: Diagnosis not present

## 2017-10-02 NOTE — Progress Notes (Signed)
HEMATOLOGY/ONCOLOGY CLINIC NOTE  Date of Service: 10/04/17  Patient Care Team: Tony Barrack, MD as PCP - General (Family Medicine)  CHIEF COMPLAINTS/PURPOSE OF CONSULTATION:  -recently diagnosed metastatic prostate cancer -Myelopthisic anemia  HISTORY OF PRESENTING ILLNESS:   Tony [name] is a wonderful 84 y.o. male who has been referred to [place] by Dr Tony [name] for evaluation and management of anemia. He is accompanied today by his daughter. The pt reports that he is doing well overall.   The pt reports a new onset of fatigue that began in January 2019. His daughter notes being able to tell a difference in his energy levels as early as November 2018. He notes that some cramping pain in his thighs. He notes that his recent 07/23/17 blood transfusion has led to a "terrific, immediate change" in his thigh pain. However, his thigh pain has returned in the last couple days.  He notes that he takes 1045mg Vitamin B12 daily.   He notes that for 6-7 years he took iron pills after his 2012 heart attack and stroke. He notes that he took a statin for 6-7 months and was taken off of it recently. He notes no unresolved symptoms from his stroke.   The pt notes that over the last 4-5 months his appetite has diminished and he has subsequently lost about 20 lbs in that time.  He notes that he has historically not preferred to drink water as such, but hydrates with juice, coffee, and other drinks.  He notes that he believes that he is able to take care of himself adequately at home. He also notes that his cane is sufficient for helping him get around.   Most recent lab results (07/22/17) of CBC  is as follows: all values are WNL except for RBC at 2.94, Hgb at 8.7, HCT at 27.2, RDW at 26.5, Platelets at 134k. CBC from 10/30/16 revealed all values WNL.  CMP 07/21/17 revealed all values WNL except for Sodium at 134, Glucose at 101, Creatinine at 1.85, Calcium at 7.9, Total Protein at 5.7, Albumin at  2.7, Alk Phos at 433.  LDH 07/20/17 elevated at 256. Haptoglobin 07/20/17 elevated at 357.  Vitamin B12 07/06/17 is WNL at 229.   On review of systems, pt reports decreased appetite, losing 20 lbs over 4-5 months, bilateral thigh pain, fatigue, mild leg swelling, and denies fevers, chills, night sweats, bone pains, nausea, abdominal pains, bleeding, blood in the urine, blood in the stools, black stools, changes in bowel habits, light headednss, dizziness, abdominal pains, noticing any new lumps or bumps, testicular pain or swelling, and any other symptoms.   On PMHx the pt reports heart attack and stroke in 2012. On Social Hx the pt denies much ETOH consumption.   Interval History:  Mr. FJawara Latorrereturns today regarding his metastatic prostate cancer and myelopthisic anemia. The patient's last visit with [place] on 09/06/17. He is accompanied today by his daughter. The pt reports that he is doing well overall.   The pt reports that his thighs continue to ache when he walks and it inhibits his walking. He has been regularly attending PT and is very pleased with his time spent there. He notes that the oxycodone does not help his leg pain when he is walking. He is holding to every 4 hours of oxycodone except when he sleeps, taking about 4-5 pills a day.   He also notes that he has tried to eat better and has continued to  take Senna S for his constipation, which is successfully treating this.  He notes that he has short-term forgetfulness which he attributes to the stroke he had in 2012 as well as his age. His daughter notes an increased frequency in his forgetfulness, not remembering which medications he's been prescribed and the reasons for which he has been prescribed a particular medication. She notes that some days are good and some days are not.   Lab results today (10/04/17) of CBC, CMP, and Reticulocytes is as follows: all values are WNL except for RBC at 2.93, HGB at 9.1, HCT at 29.1, MCV at 99.3,  MCHC at 31.3, RDW at 21.9, Platelets at 135k, Sodium at 135, CO2 at 21, Creatinine at 1.70, Calcium at 7.7, Albumin at 3.4, Alk Phos at 494, Retic ct pct at 2.8%. PSA, serum 10/04/17 is pending Vitamin D 10/04/17 is pending  On review of systems, pt reports eating more, some weight gain, leg pain when walking, and denies fatigue, constipation, abdominal pains, and any other symptoms.    MEDICAL HISTORY:  Past Medical History:  Diagnosis Date  . BPH associated with nocturia    flomax 0.4--> 0.8 mg trial. nocturia if has coffee. some incontinence  . CAD (coronary artery disease)    LAD Stent 2012. 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   . Gout    no rx. apparently 1x in past  . History of stroke    no aspirin before stroke. no deficits. slurred words at time of stroke  . Hyperlipidemia   . Hypertension     SURGICAL HISTORY: Past Surgical History:  Procedure Laterality Date  . CARDIAC SURGERY     Stint  . CORONARY STENT PLACEMENT    . left arm fracture s/p surgery    . right leg fracture s.p surgery- screws like arm      SOCIAL HISTORY: Social History   Socioeconomic History  . Marital status: Married    Spouse name: Not on file  . Number of children: Not on file  . Years of education: Not on file  . Highest education level: Not on file  Occupational History  . Not on file  Social Needs  . Financial resource strain: Not on file  . Food insecurity:    Worry: Not on file    Inability: Not on file  . Transportation needs:    Medical: Not on file    Non-medical: Not on file  Tobacco Use  . Smoking status: Former Smoker    Packs/day: 0.50    Years: 1.00    Pack years: 0.50    Types: Cigarettes    Last attempt to quit: 05/01/1952    Years since quitting: 65.4  . Smokeless tobacco: Never Used  Substance and Sexual Activity  . Alcohol use: No  . Drug use: No  . Sexual activity: Not on file  Lifestyle  . Physical activity:    Days per week: Not on  file    Minutes per session: Not on file  . Stress: Not on file  Relationships  . Social connections:    Talks on phone: Not on file    Gets together: Not on file    Attends religious service: Not on file    Active member of club or organization: Not on file    Attends meetings of clubs or organizations: Not on file    Relationship status: Not on file  . Intimate partner violence:  Fear of current or ex partner: Not on file    Emotionally abused: Not on file    Physically abused: Not on file    Forced sexual activity: Not on file  Other Topics Concern  . Not on file  Social History Narrative   Married- lives separate from wife. 2 children. Daughter passed from heart attack.       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 Breast cancer Mother   . Heart disease Father   . Heart disease Brother        x2  . Prostate cancer Brother     ALLERGIES:  has No Known Allergies.  MEDICATIONS:  Current Outpatient Medications  Medication Sig Dispense Refill  . aspirin EC 81 MG tablet Take 1 tablet (81 mg total) by mouth daily. 90 tablet 3  . calcium-vitamin D (OSCAL 500/200 D-3) 500-200 MG-UNIT tablet Take 1 tablet by mouth daily with breakfast. 30 tablet 2  . ergocalciferol (VITAMIN D2) 50000 units capsule Take 1 capsule (50,000 Units total) by mouth 2 (two) times a week. 24 capsule 1  . isosorbide-hydrALAZINE (BIDIL) 20-37.5 MG tablet Take 1 tablet by mouth 2 (two) times daily.    . metoprolol succinate (TOPROL-XL) 25 MG 24 hr tablet Take 1 tablet (25 mg total) by mouth daily. 90 tablet 3  . Multiple Vitamins-Minerals (MULTI COMPLETE PO) Take by mouth.    . nitroGLYCERIN (NITROSTAT) 0.4 MG SL tablet Place 1 tablet (0.4 mg total) under the tongue every 5 (five) minutes as needed for chest pain (3 maximum before seeking care). 30 tablet 0  . Omega-3 1000 MG CAPS Take 1 capsule by  mouth daily.     Marland Kitchen oxyCODONE (OXY IR/ROXICODONE) 5 MG immediate release tablet Take 1 tablet (5 mg total) by mouth every 4 (four) hours as needed for severe pain. 90 tablet 0  . polyethylene glycol (MIRALAX) packet Take 17 g by mouth daily. 30 each 1  . senna-docusate (SENNA S) 8.6-50 MG tablet Take 2 tablets by mouth 2 (two) times daily. 120 tablet 2  . tamsulosin (FLOMAX) 0.4 MG CAPS capsule Take 0.4 mg by mouth daily.    . vitamin B-12 (CYANOCOBALAMIN) 1000 MCG tablet Take 1 tablet (1,000 mcg total) by mouth daily. 30 tablet 0  . fentaNYL (DURAGESIC - DOSED MCG/HR) 12 MCG/HR Place 1 patch (12.5 mcg total) onto the skin every 3 (three) days. 10 patch 0   No current facility-administered medications for this visit.     REVIEW OF SYSTEMS:    A 10+ POINT REVIEW OF SYSTEMS WAS OBTAINED including neurology, dermatology, psychiatry, cardiac, respiratory, lymph, extremities, GI, GU, Musculoskeletal, constitutional, breasts, reproductive, HEENT.  All pertinent positives are noted in the HPI.  All others are negative.   PHYSICAL EXAMINATION:  . Vitals:   10/04/17 1342  BP: 137/70  Pulse: 80  Resp: 18  Temp: 98 F (36.7 C)  SpO2: 99%   Filed Weights   10/04/17 1342  Weight: 186 lb 1.6 oz (84.4 kg)   .Body mass index is 29.15 kg/m.  GENERAL:alert, in no acute distress and comfortable SKIN: no acute rashes, no significant lesions EYES: conjunctival pallor noted, sclera anicteric OROPHARYNX: MMM, no exudates, no oropharyngeal erythema or ulceration NECK: supple, no JVD LYMPH:  no palpable lymphadenopathy in the cervical, axillary or inguinal regions LUNGS: clear to auscultation b/l with normal respiratory  effort HEART: regular rate & rhythm ABDOMEN:  normoactive bowel sounds , non tender, not distended. Extremity: 1+ pedal edema PSYCH: alert & oriented x 3 with fluent speech NEURO: no focal motor/sensory deficits   LABORATORY DATA:  I have reviewed the data as listed  . CBC  Latest Ref Rng & Units 10/04/2017 09/06/2017 08/21/2017  WBC 4.0 - 10.3 K/uL 5.6 5.3 5.9  Hemoglobin 13.0 - 17.1 g/dL 9.1(L) 10.3(L) 8.7(L)  Hematocrit 38.4 - 49.9 % 29.1(L) 32.5(L) 27.8(L)  Platelets 140 - 400 K/uL 135(L) 129(L) 143   . CBC    Component Value Date/Time   WBC 5.6 10/04/2017 1259   RBC 2.93 (L) 10/04/2017 1259   RBC 2.93 (L) 10/04/2017 1259   HGB 9.1 (L) 10/04/2017 1259   HGB 10.3 (L) 09/06/2017 1131   HCT 29.1 (L) 10/04/2017 1259   HCT 21.9 (L) 07/20/2017 2103   PLT 135 (L) 10/04/2017 1259   PLT 129 (L) 09/06/2017 1131   MCV 99.3 (H) 10/04/2017 1259   MCH 31.1 10/04/2017 1259   MCHC 31.3 (L) 10/04/2017 1259   RDW 21.9 (H) 10/04/2017 1259   LYMPHSABS 1.9 10/04/2017 1259   MONOABS 0.6 10/04/2017 1259   EOSABS 0.1 10/04/2017 1259   BASOSABS 0.0 10/04/2017 1259     . CMP Latest Ref Rng & Units 10/04/2017 09/06/2017 08/21/2017  Glucose 70 - 140 mg/dL 112 104 105  BUN 7 - 26 mg/dL 20 29(H) 25  Creatinine 0.70 - 1.30 mg/dL 1.70(H) 2.00(H) 2.00(H)  Sodium 136 - 145 mmol/L 135(L) 138 137  Potassium 3.5 - 5.1 mmol/L 5.1 4.9 4.6  Chloride 98 - 109 mmol/L 108 111(H) 106  CO2 22 - 29 mmol/L 21(L) 22 20(L)  Calcium 8.4 - 10.4 mg/dL 7.7(L) 6.8(L) 7.2(L)  Total Protein 6.4 - 8.3 g/dL 6.6 6.9 7.0  Total Bilirubin 0.2 - 1.2 mg/dL 0.7 0.6 0.6  Alkaline Phos 40 - 150 U/L 494(H) 480(H) 547(H)  AST 5 - 34 U/L '18 16 17  '$ ALT 0 - 55 U/L '8 12 10   '$ Component     Latest Ref Rng & Units 07/30/2017 09/06/2017  Prostate Specific Ag, Serum     0.0 - 4.0 ng/mL 1,186.0 (H) 433.8 (H)    Component     Latest Ref Rng & Units 07/30/2017  IgG (Immunoglobin G), Serum     700 - 1,600 mg/dL 1,060  IgA     61 - 437 mg/dL 140  IgM (Immunoglobulin M), Srm     15 - 143 mg/dL 34  Total Protein ELP     6.0 - 8.5 g/dL 6.4  Albumin SerPl Elph-Mcnc     2.9 - 4.4 g/dL 3.3  Alpha 1     0.0 - 0.4 g/dL 0.3  Alpha2 Glob SerPl Elph-Mcnc     0.4 - 1.0 g/dL 1.0  B-Globulin SerPl Elph-Mcnc     0.7 - 1.3  g/dL 0.9  Gamma Glob SerPl Elph-Mcnc     0.4 - 1.8 g/dL 0.9  M Protein SerPl Elph-Mcnc     Not Observed g/dL Not Observed  Globulin, Total     2.2 - 3.9 g/dL 3.1  Albumin/Glob SerPl     0.7 - 1.7 1.1  IFE 1      Comment  Please Note (HCV):      Comment  Kappa free light chain     3.3 - 19.4 mg/L 41.6 (H)  Lamda free light chains     5.7 - 26.3 mg/L 29.6 (  H)  Kappa, lamda light chain ratio     0.26 - 1.65 1.41  Retic Ct Pct     0.8 - 1.8 % 2.1 (H)  RBC.     4.20 - 5.82 MIL/uL 3.23 (L)  Retic Count, Absolute     34.8 - 93.9 K/uL 67.8  Sed Rate     0 - 16 mm/hr 98 (H)  LDH     125 - 245 U/L 307 (H)  Prostate Specific Ag, Serum     0.0 - 4.0 ng/mL 1,186.0 (H)   07/31/17 BM Bx:   RADIOGRAPHIC STUDIES: I have personally reviewed the radiological images as listed and agreed with the findings in the report. Dg Hips Bilat With Pelvis 2v  Result Date: 09/07/2017 CLINICAL DATA:  Diffuse LEFT UPPER leg pain for 3 months. History of metastatic prostate cancer. EXAM: LEFT FEMUR 2 VIEWS; RIGHT FEMUR 2 VIEWS; DG HIP (WITH OR WITHOUT PELVIS) 2V BILAT COMPARISON:  08/13/2017 bone scan FINDINGS: No acute fracture, subluxation or dislocation. The bony metastases within the proximal-mid LEFT femur identified on recent bone scan are not apparent radiographically. Mild degenerative changes in the hip and knee noted. IMPRESSION: 1. No acute abnormality. 2. LEFT femur bony metastases identified on recent bone scan are not apparent radiographically. 3. Mild degenerative changes in the hip and knee. Electronically Signed   By: Margarette Canada M.D.   On: 09/07/2017 09:26   Dg Femur Min 2 Views Left  Result Date: 09/07/2017 CLINICAL DATA:  Diffuse LEFT UPPER leg pain for 3 months. History of metastatic prostate cancer. EXAM: LEFT FEMUR 2 VIEWS; RIGHT FEMUR 2 VIEWS; DG HIP (WITH OR WITHOUT PELVIS) 2V BILAT COMPARISON:  08/13/2017 bone scan FINDINGS: No acute fracture, subluxation or dislocation. The bony  metastases within the proximal-mid LEFT femur identified on recent bone scan are not apparent radiographically. Mild degenerative changes in the hip and knee noted. IMPRESSION: 1. No acute abnormality. 2. LEFT femur bony metastases identified on recent bone scan are not apparent radiographically. 3. Mild degenerative changes in the hip and knee. Electronically Signed   By: Margarette Canada M.D.   On: 09/07/2017 09:26   Dg Femur, Min 2 Views Right  Result Date: 09/07/2017 CLINICAL DATA:  Diffuse LEFT UPPER leg pain for 3 months. History of metastatic prostate cancer. EXAM: LEFT FEMUR 2 VIEWS; RIGHT FEMUR 2 VIEWS; DG HIP (WITH OR WITHOUT PELVIS) 2V BILAT COMPARISON:  08/13/2017 bone scan FINDINGS: No acute fracture, subluxation or dislocation. The bony metastases within the proximal-mid LEFT femur identified on recent bone scan are not apparent radiographically. Mild degenerative changes in the hip and knee noted. IMPRESSION: 1. No acute abnormality. 2. LEFT femur bony metastases identified on recent bone scan are not apparent radiographically. 3. Mild degenerative changes in the hip and knee. Electronically Signed   By: Margarette Canada M.D.   On: 09/07/2017 09:26    ASSESSMENT & PLAN:   84 y.o. male with  1. Normocytic Anemia - due to primarily metastatic prostatic cancer (ACD + BM involvement with prostate cancer causing myelopthisic picture) -Discussed patient's most recent labs, Hgb at 8.7 on 07/22/17. Now improved to 10.3. Did receive PRBC transfusion. -In review of the patient's CBC records, his anemia has developed in the last 6-7 months, unaccompanied by a drop in his EPO (07/20/17 elevated at 249.8);  LDH elevated  but haptoglobin at 357 on 07/20/17. -suggested against active hemolysis. Significantly increased sed rate. Myeloma panel - no overt evidence of plasma cell dyscrasia.  He has CKD but elevated EPO suggests against this being the primary etiology of his anemia. -he noted some urinary symptoms so  a PSA was done which is not noted to be significantly elevated - concerning for prostate cancer -Bone marrow biopsy- showed prominent involvement of the BM with metastatic carcinoma consistent with prostatic primary - causing Myelopthisic picture. -treatment will need to be directed towards his metastatic prostate cancer - recently diagnosed.  2. Recently diagnosed metastatic prostate cancer with extensive bone metastases. With bone mets/BM Mets and LNadenopathy. Exam positive for left retroperitoneal, bilateral pelvis and right inguinal adenopathy. Exam positive for left retroperitoneal, bilateral pelvis and right inguinal adenopathy. Diffuse sclerotic bone metastasis.   3. Elevated alkaline phosphatase due to bone metastases from prostate cancer -CT BM bx -concerning for significant bone lesions in lower spine and pelvis PSA levels 1186 now down to 433 and no down to 67.4  4. Cancer related pain - primarily in b/l thighs.  -Xray b/l femurs today showed no overt measure high risk for fracture lesions Plan: -Discussed pt labwork today, 10/04/17; Hgb is holding at 9.1, creatinine improved to 1.70.  -PSA has been decreasing from 1186 to 433, and is down to 67.4 -Continue Xgeva monthly, and Lupron q3 months -Discussed adding Abiraterone to his treatment given how well the pt has tolerated treatment thus far and to get faster and deep response given his extent of disease. -Provided supplemental information about Abiraterone - wants to think about it. -Ordering Fentanyl patch for uncontrolled leg pain - will start at 12.26mg/h -continue prn oxycodone -Suggested keeping a stable schedule to aide memory retention and orientation  -Will see pt back in 4 weeks with labs   -Regarding the patient's anemia, we will set up transfusions prn for hgb<8 -Vitamin D and Calcium supplements continue. Improved vit D levels with replacement (up from 14.1 to 49.8). -continue [facility] referral for PT,  RN home safety evaluation, DME [facility] High risk for decubitus ulcers and having difficult with thigh pains that limit mobility. -hospital bed -limited ambulation -   -Continue Xgeva q4 weeks -continue Lupron q12 weeks -RTC with Dr KIrene Limboin 4 weeks with labs   All of the patients questions were answered with apparent satisfaction. The patient knows to call the clinic with any problems, questions or concerns.  . The total time spent in the appointment was 30 minutes and more than 50% was on counseling and direct patient cares.        GSullivan LoneMD MS AAHIVMS SMarion Surgery Center LLCCAthol Memorial HospitalHematology/Oncology Physician CMerit Health [facility] (Office):       [phone](Work cell):  [phone](Fax):           [phone] 10/04/2017 3:10 PM  I, SBaldwin Jamaica am acting as a sEducation administratorfor Dr KIrene Limbo   .I have reviewed the above documentation for accuracy and completeness, and I agree with the above. .Brunetta GeneraMD MS

## 2017-10-03 ENCOUNTER — Telehealth: Payer: Self-pay

## 2017-10-03 DIAGNOSIS — Z7902 Long term (current) use of antithrombotics/antiplatelets: Secondary | ICD-10-CM | POA: Diagnosis not present

## 2017-10-03 DIAGNOSIS — N189 Chronic kidney disease, unspecified: Secondary | ICD-10-CM | POA: Diagnosis not present

## 2017-10-03 DIAGNOSIS — D6182 Myelophthisis: Secondary | ICD-10-CM | POA: Diagnosis not present

## 2017-10-03 DIAGNOSIS — M109 Gout, unspecified: Secondary | ICD-10-CM | POA: Diagnosis not present

## 2017-10-03 DIAGNOSIS — C7951 Secondary malignant neoplasm of bone: Secondary | ICD-10-CM | POA: Diagnosis not present

## 2017-10-03 DIAGNOSIS — I129 Hypertensive chronic kidney disease with stage 1 through stage 4 chronic kidney disease, or unspecified chronic kidney disease: Secondary | ICD-10-CM | POA: Diagnosis not present

## 2017-10-03 DIAGNOSIS — C7952 Secondary malignant neoplasm of bone marrow: Secondary | ICD-10-CM | POA: Diagnosis not present

## 2017-10-03 DIAGNOSIS — Z955 Presence of coronary angioplasty implant and graft: Secondary | ICD-10-CM | POA: Diagnosis not present

## 2017-10-03 DIAGNOSIS — I252 Old myocardial infarction: Secondary | ICD-10-CM | POA: Diagnosis not present

## 2017-10-03 DIAGNOSIS — D630 Anemia in neoplastic disease: Secondary | ICD-10-CM | POA: Diagnosis not present

## 2017-10-03 DIAGNOSIS — C61 Malignant neoplasm of prostate: Secondary | ICD-10-CM | POA: Diagnosis not present

## 2017-10-03 DIAGNOSIS — Z79891 Long term (current) use of opiate analgesic: Secondary | ICD-10-CM | POA: Diagnosis not present

## 2017-10-03 DIAGNOSIS — Z8673 Personal history of transient ischemic attack (TIA), and cerebral infarction without residual deficits: Secondary | ICD-10-CM | POA: Diagnosis not present

## 2017-10-03 DIAGNOSIS — D509 Iron deficiency anemia, unspecified: Secondary | ICD-10-CM | POA: Diagnosis not present

## 2017-10-03 DIAGNOSIS — R350 Frequency of micturition: Secondary | ICD-10-CM | POA: Diagnosis not present

## 2017-10-03 DIAGNOSIS — D539 Nutritional anemia, unspecified: Secondary | ICD-10-CM | POA: Diagnosis not present

## 2017-10-03 DIAGNOSIS — R3915 Urgency of urination: Secondary | ICD-10-CM | POA: Diagnosis not present

## 2017-10-03 DIAGNOSIS — E785 Hyperlipidemia, unspecified: Secondary | ICD-10-CM | POA: Diagnosis not present

## 2017-10-03 DIAGNOSIS — I25119 Atherosclerotic heart disease of native coronary artery with unspecified angina pectoris: Secondary | ICD-10-CM | POA: Diagnosis not present

## 2017-10-03 DIAGNOSIS — G893 Neoplasm related pain (acute) (chronic): Secondary | ICD-10-CM | POA: Diagnosis not present

## 2017-10-03 DIAGNOSIS — R590 Localized enlarged lymph nodes: Secondary | ICD-10-CM | POA: Diagnosis not present

## 2017-10-03 DIAGNOSIS — N401 Enlarged prostate with lower urinary tract symptoms: Secondary | ICD-10-CM | POA: Diagnosis not present

## 2017-10-03 DIAGNOSIS — I700 Atherosclerosis of aorta: Secondary | ICD-10-CM | POA: Diagnosis not present

## 2017-10-03 NOTE — Telephone Encounter (Signed)
Fax sent to Jerel Shepherd at [facility] with signed orders for pt [phone]. Confirmed fax receipt 10/03/17 at 1633.

## 2017-10-03 NOTE — Telephone Encounter (Signed)
Verbal order given to Herbert Deaner, PT for speech therapy to aid with communication and evaluation for safety. Additional PT once weekly for 4 weeks approved by Dr. [name]. Pt visit in clinic tomorrow.

## 2017-10-04 ENCOUNTER — Encounter: Payer: Self-pay | Admitting: Hematology

## 2017-10-04 ENCOUNTER — Telehealth: Payer: Self-pay | Admitting: Hematology

## 2017-10-04 ENCOUNTER — Inpatient Hospital Stay: Payer: Medicare Other | Attending: Hematology | Admitting: Hematology

## 2017-10-04 ENCOUNTER — Inpatient Hospital Stay: Payer: Medicare Other

## 2017-10-04 VITALS — BP 137/70 | HR 80 | Temp 98.0°F | Resp 18 | Ht 67.0 in | Wt 186.1 lb

## 2017-10-04 DIAGNOSIS — C61 Malignant neoplasm of prostate: Secondary | ICD-10-CM

## 2017-10-04 DIAGNOSIS — G893 Neoplasm related pain (acute) (chronic): Secondary | ICD-10-CM | POA: Insufficient documentation

## 2017-10-04 DIAGNOSIS — C7951 Secondary malignant neoplasm of bone: Secondary | ICD-10-CM | POA: Diagnosis not present

## 2017-10-04 DIAGNOSIS — N189 Chronic kidney disease, unspecified: Secondary | ICD-10-CM | POA: Diagnosis not present

## 2017-10-04 DIAGNOSIS — D649 Anemia, unspecified: Secondary | ICD-10-CM | POA: Insufficient documentation

## 2017-10-04 DIAGNOSIS — D6182 Myelophthisis: Secondary | ICD-10-CM

## 2017-10-04 DIAGNOSIS — R590 Localized enlarged lymph nodes: Secondary | ICD-10-CM

## 2017-10-04 DIAGNOSIS — R748 Abnormal levels of other serum enzymes: Secondary | ICD-10-CM

## 2017-10-04 DIAGNOSIS — I129 Hypertensive chronic kidney disease with stage 1 through stage 4 chronic kidney disease, or unspecified chronic kidney disease: Secondary | ICD-10-CM | POA: Diagnosis not present

## 2017-10-04 LAB — CMP (CANCER CENTER ONLY)
ALK PHOS: 494 U/L — AB (ref 40–150)
ALT: 8 U/L (ref 0–55)
AST: 18 U/L (ref 5–34)
Albumin: 3.4 g/dL — ABNORMAL LOW (ref 3.5–5.0)
Anion gap: 6 (ref 3–11)
BILIRUBIN TOTAL: 0.7 mg/dL (ref 0.2–1.2)
BUN: 20 mg/dL (ref 7–26)
CO2: 21 mmol/L — ABNORMAL LOW (ref 22–29)
CREATININE: 1.7 mg/dL — AB (ref 0.70–1.30)
Calcium: 7.7 mg/dL — ABNORMAL LOW (ref 8.4–10.4)
Chloride: 108 mmol/L (ref 98–109)
GFR, EST NON AFRICAN AMERICAN: 35 mL/min — AB (ref >60)
GFR, Est AFR Am: 41 mL/min — ABNORMAL LOW (ref >60)
Glucose, Bld: 112 mg/dL (ref 70–140)
Potassium: 5.1 mmol/L (ref 3.5–5.1)
Sodium: 135 mmol/L — ABNORMAL LOW (ref 136–145)
TOTAL PROTEIN: 6.6 g/dL (ref 6.4–8.3)

## 2017-10-04 LAB — CBC WITH DIFFERENTIAL/PLATELET
BASOS ABS: 0 10*3/uL (ref 0.0–0.1)
Basophils Relative: 0 %
Eosinophils Absolute: 0.1 10*3/uL (ref 0.0–0.5)
Eosinophils Relative: 1 %
HEMATOCRIT: 29.1 % — AB (ref 38.4–49.9)
HEMOGLOBIN: 9.1 g/dL — AB (ref 13.0–17.1)
Lymphocytes Relative: 34 %
Lymphs Abs: 1.9 10*3/uL (ref 0.9–3.3)
MCH: 31.1 pg (ref 27.2–33.4)
MCHC: 31.3 g/dL — ABNORMAL LOW (ref 32.0–36.0)
MCV: 99.3 fL — AB (ref 79.3–98.0)
Monocytes Absolute: 0.6 10*3/uL (ref 0.1–0.9)
Monocytes Relative: 10 %
NEUTROS ABS: 3.1 10*3/uL (ref 1.5–6.5)
Neutrophils Relative %: 55 %
Platelets: 135 10*3/uL — ABNORMAL LOW (ref 140–400)
RBC: 2.93 MIL/uL — AB (ref 4.20–5.82)
RDW: 21.9 % — ABNORMAL HIGH (ref 11.0–14.6)
WBC: 5.6 10*3/uL (ref 4.0–10.3)

## 2017-10-04 LAB — RETICULOCYTES
RBC.: 2.93 MIL/uL — ABNORMAL LOW (ref 4.20–5.82)
Retic Count, Absolute: 82 10*3/uL (ref 34.8–93.9)
Retic Ct Pct: 2.8 % — ABNORMAL HIGH (ref 0.8–1.8)

## 2017-10-04 MED ORDER — FENTANYL 12 MCG/HR TD PT72: 12.0000 ug | MEDICATED_PATCH | TRANSDERMAL | 0 refills | Status: DC

## 2017-10-04 MED ORDER — DENOSUMAB 120 MG/1.7ML ~~LOC~~ SOLN
SUBCUTANEOUS | Status: AC
  Filled 2017-10-04: qty 1.7

## 2017-10-04 MED ORDER — DENOSUMAB 120 MG/1.7ML ~~LOC~~ SOLN
120.0000 mg | Freq: Once | SUBCUTANEOUS | Status: AC
  Administered 2017-10-04: 120 mg via SUBCUTANEOUS

## 2017-10-04 MED ORDER — CALCIUM CARBONATE ANTACID 500 MG PO CHEW
2.0000 | CHEWABLE_TABLET | Freq: Once | ORAL | Status: AC
  Administered 2017-10-04: 400 mg via ORAL
  Filled 2017-10-04: qty 2

## 2017-10-04 NOTE — Telephone Encounter (Signed)
apts already scheduled per 6/6 los. -

## 2017-10-04 NOTE — Progress Notes (Signed)
Dr. [name] aware of current calcium. Dr. [name] advised ok to proceed with denosumab today, and advised to have patient take two Tums daily x 1 week beginning today. Patient aware and verbalizes understanding.

## 2017-10-04 NOTE — Patient Instructions (Signed)
Take 2 Tums daily for one week.  Begin today.      Denosumab injection What is this medicine? DENOSUMAB (den oh sue mab) slows bone breakdown. Prolia is used to treat osteoporosis in women after menopause and in men. Delton See is used to treat a high calcium level due to cancer and to prevent bone fractures and other bone problems caused by multiple myeloma or cancer bone metastases. Delton See is also used to treat giant cell tumor of the bone. This medicine may be used for other purposes; ask your health care provider or pharmacist if you have questions. COMMON BRAND NAME(S): Prolia, XGEVA What should I tell my health care provider before I take this medicine? They need to know if you have any of these conditions: -dental disease -having surgery or tooth extraction -infection -kidney disease -low levels of calcium or Vitamin D in the blood -malnutrition -on hemodialysis -skin conditions or sensitivity -thyroid or parathyroid disease -an unusual reaction to denosumab, other medicines, foods, dyes, or preservatives -pregnant or trying to get pregnant -breast-feeding How should I use this medicine? This medicine is for injection under the skin. It is given by a health care professional in a hospital or clinic setting. If you are getting Prolia, a special MedGuide will be given to you by the pharmacist with each prescription and refill. Be sure to read this information carefully each time. For Prolia, talk to your pediatrician regarding the use of this medicine in children. Special care may be needed. For Delton See, talk to your pediatrician regarding the use of this medicine in children. While this drug may be prescribed for children as young as 13 years for selected conditions, precautions do apply. Overdosage: If you think you have taken too much of this medicine contact a poison control center or emergency room at once. NOTE: This medicine is only for you. Do not share this medicine with  others. What if I miss a dose? It is important not to miss your dose. Call your doctor or health care professional if you are unable to keep an appointment. What may interact with this medicine? Do not take this medicine with any of the following medications: -other medicines containing denosumab This medicine may also interact with the following medications: -medicines that lower your chance of fighting infection -steroid medicines like prednisone or cortisone This list may not describe all possible interactions. Give your health care provider a list of all the medicines, herbs, non-prescription drugs, or dietary supplements you use. Also tell them if you smoke, drink alcohol, or use illegal drugs. Some items may interact with your medicine. What should I watch for while using this medicine? Visit your doctor or health care professional for regular checks on your progress. Your doctor or health care professional may order blood tests and other tests to see how you are doing. Call your doctor or health care professional for advice if you get a fever, chills or sore throat, or other symptoms of a cold or flu. Do not treat yourself. This drug may decrease your body's ability to fight infection. Try to avoid being around people who are sick. You should make sure you get enough calcium and vitamin D while you are taking this medicine, unless your doctor tells you not to. Discuss the foods you eat and the vitamins you take with your health care professional. See your dentist regularly. Brush and floss your teeth as directed. Before you have any dental work done, tell your dentist you are receiving this  medicine. Do not become pregnant while taking this medicine or for 5 months after stopping it. Talk with your doctor or health care professional about your birth control options while taking this medicine. Women should inform their doctor if they wish to become pregnant or think they might be pregnant. There  is a potential for serious side effects to an unborn child. Talk to your health care professional or pharmacist for more information. What side effects may I notice from receiving this medicine? Side effects that you should report to your doctor or health care professional as soon as possible: -allergic reactions like skin rash, itching or hives, swelling of the face, lips, or tongue -bone pain -breathing problems -dizziness -jaw pain, especially after dental work -redness, blistering, peeling of the skin -signs and symptoms of infection like fever or chills; cough; sore throat; pain or trouble passing urine -signs of low calcium like fast heartbeat, muscle cramps or muscle pain; pain, tingling, numbness in the hands or feet; seizures -unusual bleeding or bruising -unusually weak or tired Side effects that usually do not require medical attention (report to your doctor or health care professional if they continue or are bothersome): -constipation -diarrhea -headache -joint pain -loss of appetite -muscle pain -runny nose -tiredness -upset stomach This list may not describe all possible side effects. Call your doctor for medical advice about side effects. You may report side effects to FDA at 1-800-FDA-1088. Where should I keep my medicine? This medicine is only given in a clinic, doctor's office, or other health care setting and will not be stored at home. NOTE: This sheet is a summary. It may not cover all possible information. If you have questions about this medicine, talk to your doctor, pharmacist, or health care provider.  2018 [employer] Standard (2016-05-09 19:17:21)

## 2017-10-05 LAB — VITAMIN D 25 HYDROXY (VIT D DEFICIENCY, FRACTURES): Vit D, 25-Hydroxy: 49.8 ng/mL (ref 30.0–100.0)

## 2017-10-05 LAB — PROSTATE-SPECIFIC AG, SERUM (LABCORP): Prostate Specific Ag, Serum: 67.4 ng/mL — ABNORMAL HIGH (ref 0.0–4.0)

## 2017-10-10 DIAGNOSIS — D539 Nutritional anemia, unspecified: Secondary | ICD-10-CM | POA: Diagnosis not present

## 2017-10-10 DIAGNOSIS — G893 Neoplasm related pain (acute) (chronic): Secondary | ICD-10-CM | POA: Diagnosis not present

## 2017-10-10 DIAGNOSIS — Z955 Presence of coronary angioplasty implant and graft: Secondary | ICD-10-CM | POA: Diagnosis not present

## 2017-10-10 DIAGNOSIS — Z7902 Long term (current) use of antithrombotics/antiplatelets: Secondary | ICD-10-CM | POA: Diagnosis not present

## 2017-10-10 DIAGNOSIS — D6182 Myelophthisis: Secondary | ICD-10-CM | POA: Diagnosis not present

## 2017-10-10 DIAGNOSIS — C7951 Secondary malignant neoplasm of bone: Secondary | ICD-10-CM | POA: Diagnosis not present

## 2017-10-10 DIAGNOSIS — C61 Malignant neoplasm of prostate: Secondary | ICD-10-CM | POA: Diagnosis not present

## 2017-10-10 DIAGNOSIS — N189 Chronic kidney disease, unspecified: Secondary | ICD-10-CM | POA: Diagnosis not present

## 2017-10-10 DIAGNOSIS — Z8673 Personal history of transient ischemic attack (TIA), and cerebral infarction without residual deficits: Secondary | ICD-10-CM | POA: Diagnosis not present

## 2017-10-10 DIAGNOSIS — I700 Atherosclerosis of aorta: Secondary | ICD-10-CM | POA: Diagnosis not present

## 2017-10-10 DIAGNOSIS — M109 Gout, unspecified: Secondary | ICD-10-CM | POA: Diagnosis not present

## 2017-10-10 DIAGNOSIS — I129 Hypertensive chronic kidney disease with stage 1 through stage 4 chronic kidney disease, or unspecified chronic kidney disease: Secondary | ICD-10-CM | POA: Diagnosis not present

## 2017-10-10 DIAGNOSIS — I252 Old myocardial infarction: Secondary | ICD-10-CM | POA: Diagnosis not present

## 2017-10-10 DIAGNOSIS — R350 Frequency of micturition: Secondary | ICD-10-CM | POA: Diagnosis not present

## 2017-10-10 DIAGNOSIS — R590 Localized enlarged lymph nodes: Secondary | ICD-10-CM | POA: Diagnosis not present

## 2017-10-10 DIAGNOSIS — I25119 Atherosclerotic heart disease of native coronary artery with unspecified angina pectoris: Secondary | ICD-10-CM | POA: Diagnosis not present

## 2017-10-10 DIAGNOSIS — N401 Enlarged prostate with lower urinary tract symptoms: Secondary | ICD-10-CM | POA: Diagnosis not present

## 2017-10-10 DIAGNOSIS — D509 Iron deficiency anemia, unspecified: Secondary | ICD-10-CM | POA: Diagnosis not present

## 2017-10-10 DIAGNOSIS — Z79891 Long term (current) use of opiate analgesic: Secondary | ICD-10-CM | POA: Diagnosis not present

## 2017-10-10 DIAGNOSIS — R3915 Urgency of urination: Secondary | ICD-10-CM | POA: Diagnosis not present

## 2017-10-10 DIAGNOSIS — D630 Anemia in neoplastic disease: Secondary | ICD-10-CM | POA: Diagnosis not present

## 2017-10-10 DIAGNOSIS — C7952 Secondary malignant neoplasm of bone marrow: Secondary | ICD-10-CM | POA: Diagnosis not present

## 2017-10-10 DIAGNOSIS — E785 Hyperlipidemia, unspecified: Secondary | ICD-10-CM | POA: Diagnosis not present

## 2017-10-11 DIAGNOSIS — I252 Old myocardial infarction: Secondary | ICD-10-CM | POA: Diagnosis not present

## 2017-10-11 DIAGNOSIS — D630 Anemia in neoplastic disease: Secondary | ICD-10-CM | POA: Diagnosis not present

## 2017-10-11 DIAGNOSIS — I700 Atherosclerosis of aorta: Secondary | ICD-10-CM | POA: Diagnosis not present

## 2017-10-11 DIAGNOSIS — D509 Iron deficiency anemia, unspecified: Secondary | ICD-10-CM | POA: Diagnosis not present

## 2017-10-11 DIAGNOSIS — C61 Malignant neoplasm of prostate: Secondary | ICD-10-CM | POA: Diagnosis not present

## 2017-10-11 DIAGNOSIS — M109 Gout, unspecified: Secondary | ICD-10-CM | POA: Diagnosis not present

## 2017-10-11 DIAGNOSIS — R590 Localized enlarged lymph nodes: Secondary | ICD-10-CM | POA: Diagnosis not present

## 2017-10-11 DIAGNOSIS — C7952 Secondary malignant neoplasm of bone marrow: Secondary | ICD-10-CM | POA: Diagnosis not present

## 2017-10-11 DIAGNOSIS — D539 Nutritional anemia, unspecified: Secondary | ICD-10-CM | POA: Diagnosis not present

## 2017-10-11 DIAGNOSIS — R350 Frequency of micturition: Secondary | ICD-10-CM | POA: Diagnosis not present

## 2017-10-11 DIAGNOSIS — E785 Hyperlipidemia, unspecified: Secondary | ICD-10-CM | POA: Diagnosis not present

## 2017-10-11 DIAGNOSIS — Z79891 Long term (current) use of opiate analgesic: Secondary | ICD-10-CM | POA: Diagnosis not present

## 2017-10-11 DIAGNOSIS — R3915 Urgency of urination: Secondary | ICD-10-CM | POA: Diagnosis not present

## 2017-10-11 DIAGNOSIS — N401 Enlarged prostate with lower urinary tract symptoms: Secondary | ICD-10-CM | POA: Diagnosis not present

## 2017-10-11 DIAGNOSIS — Z7902 Long term (current) use of antithrombotics/antiplatelets: Secondary | ICD-10-CM | POA: Diagnosis not present

## 2017-10-11 DIAGNOSIS — N189 Chronic kidney disease, unspecified: Secondary | ICD-10-CM | POA: Diagnosis not present

## 2017-10-11 DIAGNOSIS — D6182 Myelophthisis: Secondary | ICD-10-CM | POA: Diagnosis not present

## 2017-10-11 DIAGNOSIS — I129 Hypertensive chronic kidney disease with stage 1 through stage 4 chronic kidney disease, or unspecified chronic kidney disease: Secondary | ICD-10-CM | POA: Diagnosis not present

## 2017-10-11 DIAGNOSIS — G893 Neoplasm related pain (acute) (chronic): Secondary | ICD-10-CM | POA: Diagnosis not present

## 2017-10-11 DIAGNOSIS — I25119 Atherosclerotic heart disease of native coronary artery with unspecified angina pectoris: Secondary | ICD-10-CM | POA: Diagnosis not present

## 2017-10-11 DIAGNOSIS — Z8673 Personal history of transient ischemic attack (TIA), and cerebral infarction without residual deficits: Secondary | ICD-10-CM | POA: Diagnosis not present

## 2017-10-11 DIAGNOSIS — C7951 Secondary malignant neoplasm of bone: Secondary | ICD-10-CM | POA: Diagnosis not present

## 2017-10-11 DIAGNOSIS — Z955 Presence of coronary angioplasty implant and graft: Secondary | ICD-10-CM | POA: Diagnosis not present

## 2017-10-15 ENCOUNTER — Telehealth: Payer: Self-pay | Admitting: Family Medicine

## 2017-10-15 NOTE — Telephone Encounter (Signed)
Verbal orders given  

## 2017-10-15 NOTE — Telephone Encounter (Signed)
See note

## 2017-10-15 NOTE — Telephone Encounter (Signed)
Copied from [place] [id number]. Topic: Quick Communication - See Telephone Encounter >> Oct 15, 2017  3:33 PM Neva Seat wrote: Alonna Minium, Speech Therapist  - [place] - [phone]  Verbal Orders: 1 time a week for 3 weeks - cognitive changes

## 2017-10-16 ENCOUNTER — Other Ambulatory Visit: Payer: Self-pay | Admitting: Hematology

## 2017-10-16 DIAGNOSIS — C61 Malignant neoplasm of prostate: Secondary | ICD-10-CM | POA: Diagnosis not present

## 2017-10-16 DIAGNOSIS — M109 Gout, unspecified: Secondary | ICD-10-CM | POA: Diagnosis not present

## 2017-10-16 DIAGNOSIS — C7952 Secondary malignant neoplasm of bone marrow: Secondary | ICD-10-CM | POA: Diagnosis not present

## 2017-10-16 DIAGNOSIS — I739 Peripheral vascular disease, unspecified: Secondary | ICD-10-CM

## 2017-10-16 DIAGNOSIS — E785 Hyperlipidemia, unspecified: Secondary | ICD-10-CM | POA: Diagnosis not present

## 2017-10-16 DIAGNOSIS — I129 Hypertensive chronic kidney disease with stage 1 through stage 4 chronic kidney disease, or unspecified chronic kidney disease: Secondary | ICD-10-CM | POA: Diagnosis not present

## 2017-10-16 DIAGNOSIS — R350 Frequency of micturition: Secondary | ICD-10-CM | POA: Diagnosis not present

## 2017-10-16 DIAGNOSIS — R590 Localized enlarged lymph nodes: Secondary | ICD-10-CM | POA: Diagnosis not present

## 2017-10-16 DIAGNOSIS — N189 Chronic kidney disease, unspecified: Secondary | ICD-10-CM | POA: Diagnosis not present

## 2017-10-16 DIAGNOSIS — Z955 Presence of coronary angioplasty implant and graft: Secondary | ICD-10-CM | POA: Diagnosis not present

## 2017-10-16 DIAGNOSIS — I252 Old myocardial infarction: Secondary | ICD-10-CM | POA: Diagnosis not present

## 2017-10-16 DIAGNOSIS — I700 Atherosclerosis of aorta: Secondary | ICD-10-CM | POA: Diagnosis not present

## 2017-10-16 DIAGNOSIS — N401 Enlarged prostate with lower urinary tract symptoms: Secondary | ICD-10-CM | POA: Diagnosis not present

## 2017-10-16 DIAGNOSIS — R3915 Urgency of urination: Secondary | ICD-10-CM | POA: Diagnosis not present

## 2017-10-16 DIAGNOSIS — Z79891 Long term (current) use of opiate analgesic: Secondary | ICD-10-CM | POA: Diagnosis not present

## 2017-10-16 DIAGNOSIS — Z8673 Personal history of transient ischemic attack (TIA), and cerebral infarction without residual deficits: Secondary | ICD-10-CM | POA: Diagnosis not present

## 2017-10-16 DIAGNOSIS — D509 Iron deficiency anemia, unspecified: Secondary | ICD-10-CM | POA: Diagnosis not present

## 2017-10-16 DIAGNOSIS — Z7902 Long term (current) use of antithrombotics/antiplatelets: Secondary | ICD-10-CM | POA: Diagnosis not present

## 2017-10-16 DIAGNOSIS — C7951 Secondary malignant neoplasm of bone: Secondary | ICD-10-CM | POA: Diagnosis not present

## 2017-10-16 DIAGNOSIS — D539 Nutritional anemia, unspecified: Secondary | ICD-10-CM | POA: Diagnosis not present

## 2017-10-16 DIAGNOSIS — G893 Neoplasm related pain (acute) (chronic): Secondary | ICD-10-CM | POA: Diagnosis not present

## 2017-10-16 DIAGNOSIS — I25119 Atherosclerotic heart disease of native coronary artery with unspecified angina pectoris: Secondary | ICD-10-CM | POA: Diagnosis not present

## 2017-10-16 DIAGNOSIS — D6182 Myelophthisis: Secondary | ICD-10-CM | POA: Diagnosis not present

## 2017-10-16 DIAGNOSIS — D630 Anemia in neoplastic disease: Secondary | ICD-10-CM | POA: Diagnosis not present

## 2017-10-17 DIAGNOSIS — R3915 Urgency of urination: Secondary | ICD-10-CM | POA: Diagnosis not present

## 2017-10-17 DIAGNOSIS — Z7902 Long term (current) use of antithrombotics/antiplatelets: Secondary | ICD-10-CM | POA: Diagnosis not present

## 2017-10-17 DIAGNOSIS — E785 Hyperlipidemia, unspecified: Secondary | ICD-10-CM | POA: Diagnosis not present

## 2017-10-17 DIAGNOSIS — Z8673 Personal history of transient ischemic attack (TIA), and cerebral infarction without residual deficits: Secondary | ICD-10-CM | POA: Diagnosis not present

## 2017-10-17 DIAGNOSIS — D509 Iron deficiency anemia, unspecified: Secondary | ICD-10-CM | POA: Diagnosis not present

## 2017-10-17 DIAGNOSIS — I252 Old myocardial infarction: Secondary | ICD-10-CM | POA: Diagnosis not present

## 2017-10-17 DIAGNOSIS — C7951 Secondary malignant neoplasm of bone: Secondary | ICD-10-CM | POA: Diagnosis not present

## 2017-10-17 DIAGNOSIS — I700 Atherosclerosis of aorta: Secondary | ICD-10-CM | POA: Diagnosis not present

## 2017-10-17 DIAGNOSIS — R350 Frequency of micturition: Secondary | ICD-10-CM | POA: Diagnosis not present

## 2017-10-17 DIAGNOSIS — Z79891 Long term (current) use of opiate analgesic: Secondary | ICD-10-CM | POA: Diagnosis not present

## 2017-10-17 DIAGNOSIS — M109 Gout, unspecified: Secondary | ICD-10-CM | POA: Diagnosis not present

## 2017-10-17 DIAGNOSIS — G893 Neoplasm related pain (acute) (chronic): Secondary | ICD-10-CM | POA: Diagnosis not present

## 2017-10-17 DIAGNOSIS — N189 Chronic kidney disease, unspecified: Secondary | ICD-10-CM | POA: Diagnosis not present

## 2017-10-17 DIAGNOSIS — R590 Localized enlarged lymph nodes: Secondary | ICD-10-CM | POA: Diagnosis not present

## 2017-10-17 DIAGNOSIS — N401 Enlarged prostate with lower urinary tract symptoms: Secondary | ICD-10-CM | POA: Diagnosis not present

## 2017-10-17 DIAGNOSIS — Z955 Presence of coronary angioplasty implant and graft: Secondary | ICD-10-CM | POA: Diagnosis not present

## 2017-10-17 DIAGNOSIS — D539 Nutritional anemia, unspecified: Secondary | ICD-10-CM | POA: Diagnosis not present

## 2017-10-17 DIAGNOSIS — D6182 Myelophthisis: Secondary | ICD-10-CM | POA: Diagnosis not present

## 2017-10-17 DIAGNOSIS — D630 Anemia in neoplastic disease: Secondary | ICD-10-CM | POA: Diagnosis not present

## 2017-10-17 DIAGNOSIS — C61 Malignant neoplasm of prostate: Secondary | ICD-10-CM | POA: Diagnosis not present

## 2017-10-17 DIAGNOSIS — I25119 Atherosclerotic heart disease of native coronary artery with unspecified angina pectoris: Secondary | ICD-10-CM | POA: Diagnosis not present

## 2017-10-17 DIAGNOSIS — I129 Hypertensive chronic kidney disease with stage 1 through stage 4 chronic kidney disease, or unspecified chronic kidney disease: Secondary | ICD-10-CM | POA: Diagnosis not present

## 2017-10-17 DIAGNOSIS — C7952 Secondary malignant neoplasm of bone marrow: Secondary | ICD-10-CM | POA: Diagnosis not present

## 2017-10-19 DIAGNOSIS — D630 Anemia in neoplastic disease: Secondary | ICD-10-CM | POA: Diagnosis not present

## 2017-10-19 DIAGNOSIS — Z8673 Personal history of transient ischemic attack (TIA), and cerebral infarction without residual deficits: Secondary | ICD-10-CM | POA: Diagnosis not present

## 2017-10-19 DIAGNOSIS — Z79891 Long term (current) use of opiate analgesic: Secondary | ICD-10-CM | POA: Diagnosis not present

## 2017-10-19 DIAGNOSIS — D6182 Myelophthisis: Secondary | ICD-10-CM | POA: Diagnosis not present

## 2017-10-19 DIAGNOSIS — N189 Chronic kidney disease, unspecified: Secondary | ICD-10-CM | POA: Diagnosis not present

## 2017-10-19 DIAGNOSIS — C61 Malignant neoplasm of prostate: Secondary | ICD-10-CM | POA: Diagnosis not present

## 2017-10-19 DIAGNOSIS — I129 Hypertensive chronic kidney disease with stage 1 through stage 4 chronic kidney disease, or unspecified chronic kidney disease: Secondary | ICD-10-CM | POA: Diagnosis not present

## 2017-10-19 DIAGNOSIS — D539 Nutritional anemia, unspecified: Secondary | ICD-10-CM | POA: Diagnosis not present

## 2017-10-19 DIAGNOSIS — R350 Frequency of micturition: Secondary | ICD-10-CM | POA: Diagnosis not present

## 2017-10-19 DIAGNOSIS — Z955 Presence of coronary angioplasty implant and graft: Secondary | ICD-10-CM | POA: Diagnosis not present

## 2017-10-19 DIAGNOSIS — E785 Hyperlipidemia, unspecified: Secondary | ICD-10-CM | POA: Diagnosis not present

## 2017-10-19 DIAGNOSIS — Z7902 Long term (current) use of antithrombotics/antiplatelets: Secondary | ICD-10-CM | POA: Diagnosis not present

## 2017-10-19 DIAGNOSIS — C7951 Secondary malignant neoplasm of bone: Secondary | ICD-10-CM | POA: Diagnosis not present

## 2017-10-19 DIAGNOSIS — M109 Gout, unspecified: Secondary | ICD-10-CM | POA: Diagnosis not present

## 2017-10-19 DIAGNOSIS — G893 Neoplasm related pain (acute) (chronic): Secondary | ICD-10-CM | POA: Diagnosis not present

## 2017-10-19 DIAGNOSIS — D509 Iron deficiency anemia, unspecified: Secondary | ICD-10-CM | POA: Diagnosis not present

## 2017-10-19 DIAGNOSIS — R590 Localized enlarged lymph nodes: Secondary | ICD-10-CM | POA: Diagnosis not present

## 2017-10-19 DIAGNOSIS — I25119 Atherosclerotic heart disease of native coronary artery with unspecified angina pectoris: Secondary | ICD-10-CM | POA: Diagnosis not present

## 2017-10-19 DIAGNOSIS — I252 Old myocardial infarction: Secondary | ICD-10-CM | POA: Diagnosis not present

## 2017-10-19 DIAGNOSIS — I700 Atherosclerosis of aorta: Secondary | ICD-10-CM | POA: Diagnosis not present

## 2017-10-19 DIAGNOSIS — C7952 Secondary malignant neoplasm of bone marrow: Secondary | ICD-10-CM | POA: Diagnosis not present

## 2017-10-19 DIAGNOSIS — N401 Enlarged prostate with lower urinary tract symptoms: Secondary | ICD-10-CM | POA: Diagnosis not present

## 2017-10-19 DIAGNOSIS — R3915 Urgency of urination: Secondary | ICD-10-CM | POA: Diagnosis not present

## 2017-10-21 DIAGNOSIS — R0600 Dyspnea, unspecified: Secondary | ICD-10-CM | POA: Diagnosis not present

## 2017-10-21 DIAGNOSIS — R0609 Other forms of dyspnea: Secondary | ICD-10-CM | POA: Diagnosis not present

## 2017-10-21 DIAGNOSIS — C61 Malignant neoplasm of prostate: Secondary | ICD-10-CM | POA: Diagnosis not present

## 2017-10-24 DIAGNOSIS — I129 Hypertensive chronic kidney disease with stage 1 through stage 4 chronic kidney disease, or unspecified chronic kidney disease: Secondary | ICD-10-CM | POA: Diagnosis not present

## 2017-10-24 DIAGNOSIS — D6182 Myelophthisis: Secondary | ICD-10-CM | POA: Diagnosis not present

## 2017-10-24 DIAGNOSIS — Z8673 Personal history of transient ischemic attack (TIA), and cerebral infarction without residual deficits: Secondary | ICD-10-CM | POA: Diagnosis not present

## 2017-10-24 DIAGNOSIS — I25119 Atherosclerotic heart disease of native coronary artery with unspecified angina pectoris: Secondary | ICD-10-CM | POA: Diagnosis not present

## 2017-10-24 DIAGNOSIS — G893 Neoplasm related pain (acute) (chronic): Secondary | ICD-10-CM | POA: Diagnosis not present

## 2017-10-24 DIAGNOSIS — D630 Anemia in neoplastic disease: Secondary | ICD-10-CM | POA: Diagnosis not present

## 2017-10-24 DIAGNOSIS — C61 Malignant neoplasm of prostate: Secondary | ICD-10-CM | POA: Diagnosis not present

## 2017-10-24 DIAGNOSIS — D509 Iron deficiency anemia, unspecified: Secondary | ICD-10-CM | POA: Diagnosis not present

## 2017-10-24 DIAGNOSIS — R3915 Urgency of urination: Secondary | ICD-10-CM | POA: Diagnosis not present

## 2017-10-24 DIAGNOSIS — Z955 Presence of coronary angioplasty implant and graft: Secondary | ICD-10-CM | POA: Diagnosis not present

## 2017-10-24 DIAGNOSIS — N189 Chronic kidney disease, unspecified: Secondary | ICD-10-CM | POA: Diagnosis not present

## 2017-10-24 DIAGNOSIS — Z79891 Long term (current) use of opiate analgesic: Secondary | ICD-10-CM | POA: Diagnosis not present

## 2017-10-24 DIAGNOSIS — C7952 Secondary malignant neoplasm of bone marrow: Secondary | ICD-10-CM | POA: Diagnosis not present

## 2017-10-24 DIAGNOSIS — D539 Nutritional anemia, unspecified: Secondary | ICD-10-CM | POA: Diagnosis not present

## 2017-10-24 DIAGNOSIS — E785 Hyperlipidemia, unspecified: Secondary | ICD-10-CM | POA: Diagnosis not present

## 2017-10-24 DIAGNOSIS — R590 Localized enlarged lymph nodes: Secondary | ICD-10-CM | POA: Diagnosis not present

## 2017-10-24 DIAGNOSIS — N401 Enlarged prostate with lower urinary tract symptoms: Secondary | ICD-10-CM | POA: Diagnosis not present

## 2017-10-24 DIAGNOSIS — M109 Gout, unspecified: Secondary | ICD-10-CM | POA: Diagnosis not present

## 2017-10-24 DIAGNOSIS — I252 Old myocardial infarction: Secondary | ICD-10-CM | POA: Diagnosis not present

## 2017-10-24 DIAGNOSIS — C7951 Secondary malignant neoplasm of bone: Secondary | ICD-10-CM | POA: Diagnosis not present

## 2017-10-24 DIAGNOSIS — R350 Frequency of micturition: Secondary | ICD-10-CM | POA: Diagnosis not present

## 2017-10-24 DIAGNOSIS — Z7902 Long term (current) use of antithrombotics/antiplatelets: Secondary | ICD-10-CM | POA: Diagnosis not present

## 2017-10-24 DIAGNOSIS — I700 Atherosclerosis of aorta: Secondary | ICD-10-CM | POA: Diagnosis not present

## 2017-10-29 DIAGNOSIS — N401 Enlarged prostate with lower urinary tract symptoms: Secondary | ICD-10-CM | POA: Diagnosis not present

## 2017-10-29 DIAGNOSIS — C61 Malignant neoplasm of prostate: Secondary | ICD-10-CM | POA: Diagnosis not present

## 2017-10-29 DIAGNOSIS — D509 Iron deficiency anemia, unspecified: Secondary | ICD-10-CM | POA: Diagnosis not present

## 2017-10-29 DIAGNOSIS — Z79891 Long term (current) use of opiate analgesic: Secondary | ICD-10-CM | POA: Diagnosis not present

## 2017-10-29 DIAGNOSIS — I129 Hypertensive chronic kidney disease with stage 1 through stage 4 chronic kidney disease, or unspecified chronic kidney disease: Secondary | ICD-10-CM | POA: Diagnosis not present

## 2017-10-29 DIAGNOSIS — Z955 Presence of coronary angioplasty implant and graft: Secondary | ICD-10-CM | POA: Diagnosis not present

## 2017-10-29 DIAGNOSIS — M109 Gout, unspecified: Secondary | ICD-10-CM | POA: Diagnosis not present

## 2017-10-29 DIAGNOSIS — I252 Old myocardial infarction: Secondary | ICD-10-CM | POA: Diagnosis not present

## 2017-10-29 DIAGNOSIS — R3915 Urgency of urination: Secondary | ICD-10-CM | POA: Diagnosis not present

## 2017-10-29 DIAGNOSIS — D539 Nutritional anemia, unspecified: Secondary | ICD-10-CM | POA: Diagnosis not present

## 2017-10-29 DIAGNOSIS — R590 Localized enlarged lymph nodes: Secondary | ICD-10-CM | POA: Diagnosis not present

## 2017-10-29 DIAGNOSIS — G893 Neoplasm related pain (acute) (chronic): Secondary | ICD-10-CM | POA: Diagnosis not present

## 2017-10-29 DIAGNOSIS — N189 Chronic kidney disease, unspecified: Secondary | ICD-10-CM | POA: Diagnosis not present

## 2017-10-29 DIAGNOSIS — C7951 Secondary malignant neoplasm of bone: Secondary | ICD-10-CM | POA: Diagnosis not present

## 2017-10-29 DIAGNOSIS — R350 Frequency of micturition: Secondary | ICD-10-CM | POA: Diagnosis not present

## 2017-10-29 DIAGNOSIS — Z7902 Long term (current) use of antithrombotics/antiplatelets: Secondary | ICD-10-CM | POA: Diagnosis not present

## 2017-10-29 DIAGNOSIS — I25119 Atherosclerotic heart disease of native coronary artery with unspecified angina pectoris: Secondary | ICD-10-CM | POA: Diagnosis not present

## 2017-10-29 DIAGNOSIS — E785 Hyperlipidemia, unspecified: Secondary | ICD-10-CM | POA: Diagnosis not present

## 2017-10-29 DIAGNOSIS — Z8673 Personal history of transient ischemic attack (TIA), and cerebral infarction without residual deficits: Secondary | ICD-10-CM | POA: Diagnosis not present

## 2017-10-29 DIAGNOSIS — D6182 Myelophthisis: Secondary | ICD-10-CM | POA: Diagnosis not present

## 2017-10-29 DIAGNOSIS — D630 Anemia in neoplastic disease: Secondary | ICD-10-CM | POA: Diagnosis not present

## 2017-10-29 DIAGNOSIS — C7952 Secondary malignant neoplasm of bone marrow: Secondary | ICD-10-CM | POA: Diagnosis not present

## 2017-10-29 DIAGNOSIS — I700 Atherosclerosis of aorta: Secondary | ICD-10-CM | POA: Diagnosis not present

## 2017-10-31 ENCOUNTER — Telehealth: Payer: Self-pay | Admitting: Hematology

## 2017-10-31 ENCOUNTER — Inpatient Hospital Stay: Payer: Medicare Other | Attending: Hematology | Admitting: Hematology

## 2017-10-31 ENCOUNTER — Inpatient Hospital Stay: Payer: Medicare Other

## 2017-10-31 ENCOUNTER — Encounter: Payer: Self-pay | Admitting: Hematology

## 2017-10-31 VITALS — BP 153/74 | HR 86 | Temp 98.4°F | Resp 18 | Ht 67.0 in | Wt 186.4 lb

## 2017-10-31 DIAGNOSIS — I129 Hypertensive chronic kidney disease with stage 1 through stage 4 chronic kidney disease, or unspecified chronic kidney disease: Secondary | ICD-10-CM | POA: Insufficient documentation

## 2017-10-31 DIAGNOSIS — Z79899 Other long term (current) drug therapy: Secondary | ICD-10-CM | POA: Diagnosis not present

## 2017-10-31 DIAGNOSIS — N189 Chronic kidney disease, unspecified: Secondary | ICD-10-CM | POA: Insufficient documentation

## 2017-10-31 DIAGNOSIS — D649 Anemia, unspecified: Secondary | ICD-10-CM | POA: Diagnosis not present

## 2017-10-31 DIAGNOSIS — C61 Malignant neoplasm of prostate: Secondary | ICD-10-CM

## 2017-10-31 DIAGNOSIS — C7951 Secondary malignant neoplasm of bone: Secondary | ICD-10-CM | POA: Diagnosis not present

## 2017-10-31 DIAGNOSIS — R590 Localized enlarged lymph nodes: Secondary | ICD-10-CM | POA: Insufficient documentation

## 2017-10-31 DIAGNOSIS — D6182 Myelophthisis: Secondary | ICD-10-CM

## 2017-10-31 DIAGNOSIS — G893 Neoplasm related pain (acute) (chronic): Secondary | ICD-10-CM

## 2017-10-31 LAB — CMP (CANCER CENTER ONLY)
ALBUMIN: 4 g/dL (ref 3.5–5.0)
ALK PHOS: 471 U/L — AB (ref 38–126)
ALT: 10 U/L (ref 0–44)
ANION GAP: 6 (ref 5–15)
AST: 18 U/L (ref 15–41)
BILIRUBIN TOTAL: 0.7 mg/dL (ref 0.3–1.2)
BUN: 21 mg/dL (ref 8–23)
CALCIUM: 8.7 mg/dL — AB (ref 8.9–10.3)
CO2: 26 mmol/L (ref 22–32)
Chloride: 104 mmol/L (ref 98–111)
Creatinine: 2.02 mg/dL — ABNORMAL HIGH (ref 0.61–1.24)
GFR, EST NON AFRICAN AMERICAN: 28 mL/min — AB (ref >60)
GFR, Est AFR Am: 33 mL/min — ABNORMAL LOW (ref >60)
GLUCOSE: 114 mg/dL — AB (ref 70–99)
Potassium: 4.9 mmol/L (ref 3.5–5.1)
Sodium: 136 mmol/L (ref 135–145)
TOTAL PROTEIN: 7 g/dL (ref 6.5–8.1)

## 2017-10-31 LAB — CBC WITH DIFFERENTIAL/PLATELET
Basophils Absolute: 0 10*3/uL (ref 0.0–0.1)
Basophils Relative: 0 %
Eosinophils Absolute: 0.1 10*3/uL (ref 0.0–0.5)
Eosinophils Relative: 1 %
HCT: 29.6 % — ABNORMAL LOW (ref 38.4–49.9)
HEMOGLOBIN: 9.2 g/dL — AB (ref 13.0–17.1)
LYMPHS ABS: 2.5 10*3/uL (ref 0.9–3.3)
LYMPHS PCT: 36 %
MCH: 31.8 pg (ref 27.2–33.4)
MCHC: 31.1 g/dL — AB (ref 32.0–36.0)
MCV: 102.4 fL — AB (ref 79.3–98.0)
MONO ABS: 0.9 10*3/uL (ref 0.1–0.9)
MONOS PCT: 13 %
NEUTROS PCT: 50 %
Neutro Abs: 3.4 10*3/uL (ref 1.5–6.5)
Platelets: 155 10*3/uL (ref 140–400)
RBC: 2.89 MIL/uL — ABNORMAL LOW (ref 4.20–5.82)
RDW: 20.1 % — AB (ref 11.0–14.6)
WBC: 6.9 10*3/uL (ref 4.0–10.3)

## 2017-10-31 LAB — MAGNESIUM: MAGNESIUM: 2.8 mg/dL — AB (ref 1.7–2.4)

## 2017-10-31 MED ORDER — DENOSUMAB 120 MG/1.7ML ~~LOC~~ SOLN
120.0000 mg | Freq: Once | SUBCUTANEOUS | Status: AC
  Administered 2017-10-31: 120 mg via SUBCUTANEOUS

## 2017-10-31 NOTE — Patient Instructions (Signed)
Take 2 Tums daily for one week.  Begin today.      Denosumab injection What is this medicine? DENOSUMAB (den oh sue mab) slows bone breakdown. Prolia is used to treat osteoporosis in women after menopause and in men. Xgeva is used to treat a high calcium level due to cancer and to prevent bone fractures and other bone problems caused by multiple myeloma or cancer bone metastases. Xgeva is also used to treat giant cell tumor of the bone. This medicine may be used for other purposes; ask your health care provider or pharmacist if you have questions. COMMON BRAND NAME(S): Prolia, XGEVA What should I tell my health care provider before I take this medicine? They need to know if you have any of these conditions: -dental disease -having surgery or tooth extraction -infection -kidney disease -low levels of calcium or Vitamin D in the blood -malnutrition -on hemodialysis -skin conditions or sensitivity -thyroid or parathyroid disease -an unusual reaction to denosumab, other medicines, foods, dyes, or preservatives -pregnant or trying to get pregnant -breast-feeding How should I use this medicine? This medicine is for injection under the skin. It is given by a health care professional in a hospital or clinic setting. If you are getting Prolia, a special MedGuide will be given to you by the pharmacist with each prescription and refill. Be sure to read this information carefully each time. For Prolia, talk to your pediatrician regarding the use of this medicine in children. Special care may be needed. For Xgeva, talk to your pediatrician regarding the use of this medicine in children. While this drug may be prescribed for children as young as 13 years for selected conditions, precautions do apply. Overdosage: If you think you have taken too much of this medicine contact a poison control center or emergency room at once. NOTE: This medicine is only for you. Do not share this medicine with  others. What if I miss a dose? It is important not to miss your dose. Call your doctor or health care professional if you are unable to keep an appointment. What may interact with this medicine? Do not take this medicine with any of the following medications: -other medicines containing denosumab This medicine may also interact with the following medications: -medicines that lower your chance of fighting infection -steroid medicines like prednisone or cortisone This list may not describe all possible interactions. Give your health care provider a list of all the medicines, herbs, non-prescription drugs, or dietary supplements you use. Also tell them if you smoke, drink alcohol, or use illegal drugs. Some items may interact with your medicine. What should I watch for while using this medicine? Visit your doctor or health care professional for regular checks on your progress. Your doctor or health care professional may order blood tests and other tests to see how you are doing. Call your doctor or health care professional for advice if you get a fever, chills or sore throat, or other symptoms of a cold or flu. Do not treat yourself. This drug may decrease your body's ability to fight infection. Try to avoid being around people who are sick. You should make sure you get enough calcium and vitamin D while you are taking this medicine, unless your doctor tells you not to. Discuss the foods you eat and the vitamins you take with your health care professional. See your dentist regularly. Brush and floss your teeth as directed. Before you have any dental work done, tell your dentist you are receiving this   medicine. Do not become pregnant while taking this medicine or for 5 months after stopping it. Talk with your doctor or health care professional about your birth control options while taking this medicine. Women should inform their doctor if they wish to become pregnant or think they might be pregnant. There  is a potential for serious side effects to an unborn child. Talk to your health care professional or pharmacist for more information. What side effects may I notice from receiving this medicine? Side effects that you should report to your doctor or health care professional as soon as possible: -allergic reactions like skin rash, itching or hives, swelling of the face, lips, or tongue -bone pain -breathing problems -dizziness -jaw pain, especially after dental work -redness, blistering, peeling of the skin -signs and symptoms of infection like fever or chills; cough; sore throat; pain or trouble passing urine -signs of low calcium like fast heartbeat, muscle cramps or muscle pain; pain, tingling, numbness in the hands or feet; seizures -unusual bleeding or bruising -unusually weak or tired Side effects that usually do not require medical attention (report to your doctor or health care professional if they continue or are bothersome): -constipation -diarrhea -headache -joint pain -loss of appetite -muscle pain -runny nose -tiredness -upset stomach This list may not describe all possible side effects. Call your doctor for medical advice about side effects. You may report side effects to FDA at 1-800-FDA-1088. Where should I keep my medicine? This medicine is only given in a clinic, doctor's office, or other health care setting and will not be stored at home. NOTE: This sheet is a summary. It may not cover all possible information. If you have questions about this medicine, talk to your doctor, pharmacist, or health care provider.  2018 [employer] Standard (2016-05-09 19:17:21)  

## 2017-10-31 NOTE — Telephone Encounter (Signed)
Scheduled appt per 7/3 los- gave patient AVS and calender per los.  

## 2017-10-31 NOTE — Progress Notes (Signed)
Verbal consent to give Delton See today with calcium noted at 8.7 today. May take extra calcium also.

## 2017-11-01 LAB — PROSTATE-SPECIFIC AG, SERUM (LABCORP): PROSTATE SPECIFIC AG, SERUM: 55.8 ng/mL — AB (ref 0.0–4.0)

## 2017-11-02 DIAGNOSIS — I700 Atherosclerosis of aorta: Secondary | ICD-10-CM | POA: Diagnosis not present

## 2017-11-02 DIAGNOSIS — E785 Hyperlipidemia, unspecified: Secondary | ICD-10-CM | POA: Diagnosis not present

## 2017-11-02 DIAGNOSIS — Z7902 Long term (current) use of antithrombotics/antiplatelets: Secondary | ICD-10-CM | POA: Diagnosis not present

## 2017-11-02 DIAGNOSIS — I129 Hypertensive chronic kidney disease with stage 1 through stage 4 chronic kidney disease, or unspecified chronic kidney disease: Secondary | ICD-10-CM | POA: Diagnosis not present

## 2017-11-02 DIAGNOSIS — I252 Old myocardial infarction: Secondary | ICD-10-CM | POA: Diagnosis not present

## 2017-11-02 DIAGNOSIS — G893 Neoplasm related pain (acute) (chronic): Secondary | ICD-10-CM | POA: Diagnosis not present

## 2017-11-02 DIAGNOSIS — C7952 Secondary malignant neoplasm of bone marrow: Secondary | ICD-10-CM | POA: Diagnosis not present

## 2017-11-02 DIAGNOSIS — D509 Iron deficiency anemia, unspecified: Secondary | ICD-10-CM | POA: Diagnosis not present

## 2017-11-02 DIAGNOSIS — M109 Gout, unspecified: Secondary | ICD-10-CM | POA: Diagnosis not present

## 2017-11-02 DIAGNOSIS — N401 Enlarged prostate with lower urinary tract symptoms: Secondary | ICD-10-CM | POA: Diagnosis not present

## 2017-11-02 DIAGNOSIS — R350 Frequency of micturition: Secondary | ICD-10-CM | POA: Diagnosis not present

## 2017-11-02 DIAGNOSIS — N189 Chronic kidney disease, unspecified: Secondary | ICD-10-CM | POA: Diagnosis not present

## 2017-11-02 DIAGNOSIS — R3915 Urgency of urination: Secondary | ICD-10-CM | POA: Diagnosis not present

## 2017-11-02 DIAGNOSIS — D630 Anemia in neoplastic disease: Secondary | ICD-10-CM | POA: Diagnosis not present

## 2017-11-02 DIAGNOSIS — D6182 Myelophthisis: Secondary | ICD-10-CM | POA: Diagnosis not present

## 2017-11-02 DIAGNOSIS — I25119 Atherosclerotic heart disease of native coronary artery with unspecified angina pectoris: Secondary | ICD-10-CM | POA: Diagnosis not present

## 2017-11-02 DIAGNOSIS — C61 Malignant neoplasm of prostate: Secondary | ICD-10-CM | POA: Diagnosis not present

## 2017-11-02 DIAGNOSIS — Z79891 Long term (current) use of opiate analgesic: Secondary | ICD-10-CM | POA: Diagnosis not present

## 2017-11-02 DIAGNOSIS — R590 Localized enlarged lymph nodes: Secondary | ICD-10-CM | POA: Diagnosis not present

## 2017-11-02 DIAGNOSIS — Z8673 Personal history of transient ischemic attack (TIA), and cerebral infarction without residual deficits: Secondary | ICD-10-CM | POA: Diagnosis not present

## 2017-11-02 DIAGNOSIS — D539 Nutritional anemia, unspecified: Secondary | ICD-10-CM | POA: Diagnosis not present

## 2017-11-02 DIAGNOSIS — C7951 Secondary malignant neoplasm of bone: Secondary | ICD-10-CM | POA: Diagnosis not present

## 2017-11-02 DIAGNOSIS — Z955 Presence of coronary angioplasty implant and graft: Secondary | ICD-10-CM | POA: Diagnosis not present

## 2017-11-05 ENCOUNTER — Telehealth: Payer: Self-pay | Admitting: Family Medicine

## 2017-11-05 NOTE — Progress Notes (Signed)
HEMATOLOGY/ONCOLOGY CLINIC NOTE  Date of Service: 10/31/17  Patient Care Team: [name], MD as PCP - General (Family Medicine)  CHIEF COMPLAINTS/PURPOSE OF CONSULTATION:  -recently diagnosed metastatic prostate cancer -Myelopthisic anemia  HISTORY OF PRESENTING ILLNESS:   Tony [name] is a wonderful 84 y.o. male who has been referred to [place] by Dr Dimas Chyle for evaluation and management of anemia. He is accompanied today by his daughter. The pt reports that he is doing well overall.   The pt reports a new onset of fatigue that began in January 2019. His daughter notes being able to tell a difference in his energy levels as early as November 2018. He notes that some cramping pain in his thighs. He notes that his recent 07/23/17 blood transfusion has led to a "terrific, immediate change" in his thigh pain. However, his thigh pain has returned in the last couple days.  He notes that he takes 1038mg Vitamin B12 daily.   He notes that for 6-7 years he took iron pills after his 2012 heart attack and stroke. He notes that he took a statin for 6-7 months and was taken off of it recently. He notes no unresolved symptoms from his stroke.   The pt notes that over the last 4-5 months his appetite has diminished and he has subsequently lost about 20 lbs in that time.  He notes that he has historically not preferred to drink water as such, but hydrates with juice, coffee, and other drinks.  He notes that he believes that he is able to take care of himself adequately at home. He also notes that his cane is sufficient for helping him get around.   Most recent lab results (07/22/17) of CBC  is as follows: all values are WNL except for RBC at 2.94, Hgb at 8.7, HCT at 27.2, RDW at 26.5, Platelets at 134k. CBC from 10/30/16 revealed all values WNL.  CMP 07/21/17 revealed all values WNL except for Sodium at 134, Glucose at 101, Creatinine at 1.85, Calcium at 7.9, Total Protein at 5.7, Albumin at  2.7, Alk Phos at 433.  LDH 07/20/17 elevated at 256. Haptoglobin 07/20/17 elevated at 357.  Vitamin B12 07/06/17 is WNL at 229.   On review of systems, pt reports decreased appetite, losing 20 lbs over 4-5 months, bilateral thigh pain, fatigue, mild leg swelling, and denies fevers, chills, night sweats, bone pains, nausea, abdominal pains, bleeding, blood in the urine, blood in the stools, black stools, changes in bowel habits, light headednss, dizziness, abdominal pains, noticing any new lumps or bumps, testicular pain or swelling, and any other symptoms.   On PMHx the pt reports heart attack and stroke in 2012. On Social Hx the pt denies much ETOH consumption.   Interval History:  Tony [name] today regarding his metastatic prostate cancer and myelopthisic anemia. The patient's last visit with [place] on 10/04/17. He is accompanied today by his daughter. The pt reports that he is doing well overall.   The pt reports that his thigh pain has improved some and his foot pain is still continuing. He has been using his fentanyl patch and 520mOxycodone 2-3 times each day. He describes that the pain is a 7 before he takes his break through medications. He denies a specific location of pain in his feet and thighs.   He notes that he his moving his bowels well with Senna S. He notes that his appetite has continued to be weak  and hasn't been able to eat very much.   He adds that he has recently developed some hot flashes as well.   He has continued to work with PT and notes that he is using a walker to get around his house. He is able to get out of bed and use the restroom unaided, but needs assistance to shower and bathe.   Lab results today (10/31/17) of CBC w/diff is as follows: all values are WNL except for RBC at 2.89, HGB at 9.2, HCT at 29.6, MCV at 102.4, MCHC at 31.1, RDW at 20.1. CMP 10/31/17 reviewed PSA 10/04/17 at 67.4  On review of systems, pt reports hot flashes, weak appetite, some leg  swelling, and denies constipation, staying hydrated, headaches, abdominal pains, back pain, and any other symptoms.    MEDICAL HISTORY:  Past Medical History:  Diagnosis Date  . BPH associated with nocturia    flomax 0.4--> 0.8 mg trial. nocturia if has coffee. some incontinence  . CAD (coronary artery disease)    LAD Stent 2012. 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   . Gout    no rx. apparently 1x in past  . History of stroke    no aspirin before stroke. no deficits. slurred words at time of stroke  . Hyperlipidemia   . Hypertension     SURGICAL HISTORY: Past Surgical History:  Procedure Laterality Date  . CARDIAC SURGERY     Stint  . CORONARY STENT PLACEMENT    . left arm fracture s/p surgery    . right leg fracture s.p surgery- screws like arm      SOCIAL HISTORY: Social History   Socioeconomic History  . Marital status: Married    Spouse name: Not on file  . Number of children: Not on file  . Years of education: Not on file  . Highest education level: Not on file  Occupational History  . Not on file  Social Needs  . Financial resource strain: Not on file  . Food insecurity:    Worry: Not on file    Inability: Not on file  . Transportation needs:    Medical: Not on file    Non-medical: Not on file  Tobacco Use  . Smoking status: Former Smoker    Packs/day: 0.50    Years: 1.00    Pack years: 0.50    Types: Cigarettes    Last attempt to quit: 05/01/1952    Years since quitting: 65.5  . Smokeless tobacco: Never Used  Substance and Sexual Activity  . Alcohol use: No  . Drug use: No  . Sexual activity: Not on file  Lifestyle  . Physical activity:    Days per week: Not on file    Minutes per session: Not on file  . Stress: Not on file  Relationships  . Social connections:    Talks on phone: Not on file    Gets together: Not on file    Attends religious service: Not on file    Active member of club or organization: Not on file     Attends meetings of clubs or organizations: Not on file    Relationship status: Not on file  . Intimate partner violence:    Fear of current or ex partner: Not on file    Emotionally abused: Not on file    Physically abused: Not on file    Forced sexual activity: Not on file  Other Topics Concern  .  Not on file  Social History Narrative   Married- lives separate from wife. 2 children. Daughter passed from heart attack.       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 Breast cancer Mother   . Heart disease Father   . Heart disease Brother        x2  . Prostate cancer Brother     ALLERGIES:  has No Known Allergies.  MEDICATIONS:  Current Outpatient Medications  Medication Sig Dispense Refill  . aspirin EC 81 MG tablet Take 1 tablet (81 mg total) by mouth daily. 90 tablet 3  . calcium-vitamin D (OSCAL 500/200 D-3) 500-200 MG-UNIT tablet Take 1 tablet by mouth daily with breakfast. 30 tablet 2  . ergocalciferol (VITAMIN D2) 50000 units capsule Take 1 capsule (50,000 Units total) by mouth 2 (two) times a week. 24 capsule 1  . fentaNYL (DURAGESIC - DOSED MCG/HR) 12 MCG/HR Place 1 patch (12.5 mcg total) onto the skin every 3 (three) days. 10 patch 0  . isosorbide-hydrALAZINE (BIDIL) 20-37.5 MG tablet Take 1 tablet by mouth 2 (two) times daily.    . metoprolol succinate (TOPROL-XL) 25 MG 24 hr tablet Take 1 tablet (25 mg total) by mouth daily. 90 tablet 3  . Multiple Vitamins-Minerals (MULTI COMPLETE PO) Take by mouth.    . nitroGLYCERIN (NITROSTAT) 0.4 MG SL tablet Place 1 tablet (0.4 mg total) under the tongue every 5 (five) minutes as needed for chest pain (3 maximum before seeking care). 30 tablet 0  . Omega-3 1000 MG CAPS Take 1 capsule by mouth daily.     Marland Kitchen oxyCODONE (OXY IR/ROXICODONE) 5 MG immediate release tablet Take 1 tablet (5 mg total) by mouth every 4 (four) hours as  needed for severe pain. 90 tablet 0  . polyethylene glycol (MIRALAX) packet Take 17 g by mouth daily. 30 each 1  . senna-docusate (SENNA S) 8.6-50 MG tablet Take 2 tablets by mouth 2 (two) times daily. 120 tablet 2  . tamsulosin (FLOMAX) 0.4 MG CAPS capsule Take 0.4 mg by mouth daily.    . vitamin B-12 (CYANOCOBALAMIN) 1000 MCG tablet Take 1 tablet (1,000 mcg total) by mouth daily. 30 tablet 0   No current facility-administered medications for this visit.     REVIEW OF SYSTEMS:   A 10+ POINT REVIEW OF SYSTEMS WAS OBTAINED including neurology, dermatology, psychiatry, cardiac, respiratory, lymph, extremities, GI, GU, Musculoskeletal, constitutional, breasts, reproductive, HEENT.  All pertinent positives are noted in the HPI.  All others are negative.   PHYSICAL EXAMINATION:  . Vitals:   10/31/17 1416  BP: (!) 153/74  Pulse: 86  Resp: 18  Temp: 98.4 F (36.9 C)  SpO2: 98%   Filed Weights   10/31/17 1416  Weight: 186 lb 6.4 oz (84.6 kg)   .Body mass index is 29.19 kg/m.  GENERAL:alert, in no acute distress and comfortable SKIN: no acute rashes, no significant lesions EYES: conjunctival pallor noted, sclera anicteric OROPHARYNX: MMM, no exudates, no oropharyngeal erythema or ulceration NECK: supple, no JVD LYMPH:  no palpable lymphadenopathy in the cervical, axillary or inguinal regions LUNGS: clear to auscultation b/l with normal respiratory effort HEART: regular rate & rhythm ABDOMEN:  normoactive bowel sounds , non tender, not distended. No palpable hepatosplenomegaly.  Extremity: 1+ pedal edema PSYCH: alert & oriented x 3 with fluent speech NEURO: no focal motor/sensory deficits  LABORATORY DATA:  I have reviewed the data as listed  . CBC Latest Ref Rng & Units 10/31/2017 10/04/2017 09/06/2017  WBC 4.0 - 10.3 K/uL 6.9 5.6 5.3  Hemoglobin 13.0 - 17.1 g/dL 9.2(L) 9.1(L) 10.3(L)  Hematocrit 38.4 - 49.9 % 29.6(L) 29.1(L) 32.5(L)  Platelets 140 - 400 K/uL 155 135(L) 129(L)     . CBC    Component Value Date/Time   WBC 6.9 10/31/2017 1334   RBC 2.89 (L) 10/31/2017 1334   HGB 9.2 (L) 10/31/2017 1334   HGB 10.3 (L) 09/06/2017 1131   HCT 29.6 (L) 10/31/2017 1334   HCT 21.9 (L) 07/20/2017 2103   PLT 155 10/31/2017 1334   PLT 129 (L) 09/06/2017 1131   MCV 102.4 (H) 10/31/2017 1334   MCH 31.8 10/31/2017 1334   MCHC 31.1 (L) 10/31/2017 1334   RDW 20.1 (H) 10/31/2017 1334   LYMPHSABS 2.5 10/31/2017 1334   MONOABS 0.9 10/31/2017 1334   EOSABS 0.1 10/31/2017 1334   BASOSABS 0.0 10/31/2017 1334     . CMP Latest Ref Rng & Units 10/31/2017 10/04/2017 09/06/2017  Glucose 70 - 99 mg/dL 114(H) 112 104  BUN 8 - 23 mg/dL 21 20 29(H)  Creatinine 0.61 - 1.24 mg/dL 2.02(H) 1.70(H) 2.00(H)  Sodium 135 - 145 mmol/L 136 135(L) 138  Potassium 3.5 - 5.1 mmol/L 4.9 5.1 4.9  Chloride 98 - 111 mmol/L 104 108 111(H)  CO2 22 - 32 mmol/L 26 21(L) 22  Calcium 8.9 - 10.3 mg/dL 8.7(L) 7.7(L) 6.8(L)  Total Protein 6.5 - 8.1 g/dL 7.0 6.6 6.9  Total Bilirubin 0.3 - 1.2 mg/dL 0.7 0.7 0.6  Alkaline Phos 38 - 126 U/L 471(H) 494(H) 480(H)  AST 15 - 41 U/L _0 ALT 0 - 44 U/L _1 Component     Latest Ref Rng & Units 07/30/2017  IgG (Immunoglobin G), Serum     700 - 1,600 mg/dL 1,060  IgA     61 - 437 mg/dL 140  IgM (Immunoglobulin M), Srm     15 - 143 mg/dL 34  Total Protein ELP     6.0 - 8.5 g/dL 6.4  Albumin SerPl Elph-Mcnc     2.9 - 4.4 g/dL 3.3  Alpha 1     0.0 - 0.4 g/dL 0.3  Alpha2 Glob SerPl Elph-Mcnc     0.4 - 1.0 g/dL 1.0  B-Globulin SerPl Elph-Mcnc     0.7 - 1.3 g/dL 0.9  Gamma Glob SerPl Elph-Mcnc     0.4 - 1.8 g/dL 0.9  M Protein SerPl Elph-Mcnc     Not Observed g/dL Not Observed  Globulin, Total     2.2 - 3.9 g/dL 3.1  Albumin/Glob SerPl     0.7 - 1.7 1.1  IFE 1      Comment  Please Note (HCV):      Comment  Kappa free light chain     3.3 - 19.4 mg/L 41.6 (H)  Lamda free light chains     5.7 - 26.3 mg/L 29.6 (H)  Kappa, lamda light chain  ratio     0.26 - 1.65 1.41  Retic Ct Pct     0.8 - 1.8 % 2.1 (H)  RBC.     4.20 - 5.82 MIL/uL 3.23 (L)  Retic Count, Absolute     34.8 - 93.9 K/uL 67.8  Sed Rate     0 - 16 mm/hr 98 (H)  LDH     125 -  245 U/L 307 (H)  Prostate Specific Ag, Serum     0.0 - 4.0 ng/mL 1,186.0 (H)   07/31/17 BM Bx:   RADIOGRAPHIC STUDIES: I have personally reviewed the radiological images as listed and agreed with the findings in the report. No results found.  ASSESSMENT & PLAN:   84 y.o. male with  1. Normocytic Anemia - due to primarily metastatic prostatic cancer (ACD + BM involvement with prostate cancer causing myelopthisic picture) -Discussed patient's most recent labs, Hgb at 8.7 on 07/22/17. Now improved to 10.3. Did receive PRBC transfusion. -In review of the patient's CBC records, his anemia has developed in the last 6-7 months, unaccompanied by a drop in his EPO (07/20/17 elevated at 249.8);  LDH elevated  but haptoglobin at 357 on 07/20/17. -suggested against active hemolysis. Significantly increased sed rate. Myeloma panel - no overt evidence of plasma cell dyscrasia. He has CKD but elevated EPO suggests against this being the primary etiology of his anemia. -Bone marrow biopsy- showed prominent involvement of the BM with metastatic carcinoma consistent with prostatic primary - causing Myelopthisic picture. -treatment will need to be directed towards his metastatic prostate cancer - recently diagnosed.  2. Recently diagnosed metastatic prostate cancer with extensive bone metastases. With bone mets/BM Mets and LNadenopathy. Exam positive for left retroperitoneal, bilateral pelvis and right inguinal adenopathy. Exam positive for left retroperitoneal, bilateral pelvis and right inguinal adenopathy. Diffuse sclerotic bone metastasis.   3. Elevated alkaline phosphatase due to bone metastases from prostate cancer -CT BM bx -concerning for significant bone lesions in lower spine and pelvis PSA  levels 1186 now down to 433 and now down to 67.4 and now 55  4. Cancer related pain - primarily in b/l thighs. -Xray b/l femurs today showed no overt measure high risk for fracture lesions Plan: -Discussed pt labwork today, 10/31/17; HGB stable at 9.2,  -Discussed that 10/04/17 PSA had decreased from 1186 three months ago to 67.4  -Will add nausea medication -Will increase to 30mg/hr Fentanyl patch from 12.548m  -Continue to increase hydration, cut down caffeine -Continue seeing physical therapy and increase activity levels as much as reasonably tolerated -Pt would prefer to not begin SSRI for hot flashes at this time -Begin elevating feet for 1+ pedal edema -Continue Lupron every 3 months -Continue Xgeva every 4 weeks  -continue prn oxycodone -Suggested keeping a stable schedule to aide memory retention and orientation  -Regarding the patient's anemia, we will set up transfusions prn for hgb<8 -Vitamin D and Calcium supplements continue. Improved vit D levels with replacement (up from 14.1 to 49.8). -continue [facility] referral for PT, RN home safety evaluation, DME -[facility]. High risk for decubitus ulcers and having difficult with thigh pains that limit mobility. -hospital bed -limited ambulation -   RTC with Dr KaIrene Limbon 4 weeks with labs Continue Xgeva q4weeks Continue Lupron q12 weeks   All of the patients questions were answered with apparent satisfaction. The patient knows to call the clinic with any problems, questions or concerns.  . The total time spent in the appointment was 25 minutes and more than 50% was on counseling and direct patient cares.          GaSullivan LoneD MSComanche CreekAHIVMS SCPortsmouth Regional Ambulatory Surgery Center LLCTLakeland Community [facility], Watervlietematology/Oncology Physician CoHoly Family Hosp @ [facility](Office):       [phone] cell):  [phone]):           [phone]/06/2017 2:54 PM  I, Baldwin [place], am acting as a Education administrator for Dr [name].   .I have  reviewed the above documentation for accuracy and completeness, and I agree with the above .Brunetta Genera MD

## 2017-11-05 NOTE — Telephone Encounter (Signed)
Copied from [place] [id number]. Topic: Quick Communication - See Telephone Encounter >> Nov 05, 2017  9:32 AM Conception Chancy, NT wrote: CRM for notification. See Telephone encounter for: 11/05/17.  Ebony Hail is calling from [facility], she is a speech therapist requesting verbal orders to recertify patient for speech therapy 1x a week for 4 weeks.  Cb# [phone]

## 2017-11-06 ENCOUNTER — Other Ambulatory Visit: Payer: Self-pay | Admitting: Hematology

## 2017-11-06 DIAGNOSIS — C61 Malignant neoplasm of prostate: Secondary | ICD-10-CM | POA: Diagnosis not present

## 2017-11-06 DIAGNOSIS — Z79891 Long term (current) use of opiate analgesic: Secondary | ICD-10-CM | POA: Diagnosis not present

## 2017-11-06 DIAGNOSIS — D630 Anemia in neoplastic disease: Secondary | ICD-10-CM | POA: Diagnosis not present

## 2017-11-06 DIAGNOSIS — D6182 Myelophthisis: Secondary | ICD-10-CM | POA: Diagnosis not present

## 2017-11-06 DIAGNOSIS — I129 Hypertensive chronic kidney disease with stage 1 through stage 4 chronic kidney disease, or unspecified chronic kidney disease: Secondary | ICD-10-CM | POA: Diagnosis not present

## 2017-11-06 DIAGNOSIS — I252 Old myocardial infarction: Secondary | ICD-10-CM | POA: Diagnosis not present

## 2017-11-06 DIAGNOSIS — C7952 Secondary malignant neoplasm of bone marrow: Secondary | ICD-10-CM | POA: Diagnosis not present

## 2017-11-06 DIAGNOSIS — Z7902 Long term (current) use of antithrombotics/antiplatelets: Secondary | ICD-10-CM | POA: Diagnosis not present

## 2017-11-06 DIAGNOSIS — I25119 Atherosclerotic heart disease of native coronary artery with unspecified angina pectoris: Secondary | ICD-10-CM | POA: Diagnosis not present

## 2017-11-06 DIAGNOSIS — Z8673 Personal history of transient ischemic attack (TIA), and cerebral infarction without residual deficits: Secondary | ICD-10-CM | POA: Diagnosis not present

## 2017-11-06 DIAGNOSIS — D509 Iron deficiency anemia, unspecified: Secondary | ICD-10-CM | POA: Diagnosis not present

## 2017-11-06 DIAGNOSIS — R350 Frequency of micturition: Secondary | ICD-10-CM | POA: Diagnosis not present

## 2017-11-06 DIAGNOSIS — I700 Atherosclerosis of aorta: Secondary | ICD-10-CM | POA: Diagnosis not present

## 2017-11-06 DIAGNOSIS — M109 Gout, unspecified: Secondary | ICD-10-CM | POA: Diagnosis not present

## 2017-11-06 DIAGNOSIS — D539 Nutritional anemia, unspecified: Secondary | ICD-10-CM | POA: Diagnosis not present

## 2017-11-06 DIAGNOSIS — G893 Neoplasm related pain (acute) (chronic): Secondary | ICD-10-CM | POA: Diagnosis not present

## 2017-11-06 DIAGNOSIS — C7951 Secondary malignant neoplasm of bone: Secondary | ICD-10-CM | POA: Diagnosis not present

## 2017-11-06 DIAGNOSIS — Z955 Presence of coronary angioplasty implant and graft: Secondary | ICD-10-CM | POA: Diagnosis not present

## 2017-11-06 DIAGNOSIS — R590 Localized enlarged lymph nodes: Secondary | ICD-10-CM | POA: Diagnosis not present

## 2017-11-06 DIAGNOSIS — N401 Enlarged prostate with lower urinary tract symptoms: Secondary | ICD-10-CM | POA: Diagnosis not present

## 2017-11-06 DIAGNOSIS — N189 Chronic kidney disease, unspecified: Secondary | ICD-10-CM | POA: Diagnosis not present

## 2017-11-06 DIAGNOSIS — E785 Hyperlipidemia, unspecified: Secondary | ICD-10-CM | POA: Diagnosis not present

## 2017-11-06 DIAGNOSIS — R3915 Urgency of urination: Secondary | ICD-10-CM | POA: Diagnosis not present

## 2017-11-06 MED ORDER — FENTANYL 25 MCG/HR TD PT72: 25.0000 ug | MEDICATED_PATCH | TRANSDERMAL | 0 refills | Status: DC

## 2017-11-06 NOTE — Telephone Encounter (Signed)
Verbal orders given  

## 2017-11-08 DIAGNOSIS — I129 Hypertensive chronic kidney disease with stage 1 through stage 4 chronic kidney disease, or unspecified chronic kidney disease: Secondary | ICD-10-CM | POA: Diagnosis not present

## 2017-11-08 DIAGNOSIS — D630 Anemia in neoplastic disease: Secondary | ICD-10-CM | POA: Diagnosis not present

## 2017-11-08 DIAGNOSIS — D509 Iron deficiency anemia, unspecified: Secondary | ICD-10-CM | POA: Diagnosis not present

## 2017-11-08 DIAGNOSIS — I252 Old myocardial infarction: Secondary | ICD-10-CM | POA: Diagnosis not present

## 2017-11-08 DIAGNOSIS — C7952 Secondary malignant neoplasm of bone marrow: Secondary | ICD-10-CM | POA: Diagnosis not present

## 2017-11-08 DIAGNOSIS — M109 Gout, unspecified: Secondary | ICD-10-CM | POA: Diagnosis not present

## 2017-11-08 DIAGNOSIS — C7951 Secondary malignant neoplasm of bone: Secondary | ICD-10-CM | POA: Diagnosis not present

## 2017-11-08 DIAGNOSIS — R350 Frequency of micturition: Secondary | ICD-10-CM | POA: Diagnosis not present

## 2017-11-08 DIAGNOSIS — I700 Atherosclerosis of aorta: Secondary | ICD-10-CM | POA: Diagnosis not present

## 2017-11-08 DIAGNOSIS — G893 Neoplasm related pain (acute) (chronic): Secondary | ICD-10-CM | POA: Diagnosis not present

## 2017-11-08 DIAGNOSIS — N189 Chronic kidney disease, unspecified: Secondary | ICD-10-CM | POA: Diagnosis not present

## 2017-11-08 DIAGNOSIS — Z955 Presence of coronary angioplasty implant and graft: Secondary | ICD-10-CM | POA: Diagnosis not present

## 2017-11-08 DIAGNOSIS — N401 Enlarged prostate with lower urinary tract symptoms: Secondary | ICD-10-CM | POA: Diagnosis not present

## 2017-11-08 DIAGNOSIS — R3915 Urgency of urination: Secondary | ICD-10-CM | POA: Diagnosis not present

## 2017-11-08 DIAGNOSIS — E785 Hyperlipidemia, unspecified: Secondary | ICD-10-CM | POA: Diagnosis not present

## 2017-11-08 DIAGNOSIS — D6182 Myelophthisis: Secondary | ICD-10-CM | POA: Diagnosis not present

## 2017-11-08 DIAGNOSIS — C61 Malignant neoplasm of prostate: Secondary | ICD-10-CM | POA: Diagnosis not present

## 2017-11-08 DIAGNOSIS — D539 Nutritional anemia, unspecified: Secondary | ICD-10-CM | POA: Diagnosis not present

## 2017-11-08 DIAGNOSIS — I25119 Atherosclerotic heart disease of native coronary artery with unspecified angina pectoris: Secondary | ICD-10-CM | POA: Diagnosis not present

## 2017-11-08 DIAGNOSIS — Z8673 Personal history of transient ischemic attack (TIA), and cerebral infarction without residual deficits: Secondary | ICD-10-CM | POA: Diagnosis not present

## 2017-11-08 DIAGNOSIS — R590 Localized enlarged lymph nodes: Secondary | ICD-10-CM | POA: Diagnosis not present

## 2017-11-08 DIAGNOSIS — Z79891 Long term (current) use of opiate analgesic: Secondary | ICD-10-CM | POA: Diagnosis not present

## 2017-11-08 DIAGNOSIS — Z7902 Long term (current) use of antithrombotics/antiplatelets: Secondary | ICD-10-CM | POA: Diagnosis not present

## 2017-11-12 DIAGNOSIS — D539 Nutritional anemia, unspecified: Secondary | ICD-10-CM | POA: Diagnosis not present

## 2017-11-12 DIAGNOSIS — I700 Atherosclerosis of aorta: Secondary | ICD-10-CM | POA: Diagnosis not present

## 2017-11-12 DIAGNOSIS — C7951 Secondary malignant neoplasm of bone: Secondary | ICD-10-CM | POA: Diagnosis not present

## 2017-11-12 DIAGNOSIS — Z955 Presence of coronary angioplasty implant and graft: Secondary | ICD-10-CM | POA: Diagnosis not present

## 2017-11-12 DIAGNOSIS — R350 Frequency of micturition: Secondary | ICD-10-CM | POA: Diagnosis not present

## 2017-11-12 DIAGNOSIS — Z79891 Long term (current) use of opiate analgesic: Secondary | ICD-10-CM | POA: Diagnosis not present

## 2017-11-12 DIAGNOSIS — I25119 Atherosclerotic heart disease of native coronary artery with unspecified angina pectoris: Secondary | ICD-10-CM | POA: Diagnosis not present

## 2017-11-12 DIAGNOSIS — D6182 Myelophthisis: Secondary | ICD-10-CM | POA: Diagnosis not present

## 2017-11-12 DIAGNOSIS — M109 Gout, unspecified: Secondary | ICD-10-CM | POA: Diagnosis not present

## 2017-11-12 DIAGNOSIS — R590 Localized enlarged lymph nodes: Secondary | ICD-10-CM | POA: Diagnosis not present

## 2017-11-12 DIAGNOSIS — N189 Chronic kidney disease, unspecified: Secondary | ICD-10-CM | POA: Diagnosis not present

## 2017-11-12 DIAGNOSIS — Z8673 Personal history of transient ischemic attack (TIA), and cerebral infarction without residual deficits: Secondary | ICD-10-CM | POA: Diagnosis not present

## 2017-11-12 DIAGNOSIS — C7952 Secondary malignant neoplasm of bone marrow: Secondary | ICD-10-CM | POA: Diagnosis not present

## 2017-11-12 DIAGNOSIS — G893 Neoplasm related pain (acute) (chronic): Secondary | ICD-10-CM | POA: Diagnosis not present

## 2017-11-12 DIAGNOSIS — D630 Anemia in neoplastic disease: Secondary | ICD-10-CM | POA: Diagnosis not present

## 2017-11-12 DIAGNOSIS — R3915 Urgency of urination: Secondary | ICD-10-CM | POA: Diagnosis not present

## 2017-11-12 DIAGNOSIS — I252 Old myocardial infarction: Secondary | ICD-10-CM | POA: Diagnosis not present

## 2017-11-12 DIAGNOSIS — E785 Hyperlipidemia, unspecified: Secondary | ICD-10-CM | POA: Diagnosis not present

## 2017-11-12 DIAGNOSIS — D509 Iron deficiency anemia, unspecified: Secondary | ICD-10-CM | POA: Diagnosis not present

## 2017-11-12 DIAGNOSIS — Z7902 Long term (current) use of antithrombotics/antiplatelets: Secondary | ICD-10-CM | POA: Diagnosis not present

## 2017-11-12 DIAGNOSIS — I129 Hypertensive chronic kidney disease with stage 1 through stage 4 chronic kidney disease, or unspecified chronic kidney disease: Secondary | ICD-10-CM | POA: Diagnosis not present

## 2017-11-14 DIAGNOSIS — D509 Iron deficiency anemia, unspecified: Secondary | ICD-10-CM | POA: Diagnosis not present

## 2017-11-14 DIAGNOSIS — Z79891 Long term (current) use of opiate analgesic: Secondary | ICD-10-CM | POA: Diagnosis not present

## 2017-11-14 DIAGNOSIS — R590 Localized enlarged lymph nodes: Secondary | ICD-10-CM | POA: Diagnosis not present

## 2017-11-14 DIAGNOSIS — Z8673 Personal history of transient ischemic attack (TIA), and cerebral infarction without residual deficits: Secondary | ICD-10-CM | POA: Diagnosis not present

## 2017-11-14 DIAGNOSIS — M109 Gout, unspecified: Secondary | ICD-10-CM | POA: Diagnosis not present

## 2017-11-14 DIAGNOSIS — I252 Old myocardial infarction: Secondary | ICD-10-CM | POA: Diagnosis not present

## 2017-11-14 DIAGNOSIS — I700 Atherosclerosis of aorta: Secondary | ICD-10-CM | POA: Diagnosis not present

## 2017-11-14 DIAGNOSIS — N189 Chronic kidney disease, unspecified: Secondary | ICD-10-CM | POA: Diagnosis not present

## 2017-11-14 DIAGNOSIS — D539 Nutritional anemia, unspecified: Secondary | ICD-10-CM | POA: Diagnosis not present

## 2017-11-14 DIAGNOSIS — Z955 Presence of coronary angioplasty implant and graft: Secondary | ICD-10-CM | POA: Diagnosis not present

## 2017-11-14 DIAGNOSIS — D630 Anemia in neoplastic disease: Secondary | ICD-10-CM | POA: Diagnosis not present

## 2017-11-14 DIAGNOSIS — I129 Hypertensive chronic kidney disease with stage 1 through stage 4 chronic kidney disease, or unspecified chronic kidney disease: Secondary | ICD-10-CM | POA: Diagnosis not present

## 2017-11-14 DIAGNOSIS — C7952 Secondary malignant neoplasm of bone marrow: Secondary | ICD-10-CM | POA: Diagnosis not present

## 2017-11-14 DIAGNOSIS — E785 Hyperlipidemia, unspecified: Secondary | ICD-10-CM | POA: Diagnosis not present

## 2017-11-14 DIAGNOSIS — D6182 Myelophthisis: Secondary | ICD-10-CM | POA: Diagnosis not present

## 2017-11-14 DIAGNOSIS — Z7902 Long term (current) use of antithrombotics/antiplatelets: Secondary | ICD-10-CM | POA: Diagnosis not present

## 2017-11-14 DIAGNOSIS — R350 Frequency of micturition: Secondary | ICD-10-CM | POA: Diagnosis not present

## 2017-11-14 DIAGNOSIS — G893 Neoplasm related pain (acute) (chronic): Secondary | ICD-10-CM | POA: Diagnosis not present

## 2017-11-14 DIAGNOSIS — R3915 Urgency of urination: Secondary | ICD-10-CM | POA: Diagnosis not present

## 2017-11-14 DIAGNOSIS — C7951 Secondary malignant neoplasm of bone: Secondary | ICD-10-CM | POA: Diagnosis not present

## 2017-11-14 DIAGNOSIS — I25119 Atherosclerotic heart disease of native coronary artery with unspecified angina pectoris: Secondary | ICD-10-CM | POA: Diagnosis not present

## 2017-11-15 ENCOUNTER — Encounter: Payer: Self-pay | Admitting: Nutrition

## 2017-11-15 NOTE — Progress Notes (Signed)
Patient's daughter concerned patient is not drinking enough water. She is requesting information on how patient can drink more water and less sweet drinks like apple juice and lemonade. Also reports he liked boost but it is too expensive. Educated patient's daughter on ways to increase fluids. Mailed coupons for patient for [employer].

## 2017-11-18 DIAGNOSIS — D6182 Myelophthisis: Secondary | ICD-10-CM | POA: Diagnosis not present

## 2017-11-19 DIAGNOSIS — I700 Atherosclerosis of aorta: Secondary | ICD-10-CM | POA: Diagnosis not present

## 2017-11-19 DIAGNOSIS — I25119 Atherosclerotic heart disease of native coronary artery with unspecified angina pectoris: Secondary | ICD-10-CM | POA: Diagnosis not present

## 2017-11-19 DIAGNOSIS — D6182 Myelophthisis: Secondary | ICD-10-CM | POA: Diagnosis not present

## 2017-11-19 DIAGNOSIS — Z79891 Long term (current) use of opiate analgesic: Secondary | ICD-10-CM | POA: Diagnosis not present

## 2017-11-19 DIAGNOSIS — D630 Anemia in neoplastic disease: Secondary | ICD-10-CM | POA: Diagnosis not present

## 2017-11-19 DIAGNOSIS — N189 Chronic kidney disease, unspecified: Secondary | ICD-10-CM | POA: Diagnosis not present

## 2017-11-19 DIAGNOSIS — G893 Neoplasm related pain (acute) (chronic): Secondary | ICD-10-CM | POA: Diagnosis not present

## 2017-11-19 DIAGNOSIS — C7951 Secondary malignant neoplasm of bone: Secondary | ICD-10-CM | POA: Diagnosis not present

## 2017-11-19 DIAGNOSIS — D509 Iron deficiency anemia, unspecified: Secondary | ICD-10-CM | POA: Diagnosis not present

## 2017-11-19 DIAGNOSIS — Z8673 Personal history of transient ischemic attack (TIA), and cerebral infarction without residual deficits: Secondary | ICD-10-CM | POA: Diagnosis not present

## 2017-11-19 DIAGNOSIS — I129 Hypertensive chronic kidney disease with stage 1 through stage 4 chronic kidney disease, or unspecified chronic kidney disease: Secondary | ICD-10-CM | POA: Diagnosis not present

## 2017-11-19 DIAGNOSIS — C7952 Secondary malignant neoplasm of bone marrow: Secondary | ICD-10-CM | POA: Diagnosis not present

## 2017-11-19 DIAGNOSIS — I252 Old myocardial infarction: Secondary | ICD-10-CM | POA: Diagnosis not present

## 2017-11-19 DIAGNOSIS — Z7902 Long term (current) use of antithrombotics/antiplatelets: Secondary | ICD-10-CM | POA: Diagnosis not present

## 2017-11-19 DIAGNOSIS — Z955 Presence of coronary angioplasty implant and graft: Secondary | ICD-10-CM | POA: Diagnosis not present

## 2017-11-19 DIAGNOSIS — R590 Localized enlarged lymph nodes: Secondary | ICD-10-CM | POA: Diagnosis not present

## 2017-11-19 DIAGNOSIS — R350 Frequency of micturition: Secondary | ICD-10-CM | POA: Diagnosis not present

## 2017-11-19 DIAGNOSIS — R3915 Urgency of urination: Secondary | ICD-10-CM | POA: Diagnosis not present

## 2017-11-19 DIAGNOSIS — D539 Nutritional anemia, unspecified: Secondary | ICD-10-CM | POA: Diagnosis not present

## 2017-11-19 DIAGNOSIS — E785 Hyperlipidemia, unspecified: Secondary | ICD-10-CM | POA: Diagnosis not present

## 2017-11-19 DIAGNOSIS — M109 Gout, unspecified: Secondary | ICD-10-CM | POA: Diagnosis not present

## 2017-11-20 DIAGNOSIS — R0600 Dyspnea, unspecified: Secondary | ICD-10-CM | POA: Diagnosis not present

## 2017-11-21 DIAGNOSIS — C7952 Secondary malignant neoplasm of bone marrow: Secondary | ICD-10-CM | POA: Diagnosis not present

## 2017-11-21 DIAGNOSIS — Z7902 Long term (current) use of antithrombotics/antiplatelets: Secondary | ICD-10-CM | POA: Diagnosis not present

## 2017-11-21 DIAGNOSIS — Z8673 Personal history of transient ischemic attack (TIA), and cerebral infarction without residual deficits: Secondary | ICD-10-CM | POA: Diagnosis not present

## 2017-11-21 DIAGNOSIS — D509 Iron deficiency anemia, unspecified: Secondary | ICD-10-CM | POA: Diagnosis not present

## 2017-11-21 DIAGNOSIS — D6182 Myelophthisis: Secondary | ICD-10-CM | POA: Diagnosis not present

## 2017-11-21 DIAGNOSIS — G893 Neoplasm related pain (acute) (chronic): Secondary | ICD-10-CM | POA: Diagnosis not present

## 2017-11-21 DIAGNOSIS — I252 Old myocardial infarction: Secondary | ICD-10-CM | POA: Diagnosis not present

## 2017-11-21 DIAGNOSIS — I700 Atherosclerosis of aorta: Secondary | ICD-10-CM | POA: Diagnosis not present

## 2017-11-21 DIAGNOSIS — D539 Nutritional anemia, unspecified: Secondary | ICD-10-CM | POA: Diagnosis not present

## 2017-11-21 DIAGNOSIS — I129 Hypertensive chronic kidney disease with stage 1 through stage 4 chronic kidney disease, or unspecified chronic kidney disease: Secondary | ICD-10-CM | POA: Diagnosis not present

## 2017-11-21 DIAGNOSIS — C7951 Secondary malignant neoplasm of bone: Secondary | ICD-10-CM | POA: Diagnosis not present

## 2017-11-21 DIAGNOSIS — D630 Anemia in neoplastic disease: Secondary | ICD-10-CM | POA: Diagnosis not present

## 2017-11-21 DIAGNOSIS — E785 Hyperlipidemia, unspecified: Secondary | ICD-10-CM | POA: Diagnosis not present

## 2017-11-21 DIAGNOSIS — R3915 Urgency of urination: Secondary | ICD-10-CM | POA: Diagnosis not present

## 2017-11-21 DIAGNOSIS — M109 Gout, unspecified: Secondary | ICD-10-CM | POA: Diagnosis not present

## 2017-11-21 DIAGNOSIS — R350 Frequency of micturition: Secondary | ICD-10-CM | POA: Diagnosis not present

## 2017-11-21 DIAGNOSIS — N189 Chronic kidney disease, unspecified: Secondary | ICD-10-CM | POA: Diagnosis not present

## 2017-11-21 DIAGNOSIS — I25119 Atherosclerotic heart disease of native coronary artery with unspecified angina pectoris: Secondary | ICD-10-CM | POA: Diagnosis not present

## 2017-11-21 DIAGNOSIS — Z955 Presence of coronary angioplasty implant and graft: Secondary | ICD-10-CM | POA: Diagnosis not present

## 2017-11-21 DIAGNOSIS — R590 Localized enlarged lymph nodes: Secondary | ICD-10-CM | POA: Diagnosis not present

## 2017-11-21 DIAGNOSIS — Z79891 Long term (current) use of opiate analgesic: Secondary | ICD-10-CM | POA: Diagnosis not present

## 2017-11-28 NOTE — Progress Notes (Signed)
HEMATOLOGY/ONCOLOGY CLINIC NOTE  Date of Service: 11/29/17    Patient Care Team: [name], MD as PCP - General (Family Medicine)  CHIEF COMPLAINTS/PURPOSE OF CONSULTATION:  -recently diagnosed metastatic prostate cancer -Myelopthisic anemia  HISTORY OF PRESENTING ILLNESS:   Tony [name] is a wonderful 84 y.o. male who has been referred to [place] by Dr Dimas Chyle for evaluation and management of anemia. He is accompanied today by his daughter. The pt reports that he is doing well overall.   The pt reports a new onset of fatigue that began in January 2019. His daughter notes being able to tell a difference in his energy levels as early as November 2018. He notes that some cramping pain in his thighs. He notes that his recent 07/23/17 blood transfusion has led to a "terrific, immediate change" in his thigh pain. However, his thigh pain has returned in the last couple days.  He notes that he takes 1088mg Vitamin B12 daily.   He notes that for 6-7 years he took iron pills after his 2012 heart attack and stroke. He notes that he took a statin for 6-7 months and was taken off of it recently. He notes no unresolved symptoms from his stroke.   The pt notes that over the last 4-5 months his appetite has diminished and he has subsequently lost about 20 lbs in that time.  He notes that he has historically not preferred to drink water as such, but hydrates with juice, coffee, and other drinks.  He notes that he believes that he is able to take care of himself adequately at home. He also notes that his cane is sufficient for helping him get around.   Most recent lab results (07/22/17) of CBC  is as follows: all values are WNL except for RBC at 2.94, Hgb at 8.7, HCT at 27.2, RDW at 26.5, Platelets at 134k. CBC from 10/30/16 revealed all values WNL.  CMP 07/21/17 revealed all values WNL except for Sodium at 134, Glucose at 101, Creatinine at 1.85, Calcium at 7.9, Total Protein at 5.7, Albumin at  2.7, Alk Phos at 433.  LDH 07/20/17 elevated at 256. Haptoglobin 07/20/17 elevated at 357.  Vitamin B12 07/06/17 is WNL at 229.   On review of systems, pt reports decreased appetite, losing 20 lbs over 4-5 months, bilateral thigh pain, fatigue, mild leg swelling, and denies fevers, chills, night sweats, bone pains, nausea, abdominal pains, bleeding, blood in the urine, blood in the stools, black stools, changes in bowel habits, light headednss, dizziness, abdominal pains, noticing any new lumps or bumps, testicular pain or swelling, and any other symptoms.   On PMHx the pt reports heart attack and stroke in 2012. On Social Hx the pt denies much ETOH consumption.   Interval History:  Tony [name] today regarding his metastatic prostate cancer and myelopthisic anemia. The patient's last visit with [place] on 10/31/17. He is accompanied today by his daughter. The pt reports that he is doing well overall.   The pt reports that he has continued to use the fentanyl patch, and he hasn't used his Oxycodone for break through pain. He notes that his pain is overall improved and well controlled. He notes that his previous thigh pain is much better. He is continuing to see PT and will have a reassessment this week. He uses a walker and a cane to get around his house and is able to get out of his bed by himself.  The pt notes that he has been consuming lots of carrot and apple juice in the interim. He has a high protein shake which he is occasionally consuming. The pt notes that his appetite continues to be weak.   He notes that he has a strong desire to return to preaching and notes that he doesn't think anything is physically limiting him from returning to this.   Lab results today (11/29/17) of CBC w/diff, CMP is as follows: all values are WNL except for RBC at 2.64, HGB at 8.5, HCT at 27.5, MCV at 104.2, MCHC at 30.9, RDW at 17.7, nRBC at 1, Potassium at 5.6, Glucose at 110, BUN at 26, Creatinine at  1.73, Calcium at 8.3, Alk Phos at 202, GFR at 40. PSA 11/29/17 is pending  On review of systems, pt reports well controlled pain, weak appetite, resolving thigh pain, steady urination stream, and denies new fatigue, light headedness, dizziness, depression, abdominal pains, and any other symptoms.    MEDICAL HISTORY:  Past Medical History:  Diagnosis Date  . BPH associated with nocturia    flomax 0.4--> 0.8 mg trial. nocturia if has coffee. some incontinence  . CAD (coronary artery disease)    LAD Stent 2012. 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   . Gout    no rx. apparently 1x in past  . History of stroke    no aspirin before stroke. no deficits. slurred words at time of stroke  . Hyperlipidemia   . Hypertension     SURGICAL HISTORY: Past Surgical History:  Procedure Laterality Date  . CARDIAC SURGERY     Stint  . CORONARY STENT PLACEMENT    . left arm fracture s/p surgery    . right leg fracture s.p surgery- screws like arm      SOCIAL HISTORY: Social History   Socioeconomic History  . Marital status: Married    Spouse name: Not on file  . Number of children: Not on file  . Years of education: Not on file  . Highest education level: Not on file  Occupational History  . Not on file  Social Needs  . Financial resource strain: Not on file  . Food insecurity:    Worry: Not on file    Inability: Not on file  . Transportation needs:    Medical: Not on file    Non-medical: Not on file  Tobacco Use  . Smoking status: Former Smoker    Packs/day: 0.50    Years: 1.00    Pack years: 0.50    Types: Cigarettes    Last attempt to quit: 05/01/1952    Years since quitting: 65.6  . Smokeless tobacco: Never Used  Substance and Sexual Activity  . Alcohol use: No  . Drug use: No  . Sexual activity: Not on file  Lifestyle  . Physical activity:    Days per week: Not on file    Minutes per session: Not on file  . Stress: Not on file  Relationships  .  Social connections:    Talks on phone: Not on file    Gets together: Not on file    Attends religious service: Not on file    Active member of club or organization: Not on file    Attends meetings of clubs or organizations: Not on file    Relationship status: Not on file  . Intimate partner violence:    Fear of current or ex partner: Not on file  Emotionally abused: Not on file    Physically abused: Not on file    Forced sexual activity: Not on file  Other Topics Concern  . Not on file  Social History Narrative   Married- lives separate from wife. 2 children. Daughter passed from heart attack.       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 Breast cancer Mother   . Heart disease Father   . Heart disease Brother        x2  . Prostate cancer Brother     ALLERGIES:  has No Known Allergies.  MEDICATIONS:  Current Outpatient Medications  Medication Sig Dispense Refill  . aspirin EC 81 MG tablet Take 1 tablet (81 mg total) by mouth daily. 90 tablet 3  . calcium-vitamin D (OSCAL 500/200 D-3) 500-200 MG-UNIT tablet Take 1 tablet by mouth daily with breakfast. 30 tablet 2  . ergocalciferol (VITAMIN D2) 50000 units capsule Take 1 capsule (50,000 Units total) by mouth 2 (two) times a week. 24 capsule 1  . fentaNYL (DURAGESIC - DOSED MCG/HR) 25 MCG/HR patch Place 1 patch (25 mcg total) onto the skin every 3 (three) days. 10 patch 0  . isosorbide-hydrALAZINE (BIDIL) 20-37.5 MG tablet Take 1 tablet by mouth 2 (two) times daily.    . metoprolol succinate (TOPROL-XL) 25 MG 24 hr tablet Take 1 tablet (25 mg total) by mouth daily. 90 tablet 3  . Multiple Vitamins-Minerals (MULTI COMPLETE PO) Take by mouth.    . nitroGLYCERIN (NITROSTAT) 0.4 MG SL tablet Place 1 tablet (0.4 mg total) under the tongue every 5 (five) minutes as needed for chest pain (3 maximum before seeking care). 30 tablet 0   . Omega-3 1000 MG CAPS Take 1 capsule by mouth daily.     Marland Kitchen oxyCODONE (OXY IR/ROXICODONE) 5 MG immediate release tablet Take 1 tablet (5 mg total) by mouth every 4 (four) hours as needed for severe pain. 90 tablet 0  . polyethylene glycol (MIRALAX) packet Take 17 g by mouth daily. 30 each 1  . senna-docusate (SENNA S) 8.6-50 MG tablet Take 2 tablets by mouth 2 (two) times daily. 120 tablet 2  . tamsulosin (FLOMAX) 0.4 MG CAPS capsule Take 0.4 mg by mouth daily.    . vitamin B-12 (CYANOCOBALAMIN) 1000 MCG tablet Take 1 tablet (1,000 mcg total) by mouth daily. 30 tablet 0   No current facility-administered medications for this visit.    Facility-Administered Medications Ordered in Other Visits  Medication Dose Route Frequency Provider Last Rate Last Dose  . denosumab (XGEVA) injection 120 mg  120 mg Subcutaneous Once Brunetta Genera, MD      . leuprolide (LUPRON) injection 22.5 mg  22.5 mg Intramuscular Once Brunetta Genera, MD        REVIEW OF SYSTEMS:   A 10+ POINT REVIEW OF SYSTEMS WAS OBTAINED including neurology, dermatology, psychiatry, cardiac, respiratory, lymph, extremities, GI, GU, Musculoskeletal, constitutional, breasts, reproductive, HEENT.  All pertinent positives are noted in the HPI.  All others are negative.   PHYSICAL EXAMINATION:  Vitals:   11/29/17 1100  BP: (!) 148/63  Pulse: 76  Resp: 18  Temp: 98.6 F (37 C)  SpO2: 100%   Filed Weights   11/29/17 1100  Weight: 183 lb 14.4 oz (83.4 kg)   .Body mass index is 28.8 kg/m.  GENERAL:alert, in no acute distress and comfortable  SKIN: no acute rashes, no significant lesions EYES: conjunctival pallor, sclera anicteric OROPHARYNX: MMM, no exudates, no oropharyngeal erythema or ulceration NECK: supple, no JVD LYMPH:  no palpable lymphadenopathy in the cervical, axillary or inguinal regions LUNGS: clear to auscultation b/l with normal respiratory effort HEART: regular rate & rhythm ABDOMEN:  normoactive  bowel sounds , non tender, not distended. No palpable hepatosplenomegaly.  Extremity: 1+ pedal edema PSYCH: alert & oriented x 3 with fluent speech NEURO: no focal motor/sensory deficits   LABORATORY DATA:  I have reviewed the data as listed  . CBC Latest Ref Rng & Units 11/29/2017 10/31/2017 10/04/2017  WBC 4.0 - 10.3 K/uL 6.0 6.9 5.6  Hemoglobin 13.0 - 17.1 g/dL 8.5(L) 9.2(L) 9.1(L)  Hematocrit 38.4 - 49.9 % 27.5(L) 29.6(L) 29.1(L)  Platelets 140 - 400 K/uL 179 155 135(L)   . CBC    Component Value Date/Time   WBC 6.0 11/29/2017 1004   RBC 2.64 (L) 11/29/2017 1004   HGB 8.5 (L) 11/29/2017 1004   HGB 10.3 (L) 09/06/2017 1131   HCT 27.5 (L) 11/29/2017 1004   HCT 21.9 (L) 07/20/2017 2103   PLT 179 11/29/2017 1004   PLT 129 (L) 09/06/2017 1131   MCV 104.2 (H) 11/29/2017 1004   MCH 32.2 11/29/2017 1004   MCHC 30.9 (L) 11/29/2017 1004   RDW 17.7 (H) 11/29/2017 1004   LYMPHSABS 1.8 11/29/2017 1004   MONOABS 0.6 11/29/2017 1004   EOSABS 0.1 11/29/2017 1004   BASOSABS 0.0 11/29/2017 1004     . CMP Latest Ref Rng & Units 11/29/2017 10/31/2017 10/04/2017  Glucose 70 - 99 mg/dL 110(H) 114(H) 112  BUN 8 - 23 mg/dL 26(H) 21 20  Creatinine 0.61 - 1.24 mg/dL 1.73(H) 2.02(H) 1.70(H)  Sodium 135 - 145 mmol/L 138 136 135(L)  Potassium 3.5 - 5.1 mmol/L 5.6(H) 4.9 5.1  Chloride 98 - 111 mmol/L 107 104 108  CO2 22 - 32 mmol/L 25 26 21(L)  Calcium 8.9 - 10.3 mg/dL 8.3(L) 8.7(L) 7.7(L)  Total Protein 6.5 - 8.1 g/dL 6.5 7.0 6.6  Total Bilirubin 0.3 - 1.2 mg/dL 0.6 0.7 0.7  Alkaline Phos 38 - 126 U/L 202(H) 471(H) 494(H)  AST 15 - 41 U/L '17 18 18  '$ ALT 0 - 44 U/L '12 10 8    '$ Component     Latest Ref Rng & Units 07/30/2017  IgG (Immunoglobin G), Serum     700 - 1,600 mg/dL 1,060  IgA     61 - 437 mg/dL 140  IgM (Immunoglobulin M), Srm     15 - 143 mg/dL 34  Total Protein ELP     6.0 - 8.5 g/dL 6.4  Albumin SerPl Elph-Mcnc     2.9 - 4.4 g/dL 3.3  Alpha 1     0.0 - 0.4 g/dL 0.3  Alpha2 Glob  SerPl Elph-Mcnc     0.4 - 1.0 g/dL 1.0  B-Globulin SerPl Elph-Mcnc     0.7 - 1.3 g/dL 0.9  Gamma Glob SerPl Elph-Mcnc     0.4 - 1.8 g/dL 0.9  M Protein SerPl Elph-Mcnc     Not Observed g/dL Not Observed  Globulin, Total     2.2 - 3.9 g/dL 3.1  Albumin/Glob SerPl     0.7 - 1.7 1.1  IFE 1      Comment  Please Note (HCV):      Comment  Kappa free light chain     3.3 - 19.4 mg/L 41.6 (H)  Lamda free light  chains     5.7 - 26.3 mg/L 29.6 (H)  Kappa, lamda light chain ratio     0.26 - 1.65 1.41  Retic Ct Pct     0.8 - 1.8 % 2.1 (H)  RBC.     4.20 - 5.82 MIL/uL 3.23 (L)  Retic Count, Absolute     34.8 - 93.9 K/uL 67.8  Sed Rate     0 - 16 mm/hr 98 (H)  LDH     125 - 245 U/L 307 (H)  Prostate Specific Ag, Serum     0.0 - 4.0 ng/mL 1,186.0 (H)   Component     Latest Ref Rng & Units 07/30/2017 09/06/2017 10/04/2017 10/31/2017  Prostate Specific Ag, Serum     0.0 - 4.0 ng/mL 1,186.0 (H) 433.8 (H) 67.4 (H) 55.8 (H)   Component     Latest Ref Rng & Units 11/29/2017  Prostate Specific Ag, Serum     0.0 - 4.0 ng/mL 51.1 (H)   07/31/17 BM Bx:   RADIOGRAPHIC STUDIES: I have personally reviewed the radiological images as listed and agreed with the findings in the report. No results found.  ASSESSMENT & PLAN:   84 y.o. male with  1. Normocytic Anemia - due to primarily metastatic prostatic cancer (ACD + BM involvement with prostate cancer causing myelopthisic picture)  -Pt presented with a Hgb at 8.7 on 07/22/17, improved to 10.3 after receiving PRBC transfusion. -In review of the patient's previous CBC records, his anemia developed 6-7 months before presenting to care with me, unaccompanied by a drop in his EPO (07/20/17 elevated at 249.8);  LDH elevated  but haptoglobin at 357 on 07/20/17. -suggested against active hemolysis. Significantly increased sed rate. Myeloma panel - no overt evidence of plasma cell dyscrasia. He has CKD but elevated EPO suggests against this being the primary  etiology of his anemia. -Bone marrow biopsy- showed prominent involvement of the BM with metastatic carcinoma consistent with prostatic primary - causing Myelopthisic picture. -treatment will need to be directed towards his metastatic prostate cancer - recently diagnosed.  2. Recently diagnosed metastatic prostate cancer with extensive bone metastases. With bone mets/BM Mets and LNadenopathy. Exam positive for left retroperitoneal, bilateral pelvis and right inguinal adenopathy. Exam positive for left retroperitoneal, bilateral pelvis and right inguinal adenopathy. Diffuse sclerotic bone metastasis.   3. Elevated alkaline phosphatase due to bone metastases from prostate cancer -CT BM bx -concerning for significant bone lesions in lower spine and pelvis PSA levels have declined from1186, down to 433, down to 67.4, and now 55 to 51  4. Cancer related pain - primarily in b/l thighs. -Xray b/l femurs today showed no overt measure high risk for fracture lesions -much improved.  PLAN:  -Discussed pt labwork today, 11/29/17; HGB decreased to 8.5, nRBC decreased to 1, Potassium increased to 5.6, Creatinine improved to 1.73, Alk Phos improved to 202 -No symptomatic indication for blood transfusion, pt will let me know if he develops any fatigue, light headedness, dizziness and I will set him up for a blood transfusion -Recommended backing off on heavy carrot juice consumption with hyperkalemia.  -Continue following up with PT and staying as active as reasonably possible -Continued to emphasize optimizing hydration and nutritional status  -Continue with Xgeva every 4 weeks -Continue Lupron every 12 weeks -Will refill 73mg/hr Fentanyl patch -Continue on '81mg'$  Aspirin and Flomax  -Regarding the patient's anemia, I havel set up transfusions prn for hgb<8 -Vitamin D and Calcium supplements continue. Improved vit D levels  with replacement (up from 14.1 to 49.8). -continue [facility] referral for  PT, RN home safety evaluation, DME -Nutritional [facility] referral to [facility]  5. High risk for decubitus ulcers and having difficult with thigh pains that limit mobility. -hospital bed -limited ambulation -   -continue Lupron q65month  -continue Xgeva q4weeks -RTC with Dr KIrene Limboin 4 weeks with labs   All of the patients questions were answered with apparent satisfaction. The patient knows to call the clinic with any problems, questions or concerns.  The total time spent in the appt was 35 minutes and more than 50% was on counseling and direct patient cares.        GSullivan LoneMD MS AAHIVMS SIntracare North HospitalCNorthern Light Acadia HospitalHematology/Oncology Physician CBaylor [facility] (Office):       [phone](Work cell):  [phone](Fax):           [phone] 11/29/2017 11:38 AM  I, SBaldwin Jamaica am acting as a scribe for Dr. KIrene Limbo .I have reviewed the above documentation for accuracy and completeness, and I agree with the above. .Brunetta GeneraMD

## 2017-11-29 ENCOUNTER — Inpatient Hospital Stay: Payer: Medicare Other

## 2017-11-29 ENCOUNTER — Inpatient Hospital Stay: Payer: Medicare Other | Attending: Hematology | Admitting: Hematology

## 2017-11-29 ENCOUNTER — Encounter: Payer: Self-pay | Admitting: Hematology

## 2017-11-29 VITALS — BP 148/63 | HR 76 | Temp 98.6°F | Resp 18 | Ht 67.0 in | Wt 183.9 lb

## 2017-11-29 DIAGNOSIS — D6182 Myelophthisis: Secondary | ICD-10-CM

## 2017-11-29 DIAGNOSIS — Z8673 Personal history of transient ischemic attack (TIA), and cerebral infarction without residual deficits: Secondary | ICD-10-CM | POA: Diagnosis not present

## 2017-11-29 DIAGNOSIS — Z7982 Long term (current) use of aspirin: Secondary | ICD-10-CM | POA: Insufficient documentation

## 2017-11-29 DIAGNOSIS — R748 Abnormal levels of other serum enzymes: Secondary | ICD-10-CM | POA: Insufficient documentation

## 2017-11-29 DIAGNOSIS — N189 Chronic kidney disease, unspecified: Secondary | ICD-10-CM | POA: Diagnosis not present

## 2017-11-29 DIAGNOSIS — D649 Anemia, unspecified: Secondary | ICD-10-CM | POA: Diagnosis not present

## 2017-11-29 DIAGNOSIS — Z79899 Other long term (current) drug therapy: Secondary | ICD-10-CM | POA: Insufficient documentation

## 2017-11-29 DIAGNOSIS — C7951 Secondary malignant neoplasm of bone: Secondary | ICD-10-CM | POA: Diagnosis not present

## 2017-11-29 DIAGNOSIS — C61 Malignant neoplasm of prostate: Secondary | ICD-10-CM

## 2017-11-29 DIAGNOSIS — Z5111 Encounter for antineoplastic chemotherapy: Secondary | ICD-10-CM | POA: Diagnosis not present

## 2017-11-29 DIAGNOSIS — G893 Neoplasm related pain (acute) (chronic): Secondary | ICD-10-CM | POA: Insufficient documentation

## 2017-11-29 DIAGNOSIS — E875 Hyperkalemia: Secondary | ICD-10-CM | POA: Diagnosis not present

## 2017-11-29 DIAGNOSIS — Z87891 Personal history of nicotine dependence: Secondary | ICD-10-CM | POA: Diagnosis not present

## 2017-11-29 LAB — CBC WITH DIFFERENTIAL/PLATELET
Basophils Absolute: 0 10*3/uL (ref 0.0–0.1)
Basophils Relative: 0 %
EOS ABS: 0.1 10*3/uL (ref 0.0–0.5)
Eosinophils Relative: 1 %
HCT: 27.5 % — ABNORMAL LOW (ref 38.4–49.9)
Hemoglobin: 8.5 g/dL — ABNORMAL LOW (ref 13.0–17.1)
LYMPHS ABS: 1.8 10*3/uL (ref 0.9–3.3)
Lymphocytes Relative: 31 %
MCH: 32.2 pg (ref 27.2–33.4)
MCHC: 30.9 g/dL — AB (ref 32.0–36.0)
MCV: 104.2 fL — ABNORMAL HIGH (ref 79.3–98.0)
MONO ABS: 0.6 10*3/uL (ref 0.1–0.9)
MONOS PCT: 10 %
NEUTROS PCT: 58 %
NRBC: 1 /100{WBCs} — AB
Neutro Abs: 3.5 10*3/uL (ref 1.5–6.5)
PLATELETS: 179 10*3/uL (ref 140–400)
RBC: 2.64 MIL/uL — ABNORMAL LOW (ref 4.20–5.82)
RDW: 17.7 % — ABNORMAL HIGH (ref 11.0–14.6)
WBC: 6 10*3/uL (ref 4.0–10.3)

## 2017-11-29 LAB — CMP (CANCER CENTER ONLY)
ALBUMIN: 3.6 g/dL (ref 3.5–5.0)
ALK PHOS: 202 U/L — AB (ref 38–126)
ALT: 12 U/L (ref 0–44)
AST: 17 U/L (ref 15–41)
Anion gap: 6 (ref 5–15)
BUN: 26 mg/dL — ABNORMAL HIGH (ref 8–23)
CHLORIDE: 107 mmol/L (ref 98–111)
CO2: 25 mmol/L (ref 22–32)
CREATININE: 1.73 mg/dL — AB (ref 0.61–1.24)
Calcium: 8.3 mg/dL — ABNORMAL LOW (ref 8.9–10.3)
GFR, Est AFR Am: 40 mL/min — ABNORMAL LOW (ref >60)
GFR, Estimated: 34 mL/min — ABNORMAL LOW (ref >60)
GLUCOSE: 110 mg/dL — AB (ref 70–99)
Potassium: 5.6 mmol/L — ABNORMAL HIGH (ref 3.5–5.1)
SODIUM: 138 mmol/L (ref 135–145)
Total Bilirubin: 0.6 mg/dL (ref 0.3–1.2)
Total Protein: 6.5 g/dL (ref 6.5–8.1)

## 2017-11-29 MED ORDER — DENOSUMAB 120 MG/1.7ML ~~LOC~~ SOLN
SUBCUTANEOUS | Status: AC
  Filled 2017-11-29: qty 1.7

## 2017-11-29 MED ORDER — LEUPROLIDE ACETATE (3 MONTH) 22.5 MG IM KIT
PACK | INTRAMUSCULAR | Status: AC
  Filled 2017-11-29: qty 22.5

## 2017-11-29 MED ORDER — LEUPROLIDE ACETATE (3 MONTH) 22.5 MG IM KIT
22.5000 mg | PACK | Freq: Once | INTRAMUSCULAR | Status: AC
  Administered 2017-11-29: 22.5 mg via INTRAMUSCULAR

## 2017-11-29 MED ORDER — FENTANYL 25 MCG/HR TD PT72: 25.0000 ug | MEDICATED_PATCH | TRANSDERMAL | 0 refills | Status: DC

## 2017-11-29 MED ORDER — DENOSUMAB 120 MG/1.7ML ~~LOC~~ SOLN
120.0000 mg | Freq: Once | SUBCUTANEOUS | Status: AC
  Administered 2017-11-29: 120 mg via SUBCUTANEOUS

## 2017-11-30 DIAGNOSIS — D630 Anemia in neoplastic disease: Secondary | ICD-10-CM | POA: Diagnosis not present

## 2017-11-30 DIAGNOSIS — I129 Hypertensive chronic kidney disease with stage 1 through stage 4 chronic kidney disease, or unspecified chronic kidney disease: Secondary | ICD-10-CM | POA: Diagnosis not present

## 2017-11-30 DIAGNOSIS — E785 Hyperlipidemia, unspecified: Secondary | ICD-10-CM | POA: Diagnosis not present

## 2017-11-30 DIAGNOSIS — C7951 Secondary malignant neoplasm of bone: Secondary | ICD-10-CM | POA: Diagnosis not present

## 2017-11-30 DIAGNOSIS — C7952 Secondary malignant neoplasm of bone marrow: Secondary | ICD-10-CM | POA: Diagnosis not present

## 2017-11-30 DIAGNOSIS — R3915 Urgency of urination: Secondary | ICD-10-CM | POA: Diagnosis not present

## 2017-11-30 DIAGNOSIS — D509 Iron deficiency anemia, unspecified: Secondary | ICD-10-CM | POA: Diagnosis not present

## 2017-11-30 DIAGNOSIS — I252 Old myocardial infarction: Secondary | ICD-10-CM | POA: Diagnosis not present

## 2017-11-30 DIAGNOSIS — I700 Atherosclerosis of aorta: Secondary | ICD-10-CM | POA: Diagnosis not present

## 2017-11-30 DIAGNOSIS — D539 Nutritional anemia, unspecified: Secondary | ICD-10-CM | POA: Diagnosis not present

## 2017-11-30 DIAGNOSIS — N401 Enlarged prostate with lower urinary tract symptoms: Secondary | ICD-10-CM | POA: Diagnosis not present

## 2017-11-30 DIAGNOSIS — M109 Gout, unspecified: Secondary | ICD-10-CM | POA: Diagnosis not present

## 2017-11-30 DIAGNOSIS — R590 Localized enlarged lymph nodes: Secondary | ICD-10-CM | POA: Diagnosis not present

## 2017-11-30 DIAGNOSIS — Z79891 Long term (current) use of opiate analgesic: Secondary | ICD-10-CM | POA: Diagnosis not present

## 2017-11-30 DIAGNOSIS — R350 Frequency of micturition: Secondary | ICD-10-CM | POA: Diagnosis not present

## 2017-11-30 DIAGNOSIS — Z7902 Long term (current) use of antithrombotics/antiplatelets: Secondary | ICD-10-CM | POA: Diagnosis not present

## 2017-11-30 DIAGNOSIS — N189 Chronic kidney disease, unspecified: Secondary | ICD-10-CM | POA: Diagnosis not present

## 2017-11-30 DIAGNOSIS — C61 Malignant neoplasm of prostate: Secondary | ICD-10-CM | POA: Diagnosis not present

## 2017-11-30 DIAGNOSIS — I25119 Atherosclerotic heart disease of native coronary artery with unspecified angina pectoris: Secondary | ICD-10-CM | POA: Diagnosis not present

## 2017-11-30 DIAGNOSIS — Z955 Presence of coronary angioplasty implant and graft: Secondary | ICD-10-CM | POA: Diagnosis not present

## 2017-11-30 DIAGNOSIS — Z8673 Personal history of transient ischemic attack (TIA), and cerebral infarction without residual deficits: Secondary | ICD-10-CM | POA: Diagnosis not present

## 2017-11-30 DIAGNOSIS — G893 Neoplasm related pain (acute) (chronic): Secondary | ICD-10-CM | POA: Diagnosis not present

## 2017-11-30 DIAGNOSIS — D6182 Myelophthisis: Secondary | ICD-10-CM | POA: Diagnosis not present

## 2017-11-30 LAB — PROSTATE-SPECIFIC AG, SERUM (LABCORP): Prostate Specific Ag, Serum: 51.1 ng/mL — ABNORMAL HIGH (ref 0.0–4.0)

## 2017-12-03 ENCOUNTER — Telehealth: Payer: Self-pay | Admitting: *Deleted

## 2017-12-03 DIAGNOSIS — D630 Anemia in neoplastic disease: Secondary | ICD-10-CM | POA: Diagnosis not present

## 2017-12-03 DIAGNOSIS — M109 Gout, unspecified: Secondary | ICD-10-CM | POA: Diagnosis not present

## 2017-12-03 DIAGNOSIS — C61 Malignant neoplasm of prostate: Secondary | ICD-10-CM | POA: Diagnosis not present

## 2017-12-03 DIAGNOSIS — R350 Frequency of micturition: Secondary | ICD-10-CM | POA: Diagnosis not present

## 2017-12-03 DIAGNOSIS — Z7902 Long term (current) use of antithrombotics/antiplatelets: Secondary | ICD-10-CM | POA: Diagnosis not present

## 2017-12-03 DIAGNOSIS — D539 Nutritional anemia, unspecified: Secondary | ICD-10-CM | POA: Diagnosis not present

## 2017-12-03 DIAGNOSIS — N189 Chronic kidney disease, unspecified: Secondary | ICD-10-CM | POA: Diagnosis not present

## 2017-12-03 DIAGNOSIS — C7952 Secondary malignant neoplasm of bone marrow: Secondary | ICD-10-CM | POA: Diagnosis not present

## 2017-12-03 DIAGNOSIS — Z79891 Long term (current) use of opiate analgesic: Secondary | ICD-10-CM | POA: Diagnosis not present

## 2017-12-03 DIAGNOSIS — G893 Neoplasm related pain (acute) (chronic): Secondary | ICD-10-CM | POA: Diagnosis not present

## 2017-12-03 DIAGNOSIS — D509 Iron deficiency anemia, unspecified: Secondary | ICD-10-CM | POA: Diagnosis not present

## 2017-12-03 DIAGNOSIS — I252 Old myocardial infarction: Secondary | ICD-10-CM | POA: Diagnosis not present

## 2017-12-03 DIAGNOSIS — R590 Localized enlarged lymph nodes: Secondary | ICD-10-CM | POA: Diagnosis not present

## 2017-12-03 DIAGNOSIS — I129 Hypertensive chronic kidney disease with stage 1 through stage 4 chronic kidney disease, or unspecified chronic kidney disease: Secondary | ICD-10-CM | POA: Diagnosis not present

## 2017-12-03 DIAGNOSIS — N401 Enlarged prostate with lower urinary tract symptoms: Secondary | ICD-10-CM | POA: Diagnosis not present

## 2017-12-03 DIAGNOSIS — I700 Atherosclerosis of aorta: Secondary | ICD-10-CM | POA: Diagnosis not present

## 2017-12-03 DIAGNOSIS — I25119 Atherosclerotic heart disease of native coronary artery with unspecified angina pectoris: Secondary | ICD-10-CM | POA: Diagnosis not present

## 2017-12-03 DIAGNOSIS — E785 Hyperlipidemia, unspecified: Secondary | ICD-10-CM | POA: Diagnosis not present

## 2017-12-03 DIAGNOSIS — D6182 Myelophthisis: Secondary | ICD-10-CM | POA: Diagnosis not present

## 2017-12-03 DIAGNOSIS — C7951 Secondary malignant neoplasm of bone: Secondary | ICD-10-CM | POA: Diagnosis not present

## 2017-12-03 DIAGNOSIS — Z955 Presence of coronary angioplasty implant and graft: Secondary | ICD-10-CM | POA: Diagnosis not present

## 2017-12-03 DIAGNOSIS — Z8673 Personal history of transient ischemic attack (TIA), and cerebral infarction without residual deficits: Secondary | ICD-10-CM | POA: Diagnosis not present

## 2017-12-03 DIAGNOSIS — R3915 Urgency of urination: Secondary | ICD-10-CM | POA: Diagnosis not present

## 2017-12-03 NOTE — Telephone Encounter (Signed)
Received call from pt's daughter, [name] stating that pt saw Dr Sanda Klein & told him to let him know if he wanted blood transfusion.  Pt has decided that he does.  He is feeling very fatigued.  She states that they had a hard time in Sickle cell Dept last time with getting IV site.  She also request a refill on Vit D.  Message routed to Dr Launa Flight RN

## 2017-12-04 ENCOUNTER — Other Ambulatory Visit: Payer: Self-pay

## 2017-12-04 ENCOUNTER — Telehealth: Payer: Self-pay

## 2017-12-04 DIAGNOSIS — D649 Anemia, unspecified: Secondary | ICD-10-CM

## 2017-12-04 NOTE — Telephone Encounter (Signed)
"  [name] [phone]) calling about my father.  Over the weekend he's become weak and unstable.  Fatigued, tired, light-headed when he stands.  He no longer feels as well as he did when seen last week and instructed to call to schedule if needed.  I do not understand the process but why is this taking so long?  I'd like someone to return my call and schedule appointment for transfusion."    Reviewed patient safety with reported symptoms and transfusion lab T&C and treatment orders for scheduling.   [name] reports her father is not alone.

## 2017-12-04 NOTE — Telephone Encounter (Signed)
Received a call from patient's daughter stating that her father is feeling weaker and has decided that he wants to have a blood transfusion. Dr. [name] made aware and stated it is ok for patient to receive 1 unit PRBC's. Spoke to patient's daughter and she is aware of lab appointment on 12/05/17 at 8:30 followed by infusion appointment 12/05/17 at 9:30. Orders placed.

## 2017-12-05 ENCOUNTER — Inpatient Hospital Stay: Payer: Medicare Other

## 2017-12-05 ENCOUNTER — Other Ambulatory Visit: Payer: Self-pay | Admitting: Hematology

## 2017-12-05 ENCOUNTER — Other Ambulatory Visit: Payer: Self-pay

## 2017-12-05 VITALS — BP 134/73 | HR 65 | Temp 97.5°F | Resp 16

## 2017-12-05 DIAGNOSIS — D649 Anemia, unspecified: Secondary | ICD-10-CM | POA: Diagnosis not present

## 2017-12-05 DIAGNOSIS — E875 Hyperkalemia: Secondary | ICD-10-CM | POA: Diagnosis not present

## 2017-12-05 DIAGNOSIS — Z79899 Other long term (current) drug therapy: Secondary | ICD-10-CM | POA: Diagnosis not present

## 2017-12-05 DIAGNOSIS — C7951 Secondary malignant neoplasm of bone: Secondary | ICD-10-CM | POA: Diagnosis not present

## 2017-12-05 DIAGNOSIS — N189 Chronic kidney disease, unspecified: Secondary | ICD-10-CM | POA: Diagnosis not present

## 2017-12-05 DIAGNOSIS — G893 Neoplasm related pain (acute) (chronic): Secondary | ICD-10-CM | POA: Diagnosis not present

## 2017-12-05 DIAGNOSIS — Z87891 Personal history of nicotine dependence: Secondary | ICD-10-CM | POA: Diagnosis not present

## 2017-12-05 DIAGNOSIS — Z7982 Long term (current) use of aspirin: Secondary | ICD-10-CM | POA: Diagnosis not present

## 2017-12-05 DIAGNOSIS — R748 Abnormal levels of other serum enzymes: Secondary | ICD-10-CM | POA: Diagnosis not present

## 2017-12-05 DIAGNOSIS — Z5111 Encounter for antineoplastic chemotherapy: Secondary | ICD-10-CM | POA: Diagnosis not present

## 2017-12-05 DIAGNOSIS — Z8673 Personal history of transient ischemic attack (TIA), and cerebral infarction without residual deficits: Secondary | ICD-10-CM | POA: Diagnosis not present

## 2017-12-05 LAB — PREPARE RBC (CROSSMATCH)

## 2017-12-05 MED ORDER — DIPHENHYDRAMINE HCL 25 MG PO CAPS
25.0000 mg | ORAL_CAPSULE | Freq: Once | ORAL | Status: AC
  Administered 2017-12-05: 25 mg via ORAL

## 2017-12-05 MED ORDER — FUROSEMIDE 10 MG/ML IJ SOLN
20.0000 mg | Freq: Once | INTRAMUSCULAR | Status: AC
  Administered 2017-12-05: 20 mg via INTRAVENOUS

## 2017-12-05 MED ORDER — ACETAMINOPHEN 325 MG PO TABS
650.0000 mg | ORAL_TABLET | Freq: Once | ORAL | Status: AC
  Administered 2017-12-05: 650 mg via ORAL

## 2017-12-05 MED ORDER — SODIUM CHLORIDE 0.9% IV SOLUTION
250.0000 mL | Freq: Once | INTRAVENOUS | Status: AC
  Administered 2017-12-05: 250 mL via INTRAVENOUS
  Filled 2017-12-05: qty 250

## 2017-12-05 MED ORDER — DIPHENHYDRAMINE HCL 25 MG PO CAPS
ORAL_CAPSULE | ORAL | Status: AC
  Filled 2017-12-05: qty 1

## 2017-12-05 MED ORDER — FUROSEMIDE 10 MG/ML IJ SOLN
INTRAMUSCULAR | Status: AC
  Filled 2017-12-05: qty 2

## 2017-12-05 MED ORDER — ACETAMINOPHEN 325 MG PO TABS
ORAL_TABLET | ORAL | Status: AC
  Filled 2017-12-05: qty 2

## 2017-12-05 NOTE — Patient Instructions (Signed)

## 2017-12-06 LAB — TYPE AND SCREEN
ABO/RH(D): O POS
ANTIBODY SCREEN: NEGATIVE
Unit division: 0

## 2017-12-06 LAB — BPAM RBC
BLOOD PRODUCT EXPIRATION DATE: 201908132359
ISSUE DATE / TIME: 201908071038
UNIT TYPE AND RH: 5100

## 2017-12-07 DIAGNOSIS — I129 Hypertensive chronic kidney disease with stage 1 through stage 4 chronic kidney disease, or unspecified chronic kidney disease: Secondary | ICD-10-CM | POA: Diagnosis not present

## 2017-12-07 DIAGNOSIS — Z79891 Long term (current) use of opiate analgesic: Secondary | ICD-10-CM | POA: Diagnosis not present

## 2017-12-07 DIAGNOSIS — I700 Atherosclerosis of aorta: Secondary | ICD-10-CM | POA: Diagnosis not present

## 2017-12-07 DIAGNOSIS — R350 Frequency of micturition: Secondary | ICD-10-CM | POA: Diagnosis not present

## 2017-12-07 DIAGNOSIS — N189 Chronic kidney disease, unspecified: Secondary | ICD-10-CM | POA: Diagnosis not present

## 2017-12-07 DIAGNOSIS — G893 Neoplasm related pain (acute) (chronic): Secondary | ICD-10-CM | POA: Diagnosis not present

## 2017-12-07 DIAGNOSIS — D6182 Myelophthisis: Secondary | ICD-10-CM | POA: Diagnosis not present

## 2017-12-07 DIAGNOSIS — R3915 Urgency of urination: Secondary | ICD-10-CM | POA: Diagnosis not present

## 2017-12-07 DIAGNOSIS — I252 Old myocardial infarction: Secondary | ICD-10-CM | POA: Diagnosis not present

## 2017-12-07 DIAGNOSIS — M109 Gout, unspecified: Secondary | ICD-10-CM | POA: Diagnosis not present

## 2017-12-07 DIAGNOSIS — Z7902 Long term (current) use of antithrombotics/antiplatelets: Secondary | ICD-10-CM | POA: Diagnosis not present

## 2017-12-07 DIAGNOSIS — Z8673 Personal history of transient ischemic attack (TIA), and cerebral infarction without residual deficits: Secondary | ICD-10-CM | POA: Diagnosis not present

## 2017-12-07 DIAGNOSIS — Z955 Presence of coronary angioplasty implant and graft: Secondary | ICD-10-CM | POA: Diagnosis not present

## 2017-12-07 DIAGNOSIS — D539 Nutritional anemia, unspecified: Secondary | ICD-10-CM | POA: Diagnosis not present

## 2017-12-07 DIAGNOSIS — R590 Localized enlarged lymph nodes: Secondary | ICD-10-CM | POA: Diagnosis not present

## 2017-12-07 DIAGNOSIS — E785 Hyperlipidemia, unspecified: Secondary | ICD-10-CM | POA: Diagnosis not present

## 2017-12-07 DIAGNOSIS — C7952 Secondary malignant neoplasm of bone marrow: Secondary | ICD-10-CM | POA: Diagnosis not present

## 2017-12-07 DIAGNOSIS — D509 Iron deficiency anemia, unspecified: Secondary | ICD-10-CM | POA: Diagnosis not present

## 2017-12-07 DIAGNOSIS — I25119 Atherosclerotic heart disease of native coronary artery with unspecified angina pectoris: Secondary | ICD-10-CM | POA: Diagnosis not present

## 2017-12-07 DIAGNOSIS — C7951 Secondary malignant neoplasm of bone: Secondary | ICD-10-CM | POA: Diagnosis not present

## 2017-12-07 DIAGNOSIS — D630 Anemia in neoplastic disease: Secondary | ICD-10-CM | POA: Diagnosis not present

## 2017-12-10 DIAGNOSIS — N189 Chronic kidney disease, unspecified: Secondary | ICD-10-CM | POA: Diagnosis not present

## 2017-12-10 DIAGNOSIS — I129 Hypertensive chronic kidney disease with stage 1 through stage 4 chronic kidney disease, or unspecified chronic kidney disease: Secondary | ICD-10-CM | POA: Diagnosis not present

## 2017-12-10 DIAGNOSIS — G893 Neoplasm related pain (acute) (chronic): Secondary | ICD-10-CM | POA: Diagnosis not present

## 2017-12-10 DIAGNOSIS — R350 Frequency of micturition: Secondary | ICD-10-CM | POA: Diagnosis not present

## 2017-12-10 DIAGNOSIS — D6182 Myelophthisis: Secondary | ICD-10-CM | POA: Diagnosis not present

## 2017-12-10 DIAGNOSIS — E785 Hyperlipidemia, unspecified: Secondary | ICD-10-CM | POA: Diagnosis not present

## 2017-12-10 DIAGNOSIS — Z7902 Long term (current) use of antithrombotics/antiplatelets: Secondary | ICD-10-CM | POA: Diagnosis not present

## 2017-12-10 DIAGNOSIS — C7952 Secondary malignant neoplasm of bone marrow: Secondary | ICD-10-CM | POA: Diagnosis not present

## 2017-12-10 DIAGNOSIS — C7951 Secondary malignant neoplasm of bone: Secondary | ICD-10-CM | POA: Diagnosis not present

## 2017-12-10 DIAGNOSIS — D509 Iron deficiency anemia, unspecified: Secondary | ICD-10-CM | POA: Diagnosis not present

## 2017-12-10 DIAGNOSIS — M109 Gout, unspecified: Secondary | ICD-10-CM | POA: Diagnosis not present

## 2017-12-10 DIAGNOSIS — I700 Atherosclerosis of aorta: Secondary | ICD-10-CM | POA: Diagnosis not present

## 2017-12-10 DIAGNOSIS — D539 Nutritional anemia, unspecified: Secondary | ICD-10-CM | POA: Diagnosis not present

## 2017-12-10 DIAGNOSIS — Z8673 Personal history of transient ischemic attack (TIA), and cerebral infarction without residual deficits: Secondary | ICD-10-CM | POA: Diagnosis not present

## 2017-12-10 DIAGNOSIS — Z79891 Long term (current) use of opiate analgesic: Secondary | ICD-10-CM | POA: Diagnosis not present

## 2017-12-10 DIAGNOSIS — D630 Anemia in neoplastic disease: Secondary | ICD-10-CM | POA: Diagnosis not present

## 2017-12-10 DIAGNOSIS — I25119 Atherosclerotic heart disease of native coronary artery with unspecified angina pectoris: Secondary | ICD-10-CM | POA: Diagnosis not present

## 2017-12-10 DIAGNOSIS — Z955 Presence of coronary angioplasty implant and graft: Secondary | ICD-10-CM | POA: Diagnosis not present

## 2017-12-10 DIAGNOSIS — R590 Localized enlarged lymph nodes: Secondary | ICD-10-CM | POA: Diagnosis not present

## 2017-12-10 DIAGNOSIS — I252 Old myocardial infarction: Secondary | ICD-10-CM | POA: Diagnosis not present

## 2017-12-10 DIAGNOSIS — R3915 Urgency of urination: Secondary | ICD-10-CM | POA: Diagnosis not present

## 2017-12-13 DIAGNOSIS — Z8673 Personal history of transient ischemic attack (TIA), and cerebral infarction without residual deficits: Secondary | ICD-10-CM | POA: Diagnosis not present

## 2017-12-13 DIAGNOSIS — Z7902 Long term (current) use of antithrombotics/antiplatelets: Secondary | ICD-10-CM | POA: Diagnosis not present

## 2017-12-13 DIAGNOSIS — E785 Hyperlipidemia, unspecified: Secondary | ICD-10-CM | POA: Diagnosis not present

## 2017-12-13 DIAGNOSIS — D6182 Myelophthisis: Secondary | ICD-10-CM | POA: Diagnosis not present

## 2017-12-13 DIAGNOSIS — D539 Nutritional anemia, unspecified: Secondary | ICD-10-CM | POA: Diagnosis not present

## 2017-12-13 DIAGNOSIS — I700 Atherosclerosis of aorta: Secondary | ICD-10-CM | POA: Diagnosis not present

## 2017-12-13 DIAGNOSIS — D630 Anemia in neoplastic disease: Secondary | ICD-10-CM | POA: Diagnosis not present

## 2017-12-13 DIAGNOSIS — Z955 Presence of coronary angioplasty implant and graft: Secondary | ICD-10-CM | POA: Diagnosis not present

## 2017-12-13 DIAGNOSIS — R590 Localized enlarged lymph nodes: Secondary | ICD-10-CM | POA: Diagnosis not present

## 2017-12-13 DIAGNOSIS — M109 Gout, unspecified: Secondary | ICD-10-CM | POA: Diagnosis not present

## 2017-12-13 DIAGNOSIS — N189 Chronic kidney disease, unspecified: Secondary | ICD-10-CM | POA: Diagnosis not present

## 2017-12-13 DIAGNOSIS — I129 Hypertensive chronic kidney disease with stage 1 through stage 4 chronic kidney disease, or unspecified chronic kidney disease: Secondary | ICD-10-CM | POA: Diagnosis not present

## 2017-12-13 DIAGNOSIS — R3915 Urgency of urination: Secondary | ICD-10-CM | POA: Diagnosis not present

## 2017-12-13 DIAGNOSIS — G893 Neoplasm related pain (acute) (chronic): Secondary | ICD-10-CM | POA: Diagnosis not present

## 2017-12-13 DIAGNOSIS — I252 Old myocardial infarction: Secondary | ICD-10-CM | POA: Diagnosis not present

## 2017-12-13 DIAGNOSIS — Z79891 Long term (current) use of opiate analgesic: Secondary | ICD-10-CM | POA: Diagnosis not present

## 2017-12-13 DIAGNOSIS — D509 Iron deficiency anemia, unspecified: Secondary | ICD-10-CM | POA: Diagnosis not present

## 2017-12-13 DIAGNOSIS — C7952 Secondary malignant neoplasm of bone marrow: Secondary | ICD-10-CM | POA: Diagnosis not present

## 2017-12-13 DIAGNOSIS — C7951 Secondary malignant neoplasm of bone: Secondary | ICD-10-CM | POA: Diagnosis not present

## 2017-12-13 DIAGNOSIS — I25119 Atherosclerotic heart disease of native coronary artery with unspecified angina pectoris: Secondary | ICD-10-CM | POA: Diagnosis not present

## 2017-12-13 DIAGNOSIS — R350 Frequency of micturition: Secondary | ICD-10-CM | POA: Diagnosis not present

## 2017-12-17 DIAGNOSIS — M109 Gout, unspecified: Secondary | ICD-10-CM | POA: Diagnosis not present

## 2017-12-17 DIAGNOSIS — I129 Hypertensive chronic kidney disease with stage 1 through stage 4 chronic kidney disease, or unspecified chronic kidney disease: Secondary | ICD-10-CM | POA: Diagnosis not present

## 2017-12-17 DIAGNOSIS — C61 Malignant neoplasm of prostate: Secondary | ICD-10-CM | POA: Diagnosis not present

## 2017-12-17 DIAGNOSIS — Z79891 Long term (current) use of opiate analgesic: Secondary | ICD-10-CM | POA: Diagnosis not present

## 2017-12-17 DIAGNOSIS — I25119 Atherosclerotic heart disease of native coronary artery with unspecified angina pectoris: Secondary | ICD-10-CM | POA: Diagnosis not present

## 2017-12-17 DIAGNOSIS — G893 Neoplasm related pain (acute) (chronic): Secondary | ICD-10-CM | POA: Diagnosis not present

## 2017-12-17 DIAGNOSIS — Z7902 Long term (current) use of antithrombotics/antiplatelets: Secondary | ICD-10-CM | POA: Diagnosis not present

## 2017-12-17 DIAGNOSIS — R590 Localized enlarged lymph nodes: Secondary | ICD-10-CM | POA: Diagnosis not present

## 2017-12-17 DIAGNOSIS — I252 Old myocardial infarction: Secondary | ICD-10-CM | POA: Diagnosis not present

## 2017-12-17 DIAGNOSIS — D539 Nutritional anemia, unspecified: Secondary | ICD-10-CM | POA: Diagnosis not present

## 2017-12-17 DIAGNOSIS — R3915 Urgency of urination: Secondary | ICD-10-CM | POA: Diagnosis not present

## 2017-12-17 DIAGNOSIS — N189 Chronic kidney disease, unspecified: Secondary | ICD-10-CM | POA: Diagnosis not present

## 2017-12-17 DIAGNOSIS — Z8673 Personal history of transient ischemic attack (TIA), and cerebral infarction without residual deficits: Secondary | ICD-10-CM | POA: Diagnosis not present

## 2017-12-17 DIAGNOSIS — C7952 Secondary malignant neoplasm of bone marrow: Secondary | ICD-10-CM | POA: Diagnosis not present

## 2017-12-17 DIAGNOSIS — D6182 Myelophthisis: Secondary | ICD-10-CM | POA: Diagnosis not present

## 2017-12-17 DIAGNOSIS — D509 Iron deficiency anemia, unspecified: Secondary | ICD-10-CM | POA: Diagnosis not present

## 2017-12-17 DIAGNOSIS — C7951 Secondary malignant neoplasm of bone: Secondary | ICD-10-CM | POA: Diagnosis not present

## 2017-12-17 DIAGNOSIS — D630 Anemia in neoplastic disease: Secondary | ICD-10-CM | POA: Diagnosis not present

## 2017-12-17 DIAGNOSIS — I700 Atherosclerosis of aorta: Secondary | ICD-10-CM | POA: Diagnosis not present

## 2017-12-17 DIAGNOSIS — Z955 Presence of coronary angioplasty implant and graft: Secondary | ICD-10-CM | POA: Diagnosis not present

## 2017-12-17 DIAGNOSIS — N401 Enlarged prostate with lower urinary tract symptoms: Secondary | ICD-10-CM | POA: Diagnosis not present

## 2017-12-17 DIAGNOSIS — E785 Hyperlipidemia, unspecified: Secondary | ICD-10-CM | POA: Diagnosis not present

## 2017-12-17 DIAGNOSIS — R350 Frequency of micturition: Secondary | ICD-10-CM | POA: Diagnosis not present

## 2017-12-19 DIAGNOSIS — C61 Malignant neoplasm of prostate: Secondary | ICD-10-CM | POA: Diagnosis not present

## 2017-12-19 DIAGNOSIS — D6182 Myelophthisis: Secondary | ICD-10-CM | POA: Diagnosis not present

## 2017-12-20 ENCOUNTER — Encounter: Payer: Self-pay | Admitting: *Deleted

## 2017-12-20 DIAGNOSIS — I252 Old myocardial infarction: Secondary | ICD-10-CM | POA: Diagnosis not present

## 2017-12-20 DIAGNOSIS — Z79891 Long term (current) use of opiate analgesic: Secondary | ICD-10-CM | POA: Diagnosis not present

## 2017-12-20 DIAGNOSIS — D6182 Myelophthisis: Secondary | ICD-10-CM | POA: Diagnosis not present

## 2017-12-20 DIAGNOSIS — E785 Hyperlipidemia, unspecified: Secondary | ICD-10-CM | POA: Diagnosis not present

## 2017-12-20 DIAGNOSIS — I129 Hypertensive chronic kidney disease with stage 1 through stage 4 chronic kidney disease, or unspecified chronic kidney disease: Secondary | ICD-10-CM | POA: Diagnosis not present

## 2017-12-20 DIAGNOSIS — I25119 Atherosclerotic heart disease of native coronary artery with unspecified angina pectoris: Secondary | ICD-10-CM | POA: Diagnosis not present

## 2017-12-20 DIAGNOSIS — Z8673 Personal history of transient ischemic attack (TIA), and cerebral infarction without residual deficits: Secondary | ICD-10-CM | POA: Diagnosis not present

## 2017-12-20 DIAGNOSIS — Z7902 Long term (current) use of antithrombotics/antiplatelets: Secondary | ICD-10-CM | POA: Diagnosis not present

## 2017-12-20 DIAGNOSIS — C61 Malignant neoplasm of prostate: Secondary | ICD-10-CM | POA: Diagnosis not present

## 2017-12-20 DIAGNOSIS — G893 Neoplasm related pain (acute) (chronic): Secondary | ICD-10-CM | POA: Diagnosis not present

## 2017-12-20 DIAGNOSIS — C7952 Secondary malignant neoplasm of bone marrow: Secondary | ICD-10-CM | POA: Diagnosis not present

## 2017-12-20 DIAGNOSIS — D539 Nutritional anemia, unspecified: Secondary | ICD-10-CM | POA: Diagnosis not present

## 2017-12-20 DIAGNOSIS — Z955 Presence of coronary angioplasty implant and graft: Secondary | ICD-10-CM | POA: Diagnosis not present

## 2017-12-20 DIAGNOSIS — M109 Gout, unspecified: Secondary | ICD-10-CM | POA: Diagnosis not present

## 2017-12-20 DIAGNOSIS — D630 Anemia in neoplastic disease: Secondary | ICD-10-CM | POA: Diagnosis not present

## 2017-12-20 DIAGNOSIS — C7951 Secondary malignant neoplasm of bone: Secondary | ICD-10-CM | POA: Diagnosis not present

## 2017-12-20 DIAGNOSIS — D509 Iron deficiency anemia, unspecified: Secondary | ICD-10-CM | POA: Diagnosis not present

## 2017-12-20 DIAGNOSIS — N189 Chronic kidney disease, unspecified: Secondary | ICD-10-CM | POA: Diagnosis not present

## 2017-12-20 DIAGNOSIS — R590 Localized enlarged lymph nodes: Secondary | ICD-10-CM | POA: Diagnosis not present

## 2017-12-20 DIAGNOSIS — R3915 Urgency of urination: Secondary | ICD-10-CM | POA: Diagnosis not present

## 2017-12-20 DIAGNOSIS — N401 Enlarged prostate with lower urinary tract symptoms: Secondary | ICD-10-CM | POA: Diagnosis not present

## 2017-12-20 DIAGNOSIS — R350 Frequency of micturition: Secondary | ICD-10-CM | POA: Diagnosis not present

## 2017-12-20 DIAGNOSIS — I700 Atherosclerosis of aorta: Secondary | ICD-10-CM | POA: Diagnosis not present

## 2017-12-21 DIAGNOSIS — C61 Malignant neoplasm of prostate: Secondary | ICD-10-CM | POA: Diagnosis not present

## 2017-12-21 DIAGNOSIS — R0609 Other forms of dyspnea: Secondary | ICD-10-CM | POA: Diagnosis not present

## 2017-12-21 DIAGNOSIS — R0600 Dyspnea, unspecified: Secondary | ICD-10-CM | POA: Diagnosis not present

## 2017-12-26 NOTE — Progress Notes (Signed)
HEMATOLOGY/ONCOLOGY CLINIC NOTE  Date of Service: 12/27/17    Patient Care Team: [name], MD as PCP - General (Family Medicine)  CHIEF COMPLAINTS/PURPOSE OF CONSULTATION:  -recently diagnosed metastatic prostate cancer -Myelopthisic anemia  HISTORY OF PRESENTING ILLNESS:   Tony [name] is a wonderful 84 y.o. male who has been referred to [place] by Dr Dimas Chyle for evaluation and management of anemia. He is accompanied today by his daughter. The pt reports that he is doing well overall.   The pt reports a new onset of fatigue that began in January 2019. His daughter notes being able to tell a difference in his energy levels as early as November 2018. He notes that some cramping pain in his thighs. He notes that his recent 07/23/17 blood transfusion has led to a "terrific, immediate change" in his thigh pain. However, his thigh pain has returned in the last couple days.  He notes that he takes 1052mg Vitamin B12 daily.   He notes that for 6-7 years he took iron pills after his 2012 heart attack and stroke. He notes that he took a statin for 6-7 months and was taken off of it recently. He notes no unresolved symptoms from his stroke.   The pt notes that over the last 4-5 months his appetite has diminished and he has subsequently lost about 20 lbs in that time.  He notes that he has historically not preferred to drink water as such, but hydrates with juice, coffee, and other drinks.  He notes that he believes that he is able to take care of himself adequately at home. He also notes that his cane is sufficient for helping him get around.   Most recent lab results (07/22/17) of CBC  is as follows: all values are WNL except for RBC at 2.94, Hgb at 8.7, HCT at 27.2, RDW at 26.5, Platelets at 134k. CBC from 10/30/16 revealed all values WNL.  CMP 07/21/17 revealed all values WNL except for Sodium at 134, Glucose at 101, Creatinine at 1.85, Calcium at 7.9, Total Protein at 5.7, Albumin at  2.7, Alk Phos at 433.  LDH 07/20/17 elevated at 256. Haptoglobin 07/20/17 elevated at 357.  Vitamin B12 07/06/17 is WNL at 229.   On review of systems, pt reports decreased appetite, losing 20 lbs over 4-5 months, bilateral thigh pain, fatigue, mild leg swelling, and denies fevers, chills, night sweats, bone pains, nausea, abdominal pains, bleeding, blood in the urine, blood in the stools, black stools, changes in bowel habits, light headednss, dizziness, abdominal pains, noticing any new lumps or bumps, testicular pain or swelling, and any other symptoms.   On PMHx the pt reports heart attack and stroke in 2012. On Social Hx the pt denies much ETOH consumption.   Interval History:  Mr. FWendelin Bradtreturns today regarding his metastatic prostate cancer and myelopthisic anemia. The patient's last visit with [place] on 11[place]/17. He is accompanied today by his daughter. The pt reports that he is doing well overall.   The pt reports that he had a hot flash for a few minutes earlier today. He denies frequently recurring hot flashes. The pt notes that his thigh and shoulder pain has been well controlled overall but persists. He notes that he has not eaten as well recently and denies feelings of depression, and notes that he plans to eat better. 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 He also notes that  he has slowly increased his hydration and notes that his grandchildren help remind him to do this.  Lab results today (12/27/17) of CBC w/diff, CMP is as follows: all values are WNL except for RBC at 3.04, HGB at 9.5, HCT at 30.7, MCV at 101.0, MCHC at 30.9, RDW at 16.0, nRBC at 1, BUN at 24, Creatinine at 1.63, Calcium at 8.5, Alk Phos at 166, GFR at 43. 12/27/17 Iron and TIBC shows all values WNL except for TIBC at 156 12/27/17 Ferritin is . Lab Results  Component Value Date   IRON 86 12/27/2017   TIBC 156 (L) 12/27/2017   IRONPCTSAT 55 12/27/2017   (Iron and  TIBC)  Lab Results  Component Value Date   FERRITIN 1,813 (H) 12/27/2017   12/27/17 Vitamin D is pending 10/31/17 PSA at 55.8   On review of systems, pt reports controlled thigh and shoulder pain, few hot flashes, weaker appetite, stable energy levels, and denies abdominal pains, depression, leg swelling, and any other symptoms.   MEDICAL HISTORY:  Past Medical History:  Diagnosis Date  . BPH associated with nocturia    flomax 0.4--> 0.8 mg trial. nocturia if has coffee. some incontinence  . CAD (coronary artery disease)    LAD Stent 2012. 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   . Gout    no rx. apparently 1x in past  . History of stroke    no aspirin before stroke. no deficits. slurred words at time of stroke  . Hyperlipidemia   . Hypertension     SURGICAL HISTORY: Past Surgical History:  Procedure Laterality Date  . CARDIAC SURGERY     Stint  . CORONARY STENT PLACEMENT    . left arm fracture s/p surgery    . right leg fracture s.p surgery- screws like arm      SOCIAL HISTORY: Social History   Socioeconomic History  . Marital status: Married    Spouse name: Not on file  . Number of children: Not on file  . Years of education: Not on file  . Highest education level: Not on file  Occupational History  . Not on file  Social Needs  . Financial resource strain: Not on file  . Food insecurity:    Worry: Not on file    Inability: Not on file  . Transportation needs:    Medical: Not on file    Non-medical: Not on file  Tobacco Use  . Smoking status: Former Smoker    Packs/day: 0.50    Years: 1.00    Pack years: 0.50    Types: Cigarettes    Last attempt to quit: 05/01/1952    Years since quitting: 65.7  . Smokeless tobacco: Never Used  Substance and Sexual Activity  . Alcohol use: No  . Drug use: No  . Sexual activity: Not on file  Lifestyle  . Physical activity:    Days per week: Not on file    Minutes per session: Not on file  .  Stress: Not on file  Relationships  . Social connections:    Talks on phone: Not on file    Gets together: Not on file    Attends religious service: Not on file    Active member of club or organization: Not on file    Attends meetings of clubs or organizations: Not on file    Relationship status: Not on file  . Intimate partner violence:    Fear of current or  ex partner: Not on file    Emotionally abused: Not on file    Physically abused: Not on file    Forced sexual activity: Not on file  Other Topics Concern  . Not on file  Social History Narrative   Married- lives separate from wife. 2 children. Daughter passed from heart attack.       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 Breast cancer Mother   . Heart disease Father   . Heart disease Brother        x2  . Prostate cancer Brother     ALLERGIES:  has No Known Allergies.  MEDICATIONS:  Current Outpatient Medications  Medication Sig Dispense Refill  . aspirin EC 81 MG tablet Take 1 tablet (81 mg total) by mouth daily. 90 tablet 3  . calcium-vitamin D (OSCAL 500/200 D-3) 500-200 MG-UNIT tablet Take 1 tablet by mouth daily with breakfast. 30 tablet 2  . fentaNYL (DURAGESIC - DOSED MCG/HR) 25 MCG/HR patch Place 1 patch (25 mcg total) onto the skin every 3 (three) days. 10 patch 0  . isosorbide-hydrALAZINE (BIDIL) 20-37.5 MG tablet Take 1 tablet by mouth 2 (two) times daily.    . metoprolol succinate (TOPROL-XL) 25 MG 24 hr tablet Take 1 tablet (25 mg total) by mouth daily. 90 tablet 3  . Multiple Vitamins-Minerals (MULTI COMPLETE PO) Take by mouth.    . nitroGLYCERIN (NITROSTAT) 0.4 MG SL tablet Place 1 tablet (0.4 mg total) under the tongue every 5 (five) minutes as needed for chest pain (3 maximum before seeking care). 30 tablet 0  . Omega-3 1000 MG CAPS Take 1 capsule by mouth daily.     Marland Kitchen oxyCODONE (OXY  IR/ROXICODONE) 5 MG immediate release tablet Take 1 tablet (5 mg total) by mouth every 4 (four) hours as needed for severe pain. 90 tablet 0  . polyethylene glycol (MIRALAX) packet Take 17 g by mouth daily. 30 each 1  . senna-docusate (SENNA S) 8.6-50 MG tablet Take 2 tablets by mouth 2 (two) times daily. 120 tablet 2  . tamsulosin (FLOMAX) 0.4 MG CAPS capsule Take 0.4 mg by mouth daily.    . vitamin B-12 (CYANOCOBALAMIN) 1000 MCG tablet Take 1 tablet (1,000 mcg total) by mouth daily. 30 tablet 0  . Vitamin D, Ergocalciferol, (DRISDOL) 50000 units CAPS capsule TAKE ONE CAPSULE BY MOUTH TWICE A WEEK 24 capsule 0   No current facility-administered medications for this visit.     REVIEW OF SYSTEMS:    A 10+ POINT REVIEW OF SYSTEMS WAS OBTAINED including neurology, dermatology, psychiatry, cardiac, respiratory, lymph, extremities, GI, GU, Musculoskeletal, constitutional, breasts, reproductive, HEENT.  All pertinent positives are noted in the HPI.  All others are negative.   PHYSICAL EXAMINATION:  Vitals:   12/27/17 1204  BP: 134/60  Pulse: 75  Resp: 18  Temp: 98.3 F (36.8 C)  SpO2: 100%   Filed Weights   12/27/17 1204  Weight: 180 lb (81.6 kg)   .Body mass index is 28.19 kg/m.  GENERAL:alert, in no acute distress and comfortable SKIN: no acute rashes, no significant lesions EYES: conjunctiva are pink and non-injected, sclera anicteric OROPHARYNX: MMM, no exudates, no oropharyngeal erythema or ulceration NECK: supple, no JVD LYMPH:  no palpable lymphadenopathy in the cervical, axillary or inguinal regions LUNGS: clear to auscultation b/l with normal respiratory effort HEART: regular rate & rhythm ABDOMEN:  normoactive bowel sounds , non tender, not distended. No palpable hepatosplenomegaly.  Extremity: no pedal edema PSYCH: alert & oriented x 3 with fluent speech NEURO: no focal motor/sensory deficits   LABORATORY DATA:  I have reviewed the data as listed  . CBC Latest  Ref Rng & Units 12/27/2017 11/29/2017 10/31/2017  WBC 4.0 - 10.3 K/uL 5.7 6.0 6.9  Hemoglobin 13.0 - 17.1 g/dL 9.5(L) 8.5(L) 9.2(L)  Hematocrit 38.4 - 49.9 % 30.7(L) 27.5(L) 29.6(L)  Platelets 140 - 400 K/uL 186 179 155   . CBC    Component Value Date/Time   WBC 5.7 12/27/2017 1139   RBC 3.04 (L) 12/27/2017 1139   HGB 9.5 (L) 12/27/2017 1139   HGB 10.3 (L) 09/06/2017 1131   HCT 30.7 (L) 12/27/2017 1139   HCT 21.9 (L) 07/20/2017 2103   PLT 186 12/27/2017 1139   PLT 129 (L) 09/06/2017 1131   MCV 101.0 (H) 12/27/2017 1139   MCH 31.3 12/27/2017 1139   MCHC 30.9 (L) 12/27/2017 1139   RDW 16.0 (H) 12/27/2017 1139   LYMPHSABS 1.5 12/27/2017 1139   MONOABS 0.6 12/27/2017 1139   EOSABS 0.1 12/27/2017 1139   BASOSABS 0.0 12/27/2017 1139     . CMP Latest Ref Rng & Units 12/27/2017 11/29/2017 10/31/2017  Glucose 70 - 99 mg/dL 98 110(H) 114(H)  BUN 8 - 23 mg/dL 24(H) 26(H) 21  Creatinine 0.61 - 1.24 mg/dL 1.63(H) 1.73(H) 2.02(H)  Sodium 135 - 145 mmol/L 137 138 136  Potassium 3.5 - 5.1 mmol/L 5.0 5.6(H) 4.9  Chloride 98 - 111 mmol/L 107 107 104  CO2 22 - 32 mmol/L '24 25 26  '$ Calcium 8.9 - 10.3 mg/dL 8.5(L) 8.3(L) 8.7(L)  Total Protein 6.5 - 8.1 g/dL 6.8 6.5 7.0  Total Bilirubin 0.3 - 1.2 mg/dL 0.6 0.6 0.7  Alkaline Phos 38 - 126 U/L 166(H) 202(H) 471(H)  AST 15 - 41 U/L '18 17 18  '$ ALT 0 - 44 U/L '8 12 10    '$ Component     Latest Ref Rng & Units 07/30/2017 09/06/2017 10/04/2017 10/31/2017  Prostate Specific Ag, Serum     0.0 - 4.0 ng/mL 1,186.0 (H) 433.8 (H) 67.4 (H) 55.8 (H)   Component     Latest Ref Rng & Units 11/29/2017  Prostate Specific Ag, Serum     0.0 - 4.0 ng/mL 51.1 (H)   07/31/17 BM Bx:   RADIOGRAPHIC STUDIES: I have personally reviewed the radiological images as listed and agreed with the findings in the report. No results found.  ASSESSMENT & PLAN:   85 y.o. male with  1. Normocytic Anemia - due to primarily metastatic prostatic cancer (ACD + BM involvement with prostate  cancer causing myelopthisic picture)  -Pt presented with a Hgb at 8.7 on 07/22/17, improved to 10.3 after receiving PRBC transfusion. -In review of the patient's previous CBC records, his anemia developed 6-7 months before presenting to care with me, unaccompanied by a drop in his EPO (07/20/17 elevated at 249.8);  LDH elevated  but haptoglobin at 357 on 07/20/17. -suggested against active hemolysis.  2. Recently diagnosed metastatic prostate cancer with extensive bone metastases. With bone mets/BM Mets and LNadenopathy. Exam positive for left retroperitoneal, bilateral pelvis and right inguinal adenopathy. Exam positive for left retroperitoneal, bilateral pelvis and right inguinal adenopathy. Diffuse sclerotic bone metastasis.   3. Elevated alkaline phosphatase due to bone metastases from prostate cancer -CT BM bx -concerning for significant bone lesions in lower spine and pelvis PSA levels have declined  from1186, down to 433, down to 67.4, and now 55 to 51  4. Cancer related pain - primarily in b/l thighs. -Xray b/l femurs today showed no overt measure high risk for fracture lesions -much improved.  PLAN:  -Recommended backing off on heavy carrot juice consumption with hyperkalemia.  -Continue following up with PT and staying as active as reasonably possible -Will refill 64mg/hr Fentanyl patch -Continue on '81mg'$  Aspirin and Flomax  -Regarding the patient's anemia, I havel set up transfusions prn for hgb<8 -Vitamin D and Calcium supplements continue. Improved vit D levels with replacement (up from 14.1 to 49.8). -continue [facility] for PT, RN home safety evaluation, DME -Nutritional therapy referral to BErnestene Kiel-Discussed pt labwork today, 12/27/17; HGB increased to 9.5, blood chemistries stable. Last PSA from 10/31/17 decreased to 55.8.  -Will continue Xgeva every 4 weeks -Will continue Lupron every 12 weeks -Encouraged pt to do his best to eat well and stay well  hydrated, and stay as active as possible  -Will see the pt back in 2 months, sooner if any new concerns   5. High risk for decubitus ulcers and having difficult with thigh pains that limit mobility. -hospital bed -limited ambulation    Continue XMarchelle Folkswith labs monthly Continue Lupron q12weeks  RTC with Dr KIrene Limboin 2 months with labs    All of the patients questions were answered with apparent satisfaction. The patient knows to call the clinic with any problems, questions or concerns.  The total time spent in the appt was 30 minutes and more than 50% was on counseling and direct patient cares.      GSullivan LoneMD MS AAHIVMS SExtended Care Of Southwest LouisianaCAker Kasten Eye CenterHematology/Oncology Physician CRiverwoods Surgery Center LLC (Office):       [phone](Work cell):  [phone](Fax):           [phone] 12/27/2017 1:00 PM  I, SBaldwin Jamaica am acting as a scribe for Dr. KIrene Limbo .I have reviewed the above documentation for accuracy and completeness, and I agree with the above. .Brunetta GeneraMD

## 2017-12-27 ENCOUNTER — Encounter: Payer: Self-pay | Admitting: Hematology

## 2017-12-27 ENCOUNTER — Telehealth: Payer: Self-pay | Admitting: Hematology

## 2017-12-27 ENCOUNTER — Inpatient Hospital Stay (HOSPITAL_BASED_OUTPATIENT_CLINIC_OR_DEPARTMENT_OTHER): Payer: Medicare Other | Admitting: Hematology

## 2017-12-27 ENCOUNTER — Other Ambulatory Visit: Payer: Self-pay

## 2017-12-27 ENCOUNTER — Inpatient Hospital Stay: Payer: Medicare Other

## 2017-12-27 VITALS — BP 134/60 | HR 75 | Temp 98.3°F | Resp 18 | Ht 67.0 in | Wt 180.0 lb

## 2017-12-27 DIAGNOSIS — C61 Malignant neoplasm of prostate: Secondary | ICD-10-CM | POA: Diagnosis not present

## 2017-12-27 DIAGNOSIS — Z87891 Personal history of nicotine dependence: Secondary | ICD-10-CM | POA: Diagnosis not present

## 2017-12-27 DIAGNOSIS — E875 Hyperkalemia: Secondary | ICD-10-CM | POA: Diagnosis not present

## 2017-12-27 DIAGNOSIS — R748 Abnormal levels of other serum enzymes: Secondary | ICD-10-CM | POA: Diagnosis not present

## 2017-12-27 DIAGNOSIS — D649 Anemia, unspecified: Secondary | ICD-10-CM | POA: Diagnosis not present

## 2017-12-27 DIAGNOSIS — N189 Chronic kidney disease, unspecified: Secondary | ICD-10-CM | POA: Diagnosis not present

## 2017-12-27 DIAGNOSIS — Z8673 Personal history of transient ischemic attack (TIA), and cerebral infarction without residual deficits: Secondary | ICD-10-CM | POA: Diagnosis not present

## 2017-12-27 DIAGNOSIS — G893 Neoplasm related pain (acute) (chronic): Secondary | ICD-10-CM | POA: Diagnosis not present

## 2017-12-27 DIAGNOSIS — Z7982 Long term (current) use of aspirin: Secondary | ICD-10-CM | POA: Diagnosis not present

## 2017-12-27 DIAGNOSIS — C7951 Secondary malignant neoplasm of bone: Secondary | ICD-10-CM | POA: Diagnosis not present

## 2017-12-27 DIAGNOSIS — D6182 Myelophthisis: Secondary | ICD-10-CM

## 2017-12-27 DIAGNOSIS — Z79899 Other long term (current) drug therapy: Secondary | ICD-10-CM | POA: Diagnosis not present

## 2017-12-27 DIAGNOSIS — Z5111 Encounter for antineoplastic chemotherapy: Secondary | ICD-10-CM | POA: Diagnosis not present

## 2017-12-27 LAB — CMP (CANCER CENTER ONLY)
ALT: 8 U/L (ref 0–44)
AST: 18 U/L (ref 15–41)
Albumin: 3.6 g/dL (ref 3.5–5.0)
Alkaline Phosphatase: 166 U/L — ABNORMAL HIGH (ref 38–126)
Anion gap: 6 (ref 5–15)
BUN: 24 mg/dL — ABNORMAL HIGH (ref 8–23)
CHLORIDE: 107 mmol/L (ref 98–111)
CO2: 24 mmol/L (ref 22–32)
CREATININE: 1.63 mg/dL — AB (ref 0.61–1.24)
Calcium: 8.5 mg/dL — ABNORMAL LOW (ref 8.9–10.3)
GFR, EST AFRICAN AMERICAN: 43 mL/min — AB (ref >60)
GFR, EST NON AFRICAN AMERICAN: 37 mL/min — AB (ref >60)
Glucose, Bld: 98 mg/dL (ref 70–99)
POTASSIUM: 5 mmol/L (ref 3.5–5.1)
SODIUM: 137 mmol/L (ref 135–145)
Total Bilirubin: 0.6 mg/dL (ref 0.3–1.2)
Total Protein: 6.8 g/dL (ref 6.5–8.1)

## 2017-12-27 LAB — CBC WITH DIFFERENTIAL/PLATELET
BASOS PCT: 0 %
Basophils Absolute: 0 10*3/uL (ref 0.0–0.1)
EOS ABS: 0.1 10*3/uL (ref 0.0–0.5)
Eosinophils Relative: 1 %
HCT: 30.7 % — ABNORMAL LOW (ref 38.4–49.9)
Hemoglobin: 9.5 g/dL — ABNORMAL LOW (ref 13.0–17.1)
LYMPHS ABS: 1.5 10*3/uL (ref 0.9–3.3)
Lymphocytes Relative: 27 %
MCH: 31.3 pg (ref 27.2–33.4)
MCHC: 30.9 g/dL — ABNORMAL LOW (ref 32.0–36.0)
MCV: 101 fL — AB (ref 79.3–98.0)
MONOS PCT: 11 %
Monocytes Absolute: 0.6 10*3/uL (ref 0.1–0.9)
NRBC: 1 /100{WBCs} — AB
Neutro Abs: 3.4 10*3/uL (ref 1.5–6.5)
Neutrophils Relative %: 61 %
PLATELETS: 186 10*3/uL (ref 140–400)
RBC: 3.04 MIL/uL — AB (ref 4.20–5.82)
RDW: 16 % — AB (ref 11.0–14.6)
WBC: 5.7 10*3/uL (ref 4.0–10.3)

## 2017-12-27 LAB — IRON AND TIBC
Iron: 86 ug/dL (ref 42–163)
SATURATION RATIOS: 55 % (ref 42–163)
TIBC: 156 ug/dL — AB (ref 202–409)
UIBC: 71 ug/dL

## 2017-12-27 LAB — SAMPLE TO BLOOD BANK

## 2017-12-27 LAB — FERRITIN: Ferritin: 1813 ng/mL — ABNORMAL HIGH (ref 24–336)

## 2017-12-27 MED ORDER — DENOSUMAB 120 MG/1.7ML ~~LOC~~ SOLN
120.0000 mg | Freq: Once | SUBCUTANEOUS | Status: AC
  Administered 2017-12-27: 120 mg via SUBCUTANEOUS

## 2017-12-27 NOTE — Patient Instructions (Signed)
Take 2 Tums daily for one week.  Begin today.      Denosumab injection What is this medicine? DENOSUMAB (den oh sue mab) slows bone breakdown. Prolia is used to treat osteoporosis in women after menopause and in men. Xgeva is used to treat a high calcium level due to cancer and to prevent bone fractures and other bone problems caused by multiple myeloma or cancer bone metastases. Xgeva is also used to treat giant cell tumor of the bone. This medicine may be used for other purposes; ask your health care provider or pharmacist if you have questions. COMMON BRAND NAME(S): Prolia, XGEVA What should I tell my health care provider before I take this medicine? They need to know if you have any of these conditions: -dental disease -having surgery or tooth extraction -infection -kidney disease -low levels of calcium or Vitamin D in the blood -malnutrition -on hemodialysis -skin conditions or sensitivity -thyroid or parathyroid disease -an unusual reaction to denosumab, other medicines, foods, dyes, or preservatives -pregnant or trying to get pregnant -breast-feeding How should I use this medicine? This medicine is for injection under the skin. It is given by a health care professional in a hospital or clinic setting. If you are getting Prolia, a special MedGuide will be given to you by the pharmacist with each prescription and refill. Be sure to read this information carefully each time. For Prolia, talk to your pediatrician regarding the use of this medicine in children. Special care may be needed. For Xgeva, talk to your pediatrician regarding the use of this medicine in children. While this drug may be prescribed for children as young as 13 years for selected conditions, precautions do apply. Overdosage: If you think you have taken too much of this medicine contact a poison control center or emergency room at once. NOTE: This medicine is only for you. Do not share this medicine with  others. What if I miss a dose? It is important not to miss your dose. Call your doctor or health care professional if you are unable to keep an appointment. What may interact with this medicine? Do not take this medicine with any of the following medications: -other medicines containing denosumab This medicine may also interact with the following medications: -medicines that lower your chance of fighting infection -steroid medicines like prednisone or cortisone This list may not describe all possible interactions. Give your health care provider a list of all the medicines, herbs, non-prescription drugs, or dietary supplements you use. Also tell them if you smoke, drink alcohol, or use illegal drugs. Some items may interact with your medicine. What should I watch for while using this medicine? Visit your doctor or health care professional for regular checks on your progress. Your doctor or health care professional may order blood tests and other tests to see how you are doing. Call your doctor or health care professional for advice if you get a fever, chills or sore throat, or other symptoms of a cold or flu. Do not treat yourself. This drug may decrease your body's ability to fight infection. Try to avoid being around people who are sick. You should make sure you get enough calcium and vitamin D while you are taking this medicine, unless your doctor tells you not to. Discuss the foods you eat and the vitamins you take with your health care professional. See your dentist regularly. Brush and floss your teeth as directed. Before you have any dental work done, tell your dentist you are receiving this   medicine. Do not become pregnant while taking this medicine or for 5 months after stopping it. Talk with your doctor or health care professional about your birth control options while taking this medicine. Women should inform their doctor if they wish to become pregnant or think they might be pregnant. There  is a potential for serious side effects to an unborn child. Talk to your health care professional or pharmacist for more information. What side effects may I notice from receiving this medicine? Side effects that you should report to your doctor or health care professional as soon as possible: -allergic reactions like skin rash, itching or hives, swelling of the face, lips, or tongue -bone pain -breathing problems -dizziness -jaw pain, especially after dental work -redness, blistering, peeling of the skin -signs and symptoms of infection like fever or chills; cough; sore throat; pain or trouble passing urine -signs of low calcium like fast heartbeat, muscle cramps or muscle pain; pain, tingling, numbness in the hands or feet; seizures -unusual bleeding or bruising -unusually weak or tired Side effects that usually do not require medical attention (report to your doctor or health care professional if they continue or are bothersome): -constipation -diarrhea -headache -joint pain -loss of appetite -muscle pain -runny nose -tiredness -upset stomach This list may not describe all possible side effects. Call your doctor for medical advice about side effects. You may report side effects to FDA at 1-800-FDA-1088. Where should I keep my medicine? This medicine is only given in a clinic, doctor's office, or other health care setting and will not be stored at home. NOTE: This sheet is a summary. It may not cover all possible information. If you have questions about this medicine, talk to your doctor, pharmacist, or health care provider.  2018 [employer] Standard (2016-05-09 19:17:21)  

## 2017-12-27 NOTE — Telephone Encounter (Signed)
Pt's daughter r/s appts.

## 2017-12-27 NOTE — Progress Notes (Signed)
Pt.'s calcium level 8.5 today. Per Dr. [name] ok to get injection, instructed Pt. To take tum's when he gets home. Pt. Tolerated injection well, No further problems or concerns noted.

## 2017-12-28 ENCOUNTER — Telehealth: Payer: Self-pay

## 2017-12-28 LAB — VITAMIN D 25 HYDROXY (VIT D DEFICIENCY, FRACTURES): VIT D 25 HYDROXY: 51.5 ng/mL (ref 30.0–100.0)

## 2017-12-28 NOTE — Telephone Encounter (Signed)
Spoke with patient daughter concerning her upcoming appointment.per 8/30 los After going over each appointment with the patient daughter. Will mail a letter with a calender enclosed.per 8/29 los

## 2018-01-04 ENCOUNTER — Telehealth: Payer: Self-pay | Admitting: *Deleted

## 2018-01-04 ENCOUNTER — Other Ambulatory Visit: Payer: Self-pay | Admitting: Hematology

## 2018-01-04 MED ORDER — FENTANYL 25 MCG/HR TD PT72: 25.0000 ug | MEDICATED_PATCH | TRANSDERMAL | 0 refills | Status: DC

## 2018-01-04 NOTE — Telephone Encounter (Signed)
11:20 am  Received call from pt's daughter, requesting refill of his fentanyl patch. It was last filled on 11/29/17 and she , the daughter-[name] Jones, needs to pick up the prescription today. Sheplaced the last patch on the patient yesterday and will need the refill for Sunday. Please call [facility] @ [phone] when the prescription is ready for pick up.

## 2018-01-05 ENCOUNTER — Other Ambulatory Visit: Payer: Self-pay | Admitting: Family Medicine

## 2018-01-21 DIAGNOSIS — R0609 Other forms of dyspnea: Secondary | ICD-10-CM | POA: Diagnosis not present

## 2018-01-21 DIAGNOSIS — R0600 Dyspnea, unspecified: Secondary | ICD-10-CM | POA: Diagnosis not present

## 2018-01-24 ENCOUNTER — Other Ambulatory Visit: Payer: Medicare Other

## 2018-01-24 ENCOUNTER — Ambulatory Visit: Payer: Medicare Other | Admitting: Hematology

## 2018-01-24 ENCOUNTER — Inpatient Hospital Stay: Payer: Medicare Other | Attending: Hematology

## 2018-01-24 ENCOUNTER — Inpatient Hospital Stay: Payer: Medicare Other

## 2018-01-24 VITALS — BP 145/74 | HR 70 | Temp 98.4°F | Resp 18

## 2018-01-24 DIAGNOSIS — C7951 Secondary malignant neoplasm of bone: Secondary | ICD-10-CM | POA: Insufficient documentation

## 2018-01-24 DIAGNOSIS — C61 Malignant neoplasm of prostate: Secondary | ICD-10-CM | POA: Diagnosis present

## 2018-01-24 LAB — CMP (CANCER CENTER ONLY)
ALK PHOS: 133 U/L — AB (ref 38–126)
ALT: 14 U/L (ref 0–44)
AST: 20 U/L (ref 15–41)
Albumin: 3.5 g/dL (ref 3.5–5.0)
Anion gap: 3 — ABNORMAL LOW (ref 5–15)
BILIRUBIN TOTAL: 0.6 mg/dL (ref 0.3–1.2)
BUN: 21 mg/dL (ref 8–23)
CO2: 25 mmol/L (ref 22–32)
Calcium: 8.3 mg/dL — ABNORMAL LOW (ref 8.9–10.3)
Chloride: 111 mmol/L (ref 98–111)
Creatinine: 1.64 mg/dL — ABNORMAL HIGH (ref 0.61–1.24)
GFR, Est AFR Am: 42 mL/min — ABNORMAL LOW (ref >60)
GFR, Estimated: 37 mL/min — ABNORMAL LOW (ref >60)
GLUCOSE: 97 mg/dL (ref 70–99)
Potassium: 5.2 mmol/L — ABNORMAL HIGH (ref 3.5–5.1)
Sodium: 139 mmol/L (ref 135–145)
TOTAL PROTEIN: 6.4 g/dL — AB (ref 6.5–8.1)

## 2018-01-24 LAB — CBC WITH DIFFERENTIAL/PLATELET
BASOS ABS: 0 10*3/uL (ref 0.0–0.1)
Basophils Relative: 0 %
Eosinophils Absolute: 0.1 10*3/uL (ref 0.0–0.5)
Eosinophils Relative: 1 %
HEMATOCRIT: 27.4 % — AB (ref 38.4–49.9)
Hemoglobin: 8.3 g/dL — ABNORMAL LOW (ref 13.0–17.1)
LYMPHS PCT: 28 %
Lymphs Abs: 1.5 10*3/uL (ref 0.9–3.3)
MCH: 30.9 pg (ref 27.2–33.4)
MCHC: 30.3 g/dL — AB (ref 32.0–36.0)
MCV: 101.9 fL — AB (ref 79.3–98.0)
MONO ABS: 0.6 10*3/uL (ref 0.1–0.9)
MONOS PCT: 11 %
NEUTROS ABS: 3.3 10*3/uL (ref 1.5–6.5)
Neutrophils Relative %: 60 %
PLATELETS: 185 10*3/uL (ref 140–400)
RBC: 2.69 MIL/uL — ABNORMAL LOW (ref 4.20–5.82)
RDW: 16.6 % — AB (ref 11.0–14.6)
WBC: 5.5 10*3/uL (ref 4.0–10.3)

## 2018-01-24 LAB — SAMPLE TO BLOOD BANK

## 2018-01-24 MED ORDER — DENOSUMAB 120 MG/1.7ML ~~LOC~~ SOLN
SUBCUTANEOUS | Status: AC
  Filled 2018-01-24: qty 1.7

## 2018-01-24 MED ORDER — DENOSUMAB 120 MG/1.7ML ~~LOC~~ SOLN
120.0000 mg | Freq: Once | SUBCUTANEOUS | Status: AC
  Administered 2018-01-24: 120 mg via SUBCUTANEOUS

## 2018-01-24 NOTE — Patient Instructions (Signed)

## 2018-01-24 NOTE — Progress Notes (Signed)
Pt. Here today for lab and injection visit, HGB 8.3 Pt not feeling fatigue or SOB Per Dr. [name] Pt. Will not receive blood transfusion today but instructed if he feels any of these symptoms to call [place] to schedule an appointment. Pt. Also here for xgeva injection calcium 8.3 per Dr. [name] ok to give injection. Pt. Instructed to continue to take tums.

## 2018-01-25 ENCOUNTER — Telehealth: Payer: Self-pay | Admitting: *Deleted

## 2018-01-25 ENCOUNTER — Other Ambulatory Visit: Payer: Self-pay | Admitting: Hematology

## 2018-01-25 MED ORDER — FENTANYL 25 MCG/HR TD PT72: 25.0000 ug | MEDICATED_PATCH | TRANSDERMAL | 0 refills | Status: DC

## 2018-01-25 MED ORDER — OXYCODONE HCL 5 MG PO TABS: 5.0000 mg | ORAL_TABLET | ORAL | 0 refills | Status: DC | PRN

## 2018-01-25 NOTE — Telephone Encounter (Signed)
Contacted patient daughter as follow up to call this AM requesting refill of oxycodone and fentanyl patches. Medication refills have been sent to pharmacy on record. Daughter also states that Alvis Lemmings provides personal care at home to her father, with [facility] administrator [employer]. Per daughter, Alvis Lemmings states pt needs more hours of care. [facility] contacted [facility] and was told that further information was needed from MD office to substantiate need for increased hours of care. Daughter asked if MD office will contact either [facility] or [facility] to provide information. Gave her the office fax number and encouraged her to ask either/both agencies to contact MD office for specific information needed.

## 2018-01-28 ENCOUNTER — Telehealth: Payer: Self-pay | Admitting: Hematology

## 2018-01-28 ENCOUNTER — Other Ambulatory Visit: Payer: Self-pay

## 2018-01-28 DIAGNOSIS — D649 Anemia, unspecified: Secondary | ICD-10-CM

## 2018-01-28 NOTE — Telephone Encounter (Signed)
Scheduled appt per 9/30 sch message- pt daughter is aware of appt date and time.

## 2018-01-28 NOTE — Telephone Encounter (Signed)
Scheduled appt per 9/30 sch message - next available 10/4

## 2018-02-01 ENCOUNTER — Inpatient Hospital Stay: Payer: Medicare Other

## 2018-02-01 ENCOUNTER — Inpatient Hospital Stay: Payer: Medicare Other | Attending: Hematology

## 2018-02-01 DIAGNOSIS — C61 Malignant neoplasm of prostate: Secondary | ICD-10-CM | POA: Insufficient documentation

## 2018-02-01 DIAGNOSIS — Z7982 Long term (current) use of aspirin: Secondary | ICD-10-CM | POA: Insufficient documentation

## 2018-02-01 DIAGNOSIS — M109 Gout, unspecified: Secondary | ICD-10-CM | POA: Diagnosis not present

## 2018-02-01 DIAGNOSIS — D649 Anemia, unspecified: Secondary | ICD-10-CM

## 2018-02-01 DIAGNOSIS — Z955 Presence of coronary angioplasty implant and graft: Secondary | ICD-10-CM | POA: Diagnosis not present

## 2018-02-01 DIAGNOSIS — Z87891 Personal history of nicotine dependence: Secondary | ICD-10-CM | POA: Diagnosis not present

## 2018-02-01 DIAGNOSIS — Z79899 Other long term (current) drug therapy: Secondary | ICD-10-CM | POA: Diagnosis not present

## 2018-02-01 DIAGNOSIS — I252 Old myocardial infarction: Secondary | ICD-10-CM | POA: Insufficient documentation

## 2018-02-01 DIAGNOSIS — Z79818 Long term (current) use of other agents affecting estrogen receptors and estrogen levels: Secondary | ICD-10-CM | POA: Insufficient documentation

## 2018-02-01 DIAGNOSIS — I10 Essential (primary) hypertension: Secondary | ICD-10-CM | POA: Diagnosis not present

## 2018-02-01 DIAGNOSIS — G893 Neoplasm related pain (acute) (chronic): Secondary | ICD-10-CM | POA: Insufficient documentation

## 2018-02-01 DIAGNOSIS — C7951 Secondary malignant neoplasm of bone: Secondary | ICD-10-CM | POA: Insufficient documentation

## 2018-02-01 DIAGNOSIS — Z8673 Personal history of transient ischemic attack (TIA), and cerebral infarction without residual deficits: Secondary | ICD-10-CM | POA: Diagnosis not present

## 2018-02-01 DIAGNOSIS — E785 Hyperlipidemia, unspecified: Secondary | ICD-10-CM | POA: Insufficient documentation

## 2018-02-01 LAB — CBC WITH DIFFERENTIAL (CANCER CENTER ONLY)
Basophils Absolute: 0 10*3/uL (ref 0.0–0.1)
Basophils Relative: 0 %
EOS PCT: 1 %
Eosinophils Absolute: 0.1 10*3/uL (ref 0.0–0.5)
HEMATOCRIT: 26.1 % — AB (ref 38.4–49.9)
Hemoglobin: 8.1 g/dL — ABNORMAL LOW (ref 13.0–17.1)
LYMPHS ABS: 1.6 10*3/uL (ref 0.9–3.3)
LYMPHS PCT: 31 %
MCH: 30.7 pg (ref 27.2–33.4)
MCHC: 31 g/dL — AB (ref 32.0–36.0)
MCV: 98.9 fL — AB (ref 79.3–98.0)
MONO ABS: 0.5 10*3/uL (ref 0.1–0.9)
Monocytes Relative: 10 %
Neutro Abs: 3 10*3/uL (ref 1.5–6.5)
Neutrophils Relative %: 58 %
Platelet Count: 194 10*3/uL (ref 140–400)
RBC: 2.64 MIL/uL — ABNORMAL LOW (ref 4.20–5.82)
RDW: 16.3 % — AB (ref 11.0–14.6)
WBC Count: 5.1 10*3/uL (ref 4.0–10.3)
nRBC: 1 /100 WBC — ABNORMAL HIGH

## 2018-02-01 LAB — PREPARE RBC (CROSSMATCH)

## 2018-02-01 MED ORDER — ACETAMINOPHEN 325 MG PO TABS
650.0000 mg | ORAL_TABLET | Freq: Once | ORAL | Status: AC
  Administered 2018-02-01: 650 mg via ORAL

## 2018-02-01 MED ORDER — DIPHENHYDRAMINE HCL 25 MG PO CAPS
25.0000 mg | ORAL_CAPSULE | Freq: Once | ORAL | Status: AC
  Administered 2018-02-01: 25 mg via ORAL

## 2018-02-01 MED ORDER — FUROSEMIDE 10 MG/ML IJ SOLN
INTRAMUSCULAR | Status: AC
  Filled 2018-02-01: qty 2

## 2018-02-01 MED ORDER — ACETAMINOPHEN 325 MG PO TABS
ORAL_TABLET | ORAL | Status: AC
  Filled 2018-02-01: qty 2

## 2018-02-01 MED ORDER — DIPHENHYDRAMINE HCL 25 MG PO CAPS
ORAL_CAPSULE | ORAL | Status: AC
  Filled 2018-02-01: qty 1

## 2018-02-01 MED ORDER — SODIUM CHLORIDE 0.9% IV SOLUTION
250.0000 mL | Freq: Once | INTRAVENOUS | Status: AC
  Administered 2018-02-01: 250 mL via INTRAVENOUS
  Filled 2018-02-01: qty 250

## 2018-02-01 MED ORDER — FUROSEMIDE 10 MG/ML IJ SOLN
20.0000 mg | Freq: Once | INTRAMUSCULAR | Status: AC
  Administered 2018-02-01: 20 mg via INTRAVENOUS

## 2018-02-01 NOTE — Patient Instructions (Signed)

## 2018-02-04 LAB — TYPE AND SCREEN
ABO/RH(D): O POS
ANTIBODY SCREEN: NEGATIVE
UNIT DIVISION: 0
Unit division: 0

## 2018-02-04 LAB — BPAM RBC
Blood Product Expiration Date: 201910312359
Blood Product Expiration Date: 201910312359
ISSUE DATE / TIME: 201910041256
ISSUE DATE / TIME: 201910041256
UNIT TYPE AND RH: 5100
Unit Type and Rh: 5100

## 2018-02-20 ENCOUNTER — Inpatient Hospital Stay: Payer: Medicare Other

## 2018-02-20 ENCOUNTER — Inpatient Hospital Stay (HOSPITAL_BASED_OUTPATIENT_CLINIC_OR_DEPARTMENT_OTHER): Payer: Medicare Other | Admitting: Hematology

## 2018-02-20 VITALS — BP 145/72 | HR 72 | Temp 98.1°F | Resp 18 | Ht 67.0 in | Wt 178.5 lb

## 2018-02-20 DIAGNOSIS — Z8673 Personal history of transient ischemic attack (TIA), and cerebral infarction without residual deficits: Secondary | ICD-10-CM | POA: Diagnosis not present

## 2018-02-20 DIAGNOSIS — C61 Malignant neoplasm of prostate: Secondary | ICD-10-CM | POA: Diagnosis not present

## 2018-02-20 DIAGNOSIS — G893 Neoplasm related pain (acute) (chronic): Secondary | ICD-10-CM | POA: Diagnosis not present

## 2018-02-20 DIAGNOSIS — C7951 Secondary malignant neoplasm of bone: Secondary | ICD-10-CM

## 2018-02-20 DIAGNOSIS — Z955 Presence of coronary angioplasty implant and graft: Secondary | ICD-10-CM | POA: Diagnosis not present

## 2018-02-20 DIAGNOSIS — D6182 Myelophthisis: Secondary | ICD-10-CM

## 2018-02-20 DIAGNOSIS — R0600 Dyspnea, unspecified: Secondary | ICD-10-CM | POA: Diagnosis not present

## 2018-02-20 DIAGNOSIS — E785 Hyperlipidemia, unspecified: Secondary | ICD-10-CM | POA: Diagnosis not present

## 2018-02-20 DIAGNOSIS — Z79818 Long term (current) use of other agents affecting estrogen receptors and estrogen levels: Secondary | ICD-10-CM | POA: Diagnosis not present

## 2018-02-20 DIAGNOSIS — I10 Essential (primary) hypertension: Secondary | ICD-10-CM | POA: Diagnosis not present

## 2018-02-20 DIAGNOSIS — Z79899 Other long term (current) drug therapy: Secondary | ICD-10-CM

## 2018-02-20 DIAGNOSIS — Z7982 Long term (current) use of aspirin: Secondary | ICD-10-CM | POA: Diagnosis not present

## 2018-02-20 DIAGNOSIS — Z87891 Personal history of nicotine dependence: Secondary | ICD-10-CM | POA: Diagnosis not present

## 2018-02-20 DIAGNOSIS — M109 Gout, unspecified: Secondary | ICD-10-CM | POA: Diagnosis not present

## 2018-02-20 DIAGNOSIS — I252 Old myocardial infarction: Secondary | ICD-10-CM | POA: Diagnosis not present

## 2018-02-20 LAB — CBC WITH DIFFERENTIAL/PLATELET
Abs Immature Granulocytes: 0.02 10*3/uL (ref 0.00–0.07)
Basophils Absolute: 0 10*3/uL (ref 0.0–0.1)
Basophils Relative: 0 %
EOS PCT: 1 %
Eosinophils Absolute: 0.1 10*3/uL (ref 0.0–0.5)
HEMATOCRIT: 34.7 % — AB (ref 39.0–52.0)
Hemoglobin: 10.5 g/dL — ABNORMAL LOW (ref 13.0–17.0)
Immature Granulocytes: 0 %
LYMPHS ABS: 1.4 10*3/uL (ref 0.7–4.0)
LYMPHS PCT: 28 %
MCH: 30.2 pg (ref 26.0–34.0)
MCHC: 30.3 g/dL (ref 30.0–36.0)
MCV: 99.7 fL (ref 80.0–100.0)
MONO ABS: 0.4 10*3/uL (ref 0.1–1.0)
Monocytes Relative: 9 %
Neutro Abs: 3 10*3/uL (ref 1.7–7.7)
Neutrophils Relative %: 62 %
Platelets: 205 10*3/uL (ref 150–400)
RBC: 3.48 MIL/uL — ABNORMAL LOW (ref 4.22–5.81)
RDW: 16.2 % — ABNORMAL HIGH (ref 11.5–15.5)
WBC: 4.9 10*3/uL (ref 4.0–10.5)
nRBC: 0 % (ref 0.0–0.2)

## 2018-02-20 LAB — CMP (CANCER CENTER ONLY)
ALBUMIN: 3.8 g/dL (ref 3.5–5.0)
ALK PHOS: 129 U/L — AB (ref 38–126)
ALT: 9 U/L (ref 0–44)
AST: 15 U/L (ref 15–41)
Anion gap: 9 (ref 5–15)
BILIRUBIN TOTAL: 1 mg/dL (ref 0.3–1.2)
BUN: 19 mg/dL (ref 8–23)
CALCIUM: 8.7 mg/dL — AB (ref 8.9–10.3)
CO2: 24 mmol/L (ref 22–32)
Chloride: 107 mmol/L (ref 98–111)
Creatinine: 1.72 mg/dL — ABNORMAL HIGH (ref 0.61–1.24)
GFR, EST NON AFRICAN AMERICAN: 34 mL/min — AB (ref >60)
GFR, Est AFR Am: 40 mL/min — ABNORMAL LOW (ref >60)
GLUCOSE: 98 mg/dL (ref 70–99)
Potassium: 4.6 mmol/L (ref 3.5–5.1)
Sodium: 140 mmol/L (ref 135–145)
TOTAL PROTEIN: 7.3 g/dL (ref 6.5–8.1)

## 2018-02-20 LAB — SAMPLE TO BLOOD BANK

## 2018-02-20 MED ORDER — VITAMIN D (ERGOCALCIFEROL) 1.25 MG (50000 UNIT) PO CAPS: ORAL_CAPSULE | ORAL | 0 refills | Status: DC

## 2018-02-20 MED ORDER — DENOSUMAB 120 MG/1.7ML ~~LOC~~ SOLN
120.0000 mg | Freq: Once | SUBCUTANEOUS | Status: AC
  Administered 2018-02-20: 120 mg via SUBCUTANEOUS

## 2018-02-20 MED ORDER — LEUPROLIDE ACETATE (3 MONTH) 22.5 MG IM KIT
22.5000 mg | PACK | Freq: Once | INTRAMUSCULAR | Status: AC
  Administered 2018-02-20: 22.5 mg via INTRAMUSCULAR

## 2018-02-20 MED ORDER — LEUPROLIDE ACETATE (3 MONTH) 22.5 MG IM KIT
PACK | INTRAMUSCULAR | Status: AC
  Filled 2018-02-20: qty 22.5

## 2018-02-20 MED ORDER — DENOSUMAB 120 MG/1.7ML ~~LOC~~ SOLN
SUBCUTANEOUS | Status: AC
  Filled 2018-02-20: qty 1.7

## 2018-02-20 NOTE — Progress Notes (Signed)
HEMATOLOGY/ONCOLOGY CLINIC NOTE  Date of Service: 02/20/18    Patient Care Team: Tony Barrack, MD as PCP - General (Family Medicine)  CHIEF COMPLAINTS/PURPOSE OF CONSULTATION:  -recently diagnosed metastatic prostate cancer -Myelopthisic anemia  HISTORY OF PRESENTING ILLNESS:   Tony [name] is a wonderful 84 y.o. male who has been referred to [place] by Dr Tony [name] for evaluation and management of anemia. He is accompanied today by his daughter. The pt reports that he is doing well overall.   The pt reports a new onset of fatigue that began in January 2019. His daughter notes being able to tell a difference in his energy levels as early as November 2018. He notes that some cramping pain in his thighs. He notes that his recent 07/23/17 blood transfusion has led to a "terrific, immediate change" in his thigh pain. However, his thigh pain has returned in the last couple days.  He notes that he takes 1035mg Vitamin B12 daily.   He notes that for 6-7 years he took iron pills after his 2012 heart attack and stroke. He notes that he took a statin for 6-7 months and was taken off of it recently. He notes no unresolved symptoms from his stroke.   The pt notes that over the last 4-5 months his appetite has diminished and he has subsequently lost about 20 lbs in that time.  He notes that he has historically not preferred to drink water as such, but hydrates with juice, coffee, and other drinks.  He notes that he believes that he is able to take care of himself adequately at home. He also notes that his cane is sufficient for helping him get around.   Most recent lab results (07/22/17) of CBC  is as follows: all values are WNL except for RBC at 2.94, Hgb at 8.7, HCT at 27.2, RDW at 26.5, Platelets at 134k. CBC from 10/30/16 revealed all values WNL.  CMP 07/21/17 revealed all values WNL except for Sodium at 134, Glucose at 101, Creatinine at 1.85, Calcium at 7.9, Total Protein at 5.7, Albumin at  2.7, Alk Phos at 433.  LDH 07/20/17 elevated at 256. Haptoglobin 07/20/17 elevated at 357.  Vitamin B12 07/06/17 is WNL at 229.   On review of systems, pt reports decreased appetite, losing 20 lbs over 4-5 months, bilateral thigh pain, fatigue, mild leg swelling, and denies fevers, chills, night sweats, bone pains, nausea, abdominal pains, bleeding, blood in the urine, blood in the stools, black stools, changes in bowel habits, light headednss, dizziness, abdominal pains, noticing any new lumps or bumps, testicular pain or swelling, and any other symptoms.   On PMHx the pt reports heart attack and stroke in 2012. On Social Hx the pt denies much ETOH consumption.   Interval History:  Mr. Tony Hausmannreturns today regarding his metastatic prostate cancer and myelopthisic anemia. The patient's last [place] on [place]. He is accompanied today by his daughter. The pt reports that he is doing well overall.   The pt reports that he has been feeling better after his last transfusion about 4 weeks ago. He notes that he was able to speak again at church as a result of improved energy levels. He denies any uncontrolled pain or problems passing urine. He notes that his appetite has continued to be somewhat weak but has still been able to eat well. The pt notes that he has been more active in the last few weeks.   The  pt notes that his previous acute back pain has not been present.   The pt notes that he has completed his at home physical therapy. He has been walking with the help of a walker and notes that he is continuing to make progress.   Lab results today (02/20/18) of CBC w/diff, CMP is as follows: all values are WNL except for RBC at 3.48, HGB at 10.5, HCT at 34.7, RDW at 16.2, Creatinine at 1.72, Calcium at 8.7, Alk Phos at 129, GFR at 40. 02/20/18 PSA is 53.3.  On review of systems, pt reports eating well though weak appetite, improved energy levels, staying active, stable ankle swelling,  and denies uncontrolled pain, problems passing urine, fatigue, and any other symptoms.   MEDICAL HISTORY:  Past Medical History:  Diagnosis Date  . BPH associated with nocturia    flomax 0.4--> 0.8 mg trial. nocturia if has coffee. some incontinence  . CAD (coronary artery disease)    LAD Stent 2012. 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   . Gout    no rx. apparently 1x in past  . History of stroke    no aspirin before stroke. no deficits. slurred words at time of stroke  . Hyperlipidemia   . Hypertension     SURGICAL HISTORY: Past Surgical History:  Procedure Laterality Date  . CARDIAC SURGERY     Stint  . CORONARY STENT PLACEMENT    . left arm fracture s/p surgery    . right leg fracture s.p surgery- screws like arm      SOCIAL HISTORY: Social History   Socioeconomic History  . Marital status: Married    Spouse name: Not on file  . Number of children: Not on file  . Years of education: Not on file  . Highest education level: Not on file  Occupational History  . Not on file  Social Needs  . Financial resource strain: Not on file  . Food insecurity:    Worry: Not on file    Inability: Not on file  . Transportation needs:    Medical: Not on file    Non-medical: Not on file  Tobacco Use  . Smoking status: Former Smoker    Packs/day: 0.50    Years: 1.00    Pack years: 0.50    Types: Cigarettes    Last attempt to quit: 05/01/1952    Years since quitting: 65.8  . Smokeless tobacco: Never Used  Substance and Sexual Activity  . Alcohol use: No  . Drug use: No  . Sexual activity: Not on file  Lifestyle  . Physical activity:    Days per week: Not on file    Minutes per session: Not on file  . Stress: Not on file  Relationships  . Social connections:    Talks on phone: Not on file    Gets together: Not on file    Attends religious service: Not on file    Active member of club or organization: Not on file    Attends meetings of clubs or  organizations: Not on file    Relationship status: Not on file  . Intimate partner violence:    Fear of current or ex partner: Not on file    Emotionally abused: Not on file    Physically abused: Not on file    Forced sexual activity: Not on file  Other Topics Concern  . Not on file  Social History Narrative   Married- lives  separate from wife. 2 children. Daughter passed from heart attack.       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 Breast cancer Mother   . Heart disease Father   . Heart disease Brother        x2  . Prostate cancer Brother     ALLERGIES:  has No Known Allergies.  MEDICATIONS:  Current Outpatient Medications  Medication Sig Dispense Refill  . aspirin EC 81 MG tablet Take 1 tablet (81 mg total) by mouth daily. 90 tablet 3  . calcium-vitamin D (OSCAL 500/200 D-3) 500-200 MG-UNIT tablet Take 1 tablet by mouth daily with breakfast. 30 tablet 2  . fentaNYL (DURAGESIC - DOSED MCG/HR) 25 MCG/HR patch Place 1 patch (25 mcg total) onto the skin every 3 (three) days. 10 patch 0  . isosorbide-hydrALAZINE (BIDIL) 20-37.5 MG tablet Take 1 tablet by mouth 2 (two) times daily.    . isosorbide-hydrALAZINE (BIDIL) 20-37.5 MG tablet Take 1 tablet by mouth 2 (two) times daily. 60 tablet 0  . metoprolol succinate (TOPROL-XL) 25 MG 24 hr tablet TAKE 1 TABLET(25 MG) BY MOUTH DAILY 90 tablet 0  . Multiple Vitamins-Minerals (MULTI COMPLETE PO) Take by mouth.    . nitroGLYCERIN (NITROSTAT) 0.4 MG SL tablet Place 1 tablet (0.4 mg total) under the tongue every 5 (five) minutes as needed for chest pain (3 maximum before seeking care). 30 tablet 0  . Omega-3 1000 MG CAPS Take 1 capsule by mouth daily.     Marland Kitchen oxyCODONE (OXY IR/ROXICODONE) 5 MG immediate release tablet Take 1 tablet (5 mg total) by mouth every 4 (four) hours as needed for severe pain. 90 tablet 0  . polyethylene glycol  (MIRALAX) packet Take 17 g by mouth daily. 30 each 1  . senna-docusate (SENNA S) 8.6-50 MG tablet Take 2 tablets by mouth 2 (two) times daily. 120 tablet 2  . tamsulosin (FLOMAX) 0.4 MG CAPS capsule Take 0.4 mg by mouth daily.    . vitamin B-12 (CYANOCOBALAMIN) 1000 MCG tablet Take 1 tablet (1,000 mcg total) by mouth daily. 30 tablet 0  . Vitamin D, Ergocalciferol, (DRISDOL) 50000 units CAPS capsule TAKE ONE CAPSULE BY MOUTH TWICE A WEEK 24 capsule 0   No current facility-administered medications for this visit.    Facility-Administered Medications Ordered in Other Visits  Medication Dose Route Frequency Provider Last Rate Last Dose  . denosumab (XGEVA) injection 120 mg  120 mg Subcutaneous Once Brunetta Genera, MD      . leuprolide (LUPRON) injection 22.5 mg  22.5 mg Intramuscular Once Brunetta Genera, MD        REVIEW OF SYSTEMS:    A 10+ POINT REVIEW OF SYSTEMS WAS OBTAINED including neurology, dermatology, psychiatry, cardiac, respiratory, lymph, extremities, GI, GU, Musculoskeletal, constitutional, breasts, reproductive, HEENT.  All pertinent positives are noted in the HPI.  All others are negative.   PHYSICAL EXAMINATION:  Vitals:   02/20/18 1358  BP: (!) 145/72  Pulse: 72  Resp: 18  Temp: 98.1 F (36.7 C)  SpO2: 99%   Filed Weights   02/20/18 1358  Weight: 178 lb 8 oz (81 kg)   .Body mass index is 27.96 kg/m.  GENERAL:alert, in no acute distress and comfortable SKIN: no acute rashes, no significant lesions EYES: conjunctiva are pink and non-injected, sclera anicteric OROPHARYNX: MMM, no exudates, no oropharyngeal erythema or ulceration  NECK: supple, no JVD LYMPH:  no palpable lymphadenopathy in the cervical, axillary or inguinal regions LUNGS: clear to auscultation b/l with normal respiratory effort HEART: regular rate & rhythm ABDOMEN:  normoactive bowel sounds , non tender, not distended. No palpable hepatosplenomegaly.  Extremity: no pedal  edema PSYCH: alert & oriented x 3 with fluent speech NEURO: no focal motor/sensory deficits   LABORATORY DATA:  I have reviewed the data as listed  . CBC Latest Ref Rng & Units 02/20/2018 02/01/2018 01/24/2018  WBC 4.0 - 10.5 K/uL 4.9 5.1 5.5  Hemoglobin 13.0 - 17.0 g/dL 10.5(L) 8.1(L) 8.3(L)  Hematocrit 39.0 - 52.0 % 34.7(L) 26.1(L) 27.4(L)  Platelets 150 - 400 K/uL 205 194 185   . CBC    Component Value Date/Time   WBC 4.9 02/20/2018 1308   RBC 3.48 (L) 02/20/2018 1308   HGB 10.5 (L) 02/20/2018 1308   HGB 8.1 (L) 02/01/2018 0804   HCT 34.7 (L) 02/20/2018 1308   HCT 21.9 (L) 07/20/2017 2103   PLT 205 02/20/2018 1308   PLT 194 02/01/2018 0804   MCV 99.7 02/20/2018 1308   MCH 30.2 02/20/2018 1308   MCHC 30.3 02/20/2018 1308   RDW 16.2 (H) 02/20/2018 1308   LYMPHSABS 1.4 02/20/2018 1308   MONOABS 0.4 02/20/2018 1308   EOSABS 0.1 02/20/2018 1308   BASOSABS 0.0 02/20/2018 1308     . CMP Latest Ref Rng & Units 02/20/2018 01/24/2018 12/27/2017  Glucose 70 - 99 mg/dL 98 97 98  BUN 8 - 23 mg/dL 19 21 24(H)  Creatinine 0.61 - 1.24 mg/dL 1.72(H) 1.64(H) 1.63(H)  Sodium 135 - 145 mmol/L 140 139 137  Potassium 3.5 - 5.1 mmol/L 4.6 5.2(H) 5.0  Chloride 98 - 111 mmol/L 107 111 107  CO2 22 - 32 mmol/L _0 Calcium 8.9 - 10.3 mg/dL 8.7(L) 8.3(L) 8.5(L)  Total Protein 6.5 - 8.1 g/dL 7.3 6.4(L) 6.8  Total Bilirubin 0.3 - 1.2 mg/dL 1.0 0.6 0.6  Alkaline Phos 38 - 126 U/L 129(H) 133(H) 166(H)  AST 15 - 41 U/L _1 ALT 0 - 44 U/L _2 Component     Latest Ref Rng & Units 07/30/2017 09/06/2017 10/04/2017 10/31/2017  Prostate Specific Ag, Serum     0.0 - 4.0 ng/mL 1,186.0 (H) 433.8 (H) 67.4 (H) 55.8 (H)   Component     Latest Ref Rng & Units 11/29/2017  Prostate Specific Ag, Serum     0.0 - 4.0 ng/mL 51.1 (H)   07/31/17 BM Bx:   RADIOGRAPHIC STUDIES: I have personally reviewed the radiological images as listed and agreed with the findings in the report. No results  found.  ASSESSMENT & PLAN:   84 y.o. male with  1. Normocytic Anemia - due to primarily metastatic prostatic cancer (ACD + BM involvement with prostate cancer causing myelopthisic picture)  -Pt presented with a Hgb at 8.7 on 07/22/17, improved to 10.3 after receiving PRBC transfusion. -In review of the patient's previous CBC records, his anemia developed 6-7 months before presenting to care with me, unaccompanied by a drop in his EPO (07/20/17 elevated at 249.8);  LDH elevated  but haptoglobin at 357 on 07/20/17. -suggested against active hemolysis.  2. Recently diagnosed metastatic prostate cancer with extensive bone metastases. With bone mets/BM Mets and LNadenopathy. Exam positive for left retroperitoneal, bilateral pelvis and right inguinal adenopathy. Exam positive for left retroperitoneal, bilateral pelvis and right inguinal adenopathy. Diffuse sclerotic bone metastasis.  3. Elevated alkaline phosphatase due to bone metastases from prostate cancer -CT BM bx -concerning for significant bone lesions in lower spine and pelvis PSA levels have declined from1186, down to 433, down to 67.4, and now 55 to 51 and stable today at 53.3  4. Cancer related pain - primarily in b/l thighs. -Xray b/l femurs today showed no overt measure high risk for fracture lesions -much improved.  PLAN:  -Continue on 17m Aspirin and Flomax  -Regarding the patient's anemia, I havel set up transfusions prn for hgb<8 -Vitamin D and Calcium supplements continue. Improved vit D levels with replacement (up from 14.1 to 49.8). -Nutritional therapy referral to BErnestene Kiel-Discussed pt labwork today, 02/20/18; HGB improved to 10.5,Calcium improved to 8.7, Alk Phos improved to 129, blood counts and chemistries are otherwise stable  -02/20/18 PSA is 53.3- will continue to monitor -Continue Xgeva injection every 4 weeks  -Recommended that the pt continue to eat well, drink at least 48-64 oz of water each day, and walk  20-30 minutes each day.  -Continue elevating feet for mild ankle swelling  -Continue Lupron every 12 weeks  -Will see the pt back in 2 months   5. High risk for decubitus ulcers and having difficult with thigh pains that limit mobility. -hospital bed -limited ambulation    Continue XMarchelle Folkswith labs monthly Continue Lupron q12weeks  RTC with Dr KIrene Limboin 2 months with labs    All of the patients questions were answered with apparent satisfaction. The patient knows to call the clinic with any problems, questions or concerns.  The total time spent in the appt was 25 minutes and more than 50% was on counseling and direct patient cares.     GSullivan LoneMD MS AAHIVMS SCrosstown Surgery Center LLCCMt Carmel [facility] Surgical HospitalHematology/Oncology Physician CFort [facility] (Office):       [phone](Work cell):  [phone](Fax):           [phone] 02/20/2018 2:46 PM  I, SBaldwin Jamaica am acting as a scribe for Dr. KIrene Limbo .I have reviewed the above documentation for accuracy and completeness, and I agree with the above. .Brunetta GeneraMD

## 2018-02-20 NOTE — Patient Instructions (Signed)
Take 2 Tums daily for one week.  Begin today.   Leuprolide injection What is this medicine? LEUPROLIDE (loo PROE lide) is a man-made hormone. It is used to treat the symptoms of prostate cancer. This medicine may also be used to treat children with early onset of puberty. It may be used for other hormonal conditions. This medicine may be used for other purposes; ask your health care provider or pharmacist if you have questions. COMMON BRAND NAME(S): Lupron What should I tell my health care provider before I take this medicine? They need to know if you have any of these conditions: -diabetes -heart disease or previous heart attack -high blood pressure -high cholesterol -pain or difficulty passing urine -spinal cord metastasis -stroke -tobacco smoker -an unusual or allergic reaction to leuprolide, benzyl alcohol, other medicines, foods, dyes, or preservatives -pregnant or trying to get pregnant -breast-feeding How should I use this medicine? This medicine is for injection under the skin or into a muscle. You will be taught how to prepare and give this medicine. Use exactly as directed. Take your medicine at regular intervals. Do not take your medicine more often than directed. It is important that you put your used needles and syringes in a special sharps container. Do not put them in a trash can. If you do not have a sharps container, call your pharmacist or healthcare provider to get one. A special MedGuide will be given to you by the pharmacist with each prescription and refill. Be sure to read this information carefully each time. Talk to your pediatrician regarding the use of this medicine in children. While this medicine may be prescribed for children as young as 8 years for selected conditions, precautions do apply. Overdosage: If you think you have taken too much of this medicine contact a poison control center or emergency room at once. NOTE: This medicine is only for you. Do not  share this medicine with others. What if I miss a dose? If you miss a dose, take it as soon as you can. If it is almost time for your next dose, take only that dose. Do not take double or extra doses. What may interact with this medicine? Do not take this medicine with any of the following medications: -chasteberry This medicine may also interact with the following medications: -herbal or dietary supplements, like black cohosh or DHEA -male hormones, like estrogens or progestins and birth control pills, patches, rings, or injections -male hormones, like testosterone This list may not describe all possible interactions. Give your health care provider a list of all the medicines, herbs, non-prescription drugs, or dietary supplements you use. Also tell them if you smoke, drink alcohol, or use illegal drugs. Some items may interact with your medicine. What should I watch for while using this medicine? Visit your doctor or health care professional for regular checks on your progress. During the first week, your symptoms may get worse, but then will improve as you continue your treatment. You may get hot flashes, increased bone pain, increased difficulty passing urine, or an aggravation of nerve symptoms. Discuss these effects with your doctor or health care professional, some of them may improve with continued use of this medicine. Male patients may experience a menstrual cycle or spotting during the first 2 months of therapy with this medicine. If this continues, contact your doctor or health care professional. What side effects may I notice from receiving this medicine? Side effects that you should report to your doctor or health care  professional as soon as possible: -allergic reactions like skin rash, itching or hives, swelling of the face, lips, or tongue -breathing problems -chest pain -depression or memory disorders -pain in your legs or groin -pain at site where injected -severe  headache -swelling of the feet and legs -visual changes -vomiting Side effects that usually do not require medical attention (report to your doctor or health care professional if they continue or are bothersome): -breast swelling or tenderness -decrease in sex drive or performance -diarrhea -hot flashes -loss of appetite -muscle, joint, or bone pains -nausea -redness or irritation at site where injected -skin problems or acne This list may not describe all possible side effects. Call your doctor for medical advice about side effects. You may report side effects to FDA at 1-800-FDA-1088. Where should I keep my medicine? Keep out of the reach of children. Store below 25 degrees C (77 degrees F). Do not freeze. Protect from light. Do not use if it is not clear or if there are particles present. Throw away any unused medicine after the expiration date. NOTE: This sheet is a summary. It may not cover all possible information. If you have questions about this medicine, talk to your doctor, pharmacist, or health care provider.  2018 [employer]/Gold Standard (2015-12-06 10:54:35)    Denosumab injection What is this medicine? DENOSUMAB (den oh sue mab) slows bone breakdown. Prolia is used to treat osteoporosis in women after menopause and in men. Delton See is used to treat a high calcium level due to cancer and to prevent bone fractures and other bone problems caused by multiple myeloma or cancer bone metastases. Delton See is also used to treat giant cell tumor of the bone. This medicine may be used for other purposes; ask your health care provider or pharmacist if you have questions. COMMON BRAND NAME(S): Prolia, XGEVA What should I tell my health care provider before I take this medicine? They need to know if you have any of these conditions: -dental disease -having surgery or tooth extraction -infection -kidney disease -low levels of calcium or Vitamin D in the blood -malnutrition -on  hemodialysis -skin conditions or sensitivity -thyroid or parathyroid disease -an unusual reaction to denosumab, other medicines, foods, dyes, or preservatives -pregnant or trying to get pregnant -breast-feeding How should I use this medicine? This medicine is for injection under the skin. It is given by a health care professional in a hospital or clinic setting. If you are getting Prolia, a special MedGuide will be given to you by the pharmacist with each prescription and refill. Be sure to read this information carefully each time. For Prolia, talk to your pediatrician regarding the use of this medicine in children. Special care may be needed. For Delton See, talk to your pediatrician regarding the use of this medicine in children. While this drug may be prescribed for children as young as 13 years for selected conditions, precautions do apply. Overdosage: If you think you have taken too much of this medicine contact a poison control center or emergency room at once. NOTE: This medicine is only for you. Do not share this medicine with others. What if I miss a dose? It is important not to miss your dose. Call your doctor or health care professional if you are unable to keep an appointment. What may interact with this medicine? Do not take this medicine with any of the following medications: -other medicines containing denosumab This medicine may also interact with the following medications: -medicines that lower your chance of fighting  infection -steroid medicines like prednisone or cortisone This list may not describe all possible interactions. Give your health care provider a list of all the medicines, herbs, non-prescription drugs, or dietary supplements you use. Also tell them if you smoke, drink alcohol, or use illegal drugs. Some items may interact with your medicine. What should I watch for while using this medicine? Visit your doctor or health care professional for regular checks on your  progress. Your doctor or health care professional may order blood tests and other tests to see how you are doing. Call your doctor or health care professional for advice if you get a fever, chills or sore throat, or other symptoms of a cold or flu. Do not treat yourself. This drug may decrease your body's ability to fight infection. Try to avoid being around people who are sick. You should make sure you get enough calcium and vitamin D while you are taking this medicine, unless your doctor tells you not to. Discuss the foods you eat and the vitamins you take with your health care professional. See your dentist regularly. Brush and floss your teeth as directed. Before you have any dental work done, tell your dentist you are receiving this medicine. Do not become pregnant while taking this medicine or for 5 months after stopping it. Talk with your doctor or health care professional about your birth control options while taking this medicine. Women should inform their doctor if they wish to become pregnant or think they might be pregnant. There is a potential for serious side effects to an unborn child. Talk to your health care professional or pharmacist for more information. What side effects may I notice from receiving this medicine? Side effects that you should report to your doctor or health care professional as soon as possible: -allergic reactions like skin rash, itching or hives, swelling of the face, lips, or tongue -bone pain -breathing problems -dizziness -jaw pain, especially after dental work -redness, blistering, peeling of the skin -signs and symptoms of infection like fever or chills; cough; sore throat; pain or trouble passing urine -signs of low calcium like fast heartbeat, muscle cramps or muscle pain; pain, tingling, numbness in the hands or feet; seizures -unusual bleeding or bruising -unusually weak or tired Side effects that usually do not require medical attention (report to your  doctor or health care professional if they continue or are bothersome): -constipation -diarrhea -headache -joint pain -loss of appetite -muscle pain -runny nose -tiredness -upset stomach This list may not describe all possible side effects. Call your doctor for medical advice about side effects. You may report side effects to FDA at 1-800-FDA-1088. Where should I keep my medicine? This medicine is only given in a clinic, doctor's office, or other health care setting and will not be stored at home. NOTE: This sheet is a summary. It may not cover all possible information. If you have questions about this medicine, talk to your doctor, pharmacist, or health care provider.  2018 [employer]/Gold Standard (2016-05-09 19:17:21)

## 2018-02-21 ENCOUNTER — Ambulatory Visit: Payer: Medicare Other

## 2018-02-21 LAB — PROSTATE-SPECIFIC AG, SERUM (LABCORP): PROSTATE SPECIFIC AG, SERUM: 53.3 ng/mL — AB (ref 0.0–4.0)

## 2018-02-22 ENCOUNTER — Other Ambulatory Visit: Payer: Medicare Other

## 2018-02-22 ENCOUNTER — Ambulatory Visit: Payer: Medicare Other | Admitting: Hematology

## 2018-03-01 ENCOUNTER — Other Ambulatory Visit: Payer: Self-pay | Admitting: Hematology

## 2018-03-01 MED ORDER — OXYCODONE HCL 5 MG PO TABS: 5.0000 mg | ORAL_TABLET | ORAL | 0 refills | Status: DC | PRN

## 2018-03-01 MED ORDER — FENTANYL 25 MCG/HR TD PT72: 25.0000 ug | MEDICATED_PATCH | TRANSDERMAL | 0 refills | Status: DC

## 2018-03-07 ENCOUNTER — Encounter: Payer: Self-pay | Admitting: Family Medicine

## 2018-03-07 ENCOUNTER — Encounter: Payer: Self-pay | Admitting: Hematology

## 2018-03-14 ENCOUNTER — Other Ambulatory Visit: Payer: Self-pay | Admitting: Family Medicine

## 2018-03-21 ENCOUNTER — Inpatient Hospital Stay: Payer: Medicare Other | Attending: Hematology

## 2018-03-21 ENCOUNTER — Inpatient Hospital Stay: Payer: Medicare Other

## 2018-03-21 DIAGNOSIS — G893 Neoplasm related pain (acute) (chronic): Secondary | ICD-10-CM | POA: Insufficient documentation

## 2018-03-21 DIAGNOSIS — Z79899 Other long term (current) drug therapy: Secondary | ICD-10-CM | POA: Diagnosis not present

## 2018-03-21 DIAGNOSIS — Z79818 Long term (current) use of other agents affecting estrogen receptors and estrogen levels: Secondary | ICD-10-CM | POA: Diagnosis not present

## 2018-03-21 DIAGNOSIS — C61 Malignant neoplasm of prostate: Secondary | ICD-10-CM | POA: Insufficient documentation

## 2018-03-21 DIAGNOSIS — Z23 Encounter for immunization: Secondary | ICD-10-CM | POA: Diagnosis not present

## 2018-03-21 DIAGNOSIS — C7951 Secondary malignant neoplasm of bone: Secondary | ICD-10-CM | POA: Insufficient documentation

## 2018-03-21 LAB — CBC WITH DIFFERENTIAL/PLATELET
Abs Immature Granulocytes: 0.06 10*3/uL (ref 0.00–0.07)
Basophils Absolute: 0 10*3/uL (ref 0.0–0.1)
Basophils Relative: 0 %
EOS ABS: 0.1 10*3/uL (ref 0.0–0.5)
Eosinophils Relative: 1 %
HEMATOCRIT: 31.9 % — AB (ref 39.0–52.0)
Hemoglobin: 9.7 g/dL — ABNORMAL LOW (ref 13.0–17.0)
IMMATURE GRANULOCYTES: 1 %
LYMPHS ABS: 1.7 10*3/uL (ref 0.7–4.0)
Lymphocytes Relative: 30 %
MCH: 30.5 pg (ref 26.0–34.0)
MCHC: 30.4 g/dL (ref 30.0–36.0)
MCV: 100.3 fL — AB (ref 80.0–100.0)
MONOS PCT: 10 %
Monocytes Absolute: 0.5 10*3/uL (ref 0.1–1.0)
NEUTROS PCT: 58 %
Neutro Abs: 3.2 10*3/uL (ref 1.7–7.7)
Platelets: 224 10*3/uL (ref 150–400)
RBC: 3.18 MIL/uL — ABNORMAL LOW (ref 4.22–5.81)
RDW: 16.2 % — AB (ref 11.5–15.5)
WBC: 5.6 10*3/uL (ref 4.0–10.5)
nRBC: 0.4 % — ABNORMAL HIGH (ref 0.0–0.2)

## 2018-03-21 LAB — CMP (CANCER CENTER ONLY)
ALK PHOS: 140 U/L — AB (ref 38–126)
ALT: 11 U/L (ref 0–44)
AST: 19 U/L (ref 15–41)
Albumin: 3.6 g/dL (ref 3.5–5.0)
Anion gap: 9 (ref 5–15)
BUN: 21 mg/dL (ref 8–23)
CO2: 23 mmol/L (ref 22–32)
CREATININE: 1.8 mg/dL — AB (ref 0.61–1.24)
Calcium: 8.8 mg/dL — ABNORMAL LOW (ref 8.9–10.3)
Chloride: 107 mmol/L (ref 98–111)
GFR, EST NON AFRICAN AMERICAN: 33 mL/min — AB (ref >60)
GFR, Est AFR Am: 38 mL/min — ABNORMAL LOW (ref >60)
Glucose, Bld: 101 mg/dL — ABNORMAL HIGH (ref 70–99)
Potassium: 5 mmol/L (ref 3.5–5.1)
Sodium: 139 mmol/L (ref 135–145)
Total Bilirubin: 0.7 mg/dL (ref 0.3–1.2)
Total Protein: 7.3 g/dL (ref 6.5–8.1)

## 2018-03-21 LAB — SAMPLE TO BLOOD BANK

## 2018-03-21 MED ORDER — DENOSUMAB 120 MG/1.7ML ~~LOC~~ SOLN
SUBCUTANEOUS | Status: AC
  Filled 2018-03-21: qty 1.7

## 2018-03-21 MED ORDER — INFLUENZA VAC SPLIT QUAD 0.5 ML IM SUSY
PREFILLED_SYRINGE | INTRAMUSCULAR | Status: AC
  Filled 2018-03-21: qty 0.5

## 2018-03-21 MED ORDER — DENOSUMAB 120 MG/1.7ML ~~LOC~~ SOLN
120.0000 mg | Freq: Once | SUBCUTANEOUS | Status: AC
  Administered 2018-03-21: 120 mg via SUBCUTANEOUS

## 2018-03-21 MED ORDER — INFLUENZA VAC SPLIT QUAD 0.5 ML IM SUSY
0.5000 mL | PREFILLED_SYRINGE | Freq: Once | INTRAMUSCULAR | Status: AC
  Administered 2018-03-21: 0.5 mL via INTRAMUSCULAR

## 2018-03-21 NOTE — Patient Instructions (Signed)

## 2018-03-23 DIAGNOSIS — R0600 Dyspnea, unspecified: Secondary | ICD-10-CM | POA: Diagnosis not present

## 2018-04-05 ENCOUNTER — Other Ambulatory Visit: Payer: Self-pay | Admitting: Hematology

## 2018-04-05 ENCOUNTER — Telehealth: Payer: Self-pay | Admitting: *Deleted

## 2018-04-05 MED ORDER — FENTANYL 25 MCG/HR TD PT72: 25.0000 ug | MEDICATED_PATCH | TRANSDERMAL | 0 refills | Status: DC

## 2018-04-05 NOTE — Telephone Encounter (Signed)
Daughter [name] called, requested refill of Fentanyl Patches for father. CB # [phone]

## 2018-04-13 ENCOUNTER — Other Ambulatory Visit: Payer: Self-pay | Admitting: Family Medicine

## 2018-04-17 NOTE — Progress Notes (Signed)
HEMATOLOGY/ONCOLOGY CLINIC NOTE  Date of Service: 04/18/18    Patient Care Team: [name], MD as PCP - General (Family Medicine)  CHIEF COMPLAINTS/PURPOSE OF CONSULTATION:  -recently diagnosed metastatic prostate cancer -Myelopthisic anemia  HISTORY OF PRESENTING ILLNESS:   Tony [name] is a wonderful 84 y.o. male who has been referred to [place] by Dr Dimas Chyle for evaluation and management of anemia. He is accompanied today by his daughter. The pt reports that he is doing well overall.   The pt reports a new onset of fatigue that began in January 2019. His daughter notes being able to tell a difference in his energy levels as early as November 2018. He notes that some cramping pain in his thighs. He notes that his recent 07/23/17 blood transfusion has led to a "terrific, immediate change" in his thigh pain. However, his thigh pain has returned in the last couple days.  He notes that he takes 1040mg Vitamin B12 daily.   He notes that for 6-7 years he took iron pills after his 2012 heart attack and stroke. He notes that he took a statin for 6-7 months and was taken off of it recently. He notes no unresolved symptoms from his stroke.   The pt notes that over the last 4-5 months his appetite has diminished and he has subsequently lost about 20 lbs in that time.  He notes that he has historically not preferred to drink water as such, but hydrates with juice, coffee, and other drinks.  He notes that he believes that he is able to take care of himself adequately at home. He also notes that his cane is sufficient for helping him get around.   Most recent lab results (07/22/17) of CBC  is as follows: all values are WNL except for RBC at 2.94, Hgb at 8.7, HCT at 27.2, RDW at 26.5, Platelets at 134k. CBC from 10/30/16 revealed all values WNL.  CMP 07/21/17 revealed all values WNL except for Sodium at 134, Glucose at 101, Creatinine at 1.85, Calcium at 7.9, Total Protein at 5.7, Albumin at  2.7, Alk Phos at 433.  LDH 07/20/17 elevated at 256. Haptoglobin 07/20/17 elevated at 357.  Vitamin B12 07/06/17 is WNL at 229.   On review of systems, pt reports decreased appetite, losing 20 lbs over 4-5 months, bilateral thigh pain, fatigue, mild leg swelling, and denies fevers, chills, night sweats, bone pains, nausea, abdominal pains, bleeding, blood in the urine, blood in the stools, black stools, changes in bowel habits, light headednss, dizziness, abdominal pains, noticing any new lumps or bumps, testicular pain or swelling, and any other symptoms.   On PMHx the pt reports heart attack and stroke in 2012. On Social Hx the pt denies much ETOH consumption.   Interval History:  Mr. FKamren Heintzelmanreturns today regarding his metastatic prostate cancer and myelopthisic anemia. The patient's last visit with [place] on 02[place]/18. He is accompanied today by his daughter. The pt reports that he is doing well overall.   The pt reports that he has minimal, subtle pain in his right knee "but otherwise I don't have any pain." He ambulates successfully with a cane and he is no longer receiving physical therapy. He notes that he was able to walk 1000 feet at the end of his PT, however the pt's daughter notes some concern that the pt hasn't been progressing physically since his PT ended. The pt notes that he is continuing to work with his nurse  aide to be more active.   The pt notes that he is eating about 65% of the amount of food he used to eat. He denies skipping meals, and consumes protein shakes as a meal supplement not as a replacement. He notes that he has had gradually more fatigue, and always feels "chilly". The pt denies feeling SOB, which he notes that he typically feels as a symptom of his worsened anemia.   Lab results today (04/18/18) of CBC w/diff and CMP is as follows: all values are WNL except for RBC at 2.84, HGB at 8.8, HCT at 28.6, MCV at 100.7, RDW at 16.5, Glucose at 102, BUN at 43,  Creatinine at 2.14, Calcium at 8.8, Albumin at 3.4, Alk Phos at 146, GFR at 32. 04/18/18 Testosterone is pending 04/18/18 PSA is pending  On review of systems, pt reports some fatigue, weaker appetite, eating moderately well, and denies SOB, abdominal pains, back pain, light headedness, dizziness, and any other symptoms.    MEDICAL HISTORY:  Past Medical History:  Diagnosis Date  . BPH associated with nocturia    flomax 0.4--> 0.8 mg trial. nocturia if has coffee. some incontinence  . CAD (coronary artery disease)    LAD Stent 2012. 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   . Gout    no rx. apparently 1x in past  . History of stroke    no aspirin before stroke. no deficits. slurred words at time of stroke  . Hyperlipidemia   . Hypertension     SURGICAL HISTORY: Past Surgical History:  Procedure Laterality Date  . CARDIAC SURGERY     Stint  . CORONARY STENT PLACEMENT    . left arm fracture s/p surgery    . right leg fracture s.p surgery- screws like arm      SOCIAL HISTORY: Social History   Socioeconomic History  . Marital status: Married    Spouse name: Not on file  . Number of children: Not on file  . Years of education: Not on file  . Highest education level: Not on file  Occupational History  . Not on file  Social Needs  . Financial resource strain: Not on file  . Food insecurity:    Worry: Not on file    Inability: Not on file  . Transportation needs:    Medical: Not on file    Non-medical: Not on file  Tobacco Use  . Smoking status: Former Smoker    Packs/day: 0.50    Years: 1.00    Pack years: 0.50    Types: Cigarettes    Last attempt to quit: 05/01/1952    Years since quitting: 66.0  . Smokeless tobacco: Never Used  Substance and Sexual Activity  . Alcohol use: No  . Drug use: No  . Sexual activity: Not on file  Lifestyle  . Physical activity:    Days per week: Not on file    Minutes per session: Not on file  . Stress: Not  on file  Relationships  . Social connections:    Talks on phone: Not on file    Gets together: Not on file    Attends religious service: Not on file    Active member of club or organization: Not on file    Attends meetings of clubs or organizations: Not on file    Relationship status: Not on file  . Intimate partner violence:    Fear of current or ex partner: Not on file  Emotionally abused: Not on file    Physically abused: Not on file    Forced sexual activity: Not on file  Other Topics Concern  . Not on file  Social History Narrative   Married- lives separate from wife. 2 children. Daughter passed from heart attack.       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 Breast cancer Mother   . Heart disease Father   . Heart disease Brother        x2  . Prostate cancer Brother     ALLERGIES:  has No Known Allergies.  MEDICATIONS:  Current Outpatient Medications  Medication Sig Dispense Refill  . aspirin EC 81 MG tablet Take 1 tablet (81 mg total) by mouth daily. 90 tablet 3  . BIDIL 20-37.5 MG tablet TAKE 1 TABLET BY MOUTH TWICE DAILY 180 tablet 0  . calcium-vitamin D (OSCAL 500/200 D-3) 500-200 MG-UNIT tablet Take 1 tablet by mouth daily with breakfast. 30 tablet 2  . fentaNYL (DURAGESIC - DOSED MCG/HR) 25 MCG/HR patch Place 1 patch (25 mcg total) onto the skin every 3 (three) days. 10 patch 0  . isosorbide-hydrALAZINE (BIDIL) 20-37.5 MG tablet Take 1 tablet by mouth 2 (two) times daily.    . metoprolol succinate (TOPROL-XL) 25 MG 24 hr tablet TAKE 1 TABLET BY MOUTH DAILY 90 tablet 0  . Multiple Vitamins-Minerals (MULTI COMPLETE PO) Take by mouth.    . nitroGLYCERIN (NITROSTAT) 0.4 MG SL tablet Place 1 tablet (0.4 mg total) under the tongue every 5 (five) minutes as needed for chest pain (3 maximum before seeking care). 30 tablet 0  . Omega-3 1000 MG CAPS Take 1 capsule by  mouth daily.     Marland Kitchen oxyCODONE (OXY IR/ROXICODONE) 5 MG immediate release tablet Take 1 tablet (5 mg total) by mouth every 4 (four) hours as needed for severe pain. 90 tablet 0  . polyethylene glycol (MIRALAX) packet Take 17 g by mouth daily. 30 each 1  . senna-docusate (SENNA S) 8.6-50 MG tablet Take 2 tablets by mouth 2 (two) times daily. 120 tablet 2  . tamsulosin (FLOMAX) 0.4 MG CAPS capsule Take 0.4 mg by mouth daily.    . vitamin B-12 (CYANOCOBALAMIN) 1000 MCG tablet Take 1 tablet (1,000 mcg total) by mouth daily. 30 tablet 0  . Vitamin D, Ergocalciferol, (DRISDOL) 50000 units CAPS capsule TAKE ONE CAPSULE BY MOUTH TWICE A WEEK 24 capsule 0   No current facility-administered medications for this visit.    Facility-Administered Medications Ordered in Other Visits  Medication Dose Route Frequency Provider Last Rate Last Dose  . denosumab (XGEVA) injection 120 mg  120 mg Subcutaneous Once Brunetta Genera, MD        REVIEW OF SYSTEMS:    A 10+ POINT REVIEW OF SYSTEMS WAS OBTAINED including neurology, dermatology, psychiatry, cardiac, respiratory, lymph, extremities, GI, GU, Musculoskeletal, constitutional, breasts, reproductive, HEENT.  All pertinent positives are noted in the HPI.  All others are negative.   PHYSICAL EXAMINATION:  Vitals:   04/18/18 1208  BP: 137/64  Pulse: 77  Resp: 17  Temp: 98.1 F (36.7 C)  SpO2: 98%   Filed Weights   04/18/18 1208  Weight: 178 lb 6.4 oz (80.9 kg)   .Body mass index is 27.94 kg/m.  GENERAL:alert, in no acute distress and comfortable SKIN: no acute rashes, no significant lesions EYES: conjunctiva are  pink and non-injected, sclera anicteric OROPHARYNX: MMM, no exudates, no oropharyngeal erythema or ulceration NECK: supple, no JVD LYMPH:  no palpable lymphadenopathy in the cervical, axillary or inguinal regions LUNGS: clear to auscultation b/l with normal respiratory effort HEART: regular rate & rhythm ABDOMEN:  normoactive bowel  sounds , non tender, not distended. No palpable hepatosplenomegaly.  Extremity: no pedal edema PSYCH: alert & oriented x 3 with fluent speech NEURO: no focal motor/sensory deficits   LABORATORY DATA:  I have reviewed the data as listed  . CBC Latest Ref Rng & Units 04/18/2018 03/21/2018 02/20/2018  WBC 4.0 - 10.5 K/uL 5.7 5.6 4.9  Hemoglobin 13.0 - 17.0 g/dL 8.8(L) 9.7(L) 10.5(L)  Hematocrit 39.0 - 52.0 % 28.6(L) 31.9(L) 34.7(L)  Platelets 150 - 400 K/uL 182 224 205   . CBC    Component Value Date/Time   WBC 5.7 04/18/2018 1117   RBC 2.84 (L) 04/18/2018 1117   HGB 8.8 (L) 04/18/2018 1117   HGB 8.1 (L) 02/01/2018 0804   HCT 28.6 (L) 04/18/2018 1117   HCT 21.9 (L) 07/20/2017 2103   PLT 182 04/18/2018 1117   PLT 194 02/01/2018 0804   MCV 100.7 (H) 04/18/2018 1117   MCH 31.0 04/18/2018 1117   MCHC 30.8 04/18/2018 1117   RDW 16.5 (H) 04/18/2018 1117   LYMPHSABS 1.3 04/18/2018 1117   MONOABS 0.7 04/18/2018 1117   EOSABS 0.0 04/18/2018 1117   BASOSABS 0.0 04/18/2018 1117     . CMP Latest Ref Rng & Units 04/18/2018 03/21/2018 02/20/2018  Glucose 70 - 99 mg/dL 102(H) 101(H) 98  BUN 8 - 23 mg/dL 43(H) 21 19  Creatinine 0.61 - 1.24 mg/dL 2.14(H) 1.80(H) 1.72(H)  Sodium 135 - 145 mmol/L 137 139 140  Potassium 3.5 - 5.1 mmol/L 4.6 5.0 4.6  Chloride 98 - 111 mmol/L 106 107 107  CO2 22 - 32 mmol/L '23 23 24  '$ Calcium 8.9 - 10.3 mg/dL 8.8(L) 8.8(L) 8.7(L)  Total Protein 6.5 - 8.1 g/dL 7.1 7.3 7.3  Total Bilirubin 0.3 - 1.2 mg/dL 0.7 0.7 1.0  Alkaline Phos 38 - 126 U/L 146(H) 140(H) 129(H)  AST 15 - 41 U/L '16 19 15  '$ ALT 0 - 44 U/L '9 11 9    '$ Component     Latest Ref Rng & Units 07/30/2017 09/06/2017 10/04/2017 10/31/2017  Prostate Specific Ag, Serum     0.0 - 4.0 ng/mL 1,186.0 (H) 433.8 (H) 67.4 (H) 55.8 (H)   Component     Latest Ref Rng & Units 11/29/2017  Prostate Specific Ag, Serum     0.0 - 4.0 ng/mL 51.1 (H)   07/31/17 BM Bx:   RADIOGRAPHIC STUDIES: I have personally  reviewed the radiological images as listed and agreed with the findings in the report. No results found.  ASSESSMENT & PLAN:   84 y.o. male with  1. Normocytic Anemia - due to primarily metastatic prostatic cancer (ACD + BM involvement with prostate cancer causing myelopthisic picture)  -Pt presented with a Hgb at 8.7 on 07/22/17, improved to 10.3 after receiving PRBC transfusion. -In review of the patient's previous CBC records, his anemia developed 6-7 months before presenting to care with me, unaccompanied by a drop in his EPO (07/20/17 elevated at 249.8);  LDH elevated  but haptoglobin at 357 on 07/20/17. -suggested against active hemolysis.  2. Recently diagnosed metastatic prostate cancer with extensive bone metastases. With bone mets/BM Mets and LNadenopathy. Exam positive for left retroperitoneal, bilateral pelvis and right inguinal adenopathy. Exam  positive for left retroperitoneal, bilateral pelvis and right inguinal adenopathy. Diffuse sclerotic bone metastasis.   3. Elevated alkaline phosphatase due to bone metastases from prostate cancer -CT BM bx -concerning for significant bone lesions in lower spine and pelvis PSA levels have declined from1186, down to 433, down to 67.4, and now 55 to 51 and stable today at 53.3  4. Cancer related pain - primarily in b/l thighs. -Xray b/l femurs today showed no overt measure high risk for fracture lesions -much improved.  PLAN:  -Discussed pt labwork today, 04/18/18; HGB decreased to 8.8, other blood counts are stable. Creatinine at 2.14 and BUN at 43.  -04/18/18 PSA is pending, last available PSA from 02/20/18 was at 53.3  -Offered pt a blood transfusion, and he notes that he will let me know if his symptoms necessitate this  -Discussed with the pt that since the patient's PSA appears to have reached a plateau, and as his Hemoglobin is beginning to trend down again, I would recommend adding on Xtandi  -Recommended that the pt continue to  eat well, drink at least 48-64 oz of water each day, and walk 15-20 minutes each day. -Continue Lupron injections every 12 weeks -Continue Xgeva every 4 weeks  -Continue on '81mg'$  Aspirin and Flomax  -Regarding the patient's anemia, I havel set up transfusions prn for hgb<8  -Vitamin D and Calcium supplements continue. -Improved vit D levels with replacement (up from 14.1 to 49.8)  -Nutritional therapy referral to [facility] elevating feet for mild ankle swelling -Will see the pt back in 1 month  5. High risk for decubitus ulcers and having difficult with thigh pains that limit mobility. -hospital bed -limited ambulation    Continue Marchelle Folks with labs monthly Continue Lupron q12weeks  RTC with Dr [name] in 1 months with labs    All of the patients questions were answered with apparent satisfaction. The patient knows to call the clinic with any problems, questions or concerns.  The total time spent in the appt was 30 minutes and more than 50% was on counseling and direct patient cares.    Sullivan Lone MD MS AAHIVMS [facility] Hematology/Oncology Physician [facility]  (Office):       [phone] (Work cell):  [phone] (Fax):           [phone]  04/18/2018 1:12 PM  I, Baldwin Jamaica, am acting as a scribe for Dr. Sullivan Lone.   .I have reviewed the above documentation for accuracy and completeness, and I agree with the above. Brunetta Genera MD

## 2018-04-18 ENCOUNTER — Inpatient Hospital Stay: Payer: Medicare Other

## 2018-04-18 ENCOUNTER — Ambulatory Visit: Payer: Medicare Other

## 2018-04-18 ENCOUNTER — Encounter: Payer: Self-pay | Admitting: Hematology

## 2018-04-18 ENCOUNTER — Inpatient Hospital Stay: Payer: Medicare Other | Attending: Hematology

## 2018-04-18 ENCOUNTER — Other Ambulatory Visit: Payer: Medicare Other

## 2018-04-18 ENCOUNTER — Inpatient Hospital Stay (HOSPITAL_BASED_OUTPATIENT_CLINIC_OR_DEPARTMENT_OTHER): Payer: Medicare Other | Admitting: Hematology

## 2018-04-18 VITALS — BP 137/64 | HR 77 | Temp 98.1°F | Resp 17 | Ht 67.0 in | Wt 178.4 lb

## 2018-04-18 DIAGNOSIS — Z79899 Other long term (current) drug therapy: Secondary | ICD-10-CM | POA: Insufficient documentation

## 2018-04-18 DIAGNOSIS — C7951 Secondary malignant neoplasm of bone: Secondary | ICD-10-CM

## 2018-04-18 DIAGNOSIS — Z79818 Long term (current) use of other agents affecting estrogen receptors and estrogen levels: Secondary | ICD-10-CM | POA: Insufficient documentation

## 2018-04-18 DIAGNOSIS — C61 Malignant neoplasm of prostate: Secondary | ICD-10-CM | POA: Diagnosis present

## 2018-04-18 DIAGNOSIS — G893 Neoplasm related pain (acute) (chronic): Secondary | ICD-10-CM | POA: Insufficient documentation

## 2018-04-18 LAB — CMP (CANCER CENTER ONLY)
ALT: 9 U/L (ref 0–44)
AST: 16 U/L (ref 15–41)
Albumin: 3.4 g/dL — ABNORMAL LOW (ref 3.5–5.0)
Alkaline Phosphatase: 146 U/L — ABNORMAL HIGH (ref 38–126)
Anion gap: 8 (ref 5–15)
BUN: 43 mg/dL — ABNORMAL HIGH (ref 8–23)
CO2: 23 mmol/L (ref 22–32)
Calcium: 8.8 mg/dL — ABNORMAL LOW (ref 8.9–10.3)
Chloride: 106 mmol/L (ref 98–111)
Creatinine: 2.14 mg/dL — ABNORMAL HIGH (ref 0.61–1.24)
GFR, EST AFRICAN AMERICAN: 32 mL/min — AB (ref >60)
GFR, Estimated: 27 mL/min — ABNORMAL LOW (ref >60)
Glucose, Bld: 102 mg/dL — ABNORMAL HIGH (ref 70–99)
Potassium: 4.6 mmol/L (ref 3.5–5.1)
Sodium: 137 mmol/L (ref 135–145)
Total Bilirubin: 0.7 mg/dL (ref 0.3–1.2)
Total Protein: 7.1 g/dL (ref 6.5–8.1)

## 2018-04-18 LAB — CBC WITH DIFFERENTIAL/PLATELET
Abs Immature Granulocytes: 0.04 10*3/uL (ref 0.00–0.07)
BASOS PCT: 0 %
Basophils Absolute: 0 10*3/uL (ref 0.0–0.1)
Eosinophils Absolute: 0 10*3/uL (ref 0.0–0.5)
Eosinophils Relative: 1 %
HCT: 28.6 % — ABNORMAL LOW (ref 39.0–52.0)
Hemoglobin: 8.8 g/dL — ABNORMAL LOW (ref 13.0–17.0)
Immature Granulocytes: 1 %
Lymphocytes Relative: 23 %
Lymphs Abs: 1.3 10*3/uL (ref 0.7–4.0)
MCH: 31 pg (ref 26.0–34.0)
MCHC: 30.8 g/dL (ref 30.0–36.0)
MCV: 100.7 fL — ABNORMAL HIGH (ref 80.0–100.0)
Monocytes Absolute: 0.7 10*3/uL (ref 0.1–1.0)
Monocytes Relative: 12 %
NRBC: 0 % (ref 0.0–0.2)
Neutro Abs: 3.6 10*3/uL (ref 1.7–7.7)
Neutrophils Relative %: 63 %
PLATELETS: 182 10*3/uL (ref 150–400)
RBC: 2.84 MIL/uL — ABNORMAL LOW (ref 4.22–5.81)
RDW: 16.5 % — ABNORMAL HIGH (ref 11.5–15.5)
WBC: 5.7 10*3/uL (ref 4.0–10.5)

## 2018-04-18 LAB — SAMPLE TO BLOOD BANK

## 2018-04-18 MED ORDER — DENOSUMAB 120 MG/1.7ML ~~LOC~~ SOLN
120.0000 mg | Freq: Once | SUBCUTANEOUS | Status: AC
  Administered 2018-04-18: 120 mg via SUBCUTANEOUS

## 2018-04-18 MED ORDER — DENOSUMAB 120 MG/1.7ML ~~LOC~~ SOLN
SUBCUTANEOUS | Status: AC
  Filled 2018-04-18: qty 1.7

## 2018-04-18 NOTE — Patient Instructions (Signed)
Thank you for choosing [facility] to provide your oncology and hematology care.  To afford each patient quality time with our providers, please arrive 30 minutes before your scheduled appointment time.  If you arrive late for your appointment, you may be asked to reschedule.  We strive to give you quality time with our providers, and arriving late affects you and other patients whose appointments are after yours.    If you are a no show for multiple scheduled visits, you may be dismissed from the clinic at the providers discretion.     Again, thank you for choosing [facility], our hope is that these requests will decrease the amount of time that you wait before being seen by our physicians.  ______________________________________________________________________   Should you have questions after your visit to the [facility], please contact our office at [phone] between the hours of 8:30 and 4:30 p.m.    Voicemails left after 4:30p.m will not be returned until the following business day.     For prescription refill requests, please have your pharmacy contact us directly.  Please also try to allow 48 hours for prescription requests.     Please contact the scheduling department for questions regarding scheduling.  For scheduling of procedures such as PET scans, CT scans, MRI, Ultrasound, etc please contact central scheduling at [phone].     Resources For Cancer Patients and Caregivers:    Oncolink.org:  A wonderful resource for patients and healthcare providers for information regarding your disease, ways to tract your treatment, what to expect, etc.      American Cancer Society:  800-227-2345  Can help patients locate various types of support and financial assistance   Cancer Care: 1-800-813-HOPE (4673) Provides financial assistance, online support groups, medication/co-pay assistance.     [place] DSS:  336-641-3447 Where to apply  for food stamps, Medicaid, and utility assistance   Medicare Rights Center: 800-333-4114 Helps people with Medicare understand their rights and benefits, navigate the Medicare system, and secure the quality healthcare they deserve   SCAT: 336-333-6589 [place] [employer]'s shared-ride transportation service for eligible riders who have a disability that prevents them from riding the fixed route bus.     For additional information on assistance programs please contact our social worker:   Abigail Elmore:  [phone]  

## 2018-04-19 LAB — PROSTATE-SPECIFIC AG, SERUM (LABCORP): Prostate Specific Ag, Serum: 91.2 ng/mL — ABNORMAL HIGH (ref 0.0–4.0)

## 2018-04-19 LAB — TESTOSTERONE: Testosterone: 4 ng/dL — ABNORMAL LOW (ref 264–916)

## 2018-04-22 DIAGNOSIS — R0600 Dyspnea, unspecified: Secondary | ICD-10-CM | POA: Diagnosis not present

## 2018-04-23 ENCOUNTER — Telehealth: Payer: Self-pay

## 2018-04-23 NOTE — Telephone Encounter (Signed)
Spoke with patient daughter concerning some newly added appointments. Per 12/19 los. Mailed a letter with a calender enclosed.

## 2018-05-03 ENCOUNTER — Other Ambulatory Visit: Payer: Self-pay | Admitting: Hematology

## 2018-05-03 ENCOUNTER — Other Ambulatory Visit: Payer: Self-pay | Admitting: *Deleted

## 2018-05-03 NOTE — Telephone Encounter (Signed)
Daughter requested refill of Fentanyl sent to [facility]

## 2018-05-04 MED ORDER — FENTANYL 25 MCG/HR TD PT72: 25.0000 ug | MEDICATED_PATCH | TRANSDERMAL | 0 refills | Status: DC

## 2018-05-15 NOTE — Progress Notes (Signed)
HEMATOLOGY/ONCOLOGY CLINIC NOTE  Date of Service: 05/16/18    Patient Care Team: [name], MD as PCP - General (Family Medicine)  CHIEF COMPLAINTS/PURPOSE OF CONSULTATION:  -recently diagnosed metastatic prostate cancer -Myelopthisic anemia  HISTORY OF PRESENTING ILLNESS:   Tony [name] is a wonderful 85 y.o. male who has been referred to [place] by Dr Dimas Chyle for evaluation and management of anemia. He is accompanied today by his daughter. The pt reports that he is doing well overall.   The pt reports a new onset of fatigue that began in January 2019. His daughter notes being able to tell a difference in his energy levels as early as November 2018. He notes that some cramping pain in his thighs. He notes that his recent 07/23/17 blood transfusion has led to a "terrific, immediate change" in his thigh pain. However, his thigh pain has returned in the last couple days.  He notes that he takes 1056mg Vitamin B12 daily.   He notes that for 6-7 years he took iron pills after his 2012 heart attack and stroke. He notes that he took a statin for 6-7 months and was taken off of it recently. He notes no unresolved symptoms from his stroke.   The pt notes that over the last 4-5 months his appetite has diminished and he has subsequently lost about 20 lbs in that time.  He notes that he has historically not preferred to drink water as such, but hydrates with juice, coffee, and other drinks.  He notes that he believes that he is able to take care of himself adequately at home. He also notes that his cane is sufficient for helping him get around.   Most recent lab results (07/22/17) of CBC  is as follows: all values are WNL except for RBC at 2.94, Hgb at 8.7, HCT at 27.2, RDW at 26.5, Platelets at 134k. CBC from 10/30/16 revealed all values WNL.  CMP 07/21/17 revealed all values WNL except for Sodium at 134, Glucose at 101, Creatinine at 1.85, Calcium at 7.9, Total Protein at 5.7, Albumin at  2.7, Alk Phos at 433.  LDH 07/20/17 elevated at 256. Haptoglobin 07/20/17 elevated at 357.  Vitamin B12 07/06/17 is WNL at 229.   On review of systems, pt reports decreased appetite, losing 20 lbs over 4-5 months, bilateral thigh pain, fatigue, mild leg swelling, and denies fevers, chills, night sweats, bone pains, nausea, abdominal pains, bleeding, blood in the urine, blood in the stools, black stools, changes in bowel habits, light headednss, dizziness, abdominal pains, noticing any new lumps or bumps, testicular pain or swelling, and any other symptoms.   On PMHx the pt reports heart attack and stroke in 2012. On Social Hx the pt denies much ETOH consumption.   Interval History:  Mr. FBurk Hoctorreturns today regarding his metastatic prostate cancer and myelopthisic anemia. The patient's last visit with [place] on 04/18/18. He is accompanied today by his daughter. The pt reports that he is doing well overall.   The pt reports that he is having some pain in the knees but denies bone pain in his thighs at this time. He notes that he is no longer getting PT but has intentional about moving around and exercising. He is ambulating successfully at home with a walker and a cane. The pt notes that his appetite is "a little better," and is consuming one protein shake a day in addition to two meals. He denies any difficulty swallowing. The  pt notes that he has been feeling more tired but denies any light headedness or dizziness at this time.   Lab results today (05/16/18) of CBC w/diff and CMP is as follows: all values are WNL except for RBC at 2.63, HGB at 7.9, HCT at 26.3, RDW at 16.4, nRBC at 0.5%, Sodium at 134, Potassium at 5.3, Glucose at 105, BUN at 24, Creatinine at 2.26, Calcium at 8.3, Albumin at 3.3, Alk Phos at 183, GFR at 29.  On review of systems, pt reports eating marginally better, pain in his knees, low energy levels, moving his bowels well, improved leg swelling, and denies bone pains,  difficulty swallowing, light headedness dizziness, abdominal pains, constipation, and any other symptoms.  MEDICAL HISTORY:  Past Medical History:  Diagnosis Date  . BPH associated with nocturia    flomax 0.4--> 0.8 mg trial. nocturia if has coffee. some incontinence  . CAD (coronary artery disease)    LAD Stent 2012. 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   . Gout    no rx. apparently 1x in past  . History of stroke    no aspirin before stroke. no deficits. slurred words at time of stroke  . Hyperlipidemia   . Hypertension     SURGICAL HISTORY: Past Surgical History:  Procedure Laterality Date  . CARDIAC SURGERY     Stint  . CORONARY STENT PLACEMENT    . left arm fracture s/p surgery    . right leg fracture s.p surgery- screws like arm      SOCIAL HISTORY: Social History   Socioeconomic History  . Marital status: Married    Spouse name: Not on file  . Number of children: Not on file  . Years of education: Not on file  . Highest education level: Not on file  Occupational History  . Not on file  Social Needs  . Financial resource strain: Not on file  . Food insecurity:    Worry: Not on file    Inability: Not on file  . Transportation needs:    Medical: Not on file    Non-medical: Not on file  Tobacco Use  . Smoking status: Former Smoker    Packs/day: 0.50    Years: 1.00    Pack years: 0.50    Types: Cigarettes    Last attempt to quit: 05/01/1952    Years since quitting: 66.0  . Smokeless tobacco: Never Used  Substance and Sexual Activity  . Alcohol use: No  . Drug use: No  . Sexual activity: Not on file  Lifestyle  . Physical activity:    Days per week: Not on file    Minutes per session: Not on file  . Stress: Not on file  Relationships  . Social connections:    Talks on phone: Not on file    Gets together: Not on file    Attends religious service: Not on file    Active member of club or organization: Not on file    Attends  meetings of clubs or organizations: Not on file    Relationship status: Not on file  . Intimate partner violence:    Fear of current or ex partner: Not on file    Emotionally abused: Not on file    Physically abused: Not on file    Forced sexual activity: Not on file  Other Topics Concern  . Not on file  Social History Narrative   Married- lives separate from wife. 2  children. Daughter passed from heart attack.       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 Breast cancer Mother   . Heart disease Father   . Heart disease Brother        x2  . Prostate cancer Brother     ALLERGIES:  has No Known Allergies.  MEDICATIONS:  Current Outpatient Medications  Medication Sig Dispense Refill  . aspirin EC 81 MG tablet Take 1 tablet (81 mg total) by mouth daily. 90 tablet 3  . BIDIL 20-37.5 MG tablet TAKE 1 TABLET BY MOUTH TWICE DAILY 180 tablet 0  . calcium-vitamin D (OSCAL 500/200 D-3) 500-200 MG-UNIT tablet Take 1 tablet by mouth daily with breakfast. 30 tablet 2  . enzalutamide (XTANDI) 40 MG capsule Take 4 capsules (160 mg total) by mouth daily. 120 capsule 1  . fentaNYL (DURAGESIC - DOSED MCG/HR) 25 MCG/HR patch Place 1 patch (25 mcg total) onto the skin every 3 (three) days. 10 patch 0  . isosorbide-hydrALAZINE (BIDIL) 20-37.5 MG tablet Take 1 tablet by mouth 2 (two) times daily.    . metoprolol succinate (TOPROL-XL) 25 MG 24 hr tablet TAKE 1 TABLET BY MOUTH DAILY 90 tablet 0  . Multiple Vitamins-Minerals (MULTI COMPLETE PO) Take by mouth.    . nitroGLYCERIN (NITROSTAT) 0.4 MG SL tablet Place 1 tablet (0.4 mg total) under the tongue every 5 (five) minutes as needed for chest pain (3 maximum before seeking care). 30 tablet 0  . Omega-3 1000 MG CAPS Take 1 capsule by mouth daily.     Marland Kitchen oxyCODONE (OXY IR/ROXICODONE) 5 MG immediate release tablet Take 1 tablet (5 mg total) by mouth  every 4 (four) hours as needed for severe pain. 90 tablet 0  . polyethylene glycol (MIRALAX) packet Take 17 g by mouth daily. 30 each 1  . senna-docusate (SENNA S) 8.6-50 MG tablet Take 2 tablets by mouth 2 (two) times daily. 120 tablet 2  . tamsulosin (FLOMAX) 0.4 MG CAPS capsule Take 0.4 mg by mouth daily.    . vitamin B-12 (CYANOCOBALAMIN) 1000 MCG tablet Take 1 tablet (1,000 mcg total) by mouth daily. 30 tablet 0  . Vitamin D, Ergocalciferol, (DRISDOL) 1.25 MG (50000 UT) CAPS capsule TAKE 1 CAPSULE BY MOUTH 2 TIMES A WEEK 24 capsule 0   No current facility-administered medications for this visit.     REVIEW OF SYSTEMS:    A 10+ POINT REVIEW OF SYSTEMS WAS OBTAINED including neurology, dermatology, psychiatry, cardiac, respiratory, lymph, extremities, GI, GU, Musculoskeletal, constitutional, breasts, reproductive, HEENT.  All pertinent positives are noted in the HPI.  All others are negative.   PHYSICAL EXAMINATION:  Vitals:   05/16/18 1447  BP: (!) 148/79  Pulse: 81  Resp: 18  Temp: 98.4 F (36.9 C)  SpO2: 98%   Filed Weights   05/16/18 1447  Weight: 177 lb 9.6 oz (80.6 kg)   .Body mass index is 27.82 kg/m.  GENERAL:alert, in no acute distress and comfortable SKIN: no acute rashes, no significant lesions EYES: conjunctiva are pink and non-injected, sclera anicteric OROPHARYNX: MMM, no exudates, no oropharyngeal erythema or ulceration NECK: supple, no JVD LYMPH:  no palpable lymphadenopathy in the cervical, axillary or inguinal regions LUNGS: clear to auscultation b/l with normal respiratory effort HEART: regular rate & rhythm ABDOMEN:  normoactive bowel sounds , non tender, not distended. No palpable hepatosplenomegaly.  Extremity: 1+ pedal edema PSYCH: alert & oriented x 3 with fluent speech NEURO: no focal motor/sensory deficits   LABORATORY DATA:  I have reviewed the data as listed  . CBC Latest Ref Rng & Units 05/16/2018 04/18/2018 03/21/2018  WBC 4.0 - 10.5  K/uL 6.4 5.7 5.6  Hemoglobin 13.0 - 17.0 g/dL 7.9(L) 8.8(L) 9.7(L)  Hematocrit 39.0 - 52.0 % 26.3(L) 28.6(L) 31.9(L)  Platelets 150 - 400 K/uL 204 182 224   . CBC    Component Value Date/Time   WBC 6.4 05/16/2018 1354   RBC 2.63 (L) 05/16/2018 1354   HGB 7.9 (L) 05/16/2018 1354   HGB 8.1 (L) 02/01/2018 0804   HCT 26.3 (L) 05/16/2018 1354   HCT 21.9 (L) 07/20/2017 2103   PLT 204 05/16/2018 1354   PLT 194 02/01/2018 0804   MCV 100.0 05/16/2018 1354   MCH 30.0 05/16/2018 1354   MCHC 30.0 05/16/2018 1354   RDW 16.4 (H) 05/16/2018 1354   LYMPHSABS 1.6 05/16/2018 1354   MONOABS 0.7 05/16/2018 1354   EOSABS 0.1 05/16/2018 1354   BASOSABS 0.0 05/16/2018 1354     . CMP Latest Ref Rng & Units 05/16/2018 04/18/2018 03/21/2018  Glucose 70 - 99 mg/dL 105(H) 102(H) 101(H)  BUN 8 - 23 mg/dL 24(H) 43(H) 21  Creatinine 0.61 - 1.24 mg/dL 2.26(H) 2.14(H) 1.80(H)  Sodium 135 - 145 mmol/L 134(L) 137 139  Potassium 3.5 - 5.1 mmol/L 5.3(H) 4.6 5.0  Chloride 98 - 111 mmol/L 105 106 107  CO2 22 - 32 mmol/L '23 23 23  '$ Calcium 8.9 - 10.3 mg/dL 8.3(L) 8.8(L) 8.8(L)  Total Protein 6.5 - 8.1 g/dL 7.1 7.1 7.3  Total Bilirubin 0.3 - 1.2 mg/dL 0.7 0.7 0.7  Alkaline Phos 38 - 126 U/L 183(H) 146(H) 140(H)  AST 15 - 41 U/L '15 16 19  '$ ALT 0 - 44 U/L '8 9 11    '$ Component     Latest Ref Rng & Units 07/30/2017 09/06/2017 10/04/2017 10/31/2017  Prostate Specific Ag, Serum     0.0 - 4.0 ng/mL 1,186.0 (H) 433.8 (H) 67.4 (H) 55.8 (H)   Component     Latest Ref Rng & Units 11/29/2017  Prostate Specific Ag, Serum     0.0 - 4.0 ng/mL 51.1 (H)   07/31/17 BM Bx:   RADIOGRAPHIC STUDIES: I have personally reviewed the radiological images as listed and agreed with the findings in the report. No results found.  ASSESSMENT & PLAN:   85 y.o. male with  1. Normocytic Anemia - due to primarily metastatic prostatic cancer (ACD + BM involvement with prostate cancer causing myelopthisic picture)  -Pt presented with a Hgb at  8.7 on 07/22/17, improved to 10.3 after receiving PRBC transfusion. -In review of the patient's previous CBC records, his anemia developed 6-7 months before presenting to care with me, unaccompanied by a drop in his EPO (07/20/17 elevated at 249.8);  LDH elevated  but haptoglobin at 357 on 07/20/17. -suggested against active hemolysis.  2. Recently diagnosed metastatic prostate cancer with extensive bone metastases. With bone mets/BM Mets and LNadenopathy. Exam positive for left retroperitoneal, bilateral pelvis and right inguinal adenopathy. Exam positive for left retroperitoneal, bilateral pelvis and right inguinal adenopathy. Diffuse sclerotic bone metastasis.   3. Elevated alkaline phosphatase due to bone metastases from prostate cancer -CT BM bx -concerning for significant bone lesions in lower spine and pelvis PSA levels have declined from1186, down to 433, down to 67.4, then 55 to 51, then 53.3, then 91.2  4. Cancer related pain - primarily in b/l thighs. -Xray b/l femurs today showed no overt measure high risk for fracture lesions -much improved.  PLAN:  -Discussed pt labwork today, 05/16/18; HGB decreased to 7.9, WBC normal at 6.4k, PLT stable. Creatinine at 2.26. -Offered 2 units PRBCs transfusion which the pt prefers -Offered port placement as the pt's veins have been difficulty to find, which the pt prefers -05/16/18 PSA is pending. Last available PSA from 04/18/18 was at 91.2 and testosterone was appropriately suppressed at 4 -Discussed that as the patient's PSA has begun increasing and is showing signs of castrate resistance, I would recommend adding on Enzalutamide and provided supplemental information  -Pt will consider marinol in the interim, which I advised as an option to treat his anorexia -Will refer the pt to IR for port placement  - patient wants to think about this. -Continue Lupron injections every 12 weeks -Continue Xgeva every 4 weeks  -Continue on '81mg'$  Aspirin and  Flomax  -Regarding the patient's anemia, I havel set up transfusions prn for hgb<8  -Vitamin D and Calcium supplements continue. -Improved vit D levels with replacement (up from 14.1 to 49.8)  -Nutritional therapy referral to [facility] -Continue elevating feet for mild ankle swelling -Recommended that the pt continue to eat well, drink at least 48-64 oz of water each day, and walk 20-30 minutes each day. -Will see the pt back in 4 weeks  5. High risk for decubitus ulcers and having difficult with thigh pains that limit mobility. -hospital bed -limited ambulation    Please schedule 2 units of PRBC transfusion in next 1-2 days Continue Marchelle Folks with labs monthly Continue Lupron q12weeks  RTC with Dr [name] in 1 months with labs    All of the patients questions were answered with apparent satisfaction. The patient knows to call the clinic with any problems, questions or concerns.  The total time spent in the appt was 30 minutes and more than 50% was on counseling and direct patient cares.    Sullivan Lone MD MS AAHIVMS [facility] Hematology/Oncology Physician [facility]  (Office):       [phone] (Work cell):  [phone] (Fax):           [phone]  05/16/2018 3:34 PM  I, Baldwin [place], am acting as a scribe for Dr. Sullivan Lone.   .I have reviewed the above documentation for accuracy and completeness, and I agree with the above. Brunetta Genera MD

## 2018-05-16 ENCOUNTER — Other Ambulatory Visit: Payer: Medicare Other

## 2018-05-16 ENCOUNTER — Ambulatory Visit: Payer: Medicare Other

## 2018-05-16 ENCOUNTER — Inpatient Hospital Stay: Payer: Medicare Other

## 2018-05-16 ENCOUNTER — Inpatient Hospital Stay: Payer: Medicare Other | Attending: Hematology

## 2018-05-16 ENCOUNTER — Inpatient Hospital Stay (HOSPITAL_BASED_OUTPATIENT_CLINIC_OR_DEPARTMENT_OTHER): Payer: Medicare Other | Admitting: Hematology

## 2018-05-16 VITALS — BP 148/79 | HR 81 | Temp 98.4°F | Resp 18 | Ht 67.0 in | Wt 177.6 lb

## 2018-05-16 DIAGNOSIS — C61 Malignant neoplasm of prostate: Secondary | ICD-10-CM

## 2018-05-16 DIAGNOSIS — Z7982 Long term (current) use of aspirin: Secondary | ICD-10-CM

## 2018-05-16 DIAGNOSIS — M25473 Effusion, unspecified ankle: Secondary | ICD-10-CM

## 2018-05-16 DIAGNOSIS — R590 Localized enlarged lymph nodes: Secondary | ICD-10-CM | POA: Insufficient documentation

## 2018-05-16 DIAGNOSIS — G893 Neoplasm related pain (acute) (chronic): Secondary | ICD-10-CM | POA: Insufficient documentation

## 2018-05-16 DIAGNOSIS — D6489 Other specified anemias: Secondary | ICD-10-CM | POA: Insufficient documentation

## 2018-05-16 DIAGNOSIS — Z79899 Other long term (current) drug therapy: Secondary | ICD-10-CM | POA: Diagnosis not present

## 2018-05-16 DIAGNOSIS — C7951 Secondary malignant neoplasm of bone: Secondary | ICD-10-CM | POA: Insufficient documentation

## 2018-05-16 DIAGNOSIS — M2550 Pain in unspecified joint: Secondary | ICD-10-CM | POA: Insufficient documentation

## 2018-05-16 DIAGNOSIS — F5089 Other specified eating disorder: Secondary | ICD-10-CM

## 2018-05-16 DIAGNOSIS — M791 Myalgia, unspecified site: Secondary | ICD-10-CM | POA: Insufficient documentation

## 2018-05-16 DIAGNOSIS — Z5111 Encounter for antineoplastic chemotherapy: Secondary | ICD-10-CM | POA: Insufficient documentation

## 2018-05-16 LAB — CBC WITH DIFFERENTIAL/PLATELET
Abs Immature Granulocytes: 0.06 10*3/uL (ref 0.00–0.07)
Basophils Absolute: 0 10*3/uL (ref 0.0–0.1)
Basophils Relative: 0 %
Eosinophils Absolute: 0.1 10*3/uL (ref 0.0–0.5)
Eosinophils Relative: 1 %
HCT: 26.3 % — ABNORMAL LOW (ref 39.0–52.0)
HEMOGLOBIN: 7.9 g/dL — AB (ref 13.0–17.0)
Immature Granulocytes: 1 %
Lymphocytes Relative: 25 %
Lymphs Abs: 1.6 10*3/uL (ref 0.7–4.0)
MCH: 30 pg (ref 26.0–34.0)
MCHC: 30 g/dL (ref 30.0–36.0)
MCV: 100 fL (ref 80.0–100.0)
Monocytes Absolute: 0.7 10*3/uL (ref 0.1–1.0)
Monocytes Relative: 11 %
Neutro Abs: 3.9 10*3/uL (ref 1.7–7.7)
Neutrophils Relative %: 62 %
Platelets: 204 10*3/uL (ref 150–400)
RBC: 2.63 MIL/uL — ABNORMAL LOW (ref 4.22–5.81)
RDW: 16.4 % — ABNORMAL HIGH (ref 11.5–15.5)
WBC: 6.4 10*3/uL (ref 4.0–10.5)
nRBC: 0.5 % — ABNORMAL HIGH (ref 0.0–0.2)

## 2018-05-16 LAB — CMP (CANCER CENTER ONLY)
ALT: 8 U/L (ref 0–44)
AST: 15 U/L (ref 15–41)
Albumin: 3.3 g/dL — ABNORMAL LOW (ref 3.5–5.0)
Alkaline Phosphatase: 183 U/L — ABNORMAL HIGH (ref 38–126)
Anion gap: 6 (ref 5–15)
BUN: 24 mg/dL — AB (ref 8–23)
CHLORIDE: 105 mmol/L (ref 98–111)
CO2: 23 mmol/L (ref 22–32)
Calcium: 8.3 mg/dL — ABNORMAL LOW (ref 8.9–10.3)
Creatinine: 2.26 mg/dL — ABNORMAL HIGH (ref 0.61–1.24)
GFR, Est AFR Am: 29 mL/min — ABNORMAL LOW (ref >60)
GFR, Estimated: 25 mL/min — ABNORMAL LOW (ref >60)
Glucose, Bld: 105 mg/dL — ABNORMAL HIGH (ref 70–99)
Potassium: 5.3 mmol/L — ABNORMAL HIGH (ref 3.5–5.1)
Sodium: 134 mmol/L — ABNORMAL LOW (ref 135–145)
Total Bilirubin: 0.7 mg/dL (ref 0.3–1.2)
Total Protein: 7.1 g/dL (ref 6.5–8.1)

## 2018-05-16 LAB — SAMPLE TO BLOOD BANK

## 2018-05-16 MED ORDER — DENOSUMAB 120 MG/1.7ML ~~LOC~~ SOLN
SUBCUTANEOUS | Status: AC
  Filled 2018-05-16: qty 1.7

## 2018-05-16 MED ORDER — DENOSUMAB 120 MG/1.7ML ~~LOC~~ SOLN
120.0000 mg | Freq: Once | SUBCUTANEOUS | Status: AC
  Administered 2018-05-16: 120 mg via SUBCUTANEOUS

## 2018-05-16 MED ORDER — LEUPROLIDE ACETATE (3 MONTH) 22.5 MG IM KIT
PACK | INTRAMUSCULAR | Status: AC
  Filled 2018-05-16: qty 22.5

## 2018-05-16 MED ORDER — LEUPROLIDE ACETATE (3 MONTH) 22.5 MG IM KIT
22.5000 mg | PACK | Freq: Once | INTRAMUSCULAR | Status: AC
  Administered 2018-05-16: 22.5 mg via INTRAMUSCULAR

## 2018-05-16 MED ORDER — ENZALUTAMIDE 40 MG PO CAPS: 160.0000 mg | ORAL_CAPSULE | Freq: Every day | ORAL | 1 refills | Status: DC

## 2018-05-16 NOTE — Progress Notes (Signed)
Per Dr. [name] OK to give [facility] with calcium of 8.3.

## 2018-05-17 ENCOUNTER — Telehealth: Payer: Self-pay | Admitting: Pharmacist

## 2018-05-17 ENCOUNTER — Telehealth: Payer: Self-pay

## 2018-05-17 ENCOUNTER — Other Ambulatory Visit: Payer: Self-pay

## 2018-05-17 DIAGNOSIS — Z5111 Encounter for antineoplastic chemotherapy: Secondary | ICD-10-CM | POA: Diagnosis not present

## 2018-05-17 DIAGNOSIS — C7951 Secondary malignant neoplasm of bone: Secondary | ICD-10-CM | POA: Diagnosis not present

## 2018-05-17 DIAGNOSIS — R590 Localized enlarged lymph nodes: Secondary | ICD-10-CM | POA: Diagnosis not present

## 2018-05-17 DIAGNOSIS — M25473 Effusion, unspecified ankle: Secondary | ICD-10-CM | POA: Diagnosis not present

## 2018-05-17 DIAGNOSIS — M2550 Pain in unspecified joint: Secondary | ICD-10-CM | POA: Diagnosis not present

## 2018-05-17 DIAGNOSIS — G893 Neoplasm related pain (acute) (chronic): Secondary | ICD-10-CM | POA: Diagnosis not present

## 2018-05-17 DIAGNOSIS — D649 Anemia, unspecified: Secondary | ICD-10-CM

## 2018-05-17 DIAGNOSIS — D6489 Other specified anemias: Secondary | ICD-10-CM | POA: Diagnosis not present

## 2018-05-17 DIAGNOSIS — M791 Myalgia, unspecified site: Secondary | ICD-10-CM | POA: Diagnosis not present

## 2018-05-17 DIAGNOSIS — C61 Malignant neoplasm of prostate: Secondary | ICD-10-CM

## 2018-05-17 DIAGNOSIS — Z7982 Long term (current) use of aspirin: Secondary | ICD-10-CM | POA: Diagnosis not present

## 2018-05-17 DIAGNOSIS — Z79899 Other long term (current) drug therapy: Secondary | ICD-10-CM | POA: Diagnosis not present

## 2018-05-17 LAB — VITAMIN D 25 HYDROXY (VIT D DEFICIENCY, FRACTURES): VIT D 25 HYDROXY: 65.5 ng/mL (ref 30.0–100.0)

## 2018-05-17 LAB — PSA, TOTAL AND FREE
PROSTATE SPECIFIC AG, SERUM: 121 ng/mL — AB (ref 0.0–4.0)
PSA, Free Pct: 22.7 %
PSA, Free: 27.5 ng/mL

## 2018-05-17 LAB — PREPARE RBC (CROSSMATCH)

## 2018-05-17 MED ORDER — ENZALUTAMIDE 40 MG PO CAPS: 160.0000 mg | ORAL_CAPSULE | Freq: Every day | ORAL | 1 refills | Status: DC

## 2018-05-17 NOTE — Telephone Encounter (Signed)
Oral Chemotherapy Pharmacist Encounter   I spoke with patient's daughter, [name], for overview of: Gillermina Phy (enzalutamide) for the treatment of metastatic, castration-resistant prostate cancer in conjunction with androgen deprivation therapy (Lupron injections), planned duration until disease progression or unacceptable toxicity.  Counseled on administration, dosing, side effects, monitoring, drug-food interactions, safe handling, storage, and disposal.  Patient will be started on reduced dose Xtandi at initiation and dose up-titrated to target dosing based on tolerance.  Patient will start taking Xtandi 40mg  capsules, 2 capsules (80mg ) by mouth once daily without regard to food.  Dose will by up-titrated based on tolerance to 4 capsules (160mg ) once daily  Patient will take his Xtandi in the morning.  Xtandi start date: 05/21/2018  Adverse effects include but are not limited to: peripheral edema, GI upset, hypertension, hot flashes, fatigue, falls/fractures, and arthralgias.   Patient instructed about small risk of seizures with Xtandi treatment.  Reviewed importance of keeping a medication schedule and plan for any missed doses.  [name] voiced understanding and appreciation.   [name] with questions about dronabinol and CBD oil. We discussed indication and proposed benefits of each.  All questions answered. Medication reconciliation performed and medication/allergy list updated.  [name] will pick up Mr. Bleicher's 1st fill of Xtandi from the [facility] on Monday afternoon. Test claim at the pharmacy revealed copayment $3.90 as patient has active Medicare/Medicaid.  [name] knows to call the office with questions or concerns.  Oral Oncology Clinic will continue to follow.  Johny Drilling, PharmD, BCPS, BCOP  05/17/2018   12:28 PM Oral Oncology Clinic [phone]

## 2018-05-17 NOTE — Telephone Encounter (Signed)
Oral Oncology Patient Advocate Encounter  Prior Authorization for Tony [name] has been approved.    PA# [id number] Effective dates: 05/17/18 through 05/01/19  Oral Oncology Clinic will continue to follow.   [place] Patient [place] Phone [phone] Fax [phone]

## 2018-05-17 NOTE — Telephone Encounter (Signed)
Oral Oncology Pharmacist Encounter  Discussed Xtandi titration schedule with MD. Patient should take Xtandi 80 mg daily for 2 weeks starting 05/21/2018. He can increase to Xtandi 120 mg daily on 06/04/2018 if the 80 mg once daily is well-tolerated. Office visit follow-up with Dr. Dr. [name] scheduled for 06/13/2018. Further up titration of Xtandi will be discussed with patient and family at that time.  I called and updated patient's daughter, [name], with above information. All questions answered. She understands and is in agreement with above plan. She knows to call the office with any additional questions or concerns.  Johny Drilling, PharmD, BCPS, BCOP  05/17/2018 3:34 PM Oral Oncology Clinic [phone]

## 2018-05-17 NOTE — Telephone Encounter (Signed)
Oral Oncology Pharmacist Encounter  Received new prescription for Xtandi (enzalutamide) for the treatment of metastatic, castration-resistant prostate cancer in conjunction with androgen deprivation therapy (Lupron injections), planned duration until disease progression or unacceptable toxicity.  Labs from 05/16/2018 assessed  Noted SCr=2.26, est CrCl~25 mL/min, no dose adjustments provided by manufacturer for renal dysfunction, has not been studied in severe renal dysfunction with CrCl < 30 mL/min Primary hepatic metabolism although 71% is excreted in urine Case reports show possible increase in adverse events and decreased toleration with Xtandi at recommended dosing of 160mg  daily. Discussed with MD increase in ADEs and the need for close follow-up during initiation of Xtandi  Noted low hemoglobin, MD aware  Gillermina Phy will be initiated at 80mg  daily and up-titrated as tolerated.  Current medication list in Epic reviewed, DDIs with Xtandi identified:  Xtandi and Fentanyl: Category X interaction: Recommendation is to avoid combination if possible as Xtandi may decrease the serum concentration of fentanyl due to induction of CYP3A4.  Patient will be counseled that fentanyl patch may lose effectiveness and to notify MD with increase in symptoms of pain.  Xtandi and BiDil, Flomax, and oxycodone: Category D interactions: All due to possible increased metabolism and decreased systemic exposure to the 3 listed medications due to induction of CYP3A4 by Xtandi.  Patient will be counseled about decreased effectiveness of listed medications.  No change to current therapy is indicated at this time.  Prescription has been e-scribed to the [facility] for benefits analysis and approval.  Oral Oncology Clinic will continue to follow for insurance authorization, copayment issues, initial counseling and start date.  Johny Drilling, PharmD, BCPS, BCOP  05/17/2018 9:37 AM Oral Oncology  Clinic [phone]

## 2018-05-17 NOTE — Telephone Encounter (Signed)
Oral Oncology Patient Advocate Encounter  Received notification from [facility] that prior authorization for Gillermina Phy is required.  PA submitted on CoverMyMeds Key [id number] Status is pending  Oral Oncology Clinic will continue to follow.  [place] Patient [place] Phone [phone] Fax [phone]

## 2018-05-18 ENCOUNTER — Inpatient Hospital Stay: Payer: Medicare Other

## 2018-05-18 DIAGNOSIS — D6489 Other specified anemias: Secondary | ICD-10-CM | POA: Diagnosis not present

## 2018-05-18 DIAGNOSIS — Z7982 Long term (current) use of aspirin: Secondary | ICD-10-CM | POA: Diagnosis not present

## 2018-05-18 DIAGNOSIS — C7951 Secondary malignant neoplasm of bone: Secondary | ICD-10-CM | POA: Diagnosis not present

## 2018-05-18 DIAGNOSIS — Z5111 Encounter for antineoplastic chemotherapy: Secondary | ICD-10-CM | POA: Diagnosis not present

## 2018-05-18 DIAGNOSIS — M25473 Effusion, unspecified ankle: Secondary | ICD-10-CM | POA: Diagnosis not present

## 2018-05-18 DIAGNOSIS — M791 Myalgia, unspecified site: Secondary | ICD-10-CM | POA: Diagnosis not present

## 2018-05-18 DIAGNOSIS — R590 Localized enlarged lymph nodes: Secondary | ICD-10-CM | POA: Diagnosis not present

## 2018-05-18 DIAGNOSIS — D649 Anemia, unspecified: Secondary | ICD-10-CM

## 2018-05-18 DIAGNOSIS — M2550 Pain in unspecified joint: Secondary | ICD-10-CM | POA: Diagnosis not present

## 2018-05-18 DIAGNOSIS — Z79899 Other long term (current) drug therapy: Secondary | ICD-10-CM | POA: Diagnosis not present

## 2018-05-18 DIAGNOSIS — G893 Neoplasm related pain (acute) (chronic): Secondary | ICD-10-CM | POA: Diagnosis not present

## 2018-05-18 MED ORDER — DIPHENHYDRAMINE HCL 25 MG PO CAPS
ORAL_CAPSULE | ORAL | Status: AC
  Filled 2018-05-18: qty 1

## 2018-05-18 MED ORDER — ACETAMINOPHEN 325 MG PO TABS
650.0000 mg | ORAL_TABLET | Freq: Once | ORAL | Status: AC
  Administered 2018-05-18: 650 mg via ORAL

## 2018-05-18 MED ORDER — FUROSEMIDE 10 MG/ML IJ SOLN
20.0000 mg | Freq: Once | INTRAMUSCULAR | Status: AC
  Administered 2018-05-18: 20 mg via INTRAVENOUS

## 2018-05-18 MED ORDER — ACETAMINOPHEN 325 MG PO TABS
ORAL_TABLET | ORAL | Status: AC
  Filled 2018-05-18: qty 2

## 2018-05-18 MED ORDER — FUROSEMIDE 10 MG/ML IJ SOLN
INTRAMUSCULAR | Status: AC
  Filled 2018-05-18: qty 2

## 2018-05-18 MED ORDER — DIPHENHYDRAMINE HCL 25 MG PO CAPS
25.0000 mg | ORAL_CAPSULE | Freq: Once | ORAL | Status: AC
  Administered 2018-05-18: 25 mg via ORAL

## 2018-05-18 NOTE — Patient Instructions (Signed)
Blood Transfusion, Adult, Care After This sheet gives you information about how to care for yourself after your procedure. Your doctor may also give you more specific instructions. If you have problems or questions, contact your doctor. Follow these instructions at home:   Take over-the-counter and prescription medicines only as told by your doctor.  Go back to your normal activities as told by your doctor.  Follow instructions from your doctor about how to take care of the area where an IV tube was put into your vein (insertion site). Make sure you: ? Wash your hands with soap and water before you change your bandage (dressing). If there is no soap and water, use hand sanitizer. ? Change your bandage as told by your doctor.  Check your IV insertion site every day for signs of infection. Check for: ? More redness, swelling, or pain. ? More fluid or blood. ? Warmth. ? Pus or a bad smell. Contact a doctor if:  You have more redness, swelling, or pain around the IV insertion site.  You have more fluid or blood coming from the IV insertion site.  Your IV insertion site feels warm to the touch.  You have pus or a bad smell coming from the IV insertion site.  Your pee (urine) turns pink, red, or brown.  You feel weak after doing your normal activities. Get help right away if:  You have signs of a serious allergic or body defense (immune) system reaction, including: ? Itchiness. ? Hives. ? Trouble breathing. ? Anxiety. ? Pain in your chest or lower back. ? Fever, flushing, and chills. ? Fast pulse. ? Rash. ? Watery poop (diarrhea). ? Throwing up (vomiting). ? Dark pee. ? Serious headache. ? Dizziness. ? Stiff neck. ? Yellow color in your face or the white parts of your eyes (jaundice). Summary  After a blood transfusion, return to your normal activities as told by your doctor.  Every day, check for signs of infection where the IV tube was put into your vein.  Some  signs of infection are warm skin, more redness and pain, more fluid or blood, and pus or a bad smell where the needle went in.  Contact your doctor if you feel weak or have any unusual symptoms. This information is not intended to replace advice given to you by your health care provider. Make sure you discuss any questions you have with your health care provider. Document Released: 05/08/2014 Document Revised: 12/10/2015 Document Reviewed: 12/10/2015 Elsevier Interactive Patient Education  2019 [employer]vier Inc.  

## 2018-05-20 LAB — TYPE AND SCREEN
ABO/RH(D): O POS
Antibody Screen: NEGATIVE
UNIT DIVISION: 0
Unit division: 0

## 2018-05-20 LAB — BPAM RBC
BLOOD PRODUCT EXPIRATION DATE: 202002202359
Blood Product Expiration Date: 202002202359
ISSUE DATE / TIME: 202001180845
ISSUE DATE / TIME: 202001180845
Unit Type and Rh: 5100
Unit Type and Rh: 5100

## 2018-05-20 MED FILL — XTANDI 40 MG CAPSULE: 40 | 30 days supply | Qty: 120 | Fill #0

## 2018-05-20 NOTE — Telephone Encounter (Signed)
Oral Oncology Patient Advocate Encounter  Confirmed with [facility] that Gillermina Phy was picked up on 05/20/18 with a $3.90 copay.   [place] Patient [place] Phone [phone] Fax [phone]

## 2018-05-23 DIAGNOSIS — R0609 Other forms of dyspnea: Secondary | ICD-10-CM | POA: Diagnosis not present

## 2018-05-23 DIAGNOSIS — R0600 Dyspnea, unspecified: Secondary | ICD-10-CM | POA: Diagnosis not present

## 2018-05-24 ENCOUNTER — Other Ambulatory Visit: Payer: Self-pay | Admitting: Family Medicine

## 2018-05-24 ENCOUNTER — Telehealth: Payer: Self-pay | Admitting: *Deleted

## 2018-05-24 MED ORDER — SENNOSIDES-DOCUSATE SODIUM 8.6-50 MG PO TABS: 2.0000 | ORAL_TABLET | Freq: Two times a day (BID) | ORAL | 2 refills | Status: DC

## 2018-05-24 NOTE — Telephone Encounter (Signed)
Daughter called - since starting Xtandi this week, father is experiencing pain in knees, thighs and arms. Daughter and patient asking if dose of patch should be increased or just take more pain oxycodone. Patient is reluctant to take much oxycodone due to resulting constipation. Per Dr. [name]: 1) Maintain starting dose of Xtandi until next appt. Do not increase as originally directed. 2)at this time, take oral pain med every 4 hours up to 6 times a day as directed.  Contacted daughter with Dr. Grier Mitts instructions. She verbalized understanding regarding Xtandi dose. Stated that her father might not take oral pain med due to constipating effect. Advised that Miralax should be taken daily and can take the Senna S 2 tablets twice a day. She stated he currently takes the Miralx daily, but is out of Senna S. Assured her that refill will be sent. Encouraged her to contact office on Monday with update regarding pain. She verbalized understanding.

## 2018-05-24 NOTE — Telephone Encounter (Signed)
Rx request. Dr.Parker never sent this medication.

## 2018-05-30 ENCOUNTER — Telehealth: Payer: Self-pay | Admitting: *Deleted

## 2018-05-30 ENCOUNTER — Other Ambulatory Visit: Payer: Self-pay | Admitting: Hematology

## 2018-05-30 ENCOUNTER — Other Ambulatory Visit: Payer: Self-pay | Admitting: *Deleted

## 2018-05-30 DIAGNOSIS — C61 Malignant neoplasm of prostate: Secondary | ICD-10-CM

## 2018-05-30 MED ORDER — FENTANYL 25 MCG/HR TD PT72: 1.0000 | MEDICATED_PATCH | TRANSDERMAL | 0 refills | Status: DC

## 2018-05-30 NOTE — Telephone Encounter (Signed)
Daughter Tony [name] called. Patient has been on Xtandi for 2 weeks now and experiencing chills and pain in hip. Per Dr. [name], have patient come to [facility] tomorrow for evaluation. Daughter in agreement, will have father here at 96;30 for lab work and 11:00 appt at [facility].

## 2018-05-30 NOTE — Progress Notes (Unsigned)
entanyl

## 2018-05-31 ENCOUNTER — Inpatient Hospital Stay: Payer: Medicare Other

## 2018-05-31 ENCOUNTER — Inpatient Hospital Stay (HOSPITAL_BASED_OUTPATIENT_CLINIC_OR_DEPARTMENT_OTHER): Payer: Medicare Other | Admitting: Medical

## 2018-05-31 VITALS — BP 153/78 | HR 72 | Temp 97.9°F | Resp 19 | Ht 67.0 in | Wt 176.3 lb

## 2018-05-31 DIAGNOSIS — Z79899 Other long term (current) drug therapy: Secondary | ICD-10-CM | POA: Diagnosis not present

## 2018-05-31 DIAGNOSIS — R590 Localized enlarged lymph nodes: Secondary | ICD-10-CM | POA: Diagnosis not present

## 2018-05-31 DIAGNOSIS — M791 Myalgia, unspecified site: Secondary | ICD-10-CM

## 2018-05-31 DIAGNOSIS — D6489 Other specified anemias: Secondary | ICD-10-CM | POA: Diagnosis not present

## 2018-05-31 DIAGNOSIS — C61 Malignant neoplasm of prostate: Secondary | ICD-10-CM | POA: Diagnosis not present

## 2018-05-31 DIAGNOSIS — M25473 Effusion, unspecified ankle: Secondary | ICD-10-CM | POA: Diagnosis not present

## 2018-05-31 DIAGNOSIS — C7951 Secondary malignant neoplasm of bone: Secondary | ICD-10-CM

## 2018-05-31 DIAGNOSIS — M2550 Pain in unspecified joint: Secondary | ICD-10-CM | POA: Diagnosis not present

## 2018-05-31 DIAGNOSIS — Z5111 Encounter for antineoplastic chemotherapy: Secondary | ICD-10-CM | POA: Diagnosis not present

## 2018-05-31 DIAGNOSIS — G893 Neoplasm related pain (acute) (chronic): Secondary | ICD-10-CM | POA: Diagnosis not present

## 2018-05-31 DIAGNOSIS — Z7982 Long term (current) use of aspirin: Secondary | ICD-10-CM | POA: Diagnosis not present

## 2018-05-31 LAB — CBC WITH DIFFERENTIAL (CANCER CENTER ONLY)
Abs Immature Granulocytes: 0.12 10*3/uL — ABNORMAL HIGH (ref 0.00–0.07)
Basophils Absolute: 0 10*3/uL (ref 0.0–0.1)
Basophils Relative: 1 %
Eosinophils Absolute: 0.1 10*3/uL (ref 0.0–0.5)
Eosinophils Relative: 2 %
HCT: 31 % — ABNORMAL LOW (ref 39.0–52.0)
HEMOGLOBIN: 9.7 g/dL — AB (ref 13.0–17.0)
Immature Granulocytes: 2 %
Lymphocytes Relative: 30 %
Lymphs Abs: 1.7 10*3/uL (ref 0.7–4.0)
MCH: 30.6 pg (ref 26.0–34.0)
MCHC: 31.3 g/dL (ref 30.0–36.0)
MCV: 97.8 fL (ref 80.0–100.0)
Monocytes Absolute: 0.5 10*3/uL (ref 0.1–1.0)
Monocytes Relative: 9 %
Neutro Abs: 3.2 10*3/uL (ref 1.7–7.7)
Neutrophils Relative %: 56 %
Platelet Count: 231 10*3/uL (ref 150–400)
RBC: 3.17 MIL/uL — AB (ref 4.22–5.81)
RDW: 16.1 % — ABNORMAL HIGH (ref 11.5–15.5)
WBC Count: 5.7 10*3/uL (ref 4.0–10.5)
nRBC: 0.5 % — ABNORMAL HIGH (ref 0.0–0.2)

## 2018-05-31 LAB — CMP (CANCER CENTER ONLY)
ALT: 7 U/L (ref 0–44)
AST: 13 U/L — AB (ref 15–41)
Albumin: 3.4 g/dL — ABNORMAL LOW (ref 3.5–5.0)
Alkaline Phosphatase: 229 U/L — ABNORMAL HIGH (ref 38–126)
Anion gap: 7 (ref 5–15)
BUN: 20 mg/dL (ref 8–23)
CO2: 24 mmol/L (ref 22–32)
Calcium: 8.3 mg/dL — ABNORMAL LOW (ref 8.9–10.3)
Chloride: 106 mmol/L (ref 98–111)
Creatinine: 2.07 mg/dL — ABNORMAL HIGH (ref 0.61–1.24)
GFR, EST NON AFRICAN AMERICAN: 28 mL/min — AB (ref >60)
GFR, Est AFR Am: 33 mL/min — ABNORMAL LOW (ref >60)
Glucose, Bld: 117 mg/dL — ABNORMAL HIGH (ref 70–99)
Potassium: 5.3 mmol/L — ABNORMAL HIGH (ref 3.5–5.1)
Sodium: 137 mmol/L (ref 135–145)
Total Bilirubin: 0.6 mg/dL (ref 0.3–1.2)
Total Protein: 6.9 g/dL (ref 6.5–8.1)

## 2018-06-06 NOTE — Progress Notes (Signed)
[facility] Progress Note   Tony [name] [id number] 1931-03-29 85 y.o.  [name] is managed by Dr. Sullivan Lone  Actively treated with chemotherapy/immunotherapy/hormonal therapy: yes  Current Therapy: Rolan Bucco, and Delton See  Last Treated: N/A  Assessment: Plan:    Myalgia  Arthralgia, unspecified joint  Malignant neoplasm of prostate ([place])   1) Metastatic prostate cancer:  The patient continues to be followed by Dr. [name] and is treated with Gillermina Phy, Lupron, and Delton See.  He is scheduled to see Dr. [name] for follow up on 06/13/2018.  2) Myalgias and arthralgias:  It was reviewed with the patient and his daughter that his symptoms could be associated with Xtandi.  He was told to attempt to remain active and to take Tylenol as needed.   Please see After Visit Summary for patient specific instructions.  Future Appointments  Date Time Provider Paraje  06/13/2018  9:15 AM [facility] LAB 2 [facility] None  06/13/2018  9:40 AM Brunetta Genera, MD [facility] None  06/13/2018 10:15 AM [facility] FLUSH [facility] None  07/11/2018 10:15 AM [facility] LAB 2 [facility] None  07/11/2018 10:45 AM [facility] [place] FLUSH [facility] None  08/13/2018 10:15 AM [facility] LAB 1 [facility] None  08/13/2018 10:45 AM [facility] [place] FLUSH [facility] None    No orders of the defined types were placed in this encounter.      Subjective:   Patient ID:  Tony [name] is a 85 y.o. (DOB [birth year]) male.  Chief Complaint:  Chief Complaint  Patient presents with  . Chills    HPI Tony [name] is a 85 y.o. male with a metastatic prostate cancer and myelopthisic anemia.  The patient is managed by Dr. [name] and is currently treated with [facility].  He was last seen on 05/16/2018.  The patient's daughter called yesterday reporting that the patient was having chills and hip pain.  A CBC returned today with a hemoglobin of 9.7 and a hematocrit of 31.0.  Medications:  I have reviewed the patient's current medications.  Allergies: No Known Allergies  Past Medical History:  Diagnosis Date  . BPH associated with nocturia    flomax 0.4--> 0.8 mg trial. nocturia if has coffee. some incontinence  . CAD (coronary artery disease)    LAD Stent 2012. 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   . Gout    no rx. apparently 1x in past  . History of stroke    no aspirin before stroke. no deficits. slurred words at time of stroke  . Hyperlipidemia   . Hypertension     Past Surgical History:  Procedure Laterality Date  . CARDIAC SURGERY     Stint  . CORONARY STENT PLACEMENT    . left arm fracture s/p surgery    . right leg fracture s.p surgery- screws like arm      Family History  Problem Relation Age of Onset  . Breast cancer Mother   . Heart disease Father   . Heart disease Brother        x2  . Prostate cancer Brother     Social History   Socioeconomic History  . Marital status: Married    Spouse name: Not on file  . Number of children: Not on file  . Years of education: Not on file  . Highest education level: Not on file  Occupational History  . Not on file  Social Needs  . Financial resource strain: Not on  file  . Food insecurity:    Worry: Not on file    Inability: Not on file  . Transportation needs:    Medical: Not on file    Non-medical: Not on file  Tobacco Use  . Smoking status: Former Smoker    Packs/day: 0.50    Years: 1.00    Pack years: 0.50    Types: Cigarettes    Last attempt to quit: 05/01/1952    Years since quitting: 66.1  . Smokeless tobacco: Never Used  Substance and Sexual Activity  . Alcohol use: No  . Drug use: No  . Sexual activity: Not on file  Lifestyle  . Physical activity:    Days per week: Not on file    Minutes per session: Not on file  . Stress: Not on file  Relationships  . Social connections:    Talks on phone: Not on file    Gets together: Not on file    Attends  religious service: Not on file    Active member of club or organization: Not on file    Attends meetings of clubs or organizations: Not on file    Relationship status: Not on file  . Intimate partner violence:    Fear of current or ex partner: Not on file    Emotionally abused: Not on file    Physically abused: Not on file    Forced sexual activity: Not on file  Other Topics Concern  . Not on file  Social History Narrative   Married- lives separate from wife. 2 children. Daughter passed from heart attack.       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   Please see review of systems for further details on the patient's review from today.   Review of Systems:  Review of Systems  Constitutional: Negative for chills, diaphoresis and fever.  HENT: Negative for trouble swallowing and voice change.   Respiratory: Negative for cough, chest tightness, shortness of breath and wheezing.   Cardiovascular: Negative for chest pain and palpitations.  Gastrointestinal: Negative for abdominal pain, constipation, diarrhea, nausea and vomiting.  Musculoskeletal: Positive for arthralgias and myalgias. Negative for back pain.  Neurological: Negative for dizziness, light-headedness and headaches.    Objective:   Physical Exam:  BP (!) 153/78 (BP Location: Left Arm, Patient Position: Sitting)   Pulse 72   Temp 97.9 F (36.6 C) (Oral)   Resp 19   Ht 5\' 7"  (1.702 m)   Wt 176 lb 4.8 oz (80 kg)   SpO2 100%   BMI 27.61 kg/m  ECOG: 1  Physical Exam Constitutional:      General: He is not in acute distress.    Appearance: He is not diaphoretic.  HENT:     Head: Normocephalic and atraumatic.  Cardiovascular:     Rate and Rhythm: Normal rate and regular rhythm.     Heart sounds: Normal heart sounds. No murmur. No friction rub. No gallop.     Pulmonary:     Effort: Pulmonary effort is normal. No respiratory distress.     Breath sounds: Normal breath sounds. No wheezing or rales.  Skin:    General: Skin is warm and dry.     Findings: No erythema or rash.  Neurological:     Mental Status: He is alert.     Gait:  Gait abnormal (The patient is ambulating with a wheelchair.).     Lab Review:     Component Value Date/Time   NA 137 05/31/2018 1050   K 5.3 (H) 05/31/2018 1050   CL 106 05/31/2018 1050   CO2 24 05/31/2018 1050   GLUCOSE 117 (H) 05/31/2018 1050   BUN 20 05/31/2018 1050   CREATININE 2.07 (H) 05/31/2018 1050   CALCIUM 8.3 (L) 05/31/2018 1050   PROT 6.9 05/31/2018 1050   ALBUMIN 3.4 (L) 05/31/2018 1050   AST 13 (L) 05/31/2018 1050   ALT 7 05/31/2018 1050   ALKPHOS 229 (H) 05/31/2018 1050   BILITOT 0.6 05/31/2018 1050   GFRNONAA 28 (L) 05/31/2018 1050   GFRAA 33 (L) 05/31/2018 1050       Component Value Date/Time   WBC 5.7 05/31/2018 1050   WBC 6.4 05/16/2018 1354   RBC 3.17 (L) 05/31/2018 1050   HGB 9.7 (L) 05/31/2018 1050   HCT 31.0 (L) 05/31/2018 1050   HCT 21.9 (L) 07/20/2017 2103   PLT 231 05/31/2018 1050   MCV 97.8 05/31/2018 1050   MCH 30.6 05/31/2018 1050   MCHC 31.3 05/31/2018 1050   RDW 16.1 (H) 05/31/2018 1050   LYMPHSABS 1.7 05/31/2018 1050   MONOABS 0.5 05/31/2018 1050   EOSABS 0.1 05/31/2018 1050   BASOSABS 0.0 05/31/2018 1050   -------------------------------  Imaging from last 24 hours (if applicable):  Radiology interpretation: No results found.

## 2018-06-12 NOTE — Progress Notes (Signed)
HEMATOLOGY/ONCOLOGY CLINIC NOTE  Date of Service: 06/13/18    Patient Care Team: Tony Barrack, MD as PCP - General (Family Medicine)  CHIEF COMPLAINTS/PURPOSE OF CONSULTATION:  -recently diagnosed metastatic prostate cancer -Myelopthisic anemia  HISTORY OF PRESENTING ILLNESS:   Tony Tony [name] is a wonderful 85 y.o. male who has been referred to [place] by Dr Tony Tony [name] for evaluation and management of anemia. He is accompanied Tony [name] by his daughter. The pt reports that he is doing well overall.   The pt reports a new onset of fatigue that began in January 2019. His daughter notes being able to tell a difference in his energy levels as early as November 2018. He notes that some cramping pain in his thighs. He notes that his recent 07/23/17 blood transfusion has led to a "terrific, immediate change" in his thigh pain. However, his thigh pain has returned in the last couple days.  He notes that he takes 1035mg Vitamin B12 daily.   He notes that for 6-7 years he took iron pills after his 2012 heart attack and stroke. He notes that he took a statin for 6-7 months and was taken off of it recently. He notes no unresolved symptoms from his stroke.   The pt notes that over the last 4-5 months his appetite has diminished and he has subsequently lost about 20 lbs in that time.  He notes that he has historically not preferred to drink water as such, but hydrates with juice, coffee, and other drinks.  He notes that he believes that he is able to take care of himself adequately at home. He also notes that his cane is sufficient for helping him get around.   Most recent lab results (07/22/17) of CBC  is as follows: all values are WNL except for RBC at 2.94, Hgb at 8.7, HCT at 27.2, RDW at 26.5, Platelets at 134k. CBC from 10/30/16 revealed all values WNL.  CMP 07/21/17 revealed all values WNL except for Sodium at 134, Glucose at 101, Creatinine at 1.85, Calcium at 7.9, Total Protein at 5.7, Albumin at  2.7, Alk Phos at 433.  LDH 07/20/17 elevated at 256. Haptoglobin 07/20/17 elevated at 357.  Vitamin B12 07/06/17 is WNL at 229.   On review of systems, pt reports decreased appetite, losing 20 lbs over 4-5 months, bilateral thigh pain, fatigue, mild leg swelling, and denies fevers, chills, night sweats, bone pains, nausea, abdominal pains, bleeding, blood in the urine, blood in the stools, black stools, changes in bowel habits, light headednss, dizziness, abdominal pains, noticing any new lumps or bumps, testicular pain or swelling, and any other symptoms.   On PMHx the pt reports heart attack and stroke in 2012. On Social Hx the pt denies much ETOH consumption.   Interval History:  Tony Tony [name] regarding his metastatic prostate cancer and myelopthisic anemia. The patient's last visit with [place] on 05/16/18. He is accompanied Tony [name] by his daughter and son in law. The pt reports that he is doing well overall.   The pt reports that he has been able to take 852mXtandi, but did develop some bone pains, and did not take his pain medications because he "doesn't want to get hooked on that stuff," but is willing to take these more now. He denies mouth sores or any other difficulties taking Xtandi. He has been moving his bowels well and has been taking Senna S and Miralax.   He notes that his appetite has  improved and he has been eating well, and recently began receiving meals on wheels. He notes that he is walking and exercising a little more, but notes that he has not been as mobile and active as he was when he was receiving PT. He denies getting out of the house and has "been enjoying sitting."  The pt notes that he has not been staying very well hydrated because this worsens his incontinence. He notes that sometimes his flow doesn't seem to flow properly, describing it as "spasmodic." He adds that he can sometimes sense when his bladder is full. He is taking Tamsulosin once a day. He  denies any pain when passing urine.  Lab results Tony [name] (06/13/18) of CBC w/diff and CMP is as follows: all values are WNL except for RBC at 3.42, HGB at 10.4, HCT at 34.0, RDW at 17.0, nRBC at 1.2%, Creatinine at 2.14, Calcium at 8.7, Alk Phos at 363, GFR at 31. 06/13/18 PSA is pending  On review of systems, pt reports eating better, stable weight, increased appetite, moving his bowels well, some bladder incontinence, stable energy levels, and denies mouth sores, abdominal pains, new bone pains, depression, discomfort passing urine, and any other symptoms.  MEDICAL HISTORY:  Past Medical History:  Diagnosis Date  . BPH associated with nocturia    flomax 0.4--> 0.8 mg trial. nocturia if has coffee. some incontinence  . CAD (coronary artery disease)    LAD Stent 2012. 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   . Gout    no rx. apparently 1x in past  . History of stroke    no aspirin before stroke. no deficits. slurred words at time of stroke  . Hyperlipidemia   . Hypertension     SURGICAL HISTORY: Past Surgical History:  Procedure Laterality Date  . CARDIAC SURGERY     Stint  . CORONARY STENT PLACEMENT    . left arm fracture s/p surgery    . right leg fracture s.p surgery- screws like arm      SOCIAL HISTORY: Social History   Socioeconomic History  . Marital status: Married    Spouse name: Not on file  . Number of children: Not on file  . Years of education: Not on file  . Highest education level: Not on file  Occupational History  . Not on file  Social Needs  . Financial resource strain: Not on file  . Food insecurity:    Worry: Not on file    Inability: Not on file  . Transportation needs:    Medical: Not on file    Non-medical: Not on file  Tobacco Use  . Smoking status: Former Smoker    Packs/day: 0.50    Years: 1.00    Pack years: 0.50    Types: Cigarettes    Last attempt to quit: 05/01/1952    Years since quitting: 66.1  . Smokeless  tobacco: Never Used  Substance and Sexual Activity  . Alcohol use: No  . Drug use: No  . Sexual activity: Not on file  Lifestyle  . Physical activity:    Days per week: Not on file    Minutes per session: Not on file  . Stress: Not on file  Relationships  . Social connections:    Talks on phone: Not on file    Gets together: Not on file    Attends religious service: Not on file    Active member of club or organization: Not on  file    Attends meetings of clubs or organizations: Not on file    Relationship status: Not on file  . Intimate partner violence:    Fear of current or ex partner: Not on file    Emotionally abused: Not on file    Physically abused: Not on file    Forced sexual activity: Not on file  Other Topics Concern  . Not on file  Social History Narrative   Married- lives separate from wife. 2 children. Daughter passed from heart attack.       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 Breast cancer Mother   . Heart disease Father   . Heart disease Brother        x2  . Prostate cancer Brother     ALLERGIES:  has No Known Allergies.  MEDICATIONS:  Current Outpatient Medications  Medication Sig Dispense Refill  . aspirin EC 81 MG tablet Take 1 tablet (81 mg total) by mouth daily. 90 tablet 3  . calcium-vitamin D (OSCAL 500/200 D-3) 500-200 MG-UNIT tablet Take 1 tablet by mouth daily with breakfast. 30 tablet 2  . enzalutamide (XTANDI) 40 MG capsule Take 4 capsules (160 mg total) by mouth daily. 120 capsule 1  . fentaNYL (DURAGESIC) 25 MCG/HR Place 1 patch onto the skin every 3 (three) days. 10 patch 0  . isosorbide-hydrALAZINE (BIDIL) 20-37.5 MG tablet Take 1 tablet by mouth 2 (two) times daily.    . metoprolol succinate (TOPROL-XL) 25 MG 24 hr tablet TAKE 1 TABLET BY MOUTH DAILY 90 tablet 0  . Multiple Vitamins-Minerals (MULTI COMPLETE PO) Take by mouth.     . nitroGLYCERIN (NITROSTAT) 0.4 MG SL tablet Place 1 tablet (0.4 mg total) under the tongue every 5 (five) minutes as needed for chest pain (3 maximum before seeking care). 30 tablet 0  . Omega-3 1000 MG CAPS Take 1 capsule by mouth daily.     Marland Kitchen oxyCODONE (OXY IR/ROXICODONE) 5 MG immediate release tablet Take 1 tablet (5 mg total) by mouth every 4 (four) hours as needed for severe pain. 90 tablet 0  . polyethylene glycol (MIRALAX) packet Take 17 g by mouth daily. 30 each 1  . senna-docusate (SENNA S) 8.6-50 MG tablet Take 2 tablets by mouth 2 (two) times daily. 120 tablet 2  . tamsulosin (FLOMAX) 0.4 MG CAPS capsule TAKE ONE CAPSULE BY MOUTH TWICE DAILY 180 capsule 3  . vitamin B-12 (CYANOCOBALAMIN) 1000 MCG tablet Take 1 tablet (1,000 mcg total) by mouth daily. 30 tablet 0  . Vitamin D, Ergocalciferol, (DRISDOL) 1.25 MG (50000 UT) CAPS capsule TAKE 1 CAPSULE BY MOUTH 2 TIMES A WEEK 24 capsule 0   No current facility-administered medications for this visit.     REVIEW OF SYSTEMS:    A 10+ POINT REVIEW OF SYSTEMS WAS OBTAINED including neurology, dermatology, psychiatry, cardiac, respiratory, lymph, extremities, GI, GU, Musculoskeletal, constitutional, breasts, reproductive, HEENT.  All pertinent positives are noted in the HPI.  All others are negative.   PHYSICAL EXAMINATION:  Vitals:   06/13/18 1015  BP: (!) 151/69  Pulse: 82  Resp: 18  Temp: 97.9 F (36.6 C)  SpO2: 100%   Filed Weights   06/13/18 1015  Weight: 175 lb 4.8 oz (79.5 kg)   .Body mass index is 27.46 kg/m.  GENERAL:alert, in no acute distress and comfortable SKIN: no acute rashes, no  significant lesions EYES: conjunctiva are pink and non-injected, sclera anicteric OROPHARYNX: MMM, no exudates, no oropharyngeal erythema or ulceration NECK: supple, no JVD LYMPH:  no palpable lymphadenopathy in the cervical, axillary or inguinal regions LUNGS: clear to auscultation b/l with normal respiratory effort HEART: regular  rate & rhythm ABDOMEN:  normoactive bowel sounds , non tender, not distended. No palpable hepatosplenomegaly.  Extremity: 1+ pedal edema PSYCH: alert & oriented x 3 with fluent speech NEURO: no focal motor/sensory deficits   LABORATORY DATA:  I have reviewed the data as listed  . CBC Latest Ref Rng & Units 06/13/2018 05/31/2018 05/16/2018  WBC 4.0 - 10.5 K/uL 5.7 5.7 6.4  Hemoglobin 13.0 - 17.0 g/dL 10.4(L) 9.7(L) 7.9(L)  Hematocrit 39.0 - 52.0 % 34.0(L) 31.0(L) 26.3(L)  Platelets 150 - 400 K/uL 176 231 204   . CBC    Component Value Date/Time   WBC 5.7 06/13/2018 0947   RBC 3.42 (L) 06/13/2018 0947   HGB 10.4 (L) 06/13/2018 0947   HGB 9.7 (L) 05/31/2018 1050   HCT 34.0 (L) 06/13/2018 0947   HCT 21.9 (L) 07/20/2017 2103   PLT 176 06/13/2018 0947   PLT 231 05/31/2018 1050   MCV 99.4 06/13/2018 0947   MCH 30.4 06/13/2018 0947   MCHC 30.6 06/13/2018 0947   RDW 17.0 (H) 06/13/2018 0947   LYMPHSABS 1.9 06/13/2018 0947   MONOABS 0.6 06/13/2018 0947   EOSABS 0.1 06/13/2018 0947   BASOSABS 0.0 06/13/2018 0947     . CMP Latest Ref Rng & Units 06/13/2018 05/31/2018 05/16/2018  Glucose 70 - 99 mg/dL 97 117(H) 105(H)  BUN 8 - 23 mg/dL 22 20 24(H)  Creatinine 0.61 - 1.24 mg/dL 2.14(H) 2.07(H) 2.26(H)  Sodium 135 - 145 mmol/L 136 137 134(L)  Potassium 3.5 - 5.1 mmol/L 4.9 5.3(H) 5.3(H)  Chloride 98 - 111 mmol/L 104 106 105  CO2 22 - 32 mmol/L _0 Calcium 8.9 - 10.3 mg/dL 8.7(L) 8.3(L) 8.3(L)  Total Protein 6.5 - 8.1 g/dL 7.2 6.9 7.1  Total Bilirubin 0.3 - 1.2 mg/dL 0.9 0.6 0.7  Alkaline Phos 38 - 126 U/L 363(H) 229(H) 183(H)  AST 15 - 41 U/L 15 13(L) 15  ALT 0 - 44 U/L _1 Component     Latest Ref Rng & Units 07/30/2017 09/06/2017 10/04/2017 10/31/2017  Prostate Specific Ag, Serum     0.0 - 4.0 ng/mL 1,186.0 (H) 433.8 (H) 67.4 (H) 55.8 (H)   07/31/17 BM Bx:   RADIOGRAPHIC STUDIES: I have personally reviewed the radiological images as listed and agreed with the findings  in the report. No results found.  ASSESSMENT & PLAN:   85 y.o. male with  1. Normocytic Anemia - due to primarily metastatic prostatic cancer (ACD + BM involvement with prostate cancer causing myelopthisic picture)  -Pt presented with a Hgb at 8.7 on 07/22/17, improved to 10.3 after receiving PRBC transfusion. -In review of the patient's previous CBC records, his anemia developed 6-7 months before presenting to care with me, unaccompanied by a drop in his EPO (07/20/17 elevated at 249.8);  LDH elevated  but haptoglobin at 357 on 07/20/17. -suggested against active hemolysis.  2. Recently diagnosed metastatic prostate cancer with extensive bone metastases. With bone mets/BM Mets and LNadenopathy. Exam positive for left retroperitoneal, bilateral pelvis and right inguinal adenopathy. Exam positive for left retroperitoneal, bilateral pelvis and right inguinal adenopathy. Diffuse sclerotic bone metastasis.   3. Elevated alkaline phosphatase due to bone metastases  from prostate cancer -CT BM bx -concerning for significant bone lesions in lower spine and pelvis PSA levels have declined to 26.9  4. Cancer related pain - primarily in b/l thighs. -much improved.  PLAN:  -Discussed pt labwork Tony [name], 06/13/18; HGB improved to 10.4, chemistries stable. -06/13/18 PSA is down to 26.9. Last available PSA serum from 05/16/18 was at 121.0 -Will order urine culture Tony [name] to rule out UTI -Increase 0.56m Tamsulosin to BID -Recommend urinating on a schedule. If currently mild incontinence continues to worsen, will refer the pt to Urology -The pt has no prohibitive toxicities from 869mXtandi, and will now increase to 12094mf Xtandi at this time. -Pt will consider marinol in the interim, which I advised as an option to treat his anorexia -Have discussed option for port placement  - patient wants to think about this. -Continue Lupron injections every 12 weeks -Continue Xgeva every 4 weeks  -Continue on 63m46mspirin and Flomax  -Continue Vitamin D and Calcium supplements. -Improved vit D levels with replacement, up to 65.5 on 05/16/18 -[facility] for [facility] -Regarding the patient's anemia, I havel set up transfusions prn for hgb<8 -Recommended that the pt continue to eat well, drink at least 48-64 oz of water each day, and walk 20-30 minutes each day. -Will see the pt back in one month  5. High risk for decubitus ulcers and having difficult with thigh pains that limit mobility. -hospital bed -limited ambulation    Lab Tony [name] for urine testing MD visit with labs and Injection on 07/11/2018 Continue Xgeva q4weeks  Continue Lupron q12weeks   All of the patients questions were answered with apparent satisfaction. The patient knows to call the clinic with any problems, questions or concerns.  The total time spent in the appt was 25 minutes and more than 50% was on counseling and direct patient cares.    GautSullivan LoneMS AAHIVMS SCH [facility]ology/Oncology Physician [facility]):       [phone] cell):  [phone]):           [phone] 10:58 AM  I, SchuBaldwin [place] acting as a scribe for Dr. GautSullivan Lone.I have reviewed the above documentation for accuracy and completeness, and I agree with the above. .GauBrunetta Genera

## 2018-06-13 ENCOUNTER — Inpatient Hospital Stay (HOSPITAL_BASED_OUTPATIENT_CLINIC_OR_DEPARTMENT_OTHER): Payer: Medicare Other | Admitting: Hematology

## 2018-06-13 ENCOUNTER — Inpatient Hospital Stay: Payer: Medicare Other

## 2018-06-13 ENCOUNTER — Ambulatory Visit: Payer: Medicare Other

## 2018-06-13 ENCOUNTER — Other Ambulatory Visit: Payer: Medicare Other

## 2018-06-13 ENCOUNTER — Telehealth: Payer: Self-pay | Admitting: Hematology

## 2018-06-13 ENCOUNTER — Inpatient Hospital Stay: Payer: Medicare Other | Attending: Hematology

## 2018-06-13 VITALS — BP 151/69 | HR 82 | Temp 97.9°F | Resp 18 | Ht 67.0 in | Wt 175.3 lb

## 2018-06-13 DIAGNOSIS — Z7982 Long term (current) use of aspirin: Secondary | ICD-10-CM | POA: Diagnosis not present

## 2018-06-13 DIAGNOSIS — Z87891 Personal history of nicotine dependence: Secondary | ICD-10-CM | POA: Insufficient documentation

## 2018-06-13 DIAGNOSIS — C61 Malignant neoplasm of prostate: Secondary | ICD-10-CM

## 2018-06-13 DIAGNOSIS — I10 Essential (primary) hypertension: Secondary | ICD-10-CM

## 2018-06-13 DIAGNOSIS — D649 Anemia, unspecified: Secondary | ICD-10-CM

## 2018-06-13 DIAGNOSIS — R748 Abnormal levels of other serum enzymes: Secondary | ICD-10-CM

## 2018-06-13 DIAGNOSIS — G893 Neoplasm related pain (acute) (chronic): Secondary | ICD-10-CM | POA: Insufficient documentation

## 2018-06-13 DIAGNOSIS — N390 Urinary tract infection, site not specified: Secondary | ICD-10-CM

## 2018-06-13 DIAGNOSIS — Z79899 Other long term (current) drug therapy: Secondary | ICD-10-CM | POA: Diagnosis not present

## 2018-06-13 DIAGNOSIS — C7951 Secondary malignant neoplasm of bone: Secondary | ICD-10-CM | POA: Insufficient documentation

## 2018-06-13 LAB — CMP (CANCER CENTER ONLY)
ALT: 6 U/L (ref 0–44)
ANION GAP: 5 (ref 5–15)
AST: 15 U/L (ref 15–41)
Albumin: 3.9 g/dL (ref 3.5–5.0)
Alkaline Phosphatase: 363 U/L — ABNORMAL HIGH (ref 38–126)
BUN: 22 mg/dL (ref 8–23)
CO2: 27 mmol/L (ref 22–32)
Calcium: 8.7 mg/dL — ABNORMAL LOW (ref 8.9–10.3)
Chloride: 104 mmol/L (ref 98–111)
Creatinine: 2.14 mg/dL — ABNORMAL HIGH (ref 0.61–1.24)
GFR, Est AFR Am: 31 mL/min — ABNORMAL LOW (ref >60)
GFR, Estimated: 27 mL/min — ABNORMAL LOW (ref >60)
Glucose, Bld: 97 mg/dL (ref 70–99)
POTASSIUM: 4.9 mmol/L (ref 3.5–5.1)
Sodium: 136 mmol/L (ref 135–145)
Total Bilirubin: 0.9 mg/dL (ref 0.3–1.2)
Total Protein: 7.2 g/dL (ref 6.5–8.1)

## 2018-06-13 LAB — URINALYSIS, COMPLETE (UACMP) WITH MICROSCOPIC
BILIRUBIN URINE: NEGATIVE
Glucose, UA: NEGATIVE mg/dL
Hgb urine dipstick: NEGATIVE
Ketones, ur: NEGATIVE mg/dL
Leukocytes,Ua: NEGATIVE
Nitrite: NEGATIVE
Protein, ur: NEGATIVE mg/dL
Specific Gravity, Urine: 1.005 (ref 1.005–1.030)
pH: 8 (ref 5.0–8.0)

## 2018-06-13 LAB — CBC WITH DIFFERENTIAL/PLATELET
Abs Immature Granulocytes: 0.06 10*3/uL (ref 0.00–0.07)
Basophils Absolute: 0 10*3/uL (ref 0.0–0.1)
Basophils Relative: 0 %
Eosinophils Absolute: 0.1 10*3/uL (ref 0.0–0.5)
Eosinophils Relative: 1 %
HCT: 34 % — ABNORMAL LOW (ref 39.0–52.0)
Hemoglobin: 10.4 g/dL — ABNORMAL LOW (ref 13.0–17.0)
Immature Granulocytes: 1 %
Lymphocytes Relative: 33 %
Lymphs Abs: 1.9 10*3/uL (ref 0.7–4.0)
MCH: 30.4 pg (ref 26.0–34.0)
MCHC: 30.6 g/dL (ref 30.0–36.0)
MCV: 99.4 fL (ref 80.0–100.0)
Monocytes Absolute: 0.6 10*3/uL (ref 0.1–1.0)
Monocytes Relative: 10 %
NEUTROS ABS: 3.1 10*3/uL (ref 1.7–7.7)
Neutrophils Relative %: 55 %
Platelets: 176 10*3/uL (ref 150–400)
RBC: 3.42 MIL/uL — ABNORMAL LOW (ref 4.22–5.81)
RDW: 17 % — ABNORMAL HIGH (ref 11.5–15.5)
WBC: 5.7 10*3/uL (ref 4.0–10.5)
nRBC: 1.2 % — ABNORMAL HIGH (ref 0.0–0.2)

## 2018-06-13 MED ORDER — DENOSUMAB 120 MG/1.7ML ~~LOC~~ SOLN
SUBCUTANEOUS | Status: AC
  Filled 2018-06-13: qty 1.7

## 2018-06-13 MED ORDER — DENOSUMAB 120 MG/1.7ML ~~LOC~~ SOLN
120.0000 mg | Freq: Once | SUBCUTANEOUS | Status: AC
  Administered 2018-06-13: 120 mg via SUBCUTANEOUS

## 2018-06-13 NOTE — Telephone Encounter (Signed)
Scheduled appt per 2/13 los.  Spoke with patient and patient aware of appt.  Printed and mailed calendar.

## 2018-06-13 NOTE — Patient Instructions (Signed)
Thank you for choosing [facility] to provide your oncology and hematology care.  To afford each patient quality time with our providers, please arrive 30 minutes before your scheduled appointment time.  If you arrive late for your appointment, you may be asked to reschedule.  We strive to give you quality time with our providers, and arriving late affects you and other patients whose appointments are after yours.    If you are a no show for multiple scheduled visits, you may be dismissed from the clinic at the providers discretion.     Again, thank you for choosing [facility], our hope is that these requests will decrease the amount of time that you wait before being seen by our physicians.  ______________________________________________________________________   Should you have questions after your visit to the [facility], please contact our office at [phone] between the hours of 8:30 and 4:30 p.m.    Voicemails left after 4:30p.m will not be returned until the following business day.     For prescription refill requests, please have your pharmacy contact us directly.  Please also try to allow 48 hours for prescription requests.     Please contact the scheduling department for questions regarding scheduling.  For scheduling of procedures such as PET scans, CT scans, MRI, Ultrasound, etc please contact central scheduling at [phone].     Resources For Cancer Patients and Caregivers:    Oncolink.org:  A wonderful resource for patients and healthcare providers for information regarding your disease, ways to tract your treatment, what to expect, etc.      American Cancer Society:  800-227-2345  Can help patients locate various types of support and financial assistance   Cancer Care: 1-800-813-HOPE (4673) Provides financial assistance, online support groups, medication/co-pay assistance.     [place] DSS:  336-641-3447 Where to apply  for food stamps, Medicaid, and utility assistance   Medicare Rights Center: 800-333-4114 Helps people with Medicare understand their rights and benefits, navigate the Medicare system, and secure the quality healthcare they deserve   SCAT: 336-333-6589 [employer] [employer]'s shared-ride transportation service for eligible riders who have a disability that prevents them from riding the fixed route bus.     For additional information on assistance programs please contact our social worker:   Abigail Elmore:  [phone]  

## 2018-06-13 NOTE — Progress Notes (Signed)
Per Dr. Ocie Bob to give [facility] with calcium 8.7.

## 2018-06-14 LAB — PROSTATE-SPECIFIC AG, SERUM (LABCORP): Prostate Specific Ag, Serum: 26.9 ng/mL — ABNORMAL HIGH (ref 0.0–4.0)

## 2018-06-15 LAB — URINE CULTURE: Culture: 30000 — AB

## 2018-06-16 MED ORDER — CEFPODOXIME PROXETIL 100 MG PO TABS: 100.0000 mg | ORAL_TABLET | Freq: Two times a day (BID) | ORAL | 0 refills | Status: AC

## 2018-06-18 ENCOUNTER — Telehealth: Payer: Self-pay | Admitting: *Deleted

## 2018-06-18 NOTE — Telephone Encounter (Signed)
Per Dr. Grier Mitts directions: Notified daughter [name] that urine culture showed bacteria in urine. Antibiotic called to pharmacy. Daughter verbalized understanding and states she will pick up on way home.

## 2018-06-23 DIAGNOSIS — R0600 Dyspnea, unspecified: Secondary | ICD-10-CM | POA: Diagnosis not present

## 2018-07-03 ENCOUNTER — Telehealth: Payer: Self-pay

## 2018-07-03 MED FILL — XTANDI 40 MG CAPSULE: 40 | 30 days supply | Qty: 120 | Fill #1

## 2018-07-03 NOTE — Telephone Encounter (Signed)
Oral Oncology Patient Advocate Encounter  I received notification from [place] that the [place] had been returned to stock. I called the patients daughter and she said that she would like to pick it up this week. Her dad still had some on hand since they had to gradually increase his dose. I let [place] know.  [place] Patient [place] Phone [phone] Fax [phone] 07/03/2018   12:18 PM

## 2018-07-04 ENCOUNTER — Other Ambulatory Visit: Payer: Self-pay | Admitting: Family Medicine

## 2018-07-05 ENCOUNTER — Other Ambulatory Visit: Payer: Self-pay | Admitting: Hematology

## 2018-07-05 ENCOUNTER — Other Ambulatory Visit: Payer: Self-pay | Admitting: *Deleted

## 2018-07-05 MED ORDER — FENTANYL 25 MCG/HR TD PT72: 1.0000 | MEDICATED_PATCH | TRANSDERMAL | 0 refills | Status: DC

## 2018-07-05 NOTE — Telephone Encounter (Signed)
Daughter [name] called to request refill of patient's Fentanyl. Refill request sent to Dr. [name]

## 2018-07-10 NOTE — Progress Notes (Signed)
HEMATOLOGY/ONCOLOGY CLINIC NOTE  Date of Service: 07/11/18    Patient Care Team: [name], MD as PCP - General (Family Medicine)  CHIEF COMPLAINTS/PURPOSE OF CONSULTATION:  -recently diagnosed metastatic prostate cancer -Myelopthisic anemia  HISTORY OF PRESENTING ILLNESS:   Tony [name] is a wonderful 85 y.o. male who has been referred to [place] by Dr Dimas Chyle for evaluation and management of anemia. He is accompanied today by his daughter. The pt reports that he is doing well overall.   The pt reports a new onset of fatigue that began in January 2019. His daughter notes being able to tell a difference in his energy levels as early as November 2018. He notes that some cramping pain in his thighs. He notes that his recent 07/23/17 blood transfusion has led to a "terrific, immediate change" in his thigh pain. However, his thigh pain has returned in the last couple days.  He notes that he takes 1018mg Vitamin B12 daily.   He notes that for 6-7 years he took iron pills after his 2012 heart attack and stroke. He notes that he took a statin for 6-7 months and was taken off of it recently. He notes no unresolved symptoms from his stroke.   The pt notes that over the last 4-5 months his appetite has diminished and he has subsequently lost about 20 lbs in that time.  He notes that he has historically not preferred to drink water as such, but hydrates with juice, coffee, and other drinks.  He notes that he believes that he is able to take care of himself adequately at home. He also notes that his cane is sufficient for helping him get around.   Most recent lab results (07/22/17) of CBC  is as follows: all values are WNL except for RBC at 2.94, Hgb at 8.7, HCT at 27.2, RDW at 26.5, Platelets at 134k. CBC from 10/30/16 revealed all values WNL.  CMP 07/21/17 revealed all values WNL except for Sodium at 134, Glucose at 101, Creatinine at 1.85, Calcium at 7.9, Total Protein at 5.7, Albumin at  2.7, Alk Phos at 433.  LDH 07/20/17 elevated at 256. Haptoglobin 07/20/17 elevated at 357.  Vitamin B12 07/06/17 is WNL at 229.   On review of systems, pt reports decreased appetite, losing 20 lbs over 4-5 months, bilateral thigh pain, fatigue, mild leg swelling, and denies fevers, chills, night sweats, bone pains, nausea, abdominal pains, bleeding, blood in the urine, blood in the stools, black stools, changes in bowel habits, light headednss, dizziness, abdominal pains, noticing any new lumps or bumps, testicular pain or swelling, and any other symptoms.   On PMHx the pt reports heart attack and stroke in 2012. On Social Hx the pt denies much ETOH consumption.   Interval History:  Mr. FJohnta Coutsreturns today regarding his metastatic prostate cancer and myelopthisic anemia. The patient's last visit with [place] on 06/13/18. He is accompanied today by his daughter. The pt reports that he is doing well overall.   The pt reports that he has been compliant with 1221mXtandi. The pt notes that he has had some fatigue when he first increased his Xtandi, which he describes as similar to "a hangover." The pt notes that this fatigue did not keep him from doing all the things he wants to do. 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  The pt notes that his walking "is a lot better," has continued on his fentanyl patch and has  only required one break through medication. He notes that he does continue to have pain, and prefers not to take many break through medications.  The pt notes that he is eating better and gained 2 pounds in the interim. The pt also notes that he is staying much better hydrated, but has been urinating in the night more frequently. The pt completed his course of antibiotics for his previous UTI in the interim.  Lab results today (07/11/18) of CBC w/diff is as follows: all values are WNL except for RBC at 3.20, HGB at 10.0, HCT at 32.6, MCV at 101.9, RDW at 16.6, nRBC at 0.4%. 07/11/18  CMP is pending 07/11/18 PSA is pending  On review of systems, pt reports instances of fatigue, eating better, controlled pain, walking better, and denies chills, abdominal pains, leg swelling, and any other symptoms.  MEDICAL HISTORY:  Past Medical History:  Diagnosis Date  . BPH associated with nocturia    flomax 0.4--> 0.8 mg trial. nocturia if has coffee. some incontinence  . CAD (coronary artery disease)    LAD Stent 2012. 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   . Gout    no rx. apparently 1x in past  . History of stroke    no aspirin before stroke. no deficits. slurred words at time of stroke  . Hyperlipidemia   . Hypertension     SURGICAL HISTORY: Past Surgical History:  Procedure Laterality Date  . CARDIAC SURGERY     Stint  . CORONARY STENT PLACEMENT    . left arm fracture s/p surgery    . right leg fracture s.p surgery- screws like arm      SOCIAL HISTORY: Social History   Socioeconomic History  . Marital status: Married    Spouse name: Not on file  . Number of children: Not on file  . Years of education: Not on file  . Highest education level: Not on file  Occupational History  . Not on file  Social Needs  . Financial resource strain: Not on file  . Food insecurity:    Worry: Not on file    Inability: Not on file  . Transportation needs:    Medical: Not on file    Non-medical: Not on file  Tobacco Use  . Smoking status: Former Smoker    Packs/day: 0.50    Years: 1.00    Pack years: 0.50    Types: Cigarettes    Last attempt to quit: 05/01/1952    Years since quitting: 66.2  . Smokeless tobacco: Never Used  Substance and Sexual Activity  . Alcohol use: No  . Drug use: No  . Sexual activity: Not on file  Lifestyle  . Physical activity:    Days per week: Not on file    Minutes per session: Not on file  . Stress: Not on file  Relationships  . Social connections:    Talks on phone: Not on file    Gets together: Not on file     Attends religious service: Not on file    Active member of club or organization: Not on file    Attends meetings of clubs or organizations: Not on file    Relationship status: Not on file  . Intimate partner violence:    Fear of current or ex partner: Not on file    Emotionally abused: Not on file    Physically abused: Not on file    Forced sexual activity: Not on  file  Other Topics Concern  . Not on file  Social History Narrative   Married- lives separate from wife. 2 children. Daughter passed from heart attack.       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 Breast cancer Mother   . Heart disease Father   . Heart disease Brother        x2  . Prostate cancer Brother     ALLERGIES:  has No Known Allergies.  MEDICATIONS:  Current Outpatient Medications  Medication Sig Dispense Refill  . aspirin EC 81 MG tablet Take 1 tablet (81 mg total) by mouth daily. 90 tablet 3  . calcium-vitamin D (OSCAL 500/200 D-3) 500-200 MG-UNIT tablet Take 1 tablet by mouth daily with breakfast. 30 tablet 2  . enzalutamide (XTANDI) 40 MG capsule Take 4 capsules (160 mg total) by mouth daily. 120 capsule 1  . fentaNYL (DURAGESIC) 25 MCG/HR Place 1 patch onto the skin every 3 (three) days. 10 patch 0  . isosorbide-hydrALAZINE (BIDIL) 20-37.5 MG tablet Take 1 tablet by mouth 2 (two) times daily.    . metoprolol succinate (TOPROL-XL) 25 MG 24 hr tablet TAKE 1 TABLET BY MOUTH DAILY 90 tablet 0  . Multiple Vitamins-Minerals (MULTI COMPLETE PO) Take by mouth.    . nitroGLYCERIN (NITROSTAT) 0.4 MG SL tablet Place 1 tablet (0.4 mg total) under the tongue every 5 (five) minutes as needed for chest pain (3 maximum before seeking care). 30 tablet 0  . Omega-3 1000 MG CAPS Take 1 capsule by mouth daily.     Marland Kitchen oxyCODONE (OXY IR/ROXICODONE) 5 MG immediate release tablet Take 1 tablet (5 mg total) by mouth every  4 (four) hours as needed for severe pain. 90 tablet 0  . polyethylene glycol (MIRALAX) packet Take 17 g by mouth daily. 30 each 1  . senna-docusate (SENNA S) 8.6-50 MG tablet Take 2 tablets by mouth 2 (two) times daily. 120 tablet 2  . tamsulosin (FLOMAX) 0.4 MG CAPS capsule TAKE ONE CAPSULE BY MOUTH TWICE DAILY 180 capsule 3  . vitamin B-12 (CYANOCOBALAMIN) 1000 MCG tablet Take 1 tablet (1,000 mcg total) by mouth daily. 30 tablet 0  . Vitamin D, Ergocalciferol, (DRISDOL) 1.25 MG (50000 UT) CAPS capsule TAKE 1 CAPSULE BY MOUTH 2 TIMES A WEEK 24 capsule 0   No current facility-administered medications for this visit.     REVIEW OF SYSTEMS:    A 10+ POINT REVIEW OF SYSTEMS WAS OBTAINED including neurology, dermatology, psychiatry, cardiac, respiratory, lymph, extremities, GI, GU, Musculoskeletal, constitutional, breasts, reproductive, HEENT.  All pertinent positives are noted in the HPI.  All others are negative.   PHYSICAL EXAMINATION:  Vitals:   07/11/18 1139  BP: (!) 165/78  Pulse: 84  Resp: 18  Temp: 97.7 F (36.5 C)  SpO2: 100%   Filed Weights   07/11/18 1139  Weight: 177 lb 9.6 oz (80.6 kg)   .Body mass index is 27.82 kg/m.  GENERAL:alert, in no acute distress and comfortable SKIN: no acute rashes, no significant lesions EYES: conjunctiva are pink and non-injected, sclera anicteric OROPHARYNX: MMM, no exudates, no oropharyngeal erythema or ulceration NECK: supple, no JVD LYMPH:  no palpable lymphadenopathy in the cervical, axillary or inguinal regions LUNGS: clear to auscultation b/l with normal respiratory effort HEART: regular rate & rhythm ABDOMEN:  normoactive bowel sounds , non tender, not distended. No palpable hepatosplenomegaly.  Extremity: 1+ pedal edema PSYCH: alert & oriented x 3 with fluent speech NEURO: no focal motor/sensory deficits   LABORATORY DATA:  I have reviewed the data as listed  . CBC Latest Ref Rng & Units 07/11/2018 06/13/2018 05/31/2018   WBC 4.0 - 10.5 K/uL 5.0 5.7 5.7  Hemoglobin 13.0 - 17.0 g/dL 10.0(L) 10.4(L) 9.7(L)  Hematocrit 39.0 - 52.0 % 32.6(L) 34.0(L) 31.0(L)  Platelets 150 - 400 K/uL 178 176 231   . CBC    Component Value Date/Time   WBC 5.0 07/11/2018 1108   RBC 3.20 (L) 07/11/2018 1108   HGB 10.0 (L) 07/11/2018 1108   HGB 9.7 (L) 05/31/2018 1050   HCT 32.6 (L) 07/11/2018 1108   HCT 21.9 (L) 07/20/2017 2103   PLT 178 07/11/2018 1108   PLT 231 05/31/2018 1050   MCV 101.9 (H) 07/11/2018 1108   MCH 31.3 07/11/2018 1108   MCHC 30.7 07/11/2018 1108   RDW 16.6 (H) 07/11/2018 1108   LYMPHSABS 1.7 07/11/2018 1108   MONOABS 0.6 07/11/2018 1108   EOSABS 0.1 07/11/2018 1108   BASOSABS 0.0 07/11/2018 1108     . CMP Latest Ref Rng & Units 07/11/2018 06/13/2018 05/31/2018  Glucose 70 - 99 mg/dL 103(H) 97 117(H)  BUN 8 - 23 mg/dL 25(H) 22 20  Creatinine 0.61 - 1.24 mg/dL 2.06(H) 2.14(H) 2.07(H)  Sodium 135 - 145 mmol/L 136 136 137  Potassium 3.5 - 5.1 mmol/L 4.7 4.9 5.3(H)  Chloride 98 - 111 mmol/L 105 104 106  CO2 22 - 32 mmol/L _0 Calcium 8.9 - 10.3 mg/dL 8.1(L) 8.7(L) 8.3(L)  Total Protein 6.5 - 8.1 g/dL 6.8 7.2 6.9  Total Bilirubin 0.3 - 1.2 mg/dL 0.8 0.9 0.6  Alkaline Phos 38 - 126 U/L 207(H) 363(H) 229(H)  AST 15 - 41 U/L 17 15 13(L)  ALT 0 - 44 U/L _1 Component     Latest Ref Rng & Units 07/30/2017 09/06/2017 10/04/2017 10/31/2017  Prostate Specific Ag, Serum     0.0 - 4.0 ng/mL 1,186.0 (H) 433.8 (H) 67.4 (H) 55.8 (H)   07/31/17 BM Bx:   RADIOGRAPHIC STUDIES: I have personally reviewed the radiological images as listed and agreed with the findings in the report. No results found.  ASSESSMENT & PLAN:   85 y.o. male with  1. Normocytic Anemia - due to primarily metastatic prostatic cancer (ACD + BM involvement with prostate cancer causing myelopthisic picture)  -Pt presented with a Hgb at 8.7 on 07/22/17, improved to 10.3 after receiving PRBC transfusion. -In review of the patient's  previous CBC records, his anemia developed 6-7 months before presenting to care with me, unaccompanied by a drop in his EPO (07/20/17 elevated at 249.8);  LDH elevated  but haptoglobin at 357 on 07/20/17. -suggested against active hemolysis.  2. Recently diagnosed metastatic prostate cancer with extensive bone metastases. With bone mets/BM Mets and LNadenopathy. Exam positive for left retroperitoneal, bilateral pelvis and right inguinal adenopathy. Exam positive for left retroperitoneal, bilateral pelvis and right inguinal adenopathy. Diffuse sclerotic bone metastasis.   3. Elevated alkaline phosphatase due to bone metastases from prostate cancer -CT BM bx -concerning for significant bone lesions in lower spine and pelvis PSA levels have declined to 26.9  4. Cancer related pain - primarily in b/l thighs. -much improved.  PLAN:  -Discussed pt labwork today, 07/11/18; HGB holding at 10.0, last transfusion was 2 months ago. -07/11/18 PSA is contnuing to improve to 21.4. Last PSA  from 06/13/18 was improved to 26.9 -The pt has no prohibitive toxicities from continuing 145m Xtandi at this time. Will consider increasing to 166mat next visit. -Recommend urinating on a schedule. If currently mild incontinence continues to worsen, will refer the pt to Urology -Pt will consider marinol in the interim, which I advised as an option to treat his anorexia -Have discussed option for port placement  - patient wants to think about this. -Nutritional therapy referral to [facility]-Continue elevating feet for mild ankle swelling -Continue 0.103m37mamsulosin BID -Continue Lupron injections every 12 weeks -Continue Xgeva every 4 weeks  -Continue on 43m66mpirin and Flomax  -Continue Vitamin D and Calcium supplements. -Improved vit D levels with replacement, up to 65.5 on 05/16/18 -Regarding the patient's anemia, I havel set up transfusions prn for hgb<8 -Recommended that the pt continue to eat well, drink at  least 48-64 oz of water each day, and walk 20-30 minutes each day. -Will see the pt back in 4 weeks  5. High risk for decubitus ulcers and having difficult with thigh pains that limit mobility. -hospital bed -limited ambulation    Continue Xgeva q4weeks  Continue Lupron q12weeks RTC with Dr KaleIrene Limboh labs in 4 weeks with next dose of Xgeva   All of the patients questions were answered with apparent satisfaction. The patient knows to call the clinic with any problems, questions or concerns.  The total time spent in the appt was 30 minutes and more than 50% was on counseling and direct patient cares.   GautSullivan LoneMS AAHIVMS [facility] Hospital[facility]/Oncology Physician [facility]):       [phone] cell):  [phone]):           [phone]/2020 12:17 PM  I, SchuBaldwin [place] acting as a scribe for Dr. GautSullivan Lone.I have reviewed the above documentation for accuracy and completeness, and I agree with the above. .GauBrunetta Genera

## 2018-07-11 ENCOUNTER — Inpatient Hospital Stay: Payer: Medicare Other

## 2018-07-11 ENCOUNTER — Ambulatory Visit: Payer: Medicare Other

## 2018-07-11 ENCOUNTER — Telehealth: Payer: Self-pay | Admitting: Hematology

## 2018-07-11 ENCOUNTER — Other Ambulatory Visit: Payer: Medicare Other

## 2018-07-11 ENCOUNTER — Inpatient Hospital Stay (HOSPITAL_BASED_OUTPATIENT_CLINIC_OR_DEPARTMENT_OTHER): Payer: Medicare Other | Admitting: Hematology

## 2018-07-11 ENCOUNTER — Other Ambulatory Visit: Payer: Self-pay

## 2018-07-11 ENCOUNTER — Inpatient Hospital Stay: Payer: Medicare Other | Attending: Hematology

## 2018-07-11 VITALS — BP 165/78 | HR 84 | Temp 97.7°F | Resp 18 | Ht 67.0 in | Wt 177.6 lb

## 2018-07-11 DIAGNOSIS — R748 Abnormal levels of other serum enzymes: Secondary | ICD-10-CM | POA: Diagnosis not present

## 2018-07-11 DIAGNOSIS — G893 Neoplasm related pain (acute) (chronic): Secondary | ICD-10-CM | POA: Insufficient documentation

## 2018-07-11 DIAGNOSIS — D6182 Myelophthisis: Secondary | ICD-10-CM | POA: Diagnosis not present

## 2018-07-11 DIAGNOSIS — C7951 Secondary malignant neoplasm of bone: Secondary | ICD-10-CM

## 2018-07-11 DIAGNOSIS — C61 Malignant neoplasm of prostate: Secondary | ICD-10-CM

## 2018-07-11 DIAGNOSIS — Z7982 Long term (current) use of aspirin: Secondary | ICD-10-CM

## 2018-07-11 DIAGNOSIS — Z87891 Personal history of nicotine dependence: Secondary | ICD-10-CM

## 2018-07-11 DIAGNOSIS — Z79899 Other long term (current) drug therapy: Secondary | ICD-10-CM | POA: Diagnosis not present

## 2018-07-11 LAB — CMP (CANCER CENTER ONLY)
ALT: 7 U/L (ref 0–44)
AST: 17 U/L (ref 15–41)
Albumin: 3.6 g/dL (ref 3.5–5.0)
Alkaline Phosphatase: 207 U/L — ABNORMAL HIGH (ref 38–126)
Anion gap: 9 (ref 5–15)
BUN: 25 mg/dL — ABNORMAL HIGH (ref 8–23)
CO2: 22 mmol/L (ref 22–32)
Calcium: 8.1 mg/dL — ABNORMAL LOW (ref 8.9–10.3)
Chloride: 105 mmol/L (ref 98–111)
Creatinine: 2.06 mg/dL — ABNORMAL HIGH (ref 0.61–1.24)
GFR, Est AFR Am: 33 mL/min — ABNORMAL LOW (ref >60)
GFR, Estimated: 28 mL/min — ABNORMAL LOW (ref >60)
GLUCOSE: 103 mg/dL — AB (ref 70–99)
Potassium: 4.7 mmol/L (ref 3.5–5.1)
SODIUM: 136 mmol/L (ref 135–145)
Total Bilirubin: 0.8 mg/dL (ref 0.3–1.2)
Total Protein: 6.8 g/dL (ref 6.5–8.1)

## 2018-07-11 LAB — CBC WITH DIFFERENTIAL/PLATELET
ABS IMMATURE GRANULOCYTES: 0.03 10*3/uL (ref 0.00–0.07)
Basophils Absolute: 0 10*3/uL (ref 0.0–0.1)
Basophils Relative: 0 %
Eosinophils Absolute: 0.1 10*3/uL (ref 0.0–0.5)
Eosinophils Relative: 1 %
HCT: 32.6 % — ABNORMAL LOW (ref 39.0–52.0)
Hemoglobin: 10 g/dL — ABNORMAL LOW (ref 13.0–17.0)
Immature Granulocytes: 1 %
Lymphocytes Relative: 35 %
Lymphs Abs: 1.7 10*3/uL (ref 0.7–4.0)
MCH: 31.3 pg (ref 26.0–34.0)
MCHC: 30.7 g/dL (ref 30.0–36.0)
MCV: 101.9 fL — ABNORMAL HIGH (ref 80.0–100.0)
Monocytes Absolute: 0.6 10*3/uL (ref 0.1–1.0)
Monocytes Relative: 12 %
NRBC: 0.4 % — AB (ref 0.0–0.2)
Neutro Abs: 2.6 10*3/uL (ref 1.7–7.7)
Neutrophils Relative %: 51 %
Platelets: 178 10*3/uL (ref 150–400)
RBC: 3.2 MIL/uL — ABNORMAL LOW (ref 4.22–5.81)
RDW: 16.6 % — ABNORMAL HIGH (ref 11.5–15.5)
WBC: 5 10*3/uL (ref 4.0–10.5)

## 2018-07-11 LAB — SAMPLE TO BLOOD BANK

## 2018-07-11 MED ORDER — DENOSUMAB 120 MG/1.7ML ~~LOC~~ SOLN
120.0000 mg | Freq: Once | SUBCUTANEOUS | Status: AC
  Administered 2018-07-11: 120 mg via SUBCUTANEOUS

## 2018-07-11 MED ORDER — DENOSUMAB 120 MG/1.7ML ~~LOC~~ SOLN
SUBCUTANEOUS | Status: AC
  Filled 2018-07-11: qty 1.7

## 2018-07-11 NOTE — Progress Notes (Signed)
Per Dr. [name] ok to give xgeva with calcium of 8.1

## 2018-07-11 NOTE — Patient Instructions (Signed)

## 2018-07-11 NOTE — Telephone Encounter (Signed)
Scheduled appt per 3/12 los.

## 2018-07-12 LAB — PROSTATE-SPECIFIC AG, SERUM (LABCORP): Prostate Specific Ag, Serum: 21.4 ng/mL — ABNORMAL HIGH (ref 0.0–4.0)

## 2018-07-22 DIAGNOSIS — D6182 Myelophthisis: Secondary | ICD-10-CM | POA: Diagnosis not present

## 2018-07-22 DIAGNOSIS — G893 Neoplasm related pain (acute) (chronic): Secondary | ICD-10-CM | POA: Diagnosis not present

## 2018-07-22 DIAGNOSIS — R269 Unspecified abnormalities of gait and mobility: Secondary | ICD-10-CM | POA: Diagnosis not present

## 2018-08-01 ENCOUNTER — Other Ambulatory Visit: Payer: Self-pay | Admitting: *Deleted

## 2018-08-01 ENCOUNTER — Other Ambulatory Visit: Payer: Self-pay | Admitting: Hematology

## 2018-08-01 DIAGNOSIS — C61 Malignant neoplasm of prostate: Secondary | ICD-10-CM

## 2018-08-01 NOTE — Telephone Encounter (Signed)
Daughter [name] called: requested refill of Fentanyl Patch

## 2018-08-02 ENCOUNTER — Other Ambulatory Visit: Payer: Self-pay | Admitting: Hematology

## 2018-08-02 MED ORDER — FENTANYL 25 MCG/HR TD PT72: 1.0000 | MEDICATED_PATCH | TRANSDERMAL | 0 refills | Status: DC

## 2018-08-02 MED FILL — XTANDI 40 MG CAPSULE: 40 | 30 days supply | Qty: 120 | Fill #0

## 2018-08-08 ENCOUNTER — Other Ambulatory Visit: Payer: Medicare Other

## 2018-08-08 ENCOUNTER — Ambulatory Visit: Payer: Medicare Other

## 2018-08-09 ENCOUNTER — Other Ambulatory Visit: Payer: Self-pay | Admitting: Family Medicine

## 2018-08-12 NOTE — Progress Notes (Signed)
HEMATOLOGY/ONCOLOGY CLINIC NOTE  Date of Service: 08/13/18    Patient Care Team: Tony Barrack, MD as PCP - General (Family Medicine)  CHIEF COMPLAINTS/PURPOSE OF CONSULTATION:  -recently diagnosed metastatic prostate cancer -Myelopthisic anemia  HISTORY OF PRESENTING ILLNESS:   Tony [name] is a wonderful 85 y.o. male who has been referred to [place] by Dr Tony [name] for evaluation and management of anemia. He is accompanied today by his daughter. The pt reports that he is doing well overall.   The pt reports a new onset of fatigue that began in January 2019. His daughter notes being able to tell a difference in his energy levels as early as November 2018. He notes that some cramping pain in his thighs. He notes that his recent 07/23/17 blood transfusion has led to a "terrific, immediate change" in his thigh pain. However, his thigh pain has returned in the last couple days.  He notes that he takes 1040mg Vitamin B12 daily.   He notes that for 6-7 years he took iron pills after his 2012 heart attack and stroke. He notes that he took a statin for 6-7 months and was taken off of it recently. He notes no unresolved symptoms from his stroke.   The pt notes that over the last 4-5 months his appetite has diminished and he has subsequently lost about 20 lbs in that time.  He notes that he has historically not preferred to drink water as such, but hydrates with juice, coffee, and other drinks.  He notes that he believes that he is able to take care of himself adequately at home. He also notes that his cane is sufficient for helping him get around.   Most recent lab results (07/22/17) of CBC  is as follows: all values are WNL except for RBC at 2.94, Hgb at 8.7, HCT at 27.2, RDW at 26.5, Platelets at 134k. CBC from 10/30/16 revealed all values WNL.  CMP 07/21/17 revealed all values WNL except for Sodium at 134, Glucose at 101, Creatinine at 1.85, Calcium at 7.9, Total Protein at 5.7, Albumin at  2.7, Alk Phos at 433.  LDH 07/20/17 elevated at 256. Haptoglobin 07/20/17 elevated at 357.  Vitamin B12 07/06/17 is WNL at 229.   On review of systems, pt reports decreased appetite, losing 20 lbs over 4-5 months, bilateral thigh pain, fatigue, mild leg swelling, and denies fevers, chills, night sweats, bone pains, nausea, abdominal pains, bleeding, blood in the urine, blood in the stools, black stools, changes in bowel habits, light headednss, dizziness, abdominal pains, noticing any new lumps or bumps, testicular pain or swelling, and any other symptoms.   On PMHx the pt reports heart attack and stroke in 2012. On Social Hx the pt denies much ETOH consumption.   Interval History:  Mr. Tony Tabarreturns today regarding his metastatic prostate cancer and myelopthisic anemia. The patient's last visit with [place] on 07/11/18. He is accompanied today by his daughter via cell phone. The pt reports that he is doing well overall.   The pt reports that he has had some hot flashes in the interim, which has been bothersome for him. The pt notes that his hot flashes occur most every day, and especially at night. He notes that these last 3-4 hours. The pt notes chills as well at other times. The pt notes that his left knee has been painful in the interim, but this leveled off a couple days ago. He notes that he is trying  to be as active as he can, but sometimes he "just doesn't feel like it." The pt notes that he has been eating well. The pt notes that his urine is flowing much better and denies blood in the urine.  Lab results today (08/13/18) of CBC w/diff and CMP is as follows: all values are WNL except for RBC at 3.38, HGB at 10.9, HCT at 35.2, MCV at 104.1, nRBC at 0.6, Potassium at 5.5 ,CO2 at 18, Glucose at 101, BUN at 24, Creatinine at 1.99, Calcium at 8.0, GFR at 34.  On review of systems, pt reports eating well, hot flashes, occasional chills, stable energy levels, recent left knee pain, and denies  problems passing urine, leg swelling, and any other symptoms.   MEDICAL HISTORY:  Past Medical History:  Diagnosis Date   BPH associated with nocturia    flomax 0.4--> 0.8 mg trial. nocturia if has coffee. some incontinence   CAD (coronary artery disease)    LAD Stent 2012. 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    Gout    no rx. apparently 1x in past   History of stroke    no aspirin before stroke. no deficits. slurred words at time of stroke   Hyperlipidemia    Hypertension     SURGICAL HISTORY: Past Surgical History:  Procedure Laterality Date   CARDIAC SURGERY     Stint   CORONARY STENT PLACEMENT     left arm fracture s/p surgery     right leg fracture s.p surgery- screws like arm      SOCIAL HISTORY: Social History   Socioeconomic History   Marital status: Married    Spouse name: Not on file   Number of children: Not on file   Years of education: Not on file   Highest education level: Not on file  Occupational History   Not on file  Social Needs   Financial resource strain: Not on file   Food insecurity:    Worry: Not on file    Inability: Not on file   Transportation needs:    Medical: Not on file    Non-medical: Not on file  Tobacco Use   Smoking status: Former Smoker    Packs/day: 0.50    Years: 1.00    Pack years: 0.50    Types: Cigarettes    Last attempt to quit: 05/01/1952    Years since quitting: 66.3   Smokeless tobacco: Never Used  Substance and Sexual Activity   Alcohol use: No   Drug use: No   Sexual activity: Not on file  Lifestyle   Physical activity:    Days per week: Not on file    Minutes per session: Not on file   Stress: Not on file  Relationships   Social connections:    Talks on phone: Not on file    Gets together: Not on file    Attends religious service: Not on file    Active member of club or organization: Not on file    Attends meetings of clubs or organizations: Not on  file    Relationship status: Not on file   Intimate partner violence:    Fear of current or ex partner: Not on file    Emotionally abused: Not on file    Physically abused: Not on file    Forced sexual activity: Not on file  Other Topics Concern   Not on file  Social History Narrative   Married-  lives separate from wife. 2 children. Daughter passed from heart attack.       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  MEDICATIONS:  Current Outpatient Medications  Medication Sig Dispense Refill   aspirin EC 81 MG tablet Take 1 tablet (81 mg total) by mouth daily. 90 tablet 3   BIDIL 20-37.5 MG tablet TAKE 1 TABLET BY MOUTH TWICE DAILY 180 tablet 0   calcium-vitamin D (OSCAL 500/200 D-3) 500-200 MG-UNIT tablet Take 1 tablet by mouth daily with breakfast. 30 tablet 2   fentaNYL (DURAGESIC) 25 MCG/HR Place 1 patch onto the skin every 3 (three) days. 10 patch 0   metoprolol succinate (TOPROL-XL) 25 MG 24 hr tablet TAKE 1 TABLET BY MOUTH DAILY 90 tablet 0   Multiple Vitamins-Minerals (MULTI COMPLETE PO) Take by mouth.     nitroGLYCERIN (NITROSTAT) 0.4 MG SL tablet Place 1 tablet (0.4 mg total) under the tongue every 5 (five) minutes as needed for chest pain (3 maximum before seeking care). 30 tablet 0   Omega-3 1000 MG CAPS Take 1 capsule by mouth daily.      oxyCODONE (OXY IR/ROXICODONE) 5 MG immediate release tablet Take 1 tablet (5 mg total) by mouth every 4 (four) hours as needed for severe pain. 90 tablet 0   polyethylene glycol (MIRALAX) packet Take 17 g by mouth daily. 30 each 1   senna-docusate (SENNA S) 8.6-50 MG tablet Take 2 tablets by mouth 2 (two) times daily. 120 tablet 2   tamsulosin (FLOMAX) 0.4 MG CAPS  capsule TAKE ONE CAPSULE BY MOUTH TWICE DAILY 180 capsule 3   vitamin B-12 (CYANOCOBALAMIN) 1000 MCG tablet Take 1 tablet (1,000 mcg total) by mouth daily. 30 tablet 0   Vitamin D, Ergocalciferol, (DRISDOL) 1.25 MG (50000 UT) CAPS capsule TAKE 1 CAPSULE BY MOUTH 2 TIMES A WEEK 24 capsule 3   XTANDI 40 MG capsule TAKE 4 CAPSULES (160 MG TOTAL) BY MOUTH DAILY. 120 capsule 1   No current facility-administered medications for this visit.    Facility-Administered Medications Ordered in Other Visits  Medication Dose Route Frequency Provider Last Rate Last Dose   denosumab (XGEVA) injection 120 mg  120 mg Subcutaneous Once Brunetta Genera, MD       leuprolide (LUPRON) injection 22.5 mg  22.5 mg Intramuscular Once Brunetta Genera, MD        REVIEW OF SYSTEMS:    A 10+ POINT REVIEW OF SYSTEMS WAS OBTAINED including neurology, dermatology, psychiatry, cardiac, respiratory, lymph, extremities, GI, GU, Musculoskeletal, constitutional, breasts, reproductive, HEENT.  All pertinent positives are noted in the HPI.  All others are negative.   PHYSICAL EXAMINATION:  Vitals:   08/13/18 1052  BP: (!) 164/79  Pulse: 82  Resp: (!) 8  Temp: 98.1 F (36.7 C)  SpO2: 100%   Filed Weights   08/13/18 1052  Weight: 172 lb 14.4 oz (78.4 kg)   .Body mass index is 27.08 kg/m.  GENERAL:alert, in no acute distress and comfortable SKIN: no acute rashes, no significant lesions EYES: conjunctiva are pink and non-injected, sclera anicteric OROPHARYNX: MMM, no exudates, no oropharyngeal erythema or ulceration NECK: supple, no JVD  LYMPH:  no palpable lymphadenopathy in the cervical, axillary or inguinal regions LUNGS: clear to auscultation b/l with normal respiratory effort HEART: regular rate & rhythm ABDOMEN:  normoactive bowel sounds , non tender, not distended. No palpable hepatosplenomegaly.  Extremity: 1+ pedal edema PSYCH: alert & oriented x 3 with fluent speech NEURO: no focal  motor/sensory deficits   LABORATORY DATA:  I have reviewed the data as listed  . CBC Latest Ref Rng & Units 08/13/2018 07/11/2018 06/13/2018  WBC 4.0 - 10.5 K/uL 5.0 5.0 5.7  Hemoglobin 13.0 - 17.0 g/dL 10.9(L) 10.0(L) 10.4(L)  Hematocrit 39.0 - 52.0 % 35.2(L) 32.6(L) 34.0(L)  Platelets 150 - 400 K/uL 190 178 176   . CBC    Component Value Date/Time   WBC 5.0 08/13/2018 1020   RBC 3.38 (L) 08/13/2018 1020   HGB 10.9 (L) 08/13/2018 1020   HGB 9.7 (L) 05/31/2018 1050   HCT 35.2 (L) 08/13/2018 1020   HCT 21.9 (L) 07/20/2017 2103   PLT 190 08/13/2018 1020   PLT 231 05/31/2018 1050   MCV 104.1 (H) 08/13/2018 1020   MCH 32.2 08/13/2018 1020   MCHC 31.0 08/13/2018 1020   RDW 15.0 08/13/2018 1020   LYMPHSABS 1.5 08/13/2018 1020   MONOABS 0.5 08/13/2018 1020   EOSABS 0.0 08/13/2018 1020   BASOSABS 0.0 08/13/2018 1020     . CMP Latest Ref Rng & Units 08/13/2018 07/11/2018 06/13/2018  Glucose 70 - 99 mg/dL 101(H) 103(H) 97  BUN 8 - 23 mg/dL 24(H) 25(H) 22  Creatinine 0.61 - 1.24 mg/dL 1.99(H) 2.06(H) 2.14(H)  Sodium 135 - 145 mmol/L 139 136 136  Potassium 3.5 - 5.1 mmol/L 5.5(H) 4.7 4.9  Chloride 98 - 111 mmol/L 109 105 104  CO2 22 - 32 mmol/L 18(L) 22 27  Calcium 8.9 - 10.3 mg/dL 8.0(L) 8.1(L) 8.7(L)  Total Protein 6.5 - 8.1 g/dL 7.2 6.8 7.2  Total Bilirubin 0.3 - 1.2 mg/dL 0.7 0.8 0.9  Alkaline Phos 38 - 126 U/L 118 207(H) 363(H)  AST 15 - 41 U/L '18 17 15  '$ ALT 0 - 44 U/L '8 7 6    '$ Component     Latest Ref Rng & Units 07/30/2017 09/06/2017 10/04/2017 10/31/2017  Prostate Specific Ag, Serum     0.0 - 4.0 ng/mL 1,186.0 (H) 433.8 (H) 67.4 (H) 55.8 (H)   07/31/17 BM Bx:   RADIOGRAPHIC STUDIES: I have personally reviewed the radiological images as listed and agreed with the findings in the report. No results found.  ASSESSMENT & PLAN:   85 y.o. male with  1. Normocytic Anemia - due to primarily metastatic prostatic cancer (ACD + BM involvement with prostate cancer causing  myelopthisic picture)  -Pt presented with a Hgb at 8.7 on 07/22/17, improved to 10.3 after receiving PRBC transfusion. -In review of the patient's previous CBC records, his anemia developed 6-7 months before presenting to care with me, unaccompanied by a drop in his EPO (07/20/17 elevated at 249.8);  LDH elevated  but haptoglobin at 357 on 07/20/17. -suggested against active hemolysis.  2. Recently diagnosed metastatic prostate cancer with extensive bone metastases. With bone mets/BM Mets and LNadenopathy. Exam positive for left retroperitoneal, bilateral pelvis and right inguinal adenopathy. Exam positive for left retroperitoneal, bilateral pelvis and right inguinal adenopathy. Diffuse sclerotic bone metastasis.   3. Elevated alkaline phosphatase due to bone metastases from prostate cancer -CT BM bx -concerning for significant bone lesions in lower spine and pelvis PSA levels have declined to 26.9  4. Cancer related pain - primarily in b/l thighs. -much improved.  PLAN:  -Discussed pt labwork today, 08/13/18; HGB improved to 10.9, Potassium at 5.5. Kidney function stable. Alk Phos normalized. -Last available PSA from 07/11/18 down to 21.4, from 121 prior to beginning [place]. -The pt has no prohibitive toxicities from continuing '120mg'$  Xtandi at this time. Will consider increasing to '160mg'$  in a couple months as pt has responded well but endorses bothersome hot flashes. -Continue Lupron injections every 12 weeks -Discussed that we could begin SSRIs or Gabapentin for the pt's bothersome hot flashes, secondary to treatment. Pt would like to hold off on this for now. -Recommend 400-800 IU Vitamin E, limiting caffeine consumption, staying very well hydrated with room temp water, and staying as active as possible to mitigate hot flashes as well -Continue elevating feet for mild ankle swelling -Continue 0.'4mg'$  Tamsulosin BID -Continue Xgeva every 4 weeks  -Continue on '81mg'$  Aspirin and Flomax  -Continue  Vitamin D and Calcium supplements. -Improved vit D levels with replacement, up to 65.5 on 05/16/18 -Regarding the patient's anemia, I havel set up transfusions prn for hgb<8 -Recommend urinating on a schedule. If currently mild incontinence continues to worsen, will refer the pt to Urology -Pt will consider marinol in the interim, which I advised as an option to treat his anorexia -Have discussed option for port placement  - patient wants to think about this. -Nutritional therapy referral to [facility]  -Recommended that the pt continue to eat well, drink at least 48-64 oz of water each day, and walk 20-30 minutes each day.  -Will see the pt back in 2 months    Continue Xgeva q4weeks  Continue Lupron q12weeks Labs every 4 weeks RTC with Dr [name] in 8 weeks   All of the patients questions were answered with apparent satisfaction. The patient knows to call the clinic with any problems, questions or concerns.  The total time spent in the appt was 30 minutes and more than 50% was on counseling and direct patient cares.   Sullivan Lone MD MS AAHIVMS [facility] Hematology/Oncology Physician [facility]  (Office):       [phone] (Work cell):  [phone] (Fax):           [phone]  08/13/2018 11:30 AM  I, Baldwin [place], am acting as a scribe for Dr. Sullivan Lone.   .I have reviewed the above documentation for accuracy and completeness, and I agree with the above. Brunetta Genera MD

## 2018-08-13 ENCOUNTER — Other Ambulatory Visit: Payer: Self-pay | Admitting: Pharmacist

## 2018-08-13 ENCOUNTER — Ambulatory Visit: Payer: Medicare Other

## 2018-08-13 ENCOUNTER — Inpatient Hospital Stay (HOSPITAL_BASED_OUTPATIENT_CLINIC_OR_DEPARTMENT_OTHER): Payer: Medicare Other | Admitting: Hematology

## 2018-08-13 ENCOUNTER — Inpatient Hospital Stay: Payer: Medicare Other | Attending: Hematology

## 2018-08-13 ENCOUNTER — Other Ambulatory Visit: Payer: Medicare Other

## 2018-08-13 ENCOUNTER — Telehealth: Payer: Self-pay | Admitting: Hematology

## 2018-08-13 ENCOUNTER — Other Ambulatory Visit: Payer: Self-pay

## 2018-08-13 ENCOUNTER — Inpatient Hospital Stay: Payer: Medicare Other

## 2018-08-13 VITALS — BP 164/79 | HR 82 | Temp 98.1°F | Resp 8 | Ht 67.0 in | Wt 172.9 lb

## 2018-08-13 DIAGNOSIS — C7951 Secondary malignant neoplasm of bone: Secondary | ICD-10-CM | POA: Diagnosis not present

## 2018-08-13 DIAGNOSIS — Z79899 Other long term (current) drug therapy: Secondary | ICD-10-CM

## 2018-08-13 DIAGNOSIS — D649 Anemia, unspecified: Secondary | ICD-10-CM | POA: Insufficient documentation

## 2018-08-13 DIAGNOSIS — C61 Malignant neoplasm of prostate: Secondary | ICD-10-CM | POA: Insufficient documentation

## 2018-08-13 DIAGNOSIS — G893 Neoplasm related pain (acute) (chronic): Secondary | ICD-10-CM

## 2018-08-13 DIAGNOSIS — R748 Abnormal levels of other serum enzymes: Secondary | ICD-10-CM

## 2018-08-13 DIAGNOSIS — D6182 Myelophthisis: Secondary | ICD-10-CM

## 2018-08-13 LAB — CBC WITH DIFFERENTIAL/PLATELET
Abs Immature Granulocytes: 0.06 10*3/uL (ref 0.00–0.07)
Basophils Absolute: 0 10*3/uL (ref 0.0–0.1)
Basophils Relative: 0 %
Eosinophils Absolute: 0 10*3/uL (ref 0.0–0.5)
Eosinophils Relative: 1 %
HCT: 35.2 % — ABNORMAL LOW (ref 39.0–52.0)
Hemoglobin: 10.9 g/dL — ABNORMAL LOW (ref 13.0–17.0)
Immature Granulocytes: 1 %
Lymphocytes Relative: 30 %
Lymphs Abs: 1.5 10*3/uL (ref 0.7–4.0)
MCH: 32.2 pg (ref 26.0–34.0)
MCHC: 31 g/dL (ref 30.0–36.0)
MCV: 104.1 fL — ABNORMAL HIGH (ref 80.0–100.0)
Monocytes Absolute: 0.5 10*3/uL (ref 0.1–1.0)
Monocytes Relative: 10 %
Neutro Abs: 2.9 10*3/uL (ref 1.7–7.7)
Neutrophils Relative %: 58 %
Platelets: 190 10*3/uL (ref 150–400)
RBC: 3.38 MIL/uL — ABNORMAL LOW (ref 4.22–5.81)
RDW: 15 % (ref 11.5–15.5)
WBC: 5 10*3/uL (ref 4.0–10.5)
nRBC: 0.6 % — ABNORMAL HIGH (ref 0.0–0.2)

## 2018-08-13 LAB — CMP (CANCER CENTER ONLY)
ALT: 8 U/L (ref 0–44)
AST: 18 U/L (ref 15–41)
Albumin: 3.8 g/dL (ref 3.5–5.0)
Alkaline Phosphatase: 118 U/L (ref 38–126)
Anion gap: 12 (ref 5–15)
BUN: 24 mg/dL — ABNORMAL HIGH (ref 8–23)
CO2: 18 mmol/L — ABNORMAL LOW (ref 22–32)
Calcium: 8 mg/dL — ABNORMAL LOW (ref 8.9–10.3)
Chloride: 109 mmol/L (ref 98–111)
Creatinine: 1.99 mg/dL — ABNORMAL HIGH (ref 0.61–1.24)
GFR, Est AFR Am: 34 mL/min — ABNORMAL LOW (ref >60)
GFR, Estimated: 30 mL/min — ABNORMAL LOW (ref >60)
Glucose, Bld: 101 mg/dL — ABNORMAL HIGH (ref 70–99)
Potassium: 5.5 mmol/L — ABNORMAL HIGH (ref 3.5–5.1)
Sodium: 139 mmol/L (ref 135–145)
Total Bilirubin: 0.7 mg/dL (ref 0.3–1.2)
Total Protein: 7.2 g/dL (ref 6.5–8.1)

## 2018-08-13 LAB — SAMPLE TO BLOOD BANK

## 2018-08-13 MED ORDER — LEUPROLIDE ACETATE (3 MONTH) 22.5 MG IM KIT
PACK | INTRAMUSCULAR | Status: AC
  Filled 2018-08-13: qty 22.5

## 2018-08-13 MED ORDER — DENOSUMAB 120 MG/1.7ML ~~LOC~~ SOLN
SUBCUTANEOUS | Status: AC
  Filled 2018-08-13: qty 1.7

## 2018-08-13 MED ORDER — DENOSUMAB 120 MG/1.7ML ~~LOC~~ SOLN
120.0000 mg | Freq: Once | SUBCUTANEOUS | Status: AC
  Administered 2018-08-13: 12:00:00 120 mg via SUBCUTANEOUS

## 2018-08-13 MED ORDER — LEUPROLIDE ACETATE (3 MONTH) 22.5 MG IM KIT
22.5000 mg | PACK | Freq: Once | INTRAMUSCULAR | Status: AC
  Administered 2018-08-13: 22.5 mg via INTRAMUSCULAR

## 2018-08-13 NOTE — Telephone Encounter (Signed)
Scheduled appt per 4/14 los. °

## 2018-08-13 NOTE — Patient Instructions (Signed)
Leuprolide injection What is this medicine? LEUPROLIDE (loo PROE lide) is a man-made hormone. It is used to treat the symptoms of prostate cancer. This medicine may also be used to treat children with early onset of puberty. It may be used for other hormonal conditions. This medicine may be used for other purposes; ask your health care provider or pharmacist if you have questions. COMMON BRAND NAME(S): Lupron What should I tell my health care provider before I take this medicine? They need to know if you have any of these conditions: -diabetes -heart disease or previous heart attack -high blood pressure -high cholesterol -pain or difficulty passing urine -spinal cord metastasis -stroke -tobacco smoker -an unusual or allergic reaction to leuprolide, benzyl alcohol, other medicines, foods, dyes, or preservatives -pregnant or trying to get pregnant -breast-feeding How should I use this medicine? This medicine is for injection under the skin or into a muscle. You will be taught how to prepare and give this medicine. Use exactly as directed. Take your medicine at regular intervals. Do not take your medicine more often than directed. It is important that you put your used needles and syringes in a special sharps container. Do not put them in a trash can. If you do not have a sharps container, call your pharmacist or healthcare provider to get one. A special MedGuide will be given to you by the pharmacist with each prescription and refill. Be sure to read this information carefully each time. Talk to your pediatrician regarding the use of this medicine in children. While this medicine may be prescribed for children as young as 8 years for selected conditions, precautions do apply. Overdosage: If you think you have taken too much of this medicine contact a poison control center or emergency room at once. NOTE: This medicine is only for you. Do not share this medicine with others. What if I miss a  dose? If you miss a dose, take it as soon as you can. If it is almost time for your next dose, take only that dose. Do not take double or extra doses. What may interact with this medicine? Do not take this medicine with any of the following medications: -chasteberry This medicine may also interact with the following medications: -herbal or dietary supplements, like black cohosh or DHEA -male hormones, like estrogens or progestins and birth control pills, patches, rings, or injections -male hormones, like testosterone This list may not describe all possible interactions. Give your health care provider a list of all the medicines, herbs, non-prescription drugs, or dietary supplements you use. Also tell them if you smoke, drink alcohol, or use illegal drugs. Some items may interact with your medicine. What should I watch for while using this medicine? Visit your doctor or health care professional for regular checks on your progress. During the first week, your symptoms may get worse, but then will improve as you continue your treatment. You may get hot flashes, increased bone pain, increased difficulty passing urine, or an aggravation of nerve symptoms. Discuss these effects with your doctor or health care professional, some of them may improve with continued use of this medicine. Male patients may experience a menstrual cycle or spotting during the first 2 months of therapy with this medicine. If this continues, contact your doctor or health care professional. What side effects may I notice from receiving this medicine? Side effects that you should report to your doctor or health care professional as soon as possible: -allergic reactions like skin rash, itching or  hives, swelling of the face, lips, or tongue -breathing problems -chest pain -depression or memory disorders -pain in your legs or groin -pain at site where injected -severe headache -swelling of the feet and legs -visual  changes -vomiting Side effects that usually do not require medical attention (report to your doctor or health care professional if they continue or are bothersome): -breast swelling or tenderness -decrease in sex drive or performance -diarrhea -hot flashes -loss of appetite -muscle, joint, or bone pains -nausea -redness or irritation at site where injected -skin problems or acne This list may not describe all possible side effects. Call your doctor for medical advice about side effects. You may report side effects to FDA at 1-800-FDA-1088. Where should I keep my medicine? Keep out of the reach of children. Store below 25 degrees C (77 degrees F). Do not freeze. Protect from light. Do not use if it is not clear or if there are particles present. Throw away any unused medicine after the expiration date. NOTE: This sheet is a summary. It may not cover all possible information. If you have questions about this medicine, talk to your doctor, pharmacist, or health care provider.  2019 Elsevier/Gold Standard (2015-12-06 10:54:35) Denosumab injection What is this medicine? DENOSUMAB (den oh sue mab) slows bone breakdown. Prolia is used to treat osteoporosis in women after menopause and in men, and in people who are taking corticosteroids for 6 months or more. Xgeva is used to treat a high calcium level due to cancer and to prevent bone fractures and other bone problems caused by multiple myeloma or cancer bone metastases. Xgeva is also used to treat giant cell tumor of the bone. This medicine may be used for other purposes; ask your health care provider or pharmacist if you have questions. COMMON BRAND NAME(S): Prolia, XGEVA What should I tell my health care provider before I take this medicine? They need to know if you have any of these conditions: -dental disease -having surgery or tooth extraction -infection -kidney disease -low levels of calcium or Vitamin D in the blood -malnutrition -on  hemodialysis -skin conditions or sensitivity -thyroid or parathyroid disease -an unusual reaction to denosumab, other medicines, foods, dyes, or preservatives -pregnant or trying to get pregnant -breast-feeding How should I use this medicine? This medicine is for injection under the skin. It is given by a health care professional in a hospital or clinic setting. A special MedGuide will be given to you before each treatment. Be sure to read this information carefully each time. For Prolia, talk to your pediatrician regarding the use of this medicine in children. Special care may be needed. For Xgeva, talk to your pediatrician regarding the use of this medicine in children. While this drug may be prescribed for children as young as 13 years for selected conditions, precautions do apply. Overdosage: If you think you have taken too much of this medicine contact a poison control center or emergency room at once. NOTE: This medicine is only for you. Do not share this medicine with others. What if I miss a dose? It is important not to miss your dose. Call your doctor or health care professional if you are unable to keep an appointment. What may interact with this medicine? Do not take this medicine with any of the following medications: -other medicines containing denosumab This medicine may also interact with the following medications: -medicines that lower your chance of fighting infection -steroid medicines like prednisone or cortisone This list may not describe all   possible interactions. Give your health care provider a list of all the medicines, herbs, non-prescription drugs, or dietary supplements you use. Also tell them if you smoke, drink alcohol, or use illegal drugs. Some items may interact with your medicine. What should I watch for while using this medicine? Visit your doctor or health care professional for regular checks on your progress. Your doctor or health care professional may order  blood tests and other tests to see how you are doing. Call your doctor or health care professional for advice if you get a fever, chills or sore throat, or other symptoms of a cold or flu. Do not treat yourself. This drug may decrease your body's ability to fight infection. Try to avoid being around people who are sick. You should make sure you get enough calcium and vitamin D while you are taking this medicine, unless your doctor tells you not to. Discuss the foods you eat and the vitamins you take with your health care professional. See your dentist regularly. Brush and floss your teeth as directed. Before you have any dental work done, tell your dentist you are receiving this medicine. Do not become pregnant while taking this medicine or for 5 months after stopping it. Talk with your doctor or health care professional about your birth control options while taking this medicine. Women should inform their doctor if they wish to become pregnant or think they might be pregnant. There is a potential for serious side effects to an unborn child. Talk to your health care professional or pharmacist for more information. What side effects may I notice from receiving this medicine? Side effects that you should report to your doctor or health care professional as soon as possible: -allergic reactions like skin rash, itching or hives, swelling of the face, lips, or tongue -bone pain -breathing problems -dizziness -jaw pain, especially after dental work -redness, blistering, peeling of the skin -signs and symptoms of infection like fever or chills; cough; sore throat; pain or trouble passing urine -signs of low calcium like fast heartbeat, muscle cramps or muscle pain; pain, tingling, numbness in the hands or feet; seizures -unusual bleeding or bruising -unusually weak or tired Side effects that usually do not require medical attention (report to your doctor or health care professional if they continue or are  bothersome): -constipation -diarrhea -headache -joint pain -loss of appetite -muscle pain -runny nose -tiredness -upset stomach This list may not describe all possible side effects. Call your doctor for medical advice about side effects. You may report side effects to FDA at 1-800-FDA-1088. Where should I keep my medicine? This medicine is only given in a clinic, doctor's office, or other health care setting and will not be stored at home. NOTE: This sheet is a summary. It may not cover all possible information. If you have questions about this medicine, talk to your doctor, pharmacist, or health care provider.  2019 Elsevier/Gold Standard (2017-08-24 16:10:44)  

## 2018-08-13 NOTE — Progress Notes (Signed)
Per Dr [name] ok to give [facility] with calcium 8. Also K a little high at 5.5 counseled pt on ;ow potassium diet.

## 2018-08-22 DIAGNOSIS — D6182 Myelophthisis: Secondary | ICD-10-CM | POA: Diagnosis not present

## 2018-08-22 DIAGNOSIS — R269 Unspecified abnormalities of gait and mobility: Secondary | ICD-10-CM | POA: Diagnosis not present

## 2018-08-22 DIAGNOSIS — G893 Neoplasm related pain (acute) (chronic): Secondary | ICD-10-CM | POA: Diagnosis not present

## 2018-08-23 ENCOUNTER — Other Ambulatory Visit: Payer: Self-pay | Admitting: Family Medicine

## 2018-08-29 ENCOUNTER — Other Ambulatory Visit: Payer: Self-pay | Admitting: Hematology

## 2018-08-29 MED ORDER — FENTANYL 25 MCG/HR TD PT72: 1.0000 | MEDICATED_PATCH | TRANSDERMAL | 0 refills | Status: DC

## 2018-08-30 ENCOUNTER — Other Ambulatory Visit: Payer: Self-pay | Admitting: Pharmacist

## 2018-08-30 DIAGNOSIS — C61 Malignant neoplasm of prostate: Secondary | ICD-10-CM

## 2018-08-30 MED ORDER — ENZALUTAMIDE 40 MG PO CAPS: 120.0000 mg | ORAL_CAPSULE | Freq: Every day | ORAL | 1 refills | Status: DC

## 2018-08-30 NOTE — Telephone Encounter (Signed)
Oral Oncology Pharmacist Encounter  Prescription for Xtandi e-scribed to the [facility] outpatient pharmacy to reflect current dosing of 120mg  daily. Quantity #90, refills=1  Johny Drilling, PharmD, BCPS, BCOP  08/30/2018 8:55 AM Oral Oncology Clinic [phone]

## 2018-09-10 ENCOUNTER — Inpatient Hospital Stay: Payer: Medicare Other

## 2018-09-10 ENCOUNTER — Other Ambulatory Visit: Payer: Self-pay

## 2018-09-10 ENCOUNTER — Inpatient Hospital Stay: Payer: Medicare Other | Attending: Hematology

## 2018-09-10 VITALS — BP 156/84 | HR 77 | Temp 98.3°F | Resp 16

## 2018-09-10 DIAGNOSIS — C61 Malignant neoplasm of prostate: Secondary | ICD-10-CM | POA: Diagnosis not present

## 2018-09-10 DIAGNOSIS — Z79899 Other long term (current) drug therapy: Secondary | ICD-10-CM | POA: Diagnosis not present

## 2018-09-10 DIAGNOSIS — C7951 Secondary malignant neoplasm of bone: Secondary | ICD-10-CM

## 2018-09-10 LAB — CBC WITH DIFFERENTIAL/PLATELET
Abs Immature Granulocytes: 0.04 10*3/uL (ref 0.00–0.07)
Basophils Absolute: 0 10*3/uL (ref 0.0–0.1)
Basophils Relative: 1 %
Eosinophils Absolute: 0.1 10*3/uL (ref 0.0–0.5)
Eosinophils Relative: 1 %
HCT: 32.2 % — ABNORMAL LOW (ref 39.0–52.0)
Hemoglobin: 9.8 g/dL — ABNORMAL LOW (ref 13.0–17.0)
Immature Granulocytes: 1 %
Lymphocytes Relative: 36 %
Lymphs Abs: 1.6 10*3/uL (ref 0.7–4.0)
MCH: 32.2 pg (ref 26.0–34.0)
MCHC: 30.4 g/dL (ref 30.0–36.0)
MCV: 105.9 fL — ABNORMAL HIGH (ref 80.0–100.0)
Monocytes Absolute: 0.4 10*3/uL (ref 0.1–1.0)
Monocytes Relative: 10 %
Neutro Abs: 2.2 10*3/uL (ref 1.7–7.7)
Neutrophils Relative %: 51 %
Platelets: 171 10*3/uL (ref 150–400)
RBC: 3.04 MIL/uL — ABNORMAL LOW (ref 4.22–5.81)
RDW: 13.9 % (ref 11.5–15.5)
WBC: 4.3 10*3/uL (ref 4.0–10.5)
nRBC: 0 % (ref 0.0–0.2)

## 2018-09-10 LAB — CMP (CANCER CENTER ONLY)
ALT: 10 U/L (ref 0–44)
AST: 15 U/L (ref 15–41)
Albumin: 3.6 g/dL (ref 3.5–5.0)
Alkaline Phosphatase: 91 U/L (ref 38–126)
Anion gap: 8 (ref 5–15)
BUN: 16 mg/dL (ref 8–23)
CO2: 23 mmol/L (ref 22–32)
Calcium: 7.8 mg/dL — ABNORMAL LOW (ref 8.9–10.3)
Chloride: 108 mmol/L (ref 98–111)
Creatinine: 1.91 mg/dL — ABNORMAL HIGH (ref 0.61–1.24)
GFR, Est AFR Am: 36 mL/min — ABNORMAL LOW (ref >60)
GFR, Estimated: 31 mL/min — ABNORMAL LOW (ref >60)
Glucose, Bld: 102 mg/dL — ABNORMAL HIGH (ref 70–99)
Potassium: 4.7 mmol/L (ref 3.5–5.1)
Sodium: 139 mmol/L (ref 135–145)
Total Bilirubin: 0.9 mg/dL (ref 0.3–1.2)
Total Protein: 6.7 g/dL (ref 6.5–8.1)

## 2018-09-10 LAB — SAMPLE TO BLOOD BANK

## 2018-09-10 MED ORDER — DENOSUMAB 120 MG/1.7ML ~~LOC~~ SOLN
SUBCUTANEOUS | Status: AC
  Filled 2018-09-10: qty 1.7

## 2018-09-10 MED ORDER — DENOSUMAB 120 MG/1.7ML ~~LOC~~ SOLN
120.0000 mg | Freq: Once | SUBCUTANEOUS | Status: AC
  Administered 2018-09-10: 120 mg via SUBCUTANEOUS

## 2018-09-10 NOTE — Progress Notes (Signed)
Per Dr. [name]- ok to receive Xgeva today w/Calcium 7.8

## 2018-09-10 NOTE — Progress Notes (Signed)
Per Georgina Pillion, RN approved to give per the physician. Inform PT to take calcium supplements.

## 2018-09-10 NOTE — Patient Instructions (Signed)

## 2018-09-11 LAB — PROSTATE-SPECIFIC AG, SERUM (LABCORP): Prostate Specific Ag, Serum: 29.3 ng/mL — ABNORMAL HIGH (ref 0.0–4.0)

## 2018-09-12 MED FILL — XTANDI 40 MG CAPSULE: 40 | 30 days supply | Qty: 90 | Fill #0

## 2018-09-18 ENCOUNTER — Telehealth: Payer: Self-pay | Admitting: *Deleted

## 2018-09-18 NOTE — Telephone Encounter (Signed)
Tony [name] with [facility] called to stated -patient has only  66 hours/week of care approved by [facility]. They would like an increase to allow more health aide time with patient. She will fax the order form to office for Dr. Grier Mitts review/signature.

## 2018-09-27 ENCOUNTER — Other Ambulatory Visit: Payer: Self-pay | Admitting: Family Medicine

## 2018-09-27 ENCOUNTER — Other Ambulatory Visit: Payer: Self-pay | Admitting: Hematology

## 2018-09-30 ENCOUNTER — Telehealth: Payer: Self-pay | Admitting: *Deleted

## 2018-09-30 NOTE — Telephone Encounter (Signed)
Late entry for 09/27/2018: Daughter [name] called. Patient needs vit D refilled if he is to continue it. Refill request sent to Dr. [name]. She said his appetite is not great, but someone had donated a case of Boost nutritional drink and they were going to try that. Informed her that the [place] agency had called and asked for increase in hours to 80 a month. Dr. [name] is in agreement, if patient needs the assistance. Per [place], he receives about 3 hours a day in home assistance and that is enough at this time - she did not request an increase and does not want additional hours at this time.

## 2018-10-04 ENCOUNTER — Other Ambulatory Visit: Payer: Self-pay | Admitting: Hematology

## 2018-10-04 MED ORDER — FENTANYL 25 MCG/HR TD PT72: 1.0000 | MEDICATED_PATCH | TRANSDERMAL | 0 refills | Status: DC

## 2018-10-07 NOTE — Progress Notes (Signed)
HEMATOLOGY/ONCOLOGY CLINIC NOTE  Date of Service: 10/08/18    Patient Care Team: Tony Barrack, MD as PCP - General (Family Medicine)  CHIEF COMPLAINTS/PURPOSE OF CONSULTATION:  -recently diagnosed metastatic prostate cancer -Myelopthisic anemia  HISTORY OF PRESENTING ILLNESS:   Tony [name] is a wonderful 85 y.o. male who has been referred to [place] by Dr Tony [name] for evaluation and management of anemia. He is accompanied today by his daughter. The pt reports that he is doing well overall.   The pt reports a new onset of fatigue that began in January 2019. His daughter notes being able to tell a difference in his energy levels as early as November 2018. He notes that some cramping pain in his thighs. He notes that his recent 07/23/17 blood transfusion has led to a "terrific, immediate change" in his thigh pain. However, his thigh pain has returned in the last couple days.  He notes that he takes 1069mg Vitamin B12 daily.   He notes that for 6-7 years he took iron pills after his 2012 heart attack and stroke. He notes that he took a statin for 6-7 months and was taken off of it recently. He notes no unresolved symptoms from his stroke.   The pt notes that over the last 4-5 months his appetite has diminished and he has subsequently lost about 20 lbs in that time.  He notes that he has historically not preferred to drink water as such, but hydrates with juice, coffee, and other drinks.  He notes that he believes that he is able to take care of himself adequately at home. He also notes that his cane is sufficient for helping him get around.   Most recent lab results (07/22/17) of CBC  is as follows: all values are WNL except for RBC at 2.94, Hgb at 8.7, HCT at 27.2, RDW at 26.5, Platelets at 134k. CBC from 10/30/16 revealed all values WNL.  CMP 07/21/17 revealed all values WNL except for Sodium at 134, Glucose at 101, Creatinine at 1.85, Calcium at 7.9, Total Protein at 5.7, Albumin at  2.7, Alk Phos at 433.  LDH 07/20/17 elevated at 256. Haptoglobin 07/20/17 elevated at 357.  Vitamin B12 07/06/17 is WNL at 229.   On review of systems, pt reports decreased appetite, losing 20 lbs over 4-5 months, bilateral thigh pain, fatigue, mild leg swelling, and denies fevers, chills, night sweats, bone pains, nausea, abdominal pains, bleeding, blood in the urine, blood in the stools, black stools, changes in bowel habits, light headednss, dizziness, abdominal pains, noticing any new lumps or bumps, testicular pain or swelling, and any other symptoms.   On PMHx the pt reports heart attack and stroke in 2012. On Social Hx the pt denies much ETOH consumption.   Interval History:  Mr. Tony Schmidreturns today regarding his metastatic prostate cancer and myelopthisic anemia. The patient's last visit with [place] on 08[place]13/18. The pt reports that he is doing well overall. He is accompanied today by his daughter, Tony [name]  The pt reports that he has developed more pain in both of his knees, which hurt only when he walks. He notes that he is a little bit mor mobile than he was previously, but that his knee pain has been limiting. He continues with home health daily and notes that his aide has been helpful in pushing him to get up and walk around.  The pt notes that he had some intermittent "hesitation in the  flow" of his urination, denies discomfort passing urine nor burning, but notes occasional discomfort. Denies blood in the urine. He notes that he needs to drink more water.  The pt notes that he has continued to have some hot flashes and chills after taking '120mg'$  Xtandi. He denies body aches. He notes that he has been eating fair, and has gained 5 pounds in the last 8 weeks.  Lab results today (10/08/18) of CBC w/diff and CMP is as follows: all values are WNL except for WBC at 3.9k, RBC at 3.14, HGB at 10.2, HCT at 33.6, MCV at 107.0, CO2 at 21, Creatinine at 2.03, Calcium at 8.1, GFR  at 33.  On review of systems, pt reports hot flashes, worsened bilateral knee pain, weight gain, and denies burning when urinating, pain when urinating, blood in the urine, body aches, back pains, abdominal pains, leg swelling, and any other symptoms.   MEDICAL HISTORY:  Past Medical History:  Diagnosis Date   BPH associated with nocturia    flomax 0.4--> 0.8 mg trial. nocturia if has coffee. some incontinence   CAD (coronary artery disease)    LAD Stent 2012. 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    Gout    no rx. apparently 1x in past   History of stroke    no aspirin before stroke. no deficits. slurred words at time of stroke   Hyperlipidemia    Hypertension     SURGICAL HISTORY: Past Surgical History:  Procedure Laterality Date   CARDIAC SURGERY     Stint   CORONARY STENT PLACEMENT     left arm fracture s/p surgery     right leg fracture s.p surgery- screws like arm      SOCIAL HISTORY: Social History   Socioeconomic History   Marital status: Married    Spouse name: Not on file   Number of children: Not on file   Years of education: Not on file   Highest education level: Not on file  Occupational History   Not on file  Social Needs   Financial resource strain: Not on file   Food insecurity:    Worry: Not on file    Inability: Not on file   Transportation needs:    Medical: Not on file    Non-medical: Not on file  Tobacco Use   Smoking status: Former Smoker    Packs/day: 0.50    Years: 1.00    Pack years: 0.50    Types: Cigarettes    Last attempt to quit: 05/01/1952    Years since quitting: 66.4   Smokeless tobacco: Never Used  Substance and Sexual Activity   Alcohol use: No   Drug use: No   Sexual activity: Not on file  Lifestyle   Physical activity:    Days per week: Not on file    Minutes per session: Not on file   Stress: Not on file  Relationships   Social connections:    Talks on phone: Not on  file    Gets together: Not on file    Attends religious service: Not on file    Active member of club or organization: Not on file    Attends meetings of clubs or organizations: Not on file    Relationship status: Not on file   Intimate partner violence:    Fear of current or ex partner: Not on file    Emotionally abused: Not on file    Physically abused:  Not on file    Forced sexual activity: Not on file  Other Topics Concern   Not on file  Social History Narrative   Married- lives separate from wife. 2 children. Daughter passed from heart attack.       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  MEDICATIONS:  Current Outpatient Medications  Medication Sig Dispense Refill   aspirin EC 81 MG tablet Take 1 tablet (81 mg total) by mouth daily. 90 tablet 3   BIDIL 20-37.5 MG tablet TAKE 1 TABLET BY MOUTH TWICE DAILY 180 tablet 0   calcium-vitamin D (OSCAL 500/200 D-3) 500-200 MG-UNIT tablet Take 1 tablet by mouth daily with breakfast. 30 tablet 2   enzalutamide (XTANDI) 40 MG capsule Take 3 capsules (120 mg total) by mouth daily. 90 capsule 1   fentaNYL (DURAGESIC) 25 MCG/HR Place 1 patch onto the skin every 3 (three) days. 10 patch 0   metoprolol succinate (TOPROL-XL) 25 MG 24 hr tablet TAKE 1 TABLET BY MOUTH EVERY DAY 90 tablet 0   Multiple Vitamins-Minerals (MULTI COMPLETE PO) Take by mouth.     nitroGLYCERIN (NITROSTAT) 0.4 MG SL tablet Place 1 tablet (0.4 mg total) under the tongue every 5 (five) minutes as needed for chest pain (3 maximum before seeking care). 30 tablet 0   Omega-3 1000 MG CAPS Take 1 capsule by mouth daily.      oxyCODONE (OXY IR/ROXICODONE) 5 MG immediate release tablet Take 1 tablet (5 mg  total) by mouth every 4 (four) hours as needed for severe pain. 90 tablet 0   polyethylene glycol (MIRALAX) packet Take 17 g by mouth daily. 30 each 1   senna-docusate (SENNA S) 8.6-50 MG tablet Take 2 tablets by mouth 2 (two) times daily. 120 tablet 2   tamsulosin (FLOMAX) 0.4 MG CAPS capsule TAKE ONE CAPSULE BY MOUTH TWICE DAILY 180 capsule 3   vitamin B-12 (CYANOCOBALAMIN) 1000 MCG tablet Take 1 tablet (1,000 mcg total) by mouth daily. 30 tablet 0   Vitamin D, Ergocalciferol, (DRISDOL) 1.25 MG (50000 UT) CAPS capsule TAKE 1 CAPSULE BY MOUTH 2 TIMES A WEEK 24 capsule 3   No current facility-administered medications for this visit.     REVIEW OF SYSTEMS:    A 10+ POINT REVIEW OF SYSTEMS WAS OBTAINED including neurology, dermatology, psychiatry, cardiac, respiratory, lymph, extremities, GI, GU, Musculoskeletal, constitutional, breasts, reproductive, HEENT.  All pertinent positives are noted in the HPI.  All others are negative.   PHYSICAL EXAMINATION:  Vitals:   10/08/18 0913  BP: (!) 145/64  Pulse: 83  Resp: 18  Temp: 98.7 F (37.1 C)  SpO2: 100%   Filed Weights   10/08/18 0913  Weight: 177 lb (80.3 kg)   .Body mass index is 27.72 kg/m.  GENERAL:alert, in no acute distress and comfortable SKIN: no acute rashes, no significant lesions EYES: conjunctiva are pink and non-injected, sclera anicteric OROPHARYNX: MMM, no exudates, no oropharyngeal erythema or ulceration NECK: supple, no JVD LYMPH:  no palpable lymphadenopathy in the cervical, axillary or inguinal regions LUNGS: clear to auscultation b/l with normal respiratory effort HEART: regular rate & rhythm ABDOMEN:  normoactive bowel sounds ,  non tender, not distended. No palpable hepatosplenomegaly.  Extremity: no pedal edema PSYCH: alert & oriented x 3 with fluent speech NEURO: no focal motor/sensory deficits   LABORATORY DATA:  I have reviewed the data as listed  . CBC Latest Ref Rng & Units 10/08/2018 09/10/2018  08/13/2018  WBC 4.0 - 10.5 K/uL 3.9(L) 4.3 5.0  Hemoglobin 13.0 - 17.0 g/dL 10.2(L) 9.8(L) 10.9(L)  Hematocrit 39.0 - 52.0 % 33.6(L) 32.2(L) 35.2(L)  Platelets 150 - 400 K/uL 196 171 190   . CBC    Component Value Date/Time   WBC 3.9 (L) 10/08/2018 0824   RBC 3.14 (L) 10/08/2018 0824   HGB 10.2 (L) 10/08/2018 0824   HGB 9.7 (L) 05/31/2018 1050   HCT 33.6 (L) 10/08/2018 0824   HCT 21.9 (L) 07/20/2017 2103   PLT 196 10/08/2018 0824   PLT 231 05/31/2018 1050   MCV 107.0 (H) 10/08/2018 0824   MCH 32.5 10/08/2018 0824   MCHC 30.4 10/08/2018 0824   RDW 13.4 10/08/2018 0824   LYMPHSABS 1.3 10/08/2018 0824   MONOABS 0.4 10/08/2018 0824   EOSABS 0.1 10/08/2018 0824   BASOSABS 0.0 10/08/2018 0824     . CMP Latest Ref Rng & Units 10/08/2018 09/10/2018 08/13/2018  Glucose 70 - 99 mg/dL 93 102(H) 101(H)  BUN 8 - 23 mg/dL 20 16 24(H)  Creatinine 0.61 - 1.24 mg/dL 2.03(H) 1.91(H) 1.99(H)  Sodium 135 - 145 mmol/L 139 139 139  Potassium 3.5 - 5.1 mmol/L 4.5 4.7 5.5(H)  Chloride 98 - 111 mmol/L 109 108 109  CO2 22 - 32 mmol/L 21(L) 23 18(L)  Calcium 8.9 - 10.3 mg/dL 8.1(L) 7.8(L) 8.0(L)  Total Protein 6.5 - 8.1 g/dL 7.1 6.7 7.2  Total Bilirubin 0.3 - 1.2 mg/dL 0.7 0.9 0.7  Alkaline Phos 38 - 126 U/L 87 91 118  AST 15 - 41 U/L '15 15 18  '$ ALT 0 - 44 U/L '7 10 8    '$ Component     Latest Ref Rng & Units 07/30/2017 09/06/2017 10/04/2017 10/31/2017  Prostate Specific Ag, Serum     0.0 - 4.0 ng/mL 1,186.0 (H) 433.8 (H) 67.4 (H) 55.8 (H)   07/31/17 BM Bx:   RADIOGRAPHIC STUDIES: I have personally reviewed the radiological images as listed and agreed with the findings in the report. No results found.  ASSESSMENT & PLAN:   85 y.o. male with  1. Normocytic Anemia - due to primarily metastatic prostatic cancer (ACD + BM involvement with prostate cancer causing myelopthisic picture)  -Pt presented with a Hgb at 8.7 on 07/22/17, improved to 10.3 after receiving PRBC transfusion. -In review of the  patient's previous CBC records, his anemia developed 6-7 months before presenting to care with me, unaccompanied by a drop in his EPO (07/20/17 elevated at 249.8);  LDH elevated  but haptoglobin at 357 on 07/20/17. -suggested against active hemolysis.  2. Recently diagnosed metastatic prostate cancer with extensive bone metastases. With bone mets/BM Mets and LNadenopathy. Exam positive for left retroperitoneal, bilateral pelvis and right inguinal adenopathy. Exam positive for left retroperitoneal, bilateral pelvis and right inguinal adenopathy. Diffuse sclerotic bone metastasis.   3. Elevated alkaline phosphatase due to bone metastases from prostate cancer -CT BM bx -concerning for significant bone lesions in lower spine and pelvis PSA levels have declined to 26.9  4. Cancer related pain - primarily in b/l thighs. -much improved.  PLAN:  -Discussed pt labwork today, 10/08/18; HGB improved to 10.2, other blood counts and chemistries are stable -09/10/18  PSA at 29.3, decreased from 121 prior to beginning [place]. -The pt has no prohibitive toxicities from continuing '120mg'$  Xtandi at this time. Not dose escalating to '160mg'$  for now given bothersome hot flashes. -Continue Lupron injections every 12 weeks -Knees without focal signs of inflammation nor tenderness to palpation on physical exam -Feel that knee pain is due to combination of factors including arthritis, bone changes due to previous tumor involvement and muscle conditioning -Recommend taking Oxycodone prior to working out / being active, to allow for beneficial exercise which will encourage muscle strengthening and conditioning -Discussed that we could begin SSRIs or Gabapentin for the pt's bothersome hot flashes, secondary to treatment. Pt would like to hold off on this for now. -Recommend 400-800 IU Vitamin E, limiting caffeine consumption, staying very well hydrated with room temp water, and staying as active as possible to mitigate hot  flashes as well -Continue elevating feet for mild ankle swelling -Continue 0.'4mg'$  Tamsulosin BID -Continue Xgeva every 4 weeks  -Continue on '81mg'$  Aspirin -Continue Vitamin D and Calcium supplements. -Improved vit D levels with replacement, up to 65.5 on 05/16/18 -Regarding the patient's anemia, I havel set up transfusions prn for hgb<8 -Recommend urinating on a schedule. If currently mild incontinence continues to worsen, will refer the pt to Urology -Pt will consider marinol in the interim, which I advised as an option to treat his anorexia -Have discussed option for port placement  - patient wants to think about this. -Nutritional therapy referral to [facility]  -Recommended that the pt continue to eat well, drink at least 48-64 oz of water each day, and walk 20-30 minutes each day.  -Will see the pt back on 12/03/18   F/u as per currently scheduled appointments in July and Aug 2020   All of the patients questions were answered with apparent satisfaction. The patient knows to call the clinic with any problems, questions or concerns.  The total time spent in the appt was 30 minutes and more than 50% was on counseling and direct patient cares.   Sullivan Lone MD MS AAHIVMS [facility] Hematology/Oncology Physician [facility]  (Office):       [phone] (Work cell):  [phone] (Fax):           [phone]  10/08/2018 10:02 AM  I, Baldwin Jamaica, am acting as a scribe for Dr. Sullivan Lone.   .I have reviewed the above documentation for accuracy and completeness, and I agree with the above. Brunetta Genera MD

## 2018-10-08 ENCOUNTER — Inpatient Hospital Stay: Payer: Medicare Other

## 2018-10-08 ENCOUNTER — Inpatient Hospital Stay: Payer: Medicare Other | Attending: Hematology | Admitting: Hematology

## 2018-10-08 ENCOUNTER — Other Ambulatory Visit: Payer: Medicare Other

## 2018-10-08 ENCOUNTER — Other Ambulatory Visit: Payer: Self-pay

## 2018-10-08 ENCOUNTER — Ambulatory Visit: Payer: Medicare Other

## 2018-10-08 ENCOUNTER — Telehealth: Payer: Self-pay | Admitting: Hematology

## 2018-10-08 VITALS — BP 145/64 | HR 83 | Temp 98.7°F | Resp 18 | Ht 67.0 in | Wt 177.0 lb

## 2018-10-08 DIAGNOSIS — C61 Malignant neoplasm of prostate: Secondary | ICD-10-CM

## 2018-10-08 DIAGNOSIS — C7951 Secondary malignant neoplasm of bone: Secondary | ICD-10-CM | POA: Diagnosis not present

## 2018-10-08 LAB — CMP (CANCER CENTER ONLY)
ALT: 7 U/L (ref 0–44)
AST: 15 U/L (ref 15–41)
Albumin: 3.8 g/dL (ref 3.5–5.0)
Alkaline Phosphatase: 87 U/L (ref 38–126)
Anion gap: 9 (ref 5–15)
BUN: 20 mg/dL (ref 8–23)
CO2: 21 mmol/L — ABNORMAL LOW (ref 22–32)
Calcium: 8.1 mg/dL — ABNORMAL LOW (ref 8.9–10.3)
Chloride: 109 mmol/L (ref 98–111)
Creatinine: 2.03 mg/dL — ABNORMAL HIGH (ref 0.61–1.24)
GFR, Est AFR Am: 33 mL/min — ABNORMAL LOW (ref >60)
GFR, Estimated: 29 mL/min — ABNORMAL LOW (ref >60)
Glucose, Bld: 93 mg/dL (ref 70–99)
Potassium: 4.5 mmol/L (ref 3.5–5.1)
Sodium: 139 mmol/L (ref 135–145)
Total Bilirubin: 0.7 mg/dL (ref 0.3–1.2)
Total Protein: 7.1 g/dL (ref 6.5–8.1)

## 2018-10-08 LAB — CBC WITH DIFFERENTIAL/PLATELET
Abs Immature Granulocytes: 0.03 10*3/uL (ref 0.00–0.07)
Basophils Absolute: 0 10*3/uL (ref 0.0–0.1)
Basophils Relative: 1 %
Eosinophils Absolute: 0.1 10*3/uL (ref 0.0–0.5)
Eosinophils Relative: 1 %
HCT: 33.6 % — ABNORMAL LOW (ref 39.0–52.0)
Hemoglobin: 10.2 g/dL — ABNORMAL LOW (ref 13.0–17.0)
Immature Granulocytes: 1 %
Lymphocytes Relative: 33 %
Lymphs Abs: 1.3 10*3/uL (ref 0.7–4.0)
MCH: 32.5 pg (ref 26.0–34.0)
MCHC: 30.4 g/dL (ref 30.0–36.0)
MCV: 107 fL — ABNORMAL HIGH (ref 80.0–100.0)
Monocytes Absolute: 0.4 10*3/uL (ref 0.1–1.0)
Monocytes Relative: 10 %
Neutro Abs: 2.1 10*3/uL (ref 1.7–7.7)
Neutrophils Relative %: 54 %
Platelets: 196 10*3/uL (ref 150–400)
RBC: 3.14 MIL/uL — ABNORMAL LOW (ref 4.22–5.81)
RDW: 13.4 % (ref 11.5–15.5)
WBC: 3.9 10*3/uL — ABNORMAL LOW (ref 4.0–10.5)
nRBC: 0 % (ref 0.0–0.2)

## 2018-10-08 LAB — SAMPLE TO BLOOD BANK

## 2018-10-08 MED ORDER — DENOSUMAB 120 MG/1.7ML ~~LOC~~ SOLN
120.0000 mg | Freq: Once | SUBCUTANEOUS | Status: AC
  Administered 2018-10-08: 120 mg via SUBCUTANEOUS

## 2018-10-08 MED ORDER — DENOSUMAB 120 MG/1.7ML ~~LOC~~ SOLN
SUBCUTANEOUS | Status: AC
  Filled 2018-10-08: qty 1.7

## 2018-10-08 NOTE — Telephone Encounter (Signed)
Per 6/9 los F/u as per currently scheduled appointments in July and Aug 2020

## 2018-10-09 MED FILL — XTANDI 40 MG CAPSULE: 40 | 30 days supply | Qty: 90 | Fill #1

## 2018-11-04 ENCOUNTER — Other Ambulatory Visit: Payer: Self-pay | Admitting: *Deleted

## 2018-11-04 NOTE — Telephone Encounter (Signed)
Daughter [name] called. Requested refill of Fentanyl.

## 2018-11-05 ENCOUNTER — Telehealth: Payer: Self-pay | Admitting: *Deleted

## 2018-11-05 ENCOUNTER — Ambulatory Visit: Payer: Medicare Other

## 2018-11-05 ENCOUNTER — Inpatient Hospital Stay: Payer: Medicare Other

## 2018-11-05 ENCOUNTER — Inpatient Hospital Stay: Payer: Medicare Other | Attending: Hematology

## 2018-11-05 ENCOUNTER — Other Ambulatory Visit: Payer: Self-pay

## 2018-11-05 ENCOUNTER — Other Ambulatory Visit: Payer: Self-pay | Admitting: Hematology

## 2018-11-05 DIAGNOSIS — Z8673 Personal history of transient ischemic attack (TIA), and cerebral infarction without residual deficits: Secondary | ICD-10-CM | POA: Insufficient documentation

## 2018-11-05 DIAGNOSIS — Z803 Family history of malignant neoplasm of breast: Secondary | ICD-10-CM | POA: Insufficient documentation

## 2018-11-05 DIAGNOSIS — Z87891 Personal history of nicotine dependence: Secondary | ICD-10-CM | POA: Insufficient documentation

## 2018-11-05 DIAGNOSIS — I10 Essential (primary) hypertension: Secondary | ICD-10-CM | POA: Insufficient documentation

## 2018-11-05 DIAGNOSIS — Z992 Dependence on renal dialysis: Secondary | ICD-10-CM | POA: Diagnosis not present

## 2018-11-05 DIAGNOSIS — E785 Hyperlipidemia, unspecified: Secondary | ICD-10-CM | POA: Insufficient documentation

## 2018-11-05 DIAGNOSIS — C7951 Secondary malignant neoplasm of bone: Secondary | ICD-10-CM | POA: Insufficient documentation

## 2018-11-05 DIAGNOSIS — C61 Malignant neoplasm of prostate: Secondary | ICD-10-CM | POA: Diagnosis not present

## 2018-11-05 LAB — CMP (CANCER CENTER ONLY)
ALT: 7 U/L (ref 0–44)
AST: 17 U/L (ref 15–41)
Albumin: 3.7 g/dL (ref 3.5–5.0)
Alkaline Phosphatase: 93 U/L (ref 38–126)
Anion gap: 8 (ref 5–15)
BUN: 20 mg/dL (ref 8–23)
CO2: 21 mmol/L — ABNORMAL LOW (ref 22–32)
Calcium: 8.1 mg/dL — ABNORMAL LOW (ref 8.9–10.3)
Chloride: 108 mmol/L (ref 98–111)
Creatinine: 1.83 mg/dL — ABNORMAL HIGH (ref 0.61–1.24)
GFR, Est AFR Am: 38 mL/min — ABNORMAL LOW (ref >60)
GFR, Estimated: 33 mL/min — ABNORMAL LOW (ref >60)
Glucose, Bld: 104 mg/dL — ABNORMAL HIGH (ref 70–99)
Potassium: 4.7 mmol/L (ref 3.5–5.1)
Sodium: 137 mmol/L (ref 135–145)
Total Bilirubin: 0.9 mg/dL (ref 0.3–1.2)
Total Protein: 7.1 g/dL (ref 6.5–8.1)

## 2018-11-05 LAB — CBC WITH DIFFERENTIAL/PLATELET
Abs Immature Granulocytes: 0.04 10*3/uL (ref 0.00–0.07)
Basophils Absolute: 0 10*3/uL (ref 0.0–0.1)
Basophils Relative: 0 %
Eosinophils Absolute: 0.1 10*3/uL (ref 0.0–0.5)
Eosinophils Relative: 1 %
HCT: 34.1 % — ABNORMAL LOW (ref 39.0–52.0)
Hemoglobin: 10.7 g/dL — ABNORMAL LOW (ref 13.0–17.0)
Immature Granulocytes: 1 %
Lymphocytes Relative: 40 %
Lymphs Abs: 1.9 10*3/uL (ref 0.7–4.0)
MCH: 32.1 pg (ref 26.0–34.0)
MCHC: 31.4 g/dL (ref 30.0–36.0)
MCV: 102.4 fL — ABNORMAL HIGH (ref 80.0–100.0)
Monocytes Absolute: 0.5 10*3/uL (ref 0.1–1.0)
Monocytes Relative: 11 %
Neutro Abs: 2.2 10*3/uL (ref 1.7–7.7)
Neutrophils Relative %: 47 %
Platelets: 196 10*3/uL (ref 150–400)
RBC: 3.33 MIL/uL — ABNORMAL LOW (ref 4.22–5.81)
RDW: 13.1 % (ref 11.5–15.5)
WBC: 4.6 10*3/uL (ref 4.0–10.5)
nRBC: 0 % (ref 0.0–0.2)

## 2018-11-05 MED ORDER — LEUPROLIDE ACETATE (3 MONTH) 22.5 MG IM KIT
PACK | INTRAMUSCULAR | Status: AC
  Filled 2018-11-05: qty 22.5

## 2018-11-05 MED ORDER — FENTANYL 25 MCG/HR TD PT72: 1.0000 | MEDICATED_PATCH | TRANSDERMAL | 0 refills | Status: DC

## 2018-11-05 MED ORDER — LEUPROLIDE ACETATE (3 MONTH) 22.5 MG IM KIT
22.5000 mg | PACK | Freq: Once | INTRAMUSCULAR | Status: AC
  Administered 2018-11-05: 22.5 mg via INTRAMUSCULAR

## 2018-11-05 MED ORDER — DENOSUMAB 120 MG/1.7ML ~~LOC~~ SOLN
120.0000 mg | Freq: Once | SUBCUTANEOUS | Status: AC
  Administered 2018-11-05: 120 mg via SUBCUTANEOUS

## 2018-11-05 MED ORDER — DENOSUMAB 120 MG/1.7ML ~~LOC~~ SOLN
SUBCUTANEOUS | Status: AC
  Filled 2018-11-05: qty 1.7

## 2018-11-05 NOTE — Patient Instructions (Signed)
Denosumab injection What is this medicine? DENOSUMAB (den oh sue mab) slows bone breakdown. Prolia is used to treat osteoporosis in women after menopause and in men, and in people who are taking corticosteroids for 6 months or more. Xgeva is used to treat a high calcium level due to cancer and to prevent bone fractures and other bone problems caused by multiple myeloma or cancer bone metastases. Xgeva is also used to treat giant cell tumor of the bone. This medicine may be used for other purposes; ask your health care provider or pharmacist if you have questions. COMMON BRAND NAME(S): Prolia, XGEVA What should I tell my health care provider before I take this medicine? They need to know if you have any of these conditions:  dental disease  having surgery or tooth extraction  infection  kidney disease  low levels of calcium or Vitamin D in the blood  malnutrition  on hemodialysis  skin conditions or sensitivity  thyroid or parathyroid disease  an unusual reaction to denosumab, other medicines, foods, dyes, or preservatives  pregnant or trying to get pregnant  breast-feeding How should I use this medicine? This medicine is for injection under the skin. It is given by a health care professional in a hospital or clinic setting. A special MedGuide will be given to you before each treatment. Be sure to read this information carefully each time. For Prolia, talk to your pediatrician regarding the use of this medicine in children. Special care may be needed. For Xgeva, talk to your pediatrician regarding the use of this medicine in children. While this drug may be prescribed for children as young as 13 years for selected conditions, precautions do apply. Overdosage: If you think you have taken too much of this medicine contact a poison control center or emergency room at once. NOTE: This medicine is only for you. Do not share this medicine with others. What if I miss a dose? It is  important not to miss your dose. Call your doctor or health care professional if you are unable to keep an appointment. What may interact with this medicine? Do not take this medicine with any of the following medications:  other medicines containing denosumab This medicine may also interact with the following medications:  medicines that lower your chance of fighting infection  steroid medicines like prednisone or cortisone This list may not describe all possible interactions. Give your health care provider a list of all the medicines, herbs, non-prescription drugs, or dietary supplements you use. Also tell them if you smoke, drink alcohol, or use illegal drugs. Some items may interact with your medicine. What should I watch for while using this medicine? Visit your doctor or health care professional for regular checks on your progress. Your doctor or health care professional may order blood tests and other tests to see how you are doing. Call your doctor or health care professional for advice if you get a fever, chills or sore throat, or other symptoms of a cold or flu. Do not treat yourself. This drug may decrease your body's ability to fight infection. Try to avoid being around people who are sick. You should make sure you get enough calcium and vitamin D while you are taking this medicine, unless your doctor tells you not to. Discuss the foods you eat and the vitamins you take with your health care professional. See your dentist regularly. Brush and floss your teeth as directed. Before you have any dental work done, tell your dentist you are   receiving this medicine. Do not become pregnant while taking this medicine or for 5 months after stopping it. Talk with your doctor or health care professional about your birth control options while taking this medicine. Women should inform their doctor if they wish to become pregnant or think they might be pregnant. There is a potential for serious side  effects to an unborn child. Talk to your health care professional or pharmacist for more information. What side effects may I notice from receiving this medicine? Side effects that you should report to your doctor or health care professional as soon as possible:  allergic reactions like skin rash, itching or hives, swelling of the face, lips, or tongue  bone pain  breathing problems  dizziness  jaw pain, especially after dental work  redness, blistering, peeling of the skin  signs and symptoms of infection like fever or chills; cough; sore throat; pain or trouble passing urine  signs of low calcium like fast heartbeat, muscle cramps or muscle pain; pain, tingling, numbness in the hands or feet; seizures  unusual bleeding or bruising  unusually weak or tired Side effects that usually do not require medical attention (report to your doctor or health care professional if they continue or are bothersome):  constipation  diarrhea  headache  joint pain  loss of appetite  muscle pain  runny nose  tiredness  upset stomach This list may not describe all possible side effects. Call your doctor for medical advice about side effects. You may report side effects to FDA at 1-800-FDA-1088. Where should I keep my medicine? This medicine is only given in a clinic, doctor's office, or other health care setting and will not be stored at home. NOTE: This sheet is a summary. It may not cover all possible information. If you have questions about this medicine, talk to your doctor, pharmacist, or health care provider.  2020 Elsevier/Gold Standard (2017-08-24 16:10:44) Leuprolide injection What is this medicine? LEUPROLIDE (loo PROE lide) is a man-made hormone. It is used to treat the symptoms of prostate cancer. This medicine may also be used to treat children with early onset of puberty. It may be used for other hormonal conditions. This medicine may be used for other purposes; ask your  health care provider or pharmacist if you have questions. COMMON BRAND NAME(S): Lupron What should I tell my health care provider before I take this medicine? They need to know if you have any of these conditions:  diabetes  heart disease or previous heart attack  high blood pressure  high cholesterol  pain or difficulty passing urine  spinal cord metastasis  stroke  tobacco smoker  an unusual or allergic reaction to leuprolide, benzyl alcohol, other medicines, foods, dyes, or preservatives  pregnant or trying to get pregnant  breast-feeding How should I use this medicine? This medicine is for injection under the skin or into a muscle. You will be taught how to prepare and give this medicine. Use exactly as directed. Take your medicine at regular intervals. Do not take your medicine more often than directed. It is important that you put your used needles and syringes in a special sharps container. Do not put them in a trash can. If you do not have a sharps container, call your pharmacist or healthcare provider to get one. A special MedGuide will be given to you by the pharmacist with each prescription and refill. Be sure to read this information carefully each time. Talk to your pediatrician regarding the use of  this medicine in children. While this medicine may be prescribed for children as young as 8 years for selected conditions, precautions do apply. Overdosage: If you think you have taken too much of this medicine contact a poison control center or emergency room at once. NOTE: This medicine is only for you. Do not share this medicine with others. What if I miss a dose? If you miss a dose, take it as soon as you can. If it is almost time for your next dose, take only that dose. Do not take double or extra doses. What may interact with this medicine? Do not take this medicine with any of the following medications:  chasteberry This medicine may also interact with the  following medications:  herbal or dietary supplements, like black cohosh or DHEA  male hormones, like estrogens or progestins and birth control pills, patches, rings, or injections  male hormones, like testosterone This list may not describe all possible interactions. Give your health care provider a list of all the medicines, herbs, non-prescription drugs, or dietary supplements you use. Also tell them if you smoke, drink alcohol, or use illegal drugs. Some items may interact with your medicine. What should I watch for while using this medicine? Visit your doctor or health care professional for regular checks on your progress. During the first week, your symptoms may get worse, but then will improve as you continue your treatment. You may get hot flashes, increased bone pain, increased difficulty passing urine, or an aggravation of nerve symptoms. Discuss these effects with your doctor or health care professional, some of them may improve with continued use of this medicine. Male patients may experience a menstrual cycle or spotting during the first 2 months of therapy with this medicine. If this continues, contact your doctor or health care professional. This medicine may increase blood sugar. Ask your healthcare provider if changes in diet or medicines are needed if you have diabetes. What side effects may I notice from receiving this medicine? Side effects that you should report to your doctor or health care professional as soon as possible:  allergic reactions like skin rash, itching or hives, swelling of the face, lips, or tongue  breathing problems  chest pain  depression or memory disorders  pain in your legs or groin  pain at site where injected  severe headache  signs and symptoms of high blood sugar such as being more thirsty or hungry or having to urinate more than normal. You may also feel very tired or have blurry vision  swelling of the feet and legs  visual  changes  vomiting Side effects that usually do not require medical attention (report to your doctor or health care professional if they continue or are bothersome):  breast swelling or tenderness  decrease in sex drive or performance  diarrhea  hot flashes  loss of appetite  muscle, joint, or bone pains  nausea  redness or irritation at site where injected  skin problems or acne This list may not describe all possible side effects. Call your doctor for medical advice about side effects. You may report side effects to FDA at 1-800-FDA-1088. Where should I keep my medicine? Keep out of the reach of children. Store below 25 degrees C (77 degrees F). Do not freeze. Protect from light. Do not use if it is not clear or if there are particles present. Throw away any unused medicine after the expiration date. NOTE: This sheet is a summary. It may not cover all   possible information. If you have questions about this medicine, talk to your doctor, pharmacist, or health care provider.  2020 Elsevier/Gold Standard (2018-02-14 09:52:48)

## 2018-11-05 NOTE — Telephone Encounter (Signed)
"  Kassie Mends Kleen's daughter [name] [phone]).  My call Friday was transferred to nurse voicemail.  Message left requesting Dad's Fentanyl patch to change Sunday.  I did not know you all were closed.  He is 85 yrs old.  I have no way of knowing he needs a refill until he calls me to call the office and request the refill.  This type of medication cannot be filled by the pharmacy until prescription time has ended.  I do not know if he removed the patch Sunday.  Does not need breakthrough medication refill."  Previously shared office closed with [facility] call coverage for Friday's Fourth of July Holiday closing.  Facsimile communications normally received next operating business day.  1:44 pm Provider notified of request, order sent.  1:47 pm Reached daughter [name].  Notified a 30- day refill has been sent asking she contact [facility] with refill request Friday, July 31st or Monday, August 3rd allowing processing time to complete by date needed.

## 2018-11-05 NOTE — Progress Notes (Signed)
Spoke with Dr [name] about PT calcium levels he stated that the 8.1 was fine for him to receive the injection today.

## 2018-11-06 ENCOUNTER — Other Ambulatory Visit: Payer: Self-pay | Admitting: Hematology

## 2018-11-06 DIAGNOSIS — C61 Malignant neoplasm of prostate: Secondary | ICD-10-CM

## 2018-11-07 MED FILL — XTANDI 40 MG CAPSULE: 40 | 30 days supply | Qty: 90 | Fill #0

## 2018-11-15 ENCOUNTER — Other Ambulatory Visit: Payer: Self-pay

## 2018-11-15 ENCOUNTER — Inpatient Hospital Stay (HOSPITAL_BASED_OUTPATIENT_CLINIC_OR_DEPARTMENT_OTHER): Payer: Medicare Other | Admitting: Medical

## 2018-11-15 ENCOUNTER — Telehealth: Payer: Self-pay | Admitting: *Deleted

## 2018-11-15 ENCOUNTER — Ambulatory Visit: Payer: Medicare Other | Admitting: Family Medicine

## 2018-11-15 VITALS — BP 167/77 | HR 83 | Temp 99.8°F | Resp 18 | Ht 67.0 in | Wt 172.7 lb

## 2018-11-15 DIAGNOSIS — Z8673 Personal history of transient ischemic attack (TIA), and cerebral infarction without residual deficits: Secondary | ICD-10-CM | POA: Diagnosis not present

## 2018-11-15 DIAGNOSIS — M7989 Other specified soft tissue disorders: Secondary | ICD-10-CM

## 2018-11-15 DIAGNOSIS — C7951 Secondary malignant neoplasm of bone: Secondary | ICD-10-CM | POA: Diagnosis not present

## 2018-11-15 DIAGNOSIS — C61 Malignant neoplasm of prostate: Secondary | ICD-10-CM

## 2018-11-15 DIAGNOSIS — Z992 Dependence on renal dialysis: Secondary | ICD-10-CM

## 2018-11-15 DIAGNOSIS — Z87891 Personal history of nicotine dependence: Secondary | ICD-10-CM

## 2018-11-15 DIAGNOSIS — E785 Hyperlipidemia, unspecified: Secondary | ICD-10-CM

## 2018-11-15 DIAGNOSIS — Z803 Family history of malignant neoplasm of breast: Secondary | ICD-10-CM

## 2018-11-15 DIAGNOSIS — I10 Essential (primary) hypertension: Secondary | ICD-10-CM

## 2018-11-15 MED ORDER — PREDNISONE 5 MG PO TABS: ORAL_TABLET | ORAL | 0 refills | Status: DC

## 2018-11-15 NOTE — Telephone Encounter (Signed)
"  Tony [name]'s daughter Tobie Poet [phone]).  Wondering if my dad is having a side effect of the injection (Lupron) received last week?  I'd like him to see Lucianne Lei.  At his home now since he called saying his left hand feels weak. He didn't know but I noticed the back of his left hand and fingers are swollen.  Must have happened overnight.  Hand does not look a different color, feel warm, cuts, abrasions or any rash.  No injuries to the hand it's just swollen.  No trouble using his hand, just says it feels 'funny and weak'.  No changes or problems talking or walking."

## 2018-11-15 NOTE — Progress Notes (Signed)
Pt presents today with 'feeling different' in his L hand.  Denies recent injury.  No swelling or injury or redness present.  L hand Rad pulse palpable.  No loss of strength or unilat weakness or neurological changes present on exam.  Pt however reports that he hasn't been able to grip things as well with his left hand and that it feels weak and/or stiff.  Daughter [name] called by PA Lucianne Lei during [facility] visit today.  Escorted via wc with cane and belongings to exit, daughter taking him home.

## 2018-11-15 NOTE — Telephone Encounter (Signed)
With initial call, denies use of ice, Tylenol, Ibuprofen or anything to relieve left hand.

## 2018-11-15 NOTE — Patient Instructions (Signed)
Arthritis Arthritis means joint pain. It can also mean joint disease. A joint is a place where bones come together. There are more than 100 types of arthritis. What are the causes? This condition may be caused by:  Wear and tear of a joint. This is the most common cause.  A lot of acid in the blood, which leads to pain in the joint (gout).  Pain and swelling (inflammation) in a joint.  Infection of a joint.  Injuries in the joint.  A reaction to medicines (allergy). In some cases, the cause may not be known. What are the signs or symptoms? Symptoms of this condition include:  Redness at a joint.  Swelling at a joint.  Stiffness at a joint.  Warmth coming from the joint.  A fever.  A feeling of being sick. How is this treated? This condition may be treated with:  Treating the cause, if it is known.  Rest.  Raising (elevating) the joint.  Putting cold or hot packs on the joint.  Medicines to treat symptoms and reduce pain and swelling.  Shots of medicines (cortisone) into the joint. You may also be told to make changes in your life, such as doing exercises and losing weight. Follow these instructions at home: Medicines  Take over-the-counter and prescription medicines only as told by your doctor.  Do not take aspirin for pain if your doctor says that you may have gout. Activity  Rest your joint if your doctor tells you to.  Avoid activities that make the pain worse.  Exercise your joint regularly as told by your doctor. Try doing exercises like: ? Swimming. ? Water aerobics. ? Biking. ? Walking. Managing pain, stiffness, and swelling      If told, put ice on the affected area. ? Put ice in a plastic bag. ? Place a towel between your skin and the bag. ? Leave the ice on for 20 minutes, 2-3 times per day.  If your joint is swollen, raise (elevate) it above the level of your heart if told by your doctor.  If your joint feels stiff in the morning,  try taking a warm shower.  If told, put heat on the affected area. Do this as often as told by your doctor. Use the heat source that your doctor recommends, such as a moist heat pack or a heating pad. If you have diabetes, do not apply heat without asking your doctor. To apply heat: ? Place a towel between your skin and the heat source. ? Leave the heat on for 20-30 minutes. ? Remove the heat if your skin turns bright red. This is very important if you are unable to feel pain, heat, or cold. You may have a greater risk of getting burned. General instructions  Do not use any products that contain nicotine or tobacco, such as cigarettes, e-cigarettes, and chewing tobacco. If you need help quitting, ask your doctor.  Keep all follow-up visits as told by your doctor. This is important. Contact a doctor if:  The pain gets worse.  You have a fever. Get help right away if:  You have very bad pain in your joint.  You have swelling in your joint.  Your joint is red.  Many joints become painful and swollen.  You have very bad back pain.  Your leg is very weak.  You cannot control your pee (urine) or poop (stool). Summary  Arthritis means joint pain. It can also mean joint disease. A joint is a place   where bones come together.  The most common cause of this condition is wear and tear of a joint.  Symptoms of this condition include redness, swelling, or stiffness of the joint.  This condition is treated with rest, raising the joint, medicines, and putting cold or hot packs on the joint.  Follow your doctor's instructions about medicines, activity, exercises, and other home care treatments. This information is not intended to replace advice given to you by your health care provider. Make sure you discuss any questions you have with your health care provider. Document Released: 07/12/2009 Document Revised: 03/25/2018 Document Reviewed: 03/25/2018 Elsevier Patient Education  2020  Elsevier Inc.  

## 2018-11-15 NOTE — Telephone Encounter (Signed)
Contacted patient's daughter Tobie Poet [phone]) in response to call to [facility] nurse triage line regarding patient's swollen hand. Per Dr. [name], patient can come to [facility] this afternoon to have hand evaluated. Daughter states she will bring him. [facility] contacted. Patient can be seen this afternoon. Schedule message sent. Daughter asked to be called during appt - she states her father is forgetful. Appt note added to schedule message.

## 2018-11-18 ENCOUNTER — Telehealth: Payer: Self-pay | Admitting: Medical

## 2018-11-18 NOTE — Telephone Encounter (Signed)
No los per 7/17.

## 2018-11-19 NOTE — Progress Notes (Signed)
[facility] Progress Note   Tony [name] [id number] 1930-12-22 85 y.o.  Tony [name] is managed by Dr. Sullivan Lone  Actively treated with chemotherapy/immunotherapy/hormonal therapy: yes  Current therapy: Lupron and Xgeva  Last treated: 11/05/2018 (treatment 17)  Next scheduled appointment with provider: 12/03/2018  Assessment: Plan:    Swelling of left hand  Malignant neoplasm of prostate ([place])   Swelling of the left hand: The patient was given a prednisone taper as I suspect that his hand swelling could be related to arthritis.  Metastatic prostate cancer: Rev. [name] continues to be followed by Dr. Sullivan Lone and is status post treatment 17 with Lupron and Xgeva which was dosed on 11/05/2018.  He is scheduled to follow-up on 11/30/2018.  Please see After Visit Summary for patient specific instructions.  Future Appointments  Date Time Provider [place]  12/03/2018  8:45 AM [facility] 2 [facility] None  12/03/2018  9:20 AM Brunetta Genera, MD [facility] None  12/03/2018 10:00 AM [facility] [place] FLUSH [facility] None  12/31/2018  9:30 AM [facility] LAB 5 [facility] None  12/31/2018 10:00 AM [facility] [place] FLUSH [facility] None  01/28/2019  9:30 AM [facility] LAB 3 [facility] None  01/28/2019 10:00 AM [facility] [place] FLUSH [facility] None  02/25/2019  9:30 AM [facility] LAB 6 [facility] None  02/25/2019 10:00 AM [facility] [place] FLUSH [facility] None  03/25/2019  9:30 AM [facility] 5 [facility] None  03/25/2019 10:00 AM [facility] [place] FLUSH [facility] None  04/22/2019  9:30 AM [facility] LAB 3 [facility] None  04/22/2019 10:00 AM [facility] [place] FLUSH [facility] None    No orders of the defined types were placed in this encounter.      Subjective:   Patient ID:  Tony [name] is a 85 y.o. (DOB 1931-02-28) male.  Chief Complaint:  Chief Complaint  Patient presents with  . Hand Problem    HPI Tony [name] is an 85 year old male  with a metastatic prostate cancer. Rev. Menz continues to be followed by Dr. Sullivan Lone and is status post treatment 17 with Lupron and Xgeva which was dosed on 11/05/2018.  He is scheduled to follow-up on 11/30/2018.  He presents to the clinic today with a report of swelling of his left hand.  He reports his left hand "feels funny" he denies numbness or pain but just states that he is having changes in feeling on notes difficulty gripping things.  He has noticed no difference in his strength and has no other neurologic symptoms.  He denies any trauma or changes in activity.  Medications: I have reviewed the patient's current medications.  Allergies: No Known Allergies  Past Medical History:  Diagnosis Date  . BPH associated with nocturia    flomax 0.4--> 0.8 mg trial. nocturia if has coffee. some incontinence  . CAD (coronary artery disease)    LAD Stent 2012. 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   . Gout    no rx. apparently 1x in past  . History of stroke    no aspirin before stroke. no deficits. slurred words at time of stroke  . Hyperlipidemia   . Hypertension     Past Surgical History:  Procedure Laterality Date  . CARDIAC SURGERY     Stint  . CORONARY STENT PLACEMENT    . left arm fracture s/p surgery    . right leg fracture s.p surgery- screws like arm      Family History  Problem  Relation Age of Onset  . Breast cancer Mother   . Heart disease Father   . Heart disease Brother        x2  . Prostate cancer Brother     Social History   Socioeconomic History  . Marital status: Married    Spouse name: Not on file  . Number of children: Not on file  . Years of education: Not on file  . Highest education level: Not on file  Occupational History  . Not on file  Social Needs  . Financial resource strain: Not on file  . Food insecurity    Worry: Not on file    Inability: Not on file  . Transportation needs    Medical: Not on file     Non-medical: Not on file  Tobacco Use  . Smoking status: Former Smoker    Packs/day: 0.50    Years: 1.00    Pack years: 0.50    Types: Cigarettes    Quit date: 05/01/1952    Years since quitting: 66.5  . Smokeless tobacco: Never Used  Substance and Sexual Activity  . Alcohol use: No  . Drug use: No  . Sexual activity: Not on file  Lifestyle  . Physical activity    Days per week: Not on file    Minutes per session: Not on file  . Stress: Not on file  Relationships  . Social Herbalist on phone: Not on file    Gets together: Not on file    Attends religious service: Not on file    Active member of club or organization: Not on file    Attends meetings of clubs or organizations: Not on file    Relationship status: Not on file  . Intimate partner violence    Fear of current or ex partner: Not on file    Emotionally abused: Not on file    Physically abused: Not on file    Forced sexual activity: Not on file  Other Topics Concern  . Not on file  Social History Narrative   Married- lives separate from wife. 2 children. Daughter passed from heart attack.       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   Please see review of systems for further details on the patient's review from today.   Review of Systems:  Review of Systems  Constitutional: Negative for chills, diaphoresis and fever.  HENT: Negative for trouble swallowing and voice change.   Respiratory: Negative for cough, chest tightness, shortness of breath and wheezing.   Cardiovascular: Negative for chest pain and palpitations.  Gastrointestinal: Negative for abdominal pain, constipation, diarrhea, nausea and vomiting.  Musculoskeletal: Negative for back pain and myalgias.        Swelling of the left hand with changes in sensation.  Patient reports that his  left "feels funny."  Neurological: Negative for dizziness, light-headedness and headaches.    Objective:   Physical Exam:  BP (!) 167/77 (BP Location: Right Arm, Patient Position: Sitting) Comment: nutrse aware of bp  Pulse 83   Temp 99.8 F (37.7 C) (Oral)   Resp 18   Ht 5\' 7"  (1.702 m)   Wt 172 lb 11.2 oz (78.3 kg)   SpO2 100%   BMI 27.05 kg/m  ECOG: 1  Physical Exam Constitutional:  General: He is not in acute distress.    Appearance: He is not diaphoretic.  HENT:     Head: Normocephalic and atraumatic.  Eyes:     General: No scleral icterus.       Right eye: No discharge.        Left eye: No discharge.     Conjunctiva/sclera: Conjunctivae normal.  Cardiovascular:     Rate and Rhythm: Normal rate and regular rhythm.     Heart sounds: Normal heart sounds. No murmur. No friction rub. No gallop.   Pulmonary:     Effort: Pulmonary effort is normal. No respiratory distress.     Breath sounds: Normal breath sounds. No wheezing or rales.  Musculoskeletal:        General: Swelling (Mild swelling of the left hand.) and tenderness (Mild tenderness with an area of induration consistent with a muscle spasm and the left anterior mid biceps.) present.  Skin:    General: Skin is warm and dry.     Findings: No erythema or rash.  Neurological:     Mental Status: He is alert.     Gait: Gait abnormal (The patient is ambulating with the use of a cane.).  Psychiatric:        Mood and Affect: Mood normal.        Behavior: Behavior normal.        Thought Content: Thought content normal.        Judgment: Judgment normal.     Lab Review:     Component Value Date/Time   NA 137 11/05/2018 0922   K 4.7 11/05/2018 0922   CL 108 11/05/2018 0922   CO2 21 (L) 11/05/2018 0922   GLUCOSE 104 (H) 11/05/2018 0922   BUN 20 11/05/2018 0922   CREATININE 1.83 (H) 11/05/2018 0922   CALCIUM 8.1 (L) 11/05/2018 0922   PROT 7.1 11/05/2018 0922   ALBUMIN 3.7 11/05/2018 0922   AST 17  11/05/2018 0922   ALT 7 11/05/2018 0922   ALKPHOS 93 11/05/2018 0922   BILITOT 0.9 11/05/2018 0922   GFRNONAA 33 (L) 11/05/2018 0922   GFRAA 38 (L) 11/05/2018 0922       Component Value Date/Time   WBC 4.6 11/05/2018 0922   RBC 3.33 (L) 11/05/2018 0922   HGB 10.7 (L) 11/05/2018 0922   HGB 9.7 (L) 05/31/2018 1050   HCT 34.1 (L) 11/05/2018 0922   HCT 21.9 (L) 07/20/2017 2103   PLT 196 11/05/2018 0922   PLT 231 05/31/2018 1050   MCV 102.4 (H) 11/05/2018 0922   MCH 32.1 11/05/2018 0922   MCHC 31.4 11/05/2018 0922   RDW 13.1 11/05/2018 0922   LYMPHSABS 1.9 11/05/2018 0922   MONOABS 0.5 11/05/2018 0922   EOSABS 0.1 11/05/2018 0922   BASOSABS 0.0 11/05/2018 0922   -------------------------------  Imaging from last 24 hours (if applicable):  Radiology interpretation: No results found.      The patient's daughter, Tobie Poet was contacted via phone during the visit today.

## 2018-12-02 ENCOUNTER — Other Ambulatory Visit: Payer: Self-pay | Admitting: *Deleted

## 2018-12-02 MED ORDER — FENTANYL 25 MCG/HR TD PT72: 1.0000 | MEDICATED_PATCH | TRANSDERMAL | 0 refills | Status: DC

## 2018-12-02 NOTE — Telephone Encounter (Signed)
Daughter called - requested refill for father - Fentanyl patch

## 2018-12-02 NOTE — Progress Notes (Signed)
HEMATOLOGY/ONCOLOGY CLINIC NOTE  Date of Service: 12/02/18    Patient Care Team: [name], MD as PCP - General (Family Medicine)  CHIEF COMPLAINTS/PURPOSE OF CONSULTATION:  -recently diagnosed metastatic prostate cancer -Myelopthisic anemia  HISTORY OF PRESENTING ILLNESS:   Tony [name] is a wonderful 85 y.o. male who has been referred to [place] by Dr Tony [name] for evaluation and management of anemia. He is accompanied today by his daughter. The pt reports that he is doing well overall.   The pt reports a new onset of fatigue that began in January 2019. His daughter notes being able to tell a difference in his energy levels as early as November 2018. He notes that some cramping pain in his thighs. He notes that his recent 07/23/17 blood transfusion has led to a "terrific, immediate change" in his thigh pain. However, his thigh pain has returned in the last couple days.  He notes that he takes 1038mg Vitamin B12 daily.   He notes that for 6-7 years he took iron pills after his 2012 heart attack and stroke. He notes that he took a statin for 6-7 months and was taken off of it recently. He notes no unresolved symptoms from his stroke.   The pt notes that over the last 4-5 months his appetite has diminished and he has subsequently lost about 20 lbs in that time.  He notes that he has historically not preferred to drink water as such, but hydrates with juice, coffee, and other drinks.  He notes that he believes that he is able to take care of himself adequately at home. He also notes that his cane is sufficient for helping him get around.   Most recent lab results (07/22/17) of CBC  is as follows: all values are WNL except for RBC at 2.94, Hgb at 8.7, HCT at 27.2, RDW at 26.5, Platelets at 134k. CBC from 10/30/16 revealed all values WNL.  CMP 07/21/17 revealed all values WNL except for Sodium at 134, Glucose at 101, Creatinine at 1.85, Calcium at 7.9, Total Protein at 5.7, Albumin at  2.7, Alk Phos at 433.  LDH 07/20/17 elevated at 256. Haptoglobin 07/20/17 elevated at 357.  Vitamin B12 07/06/17 is WNL at 229.   On review of systems, pt reports decreased appetite, losing 20 lbs over 4-5 months, bilateral thigh pain, fatigue, mild leg swelling, and denies fevers, chills, night sweats, bone pains, nausea, abdominal pains, bleeding, blood in the urine, blood in the stools, black stools, changes in bowel habits, light headednss, dizziness, abdominal pains, noticing any new lumps or bumps, testicular pain or swelling, and any other symptoms.   On PMHx the pt reports heart attack and stroke in 2012. On Social Hx the pt denies much ETOH consumption.   Interval History:  [name] today regarding his metastatic prostate cancer and myelopthisic anemia. The patient's last visit with [place] on 10/08/2018. The pt reports that he is doing well overall.  The pt reports some hand swelling, which he believes was gout. He was given prednisone which helped his hand. He reports mild hot flashes and chills. He is eating well and is staying active. He tries not to take Oxycodone because is causes constipation. He notes some burning when he urinates, but does not feel that it has worsened. Pt is still taking PO Vit D supplement.  Lab results today (12/03/2018) of CBC w/diff and CMP is as follows: all values are WNL except for RBC at  3.25, HGB at 10.4, HCT at 34.0, MCV at 104.6, glucose at 114, Creatinine at 1.94, Calcium at 8.1.  On review of systems, pt reports physical activity, mild hot flashes, chills, B/L knee pain, burning with urination, and denies any other symptoms.   MEDICAL HISTORY:  Past Medical History:  Diagnosis Date  . BPH associated with nocturia    flomax 0.4--> 0.8 mg trial. nocturia if has coffee. some incontinence  . CAD (coronary artery disease)    LAD Stent 2012. 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   . Gout    no rx. apparently 1x in  past  . History of stroke    no aspirin before stroke. no deficits. slurred words at time of stroke  . Hyperlipidemia   . Hypertension     SURGICAL HISTORY: Past Surgical History:  Procedure Laterality Date  . CARDIAC SURGERY     Stint  . CORONARY STENT PLACEMENT    . left arm fracture s/p surgery    . right leg fracture s.p surgery- screws like arm      SOCIAL HISTORY: Social History   Socioeconomic History  . Marital status: Married    Spouse name: Not on file  . Number of children: Not on file  . Years of education: Not on file  . Highest education level: Not on file  Occupational History  . Not on file  Social Needs  . Financial resource strain: Not on file  . Food insecurity    Worry: Not on file    Inability: Not on file  . Transportation needs    Medical: Not on file    Non-medical: Not on file  Tobacco Use  . Smoking status: Former Smoker    Packs/day: 0.50    Years: 1.00    Pack years: 0.50    Types: Cigarettes    Quit date: 05/01/1952    Years since quitting: 66.6  . Smokeless tobacco: Never Used  Substance and Sexual Activity  . Alcohol use: No  . Drug use: No  . Sexual activity: Not on file  Lifestyle  . Physical activity    Days per week: Not on file    Minutes per session: Not on file  . Stress: Not on file  Relationships  . Social connections    Talks on phone: Not on file    Gets together: Not on file    Attends religious service: Not on file    Active member of club or organization: Not on file    Attends meetings of clubs or organizations: Not on file    Relationship status: Not on file  . Intimate partner violence    Fear of current or ex partner: Not on file    Emotionally abused: Not on file    Physically abused: Not on file    Forced sexual activity: Not on file  Other Topics Concern  . Not on file  Social History Narrative   Married- lives separate from wife. 2 children. Daughter passed from heart attack.       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 Breast cancer Mother   . Heart disease Father   . Heart disease Brother        x2  . Prostate cancer Brother     ALLERGIES:  has No Known Allergies.    MEDICATIONS:  Current Outpatient Medications  Medication Sig Dispense Refill  . aspirin EC 81 MG tablet Take 1 tablet (81 mg total) by mouth daily. 90 tablet 3  . calcium-vitamin D (OSCAL 500/200 D-3) 500-200 MG-UNIT tablet Take 1 tablet by mouth daily with breakfast. 30 tablet 2  . fentaNYL (DURAGESIC) 25 MCG/HR Place 1 patch onto the skin every 3 (three) days. 10 patch 0  . metoprolol succinate (TOPROL-XL) 25 MG 24 hr tablet TAKE 1 TABLET BY MOUTH EVERY DAY 90 tablet 0  . Multiple Vitamins-Minerals (MULTI COMPLETE PO) Take by mouth.    . nitroGLYCERIN (NITROSTAT) 0.4 MG SL tablet Place 1 tablet (0.4 mg total) under the tongue every 5 (five) minutes as needed for chest pain (3 maximum before seeking care). 30 tablet 0  . Omega-3 1000 MG CAPS Take 1 capsule by mouth daily.     . oxyCODONE (OXY IR/ROXICODONE) 5 MG immediate release tablet Take 1 tablet (5 mg total) by mouth every 4 (four) hours as needed for severe pain. 90 tablet 0  . polyethylene glycol (MIRALAX) packet Take 17 g by mouth daily. 30 each 1  . predniSONE (DELTASONE) 5 MG tablet 6 tab x 1 day, 5 tab x 1 day, 4 tab x 1 day, 3 tab x 1 day, 2 tab x 1 day, 1 tab x 1 day, stop 21 tablet 0  . senna-docusate (SENNA S) 8.6-50 MG tablet Take 2 tablets by mouth 2 (two) times daily. 120 tablet 2  . tamsulosin (FLOMAX) 0.4 MG CAPS capsule TAKE ONE CAPSULE BY MOUTH TWICE DAILY 180 capsule 3  . vitamin B-12 (CYANOCOBALAMIN) 1000 MCG tablet Take 1 tablet (1,000 mcg total) by mouth daily. 30 tablet 0  . Vitamin D, Ergocalciferol, (DRISDOL) 1.25 MG (50000 UT) CAPS capsule TAKE 1 CAPSULE BY MOUTH 2 TIMES A WEEK 24 capsule 3  . XTANDI 40 MG capsule TAKE 3  CAPSULES (120 MG TOTAL) BY MOUTH DAILY. 90 capsule 1   No current facility-administered medications for this visit.     REVIEW OF SYSTEMS:    A 10+ POINT REVIEW OF SYSTEMS WAS OBTAINED including neurology, dermatology, psychiatry, cardiac, respiratory, lymph, extremities, GI, GU, Musculoskeletal, constitutional, breasts, reproductive, HEENT.  All pertinent positives are noted in the HPI.  All others are negative.   PHYSICAL EXAMINATION:  There were no vitals filed for this visit. There were no vitals filed for this visit. .There is no height or weight on file to calculate BMI.  Pt was examined in a wheelchair  GENERAL:alert, in no acute distress and comfortable SKIN: no acute rashes, no significant lesions EYES: conjunctiva are pink and non-injected, sclera anicteric OROPHARYNX: MMM, no exudates, no oropharyngeal erythema or ulceration NECK: supple, no JVD LYMPH:  no palpable lymphadenopathy in the cervical, axillary or inguinal regions LUNGS: clear to auscultation b/l with normal respiratory effort HEART: regular rate & rhythm ABDOMEN:  normoactive bowel sounds , non tender, not distended. No palpable hepatosplenomegaly.  Extremity: no pedal edema PSYCH: alert & oriented x 3 with fluent speech NEURO: no focal motor/sensory deficits   LABORATORY DATA:  I have reviewed the data as listed  . CBC Latest Ref Rng & Units 11/05/2018 10/08/2018 09/10/2018  WBC 4.0 - 10.5 K/uL 4.6 3.9(L) 4.3  Hemoglobin 13.0 - 17.0 g/dL 10.7(L) 10.2(L) 9.8(L)  Hematocrit 39.0 - 52.0 % 34.1(L) 33.6(L) 32.2(L)  Platelets 150 - 400 K/uL 196 196 171   . CBC    Component Value Date/Time   WBC 4.6 11/05/2018   0922   RBC 3.33 (L) 11/05/2018 0922   HGB 10.7 (L) 11/05/2018 0922   HGB 9.7 (L) 05/31/2018 1050   HCT 34.1 (L) 11/05/2018 0922   HCT 21.9 (L) 07/20/2017 2103   PLT 196 11/05/2018 0922   PLT 231 05/31/2018 1050   MCV 102.4 (H) 11/05/2018 0922   MCH 32.1 11/05/2018 0922   MCHC 31.4 11/05/2018  0922   RDW 13.1 11/05/2018 0922   LYMPHSABS 1.9 11/05/2018 0922   MONOABS 0.5 11/05/2018 0922   EOSABS 0.1 11/05/2018 0922   BASOSABS 0.0 11/05/2018 0922     . CMP Latest Ref Rng & Units 11/05/2018 10/08/2018 09/10/2018  Glucose 70 - 99 mg/dL 104(H) 93 102(H)  BUN 8 - 23 mg/dL 20 20 16  Creatinine 0.61 - 1.24 mg/dL 1.83(H) 2.03(H) 1.91(H)  Sodium 135 - 145 mmol/L 137 139 139  Potassium 3.5 - 5.1 mmol/L 4.7 4.5 4.7  Chloride 98 - 111 mmol/L 108 109 108  CO2 22 - 32 mmol/L 21(L) 21(L) 23  Calcium 8.9 - 10.3 mg/dL 8.1(L) 8.1(L) 7.8(L)  Total Protein 6.5 - 8.1 g/dL 7.1 7.1 6.7  Total Bilirubin 0.3 - 1.2 mg/dL 0.9 0.7 0.9  Alkaline Phos 38 - 126 U/L 93 87 91  AST 15 - 41 U/L 17 15 15  ALT 0 - 44 U/L 7 7 10   PSA: Component     Latest Ref Rng & Units 04/18/2018 05/16/2018 06/13/2018 07/11/2018  Prostate Specific Ag, Serum     0.0 - 4.0 ng/mL 91.2 (H) 121.0 (H) 26.9 (H) 21.4 (H)   Component     Latest Ref Rng & Units 09/10/2018  Prostate Specific Ag, Serum     0.0 - 4.0 ng/mL 29.3 (H)    07/31/17 BM Bx:   RADIOGRAPHIC STUDIES: I have personally reviewed the radiological images as listed and agreed with the findings in the report. No results found.  ASSESSMENT & PLAN:   85 y.o. male with  1. Normocytic Anemia - due to primarily metastatic prostatic cancer (ACD + BM involvement with prostate cancer causing myelopthisic picture)  -Pt presented with a Hgb at 8.7 on 07/22/17, improved to 10.3 after receiving PRBC transfusion. -In review of the patient's previous CBC records, his anemia developed 6-7 months before presenting to care with me, unaccompanied by a drop in his EPO (07/20/17 elevated at 249.8);  LDH elevated  but haptoglobin at 357 on 07/20/17. -suggested against active hemolysis.  2. Recently diagnosed metastatic prostate cancer with extensive bone metastases. With bone mets/BM Mets and LNadenopathy. Exam positive for left retroperitoneal, bilateral pelvis and right inguinal  adenopathy. Exam positive for left retroperitoneal, bilateral pelvis and right inguinal adenopathy. Diffuse sclerotic bone metastasis.   3. Elevated alkaline phosphatase due to bone metastases from prostate cancer -CT BM bx -concerning for significant bone lesions in lower spine and pelvis PSA levels have declined to 26.9  4. Cancer related pain - primarily in b/l thighs. -much improved.  PLAN:  -Discussed pt labwork today, 12/03/2018; blood counts are stable, blood chemistries are steady -Most recent PSA from 09/10/2018 was at 29.3 -The pt has no prohibitive toxicities from continuing 120mg Xtandi at this time. Not dose escalating to 160mg for now given bothersome hot flashes. -Continue Lupron injections every 12 weeks -Discussed that we could begin SSRIs or Gabapentin for the pt's bothersome hot flashes, secondary to treatment. Pt would like to hold off on this for now. -Regarding the patient's anemia, I have set up transfusions prn for   hgb<8 -Recommended that the pt continue to eat well, drink at least 48-64 oz of water each day, and walk 20-30 minutes each day.  -Discussed that as long as the PSA stays stable, scans are not necessary -Continue 0.4mg Tamsulosin BID -Continue Xgeva every 4 weeks  -Continue on 81mg Aspirin -Continue Vitamin D and Calcium supplements. -Will attempt to add PSA onto today's labs. If not able, will just wait until the next appointment -Will see the pt back in 8 weeks   Continue labs and Xgeva q4 weeksx 4 Lupron q12weeks x 4 MD visit in 8 weeks   All of the patients questions were answered with apparent satisfaction. The patient knows to call the clinic with any problems, questions or concerns.  The total time spent in the appt was 20 minutes and more than 50% was on counseling and direct patient cares.   Gautam Kale MD MS AAHIVMS [facility] C[facility] Hematology/Oncology Physician [facility]  (Office):       [phone] (Work cell):   [phone] (Fax):           [phone]  12/02/2018 7:00 PM  I, Schuyler Bain, am acting as a scribe for Dr. Gautam Kale.   I, Emily Tufford, am acting as a scribe for Dr. Kale  .I have reviewed the above documentation for accuracy and completeness, and I agree with the above. .Gautam Kishore Kale MD    

## 2018-12-03 ENCOUNTER — Inpatient Hospital Stay: Payer: Medicare Other | Attending: Hematology

## 2018-12-03 ENCOUNTER — Other Ambulatory Visit: Payer: Self-pay

## 2018-12-03 ENCOUNTER — Inpatient Hospital Stay: Payer: Medicare Other

## 2018-12-03 ENCOUNTER — Other Ambulatory Visit: Payer: Medicare Other

## 2018-12-03 ENCOUNTER — Telehealth: Payer: Self-pay | Admitting: Hematology

## 2018-12-03 ENCOUNTER — Inpatient Hospital Stay (HOSPITAL_BASED_OUTPATIENT_CLINIC_OR_DEPARTMENT_OTHER): Payer: Medicare Other | Admitting: Hematology

## 2018-12-03 VITALS — BP 157/82 | HR 80 | Temp 98.5°F | Resp 18 | Ht 67.0 in | Wt 173.5 lb

## 2018-12-03 DIAGNOSIS — R590 Localized enlarged lymph nodes: Secondary | ICD-10-CM | POA: Diagnosis not present

## 2018-12-03 DIAGNOSIS — C7951 Secondary malignant neoplasm of bone: Secondary | ICD-10-CM | POA: Insufficient documentation

## 2018-12-03 DIAGNOSIS — D649 Anemia, unspecified: Secondary | ICD-10-CM | POA: Insufficient documentation

## 2018-12-03 DIAGNOSIS — C61 Malignant neoplasm of prostate: Secondary | ICD-10-CM | POA: Diagnosis present

## 2018-12-03 DIAGNOSIS — Z79899 Other long term (current) drug therapy: Secondary | ICD-10-CM | POA: Insufficient documentation

## 2018-12-03 DIAGNOSIS — G893 Neoplasm related pain (acute) (chronic): Secondary | ICD-10-CM | POA: Insufficient documentation

## 2018-12-03 DIAGNOSIS — R748 Abnormal levels of other serum enzymes: Secondary | ICD-10-CM | POA: Diagnosis not present

## 2018-12-03 DIAGNOSIS — D6182 Myelophthisis: Secondary | ICD-10-CM

## 2018-12-03 LAB — CBC WITH DIFFERENTIAL/PLATELET
Abs Immature Granulocytes: 0.05 10*3/uL (ref 0.00–0.07)
Basophils Absolute: 0 10*3/uL (ref 0.0–0.1)
Basophils Relative: 0 %
Eosinophils Absolute: 0.1 10*3/uL (ref 0.0–0.5)
Eosinophils Relative: 1 %
HCT: 34 % — ABNORMAL LOW (ref 39.0–52.0)
Hemoglobin: 10.4 g/dL — ABNORMAL LOW (ref 13.0–17.0)
Immature Granulocytes: 1 %
Lymphocytes Relative: 35 %
Lymphs Abs: 1.7 10*3/uL (ref 0.7–4.0)
MCH: 32 pg (ref 26.0–34.0)
MCHC: 30.6 g/dL (ref 30.0–36.0)
MCV: 104.6 fL — ABNORMAL HIGH (ref 80.0–100.0)
Monocytes Absolute: 0.5 10*3/uL (ref 0.1–1.0)
Monocytes Relative: 11 %
Neutro Abs: 2.5 10*3/uL (ref 1.7–7.7)
Neutrophils Relative %: 52 %
Platelets: 194 10*3/uL (ref 150–400)
RBC: 3.25 MIL/uL — ABNORMAL LOW (ref 4.22–5.81)
RDW: 13.2 % (ref 11.5–15.5)
WBC: 4.9 10*3/uL (ref 4.0–10.5)
nRBC: 0 % (ref 0.0–0.2)

## 2018-12-03 LAB — CMP (CANCER CENTER ONLY)
ALT: 7 U/L (ref 0–44)
AST: 16 U/L (ref 15–41)
Albumin: 3.9 g/dL (ref 3.5–5.0)
Alkaline Phosphatase: 89 U/L (ref 38–126)
Anion gap: 8 (ref 5–15)
BUN: 20 mg/dL (ref 8–23)
CO2: 22 mmol/L (ref 22–32)
Calcium: 8.1 mg/dL — ABNORMAL LOW (ref 8.9–10.3)
Chloride: 107 mmol/L (ref 98–111)
Creatinine: 1.94 mg/dL — ABNORMAL HIGH (ref 0.61–1.24)
GFR, Est AFR Am: 35 mL/min — ABNORMAL LOW (ref >60)
GFR, Estimated: 30 mL/min — ABNORMAL LOW (ref >60)
Glucose, Bld: 114 mg/dL — ABNORMAL HIGH (ref 70–99)
Potassium: 4.8 mmol/L (ref 3.5–5.1)
Sodium: 137 mmol/L (ref 135–145)
Total Bilirubin: 0.8 mg/dL (ref 0.3–1.2)
Total Protein: 7.2 g/dL (ref 6.5–8.1)

## 2018-12-03 MED ORDER — DENOSUMAB 120 MG/1.7ML ~~LOC~~ SOLN
SUBCUTANEOUS | Status: AC
  Filled 2018-12-03: qty 1.7

## 2018-12-03 MED ORDER — DENOSUMAB 120 MG/1.7ML ~~LOC~~ SOLN
120.0000 mg | Freq: Once | SUBCUTANEOUS | Status: AC
  Administered 2018-12-03: 12:00:00 120 mg via SUBCUTANEOUS

## 2018-12-03 NOTE — Telephone Encounter (Signed)
Scheduled appt per 8/4 los.

## 2018-12-03 NOTE — Progress Notes (Signed)
Spoke with Sandi per Dr [name] ok to treat today with with injection of a Calcium of 8.1

## 2018-12-03 NOTE — Patient Instructions (Signed)
Denosumab injection What is this medicine? DENOSUMAB (den oh sue mab) slows bone breakdown. Prolia is used to treat osteoporosis in women after menopause and in men, and in people who are taking corticosteroids for 6 months or more. Xgeva is used to treat a high calcium level due to cancer and to prevent bone fractures and other bone problems caused by multiple myeloma or cancer bone metastases. Xgeva is also used to treat giant cell tumor of the bone. This medicine may be used for other purposes; ask your health care provider or pharmacist if you have questions. COMMON BRAND NAME(S): Prolia, XGEVA What should I tell my health care provider before I take this medicine? They need to know if you have any of these conditions:  dental disease  having surgery or tooth extraction  infection  kidney disease  low levels of calcium or Vitamin D in the blood  malnutrition  on hemodialysis  skin conditions or sensitivity  thyroid or parathyroid disease  an unusual reaction to denosumab, other medicines, foods, dyes, or preservatives  pregnant or trying to get pregnant  breast-feeding How should I use this medicine? This medicine is for injection under the skin. It is given by a health care professional in a hospital or clinic setting. A special MedGuide will be given to you before each treatment. Be sure to read this information carefully each time. For Prolia, talk to your pediatrician regarding the use of this medicine in children. Special care may be needed. For Xgeva, talk to your pediatrician regarding the use of this medicine in children. While this drug may be prescribed for children as young as 13 years for selected conditions, precautions do apply. Overdosage: If you think you have taken too much of this medicine contact a poison control center or emergency room at once. NOTE: This medicine is only for you. Do not share this medicine with others. What if I miss a dose? It is  important not to miss your dose. Call your doctor or health care professional if you are unable to keep an appointment. What may interact with this medicine? Do not take this medicine with any of the following medications:  other medicines containing denosumab This medicine may also interact with the following medications:  medicines that lower your chance of fighting infection  steroid medicines like prednisone or cortisone This list may not describe all possible interactions. Give your health care provider a list of all the medicines, herbs, non-prescription drugs, or dietary supplements you use. Also tell them if you smoke, drink alcohol, or use illegal drugs. Some items may interact with your medicine. What should I watch for while using this medicine? Visit your doctor or health care professional for regular checks on your progress. Your doctor or health care professional may order blood tests and other tests to see how you are doing. Call your doctor or health care professional for advice if you get a fever, chills or sore throat, or other symptoms of a cold or flu. Do not treat yourself. This drug may decrease your body's ability to fight infection. Try to avoid being around people who are sick. You should make sure you get enough calcium and vitamin D while you are taking this medicine, unless your doctor tells you not to. Discuss the foods you eat and the vitamins you take with your health care professional. See your dentist regularly. Brush and floss your teeth as directed. Before you have any dental work done, tell your dentist you are   receiving this medicine. Do not become pregnant while taking this medicine or for 5 months after stopping it. Talk with your doctor or health care professional about your birth control options while taking this medicine. Women should inform their doctor if they wish to become pregnant or think they might be pregnant. There is a potential for serious side  effects to an unborn child. Talk to your health care professional or pharmacist for more information. What side effects may I notice from receiving this medicine? Side effects that you should report to your doctor or health care professional as soon as possible:  allergic reactions like skin rash, itching or hives, swelling of the face, lips, or tongue  bone pain  breathing problems  dizziness  jaw pain, especially after dental work  redness, blistering, peeling of the skin  signs and symptoms of infection like fever or chills; cough; sore throat; pain or trouble passing urine  signs of low calcium like fast heartbeat, muscle cramps or muscle pain; pain, tingling, numbness in the hands or feet; seizures  unusual bleeding or bruising  unusually weak or tired Side effects that usually do not require medical attention (report to your doctor or health care professional if they continue or are bothersome):  constipation  diarrhea  headache  joint pain  loss of appetite  muscle pain  runny nose  tiredness  upset stomach This list may not describe all possible side effects. Call your doctor for medical advice about side effects. You may report side effects to FDA at 1-800-FDA-1088. Where should I keep my medicine? This medicine is only given in a clinic, doctor's office, or other health care setting and will not be stored at home. NOTE: This sheet is a summary. It may not cover all possible information. If you have questions about this medicine, talk to your doctor, pharmacist, or health care provider.  2020 Elsevier/Gold Standard (2017-08-24 16:10:44)

## 2018-12-03 NOTE — Progress Notes (Signed)
Verbal order per Dr. [name]: Tony [name] to give [facility] today with Calcium 8.1

## 2018-12-19 MED FILL — XTANDI 40 MG CAPSULE: 40 | 30 days supply | Qty: 90 | Fill #1

## 2018-12-31 ENCOUNTER — Inpatient Hospital Stay: Payer: Medicare Other

## 2018-12-31 ENCOUNTER — Other Ambulatory Visit: Payer: Self-pay

## 2018-12-31 ENCOUNTER — Inpatient Hospital Stay: Payer: Medicare Other | Attending: Hematology

## 2018-12-31 VITALS — BP 127/65 | HR 74 | Temp 99.6°F | Resp 16

## 2018-12-31 DIAGNOSIS — C61 Malignant neoplasm of prostate: Secondary | ICD-10-CM

## 2018-12-31 DIAGNOSIS — Z79899 Other long term (current) drug therapy: Secondary | ICD-10-CM | POA: Insufficient documentation

## 2018-12-31 DIAGNOSIS — C7951 Secondary malignant neoplasm of bone: Secondary | ICD-10-CM | POA: Diagnosis not present

## 2018-12-31 LAB — CMP (CANCER CENTER ONLY)
ALT: 7 U/L (ref 0–44)
AST: 16 U/L (ref 15–41)
Albumin: 3.6 g/dL (ref 3.5–5.0)
Alkaline Phosphatase: 79 U/L (ref 38–126)
Anion gap: 7 (ref 5–15)
BUN: 27 mg/dL — ABNORMAL HIGH (ref 8–23)
CO2: 23 mmol/L (ref 22–32)
Calcium: 8.5 mg/dL — ABNORMAL LOW (ref 8.9–10.3)
Chloride: 109 mmol/L (ref 98–111)
Creatinine: 1.99 mg/dL — ABNORMAL HIGH (ref 0.61–1.24)
GFR, Est AFR Am: 34 mL/min — ABNORMAL LOW (ref >60)
GFR, Estimated: 30 mL/min — ABNORMAL LOW (ref >60)
Glucose, Bld: 98 mg/dL (ref 70–99)
Potassium: 4.4 mmol/L (ref 3.5–5.1)
Sodium: 139 mmol/L (ref 135–145)
Total Bilirubin: 0.9 mg/dL (ref 0.3–1.2)
Total Protein: 6.7 g/dL (ref 6.5–8.1)

## 2018-12-31 LAB — CBC WITH DIFFERENTIAL/PLATELET
Abs Immature Granulocytes: 0.02 10*3/uL (ref 0.00–0.07)
Basophils Absolute: 0 10*3/uL (ref 0.0–0.1)
Basophils Relative: 0 %
Eosinophils Absolute: 0.1 10*3/uL (ref 0.0–0.5)
Eosinophils Relative: 1 %
HCT: 33.2 % — ABNORMAL LOW (ref 39.0–52.0)
Hemoglobin: 10.4 g/dL — ABNORMAL LOW (ref 13.0–17.0)
Immature Granulocytes: 1 %
Lymphocytes Relative: 33 %
Lymphs Abs: 1.3 10*3/uL (ref 0.7–4.0)
MCH: 31.7 pg (ref 26.0–34.0)
MCHC: 31.3 g/dL (ref 30.0–36.0)
MCV: 101.2 fL — ABNORMAL HIGH (ref 80.0–100.0)
Monocytes Absolute: 0.4 10*3/uL (ref 0.1–1.0)
Monocytes Relative: 11 %
Neutro Abs: 2.1 10*3/uL (ref 1.7–7.7)
Neutrophils Relative %: 54 %
Platelets: 166 10*3/uL (ref 150–400)
RBC: 3.28 MIL/uL — ABNORMAL LOW (ref 4.22–5.81)
RDW: 13.2 % (ref 11.5–15.5)
WBC: 3.9 10*3/uL — ABNORMAL LOW (ref 4.0–10.5)
nRBC: 0 % (ref 0.0–0.2)

## 2018-12-31 MED ORDER — DENOSUMAB 120 MG/1.7ML ~~LOC~~ SOLN
120.0000 mg | Freq: Once | SUBCUTANEOUS | Status: AC
  Administered 2018-12-31: 120 mg via SUBCUTANEOUS

## 2018-12-31 MED ORDER — DENOSUMAB 120 MG/1.7ML ~~LOC~~ SOLN
SUBCUTANEOUS | Status: AC
  Filled 2018-12-31: qty 1.7

## 2018-12-31 NOTE — Patient Instructions (Signed)
Denosumab injection What is this medicine? DENOSUMAB (den oh sue mab) slows bone breakdown. Prolia is used to treat osteoporosis in women after menopause and in men, and in people who are taking corticosteroids for 6 months or more. Xgeva is used to treat a high calcium level due to cancer and to prevent bone fractures and other bone problems caused by multiple myeloma or cancer bone metastases. Xgeva is also used to treat giant cell tumor of the bone. This medicine may be used for other purposes; ask your health care provider or pharmacist if you have questions. COMMON BRAND NAME(S): Prolia, XGEVA What should I tell my health care provider before I take this medicine? They need to know if you have any of these conditions:  dental disease  having surgery or tooth extraction  infection  kidney disease  low levels of calcium or Vitamin D in the blood  malnutrition  on hemodialysis  skin conditions or sensitivity  thyroid or parathyroid disease  an unusual reaction to denosumab, other medicines, foods, dyes, or preservatives  pregnant or trying to get pregnant  breast-feeding How should I use this medicine? This medicine is for injection under the skin. It is given by a health care professional in a hospital or clinic setting. A special MedGuide will be given to you before each treatment. Be sure to read this information carefully each time. For Prolia, talk to your pediatrician regarding the use of this medicine in children. Special care may be needed. For Xgeva, talk to your pediatrician regarding the use of this medicine in children. While this drug may be prescribed for children as young as 13 years for selected conditions, precautions do apply. Overdosage: If you think you have taken too much of this medicine contact a poison control center or emergency room at once. NOTE: This medicine is only for you. Do not share this medicine with others. What if I miss a dose? It is  important not to miss your dose. Call your doctor or health care professional if you are unable to keep an appointment. What may interact with this medicine? Do not take this medicine with any of the following medications:  other medicines containing denosumab This medicine may also interact with the following medications:  medicines that lower your chance of fighting infection  steroid medicines like prednisone or cortisone This list may not describe all possible interactions. Give your health care provider a list of all the medicines, herbs, non-prescription drugs, or dietary supplements you use. Also tell them if you smoke, drink alcohol, or use illegal drugs. Some items may interact with your medicine. What should I watch for while using this medicine? Visit your doctor or health care professional for regular checks on your progress. Your doctor or health care professional may order blood tests and other tests to see how you are doing. Call your doctor or health care professional for advice if you get a fever, chills or sore throat, or other symptoms of a cold or flu. Do not treat yourself. This drug may decrease your body's ability to fight infection. Try to avoid being around people who are sick. You should make sure you get enough calcium and vitamin D while you are taking this medicine, unless your doctor tells you not to. Discuss the foods you eat and the vitamins you take with your health care professional. See your dentist regularly. Brush and floss your teeth as directed. Before you have any dental work done, tell your dentist you are   receiving this medicine. Do not become pregnant while taking this medicine or for 5 months after stopping it. Talk with your doctor or health care professional about your birth control options while taking this medicine. Women should inform their doctor if they wish to become pregnant or think they might be pregnant. There is a potential for serious side  effects to an unborn child. Talk to your health care professional or pharmacist for more information. What side effects may I notice from receiving this medicine? Side effects that you should report to your doctor or health care professional as soon as possible:  allergic reactions like skin rash, itching or hives, swelling of the face, lips, or tongue  bone pain  breathing problems  dizziness  jaw pain, especially after dental work  redness, blistering, peeling of the skin  signs and symptoms of infection like fever or chills; cough; sore throat; pain or trouble passing urine  signs of low calcium like fast heartbeat, muscle cramps or muscle pain; pain, tingling, numbness in the hands or feet; seizures  unusual bleeding or bruising  unusually weak or tired Side effects that usually do not require medical attention (report to your doctor or health care professional if they continue or are bothersome):  constipation  diarrhea  headache  joint pain  loss of appetite  muscle pain  runny nose  tiredness  upset stomach This list may not describe all possible side effects. Call your doctor for medical advice about side effects. You may report side effects to FDA at 1-800-FDA-1088. Where should I keep my medicine? This medicine is only given in a clinic, doctor's office, or other health care setting and will not be stored at home. NOTE: This sheet is a summary. It may not cover all possible information. If you have questions about this medicine, talk to your doctor, pharmacist, or health care provider.  2020 Elsevier/Gold Standard (2017-08-24 16:10:44)

## 2018-12-31 NOTE — Progress Notes (Signed)
Pt receiving Xgeva today, calcium level is 8.5 at this time Dr. [name] is ok with giving xgeva at this time.

## 2019-01-02 ENCOUNTER — Other Ambulatory Visit: Payer: Self-pay | Admitting: *Deleted

## 2019-01-02 NOTE — Telephone Encounter (Signed)
DAughter called and requested refill of Fentanyl patch

## 2019-01-03 ENCOUNTER — Other Ambulatory Visit: Payer: Self-pay | Admitting: Family Medicine

## 2019-01-03 MED ORDER — FENTANYL 25 MCG/HR TD PT72: 1.0000 | MEDICATED_PATCH | TRANSDERMAL | 0 refills | Status: DC

## 2019-01-20 ENCOUNTER — Other Ambulatory Visit: Payer: Self-pay | Admitting: Hematology

## 2019-01-20 DIAGNOSIS — C61 Malignant neoplasm of prostate: Secondary | ICD-10-CM

## 2019-01-21 MED FILL — XTANDI 40 MG CAPSULE: 40 | 30 days supply | Qty: 90 | Fill #0

## 2019-01-27 NOTE — Progress Notes (Signed)
HEMATOLOGY/ONCOLOGY CLINIC NOTE  Date of Service: 01/28/19    Patient Care Team: [name], MD as PCP - General (Family Medicine)  CHIEF COMPLAINTS/PURPOSE OF CONSULTATION:  -metastatic prostate cancer -Myelopthisic anemia  HISTORY OF PRESENTING ILLNESS:   Tony [name] is a wonderful 85 y.o. male who has been referred to [place] by Dr Dimas Chyle for evaluation and management of anemia. He is accompanied today by his daughter. The pt reports that he is doing well overall.   The pt reports a new onset of fatigue that began in January 2019. His daughter notes being able to tell a difference in his energy levels as early as November 2018. He notes that some cramping pain in his thighs. He notes that his recent 07/23/17 blood transfusion has led to a "terrific, immediate change" in his thigh pain. However, his thigh pain has returned in the last couple days.  He notes that he takes 109mg Vitamin B12 daily.   He notes that for 6-7 years he took iron pills after his 2012 heart attack and stroke. He notes that he took a statin for 6-7 months and was taken off of it recently. He notes no unresolved symptoms from his stroke.   The pt notes that over the last 4-5 months his appetite has diminished and he has subsequently lost about 20 lbs in that time.  He notes that he has historically not preferred to drink water as such, but hydrates with juice, coffee, and other drinks.  He notes that he believes that he is able to take care of himself adequately at home. He also notes that his cane is sufficient for helping him get around.   Most recent lab results (07/22/17) of CBC  is as follows: all values are WNL except for RBC at 2.94, Hgb at 8.7, HCT at 27.2, RDW at 26.5, Platelets at 134k. CBC from 10/30/16 revealed all values WNL.  CMP 07/21/17 revealed all values WNL except for Sodium at 134, Glucose at 101, Creatinine at 1.85, Calcium at 7.9, Total Protein at 5.7, Albumin at 2.7, Alk Phos at  433.  LDH 07/20/17 elevated at 256. Haptoglobin 07/20/17 elevated at 357.  Vitamin B12 07/06/17 is WNL at 229.   On review of systems, pt reports decreased appetite, losing 20 lbs over 4-5 months, bilateral thigh pain, fatigue, mild leg swelling, and denies fevers, chills, night sweats, bone pains, nausea, abdominal pains, bleeding, blood in the urine, blood in the stools, black stools, changes in bowel habits, light headednss, dizziness, abdominal pains, noticing any new lumps or bumps, testicular pain or swelling, and any other symptoms.   On PMHx the pt reports heart attack and stroke in 2012. On Social Hx the pt denies much ETOH consumption.   Interval History:  Mr. FWoods Gangemireturns today regarding his metastatic prostate cancer and myelopthisic anemia. The patient's last visit with [place] on 12/03/2018. The pt reports that he is doing well overall.  The pt reports that he does not have any new medical concerns and he is no longer experiencing hot flashes. His knee pain is the only pain that he is currently experiencing. Pt graduated from his first program and is no longer taking PT. His Fentanyl patch has mostly controlled his pain. Pt has only taken Oxycodone once since it was prescribed and notes that it helped significantly but is not inclined to take it again as it causes constipation. He also notes that the laxatives that he has been  prescribed do not help him much. He is eating well and his weight has been stable. He continues to take Xtandi 3 tabs per day and Ergocalciferol 2x per week.   Lab results today (01/28/19) of CBC w/diff and CMP is as follows: all values are WNL except for RBC at 3.14, Hgb at 10.2, HCT at 32.4, MCV at 103.2, Glucose at 118, BUN at 29, Creatinine at 1.96, Calcium at 8.2, GFR Est Non Af Am at 30. 01/28/2019 Prostate Specific AG is in progress   On review of systems, pt reports knee pain and denies hot flashes and any other symptoms.   MEDICAL HISTORY:  Past  Medical History:  Diagnosis Date  . BPH associated with nocturia    flomax 0.4--> 0.8 mg trial. nocturia if has coffee. some incontinence  . CAD (coronary artery disease)    LAD Stent 2012. 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   . Gout    no rx. apparently 1x in past  . History of stroke    no aspirin before stroke. no deficits. slurred words at time of stroke  . Hyperlipidemia   . Hypertension     SURGICAL HISTORY: Past Surgical History:  Procedure Laterality Date  . CARDIAC SURGERY     Stint  . CORONARY STENT PLACEMENT    . left arm fracture s/p surgery    . right leg fracture s.p surgery- screws like arm      SOCIAL HISTORY: Social History   Socioeconomic History  . Marital status: Married    Spouse name: Not on file  . Number of children: Not on file  . Years of education: Not on file  . Highest education level: Not on file  Occupational History  . Not on file  Social Needs  . Financial resource strain: Not on file  . Food insecurity    Worry: Not on file    Inability: Not on file  . Transportation needs    Medical: Not on file    Non-medical: Not on file  Tobacco Use  . Smoking status: Former Smoker    Packs/day: 0.50    Years: 1.00    Pack years: 0.50    Types: Cigarettes    Quit date: 05/01/1952    Years since quitting: 66.7  . Smokeless tobacco: Never Used  Substance and Sexual Activity  . Alcohol use: No  . Drug use: No  . Sexual activity: Not on file  Lifestyle  . Physical activity    Days per week: Not on file    Minutes per session: Not on file  . Stress: Not on file  Relationships  . Social Herbalist on phone: Not on file    Gets together: Not on file    Attends religious service: Not on file    Active member of club or organization: Not on file    Attends meetings of clubs or organizations: Not on file    Relationship status: Not on file  . Intimate partner violence    Fear of current or ex partner:  Not on file    Emotionally abused: Not on file    Physically abused: Not on file    Forced sexual activity: Not on file  Other Topics Concern  . Not on file  Social History Narrative   Married- lives separate from wife. 2 children. Daughter passed from heart attack.       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 Breast cancer Mother   . Heart disease Father   . Heart disease Brother        x2  . Prostate cancer Brother     ALLERGIES:  has No Known Allergies.  MEDICATIONS:  Current Outpatient Medications  Medication Sig Dispense Refill  . aspirin EC 81 MG tablet Take 1 tablet (81 mg total) by mouth daily. 90 tablet 3  . BIDIL 20-37.5 MG tablet TAKE 1 TABLET BY MOUTH TWICE DAILY 180 tablet 0  . calcium-vitamin D (OSCAL 500/200 D-3) 500-200 MG-UNIT tablet Take 1 tablet by mouth daily with breakfast. 30 tablet 2  . fentaNYL (DURAGESIC) 25 MCG/HR Place 1 patch onto the skin every 3 (three) days. 10 patch 0  . metoprolol succinate (TOPROL-XL) 25 MG 24 hr tablet TAKE 1 TABLET BY MOUTH EVERY DAY 90 tablet 0  . Multiple Vitamins-Minerals (MULTI COMPLETE PO) Take by mouth.    . nitroGLYCERIN (NITROSTAT) 0.4 MG SL tablet Place 1 tablet (0.4 mg total) under the tongue every 5 (five) minutes as needed for chest pain (3 maximum before seeking care). 30 tablet 0  . Omega-3 1000 MG CAPS Take 1 capsule by mouth daily.     Marland Kitchen oxyCODONE (OXY IR/ROXICODONE) 5 MG immediate release tablet Take 1 tablet (5 mg total) by mouth every 4 (four) hours as needed for severe pain. 90 tablet 0  . polyethylene glycol (MIRALAX) packet Take 17 g by mouth daily. 30 each 1  . predniSONE (DELTASONE) 5 MG tablet 6 tab x 1 day, 5 tab x 1 day, 4 tab x 1 day, 3 tab x 1 day, 2 tab x 1 day, 1 tab x 1 day, stop 21 tablet 0  . senna-docusate (SENNA S) 8.6-50 MG tablet Take 2 tablets by mouth 2 (two) times daily. 120 tablet 2   . tamsulosin (FLOMAX) 0.4 MG CAPS capsule TAKE ONE CAPSULE BY MOUTH TWICE DAILY 180 capsule 3  . vitamin B-12 (CYANOCOBALAMIN) 1000 MCG tablet Take 1 tablet (1,000 mcg total) by mouth daily. 30 tablet 0  . Vitamin D, Ergocalciferol, (DRISDOL) 1.25 MG (50000 UT) CAPS capsule TAKE 1 CAPSULE BY MOUTH 2 TIMES A WEEK 24 capsule 3  . XTANDI 40 MG capsule TAKE 3 CAPSULES (120 MG TOTAL) BY MOUTH DAILY. 90 capsule 1   No current facility-administered medications for this visit.     REVIEW OF SYSTEMS:    A 10+ POINT REVIEW OF SYSTEMS WAS OBTAINED including neurology, dermatology, psychiatry, cardiac, respiratory, lymph, extremities, GI, GU, Musculoskeletal, constitutional, breasts, reproductive, HEENT.  All pertinent positives are noted in the HPI.  All others are negative.   PHYSICAL EXAMINATION:  Vitals:   01/28/19 0949  BP: 134/62  Pulse: 78  Resp: 18  Temp: 99.7 F (37.6 C)  SpO2: 99%   Filed Weights   01/28/19 0949  Weight: 172 lb 12.8 oz (78.4 kg)   .Body mass index is 27.06 kg/m.  Exam was given in a wheelchair   GENERAL:alert, in no acute distress and comfortable SKIN: no acute rashes, no significant lesions EYES: conjunctiva are pink and non-injected, sclera anicteric OROPHARYNX: MMM, no exudates, no oropharyngeal erythema or ulceration NECK: supple, no JVD LYMPH:  no palpable lymphadenopathy in the cervical, axillary or inguinal regions LUNGS: clear to auscultation b/l with normal respiratory effort HEART: regular rate & rhythm ABDOMEN:  normoactive bowel sounds ,  non tender, not distended. No palpable hepatosplenomegaly.  Extremity: no pedal edema PSYCH: alert & oriented x 3 with fluent speech NEURO: no focal motor/sensory deficits  LABORATORY DATA:  I have reviewed the data as listed  . CBC Latest Ref Rng & Units 01/28/2019 12/31/2018 12/03/2018  WBC 4.0 - 10.5 K/uL 5.1 3.9(L) 4.9  Hemoglobin 13.0 - 17.0 g/dL 10.2(L) 10.4(L) 10.4(L)  Hematocrit 39.0 - 52.0 % 32.4(L)  33.2(L) 34.0(L)  Platelets 150 - 400 K/uL 199 166 194   . CBC    Component Value Date/Time   WBC 5.1 01/28/2019 0848   RBC 3.14 (L) 01/28/2019 0848   HGB 10.2 (L) 01/28/2019 0848   HGB 9.7 (L) 05/31/2018 1050   HCT 32.4 (L) 01/28/2019 0848   HCT 21.9 (L) 07/20/2017 2103   PLT 199 01/28/2019 0848   PLT 231 05/31/2018 1050   MCV 103.2 (H) 01/28/2019 0848   MCH 32.5 01/28/2019 0848   MCHC 31.5 01/28/2019 0848   RDW 13.3 01/28/2019 0848   LYMPHSABS 1.9 01/28/2019 0848   MONOABS 0.5 01/28/2019 0848   EOSABS 0.1 01/28/2019 0848   BASOSABS 0.0 01/28/2019 0848     . CMP Latest Ref Rng & Units 01/28/2019 12/31/2018 12/03/2018  Glucose 70 - 99 mg/dL 118(H) 98 114(H)  BUN 8 - 23 mg/dL 29(H) 27(H) 20  Creatinine 0.61 - 1.24 mg/dL 1.96(H) 1.99(H) 1.94(H)  Sodium 135 - 145 mmol/L 136 139 137  Potassium 3.5 - 5.1 mmol/L 4.6 4.4 4.8  Chloride 98 - 111 mmol/L 104 109 107  CO2 22 - 32 mmol/L _0 Calcium 8.9 - 10.3 mg/dL 8.2(L) 8.5(L) 8.1(L)  Total Protein 6.5 - 8.1 g/dL 6.8 6.7 7.2  Total Bilirubin 0.3 - 1.2 mg/dL 0.6 0.9 0.8  Alkaline Phos 38 - 126 U/L 95 79 89  AST 15 - 41 U/L _1 ALT 0 - 44 U/L _2 PSA: Component     Latest Ref Rng & Units 04/18/2018 05/16/2018 06/13/2018 07/11/2018  Prostate Specific Ag, Serum     0.0 - 4.0 ng/mL 91.2 (H) 121.0 (H) 26.9 (H) 21.4 (H)   Component     Latest Ref Rng & Units 09/10/2018  Prostate Specific Ag, Serum     0.0 - 4.0 ng/mL 29.3 (H)    07/31/17 BM Bx:   RADIOGRAPHIC STUDIES: I have personally reviewed the radiological images as listed and agreed with the findings in the report. No results found.  ASSESSMENT & PLAN:   85 y.o. male with  1. Normocytic Anemia - due to primarily metastatic prostatic cancer (ACD + BM involvement with prostate cancer causing myelopthisic picture)  -Pt presented with a Hgb at 8.7 on 07/22/17, improved to 10.3 after receiving PRBC transfusion. -In review of the patient's previous CBC records,  his anemia developed 6-7 months before presenting to care with me, unaccompanied by a drop in his EPO (07/20/17 elevated at 249.8);  LDH elevated  but haptoglobin at 357 on 07/20/17. -suggested against active hemolysis.  2. Recently diagnosed metastatic prostate cancer with extensive bone metastases. With bone mets/BM Mets and LNadenopathy. Exam positive for left retroperitoneal, bilateral pelvis and right inguinal adenopathy. Exam positive for left retroperitoneal, bilateral pelvis and right inguinal adenopathy. Diffuse sclerotic bone metastasis.   3. Elevated alkaline phosphatase due to bone metastases from prostate cancer -CT BM bx -concerning for significant bone lesions in lower spine and pelvis PSA levels have declined to 26.9  4. Cancer related pain -  primarily in b/l thighs. -much improved.  PLAN:  -Discussed pt labwork today, 01/28/19; all values are WNL except for RBC at 3.14, Hgb at 10.2, HCT at 32.4, MCV at 103.2, Glucose at 118, BUN at 29, Creatinine at 1.96, Calcium at 8.2, GFR Est Non Af Am at 30. 01/28/2019 Prostate Specific AG is in progress  -Will consider changing Xtandi 120 mg to 4x per day  -The pt has no prohibitive toxicities from continuing 149m Xtandi at this time.  -Regarding the patient's anemia, I have set up transfusions prn for hgb<8 -Discussed that as long as the PSA stays stable, scans are not necessary -Will repeat the PET/CT Scan based on Prostate Specific AG today  -Continue Lupron injections every 12 weeks -Continue 0.429mTamsulosin BID -Continue Xgeva every 4 weeks  -Continue on 8115mspirin -Continue Vitamin D and Calcium supplements. -Will see the pt back in 2 months   FOLLOW UP: Continue labs and Xgeva q4 weeksx 4 Lupron q12weeks x 4 MD visit in 8 weeks  The total time spent in the appt was 25 minutes and more than 50% was on counseling and direct patient cares.  All of the patient's questions were answered with apparent satisfaction. The  patient knows to call the clinic with any problems, questions or concerns.    GauSullivan Lone MS [place]HIVMS SCHLakeview Medical CenterHTexas Health Arlington Memorial Hospitalmatology/Oncology Physician ConWest Springs HospitalOffice):       [phone] cell):  [phone]):           [phone]/29/2020 1:14 PM  I, JazYevette Edwardsm acting as a scribe for Dr. GauSullivan Lone .I have reviewed the above documentation for accuracy and completeness, and I agree with the above. .GaBrunetta Genera

## 2019-01-28 ENCOUNTER — Telehealth: Payer: Self-pay | Admitting: Hematology

## 2019-01-28 ENCOUNTER — Other Ambulatory Visit: Payer: Medicare Other

## 2019-01-28 ENCOUNTER — Inpatient Hospital Stay: Payer: Medicare Other

## 2019-01-28 ENCOUNTER — Inpatient Hospital Stay (HOSPITAL_BASED_OUTPATIENT_CLINIC_OR_DEPARTMENT_OTHER): Payer: Medicare Other | Admitting: Hematology

## 2019-01-28 ENCOUNTER — Other Ambulatory Visit: Payer: Self-pay

## 2019-01-28 VITALS — BP 122/65 | HR 80 | Temp 98.7°F | Resp 18

## 2019-01-28 VITALS — BP 134/62 | HR 78 | Temp 99.7°F | Resp 18 | Ht 67.0 in | Wt 172.8 lb

## 2019-01-28 DIAGNOSIS — C61 Malignant neoplasm of prostate: Secondary | ICD-10-CM

## 2019-01-28 DIAGNOSIS — C7951 Secondary malignant neoplasm of bone: Secondary | ICD-10-CM | POA: Diagnosis not present

## 2019-01-28 DIAGNOSIS — G893 Neoplasm related pain (acute) (chronic): Secondary | ICD-10-CM | POA: Diagnosis not present

## 2019-01-28 DIAGNOSIS — Z79899 Other long term (current) drug therapy: Secondary | ICD-10-CM | POA: Diagnosis not present

## 2019-01-28 DIAGNOSIS — D6182 Myelophthisis: Secondary | ICD-10-CM

## 2019-01-28 LAB — CBC WITH DIFFERENTIAL/PLATELET
Abs Immature Granulocytes: 0.02 10*3/uL (ref 0.00–0.07)
Basophils Absolute: 0 10*3/uL (ref 0.0–0.1)
Basophils Relative: 0 %
Eosinophils Absolute: 0.1 10*3/uL (ref 0.0–0.5)
Eosinophils Relative: 1 %
HCT: 32.4 % — ABNORMAL LOW (ref 39.0–52.0)
Hemoglobin: 10.2 g/dL — ABNORMAL LOW (ref 13.0–17.0)
Immature Granulocytes: 0 %
Lymphocytes Relative: 36 %
Lymphs Abs: 1.9 10*3/uL (ref 0.7–4.0)
MCH: 32.5 pg (ref 26.0–34.0)
MCHC: 31.5 g/dL (ref 30.0–36.0)
MCV: 103.2 fL — ABNORMAL HIGH (ref 80.0–100.0)
Monocytes Absolute: 0.5 10*3/uL (ref 0.1–1.0)
Monocytes Relative: 10 %
Neutro Abs: 2.7 10*3/uL (ref 1.7–7.7)
Neutrophils Relative %: 53 %
Platelets: 199 10*3/uL (ref 150–400)
RBC: 3.14 MIL/uL — ABNORMAL LOW (ref 4.22–5.81)
RDW: 13.3 % (ref 11.5–15.5)
WBC: 5.1 10*3/uL (ref 4.0–10.5)
nRBC: 0 % (ref 0.0–0.2)

## 2019-01-28 LAB — CMP (CANCER CENTER ONLY)
ALT: 6 U/L (ref 0–44)
AST: 15 U/L (ref 15–41)
Albumin: 3.8 g/dL (ref 3.5–5.0)
Alkaline Phosphatase: 95 U/L (ref 38–126)
Anion gap: 10 (ref 5–15)
BUN: 29 mg/dL — ABNORMAL HIGH (ref 8–23)
CO2: 22 mmol/L (ref 22–32)
Calcium: 8.2 mg/dL — ABNORMAL LOW (ref 8.9–10.3)
Chloride: 104 mmol/L (ref 98–111)
Creatinine: 1.96 mg/dL — ABNORMAL HIGH (ref 0.61–1.24)
GFR, Est AFR Am: 35 mL/min — ABNORMAL LOW (ref >60)
GFR, Estimated: 30 mL/min — ABNORMAL LOW (ref >60)
Glucose, Bld: 118 mg/dL — ABNORMAL HIGH (ref 70–99)
Potassium: 4.6 mmol/L (ref 3.5–5.1)
Sodium: 136 mmol/L (ref 135–145)
Total Bilirubin: 0.6 mg/dL (ref 0.3–1.2)
Total Protein: 6.8 g/dL (ref 6.5–8.1)

## 2019-01-28 MED ORDER — DENOSUMAB 120 MG/1.7ML ~~LOC~~ SOLN
120.0000 mg | Freq: Once | SUBCUTANEOUS | Status: AC
  Administered 2019-01-28: 12:00:00 120 mg via SUBCUTANEOUS

## 2019-01-28 MED ORDER — DENOSUMAB 120 MG/1.7ML ~~LOC~~ SOLN
SUBCUTANEOUS | Status: AC
  Filled 2019-01-28: qty 1.7

## 2019-01-28 MED ORDER — LEUPROLIDE ACETATE (3 MONTH) 22.5 MG ~~LOC~~ KIT
22.5000 mg | PACK | Freq: Once | SUBCUTANEOUS | Status: AC
  Administered 2019-01-28: 12:00:00 22.5 mg via SUBCUTANEOUS
  Filled 2019-01-28: qty 22.5

## 2019-01-28 NOTE — Telephone Encounter (Signed)
Scheduled appt per 9/29 los. ° °Spoke with patient daughter and she is aware of the appt date and time. °

## 2019-01-28 NOTE — Patient Instructions (Signed)
Leuprolide depot injection What is this medicine? LEUPROLIDE (loo PROE lide) is a man-made protein that acts like a natural hormone in the body. It decreases testosterone in men and decreases estrogen in women. In men, this medicine is used to treat advanced prostate cancer. In women, some forms of this medicine may be used to treat endometriosis, uterine fibroids, or other male hormone-related problems. This medicine may be used for other purposes; ask your health care provider or pharmacist if you have questions. COMMON BRAND NAME(S): Eligard, Fensolv, Lupron Depot, Lupron Depot-Ped, Viadur What should I tell my health care provider before I take this medicine? They need to know if you have any of these conditions:  diabetes  heart disease or previous heart attack  high blood pressure  high cholesterol  mental illness  osteoporosis  pain or difficulty passing urine  seizures  spinal cord metastasis  stroke  suicidal thoughts, plans, or attempt; a previous suicide attempt by you or a family member  tobacco smoker  unusual vaginal bleeding (women)  an unusual or allergic reaction to leuprolide, benzyl alcohol, other medicines, foods, dyes, or preservatives  pregnant or trying to get pregnant  breast-feeding How should I use this medicine? This medicine is for injection into a muscle or for injection under the skin. It is given by a health care professional in a hospital or clinic setting. The specific product will determine how it will be given to you. Make sure you understand which product you receive and how often you will receive it. Talk to your pediatrician regarding the use of this medicine in children. Special care may be needed. Overdosage: If you think you have taken too much of this medicine contact a poison control center or emergency room at once. NOTE: This medicine is only for you. Do not share this medicine with others. What if I miss a dose? It is  important not to miss a dose. Call your doctor or health care professional if you are unable to keep an appointment. Depot injections: Depot injections are given either once-monthly, every 12 weeks, every 16 weeks, or every 24 weeks depending on the product you are prescribed. The product you are prescribed will be based on if you are male or male, and your condition. Make sure you understand your product and dosing. What may interact with this medicine? Do not take this medicine with any of the following medications:  chasteberry This medicine may also interact with the following medications:  herbal or dietary supplements, like black cohosh or DHEA  male hormones, like estrogens or progestins and birth control pills, patches, rings, or injections  male hormones, like testosterone This list may not describe all possible interactions. Give your health care provider a list of all the medicines, herbs, non-prescription drugs, or dietary supplements you use. Also tell them if you smoke, drink alcohol, or use illegal drugs. Some items may interact with your medicine. What should I watch for while using this medicine? Visit your doctor or health care professional for regular checks on your progress. During the first weeks of treatment, your symptoms may get worse, but then will improve as you continue your treatment. You may get hot flashes, increased bone pain, increased difficulty passing urine, or an aggravation of nerve symptoms. Discuss these effects with your doctor or health care professional, some of them may improve with continued use of this medicine. Male patients may experience a menstrual cycle or spotting during the first months of therapy with this medicine.   If this continues, contact your doctor or health care professional. This medicine may increase blood sugar. Ask your healthcare provider if changes in diet or medicines are needed if you have diabetes. What side effects may I  notice from receiving this medicine? Side effects that you should report to your doctor or health care professional as soon as possible:  allergic reactions like skin rash, itching or hives, swelling of the face, lips, or tongue  breathing problems  chest pain  depression or memory disorders  pain in your legs or groin  pain at site where injected or implanted  seizures  severe headache  signs and symptoms of high blood sugar such as being more thirsty or hungry or having to urinate more than normal. You may also feel very tired or have blurry vision  swelling of the feet and legs  suicidal thoughts or other mood changes  visual changes  vomiting Side effects that usually do not require medical attention (report to your doctor or health care professional if they continue or are bothersome):  breast swelling or tenderness  decrease in sex drive or performance  diarrhea  hot flashes  loss of appetite  muscle, joint, or bone pains  nausea  redness or irritation at site where injected or implanted  skin problems or acne This list may not describe all possible side effects. Call your doctor for medical advice about side effects. You may report side effects to FDA at 1-800-FDA-1088. Where should I keep my medicine? This drug is given in a hospital or clinic and will not be stored at home. NOTE: This sheet is a summary. It may not cover all possible information. If you have questions about this medicine, talk to your doctor, pharmacist, or health care provider.  2020 [employer]/Gold Standard (2018-02-14 09:27:03) Denosumab injection What is this medicine? DENOSUMAB (den oh sue mab) slows bone breakdown. Prolia is used to treat osteoporosis in women after menopause and in men, and in people who are taking corticosteroids for 6 months or more. Xgeva is used to treat a high calcium level due to cancer and to prevent bone fractures and other bone problems caused by multiple  myeloma or cancer bone metastases. Xgeva is also used to treat giant cell tumor of the bone. This medicine may be used for other purposes; ask your health care provider or pharmacist if you have questions. COMMON BRAND NAME(S): Prolia, XGEVA What should I tell my health care provider before I take this medicine? They need to know if you have any of these conditions:  dental disease  having surgery or tooth extraction  infection  kidney disease  low levels of calcium or Vitamin D in the blood  malnutrition  on hemodialysis  skin conditions or sensitivity  thyroid or parathyroid disease  an unusual reaction to denosumab, other medicines, foods, dyes, or preservatives  pregnant or trying to get pregnant  breast-feeding How should I use this medicine? This medicine is for injection under the skin. It is given by a health care professional in a hospital or clinic setting. A special MedGuide will be given to you before each treatment. Be sure to read this information carefully each time. For Prolia, talk to your pediatrician regarding the use of this medicine in children. Special care may be needed. For Xgeva, talk to your pediatrician regarding the use of this medicine in children. While this drug may be prescribed for children as young as 13 years for selected conditions, precautions do apply. Overdosage:   If you think you have taken too much of this medicine contact a poison control center or emergency room at once. NOTE: This medicine is only for you. Do not share this medicine with others. What if I miss a dose? It is important not to miss your dose. Call your doctor or health care professional if you are unable to keep an appointment. What may interact with this medicine? Do not take this medicine with any of the following medications:  other medicines containing denosumab This medicine may also interact with the following medications:  medicines that lower your chance of  fighting infection  steroid medicines like prednisone or cortisone This list may not describe all possible interactions. Give your health care provider a list of all the medicines, herbs, non-prescription drugs, or dietary supplements you use. Also tell them if you smoke, drink alcohol, or use illegal drugs. Some items may interact with your medicine. What should I watch for while using this medicine? Visit your doctor or health care professional for regular checks on your progress. Your doctor or health care professional may order blood tests and other tests to see how you are doing. Call your doctor or health care professional for advice if you get a fever, chills or sore throat, or other symptoms of a cold or flu. Do not treat yourself. This drug may decrease your body's ability to fight infection. Try to avoid being around people who are sick. You should make sure you get enough calcium and vitamin D while you are taking this medicine, unless your doctor tells you not to. Discuss the foods you eat and the vitamins you take with your health care professional. See your dentist regularly. Brush and floss your teeth as directed. Before you have any dental work done, tell your dentist you are receiving this medicine. Do not become pregnant while taking this medicine or for 5 months after stopping it. Talk with your doctor or health care professional about your birth control options while taking this medicine. Women should inform their doctor if they wish to become pregnant or think they might be pregnant. There is a potential for serious side effects to an unborn child. Talk to your health care professional or pharmacist for more information. What side effects may I notice from receiving this medicine? Side effects that you should report to your doctor or health care professional as soon as possible:  allergic reactions like skin rash, itching or hives, swelling of the face, lips, or tongue  bone  pain  breathing problems  dizziness  jaw pain, especially after dental work  redness, blistering, peeling of the skin  signs and symptoms of infection like fever or chills; cough; sore throat; pain or trouble passing urine  signs of low calcium like fast heartbeat, muscle cramps or muscle pain; pain, tingling, numbness in the hands or feet; seizures  unusual bleeding or bruising  unusually weak or tired Side effects that usually do not require medical attention (report to your doctor or health care professional if they continue or are bothersome):  constipation  diarrhea  headache  joint pain  loss of appetite  muscle pain  runny nose  tiredness  upset stomach This list may not describe all possible side effects. Call your doctor for medical advice about side effects. You may report side effects to FDA at 1-800-FDA-1088. Where should I keep my medicine? This medicine is only given in a clinic, doctor's office, or other health care setting and will not be   stored at home. NOTE: This sheet is a summary. It may not cover all possible information. If you have questions about this medicine, talk to your doctor, pharmacist, or health care provider.  2020 [employer]/Gold Standard (2017-08-24 16:10:44)  

## 2019-01-28 NOTE — Progress Notes (Signed)
Per Dr. [name], okay to treat pt with calcium 8.2. Advised pt to increase calcium on week of injection. Pt verbalized understanding

## 2019-01-29 LAB — PROSTATE-SPECIFIC AG, SERUM (LABCORP): Prostate Specific Ag, Serum: 49.7 ng/mL — ABNORMAL HIGH (ref 0.0–4.0)

## 2019-02-03 ENCOUNTER — Other Ambulatory Visit: Payer: Self-pay | Admitting: *Deleted

## 2019-02-03 NOTE — Telephone Encounter (Signed)
Daughter called - patient needs refill of Fentanyl patches

## 2019-02-04 MED ORDER — FENTANYL 25 MCG/HR TD PT72: 1.0000 | MEDICATED_PATCH | TRANSDERMAL | 0 refills | Status: DC

## 2019-02-19 MED FILL — XTANDI 40 MG CAPSULE: 40 | 30 days supply | Qty: 90 | Fill #1

## 2019-02-25 ENCOUNTER — Inpatient Hospital Stay: Payer: Medicare Other | Attending: Hematology

## 2019-02-25 ENCOUNTER — Inpatient Hospital Stay: Payer: Medicare Other

## 2019-02-25 ENCOUNTER — Other Ambulatory Visit: Payer: Self-pay

## 2019-02-25 VITALS — BP 139/69 | HR 77 | Temp 98.6°F | Resp 18

## 2019-02-25 DIAGNOSIS — Z79899 Other long term (current) drug therapy: Secondary | ICD-10-CM | POA: Diagnosis not present

## 2019-02-25 DIAGNOSIS — C7951 Secondary malignant neoplasm of bone: Secondary | ICD-10-CM | POA: Diagnosis not present

## 2019-02-25 DIAGNOSIS — C61 Malignant neoplasm of prostate: Secondary | ICD-10-CM

## 2019-02-25 DIAGNOSIS — G893 Neoplasm related pain (acute) (chronic): Secondary | ICD-10-CM

## 2019-02-25 LAB — CMP (CANCER CENTER ONLY)
ALT: 6 U/L (ref 0–44)
AST: 14 U/L — ABNORMAL LOW (ref 15–41)
Albumin: 3.4 g/dL — ABNORMAL LOW (ref 3.5–5.0)
Alkaline Phosphatase: 104 U/L (ref 38–126)
Anion gap: 9 (ref 5–15)
BUN: 28 mg/dL — ABNORMAL HIGH (ref 8–23)
CO2: 23 mmol/L (ref 22–32)
Calcium: 8.7 mg/dL — ABNORMAL LOW (ref 8.9–10.3)
Chloride: 106 mmol/L (ref 98–111)
Creatinine: 2.12 mg/dL — ABNORMAL HIGH (ref 0.61–1.24)
GFR, Est AFR Am: 32 mL/min — ABNORMAL LOW (ref >60)
GFR, Estimated: 27 mL/min — ABNORMAL LOW (ref >60)
Glucose, Bld: 105 mg/dL — ABNORMAL HIGH (ref 70–99)
Potassium: 4.8 mmol/L (ref 3.5–5.1)
Sodium: 138 mmol/L (ref 135–145)
Total Bilirubin: 0.7 mg/dL (ref 0.3–1.2)
Total Protein: 7 g/dL (ref 6.5–8.1)

## 2019-02-25 LAB — CBC WITH DIFFERENTIAL/PLATELET
Abs Immature Granulocytes: 0.03 10*3/uL (ref 0.00–0.07)
Basophils Absolute: 0 10*3/uL (ref 0.0–0.1)
Basophils Relative: 0 %
Eosinophils Absolute: 0.1 10*3/uL (ref 0.0–0.5)
Eosinophils Relative: 1 %
HCT: 32.7 % — ABNORMAL LOW (ref 39.0–52.0)
Hemoglobin: 10.3 g/dL — ABNORMAL LOW (ref 13.0–17.0)
Immature Granulocytes: 1 %
Lymphocytes Relative: 37 %
Lymphs Abs: 1.8 10*3/uL (ref 0.7–4.0)
MCH: 32.1 pg (ref 26.0–34.0)
MCHC: 31.5 g/dL (ref 30.0–36.0)
MCV: 101.9 fL — ABNORMAL HIGH (ref 80.0–100.0)
Monocytes Absolute: 0.5 10*3/uL (ref 0.1–1.0)
Monocytes Relative: 10 %
Neutro Abs: 2.5 10*3/uL (ref 1.7–7.7)
Neutrophils Relative %: 51 %
Platelets: 219 10*3/uL (ref 150–400)
RBC: 3.21 MIL/uL — ABNORMAL LOW (ref 4.22–5.81)
RDW: 13 % (ref 11.5–15.5)
WBC: 4.9 10*3/uL (ref 4.0–10.5)
nRBC: 0 % (ref 0.0–0.2)

## 2019-02-25 MED ORDER — DENOSUMAB 120 MG/1.7ML ~~LOC~~ SOLN
SUBCUTANEOUS | Status: AC
  Filled 2019-02-25: qty 1.7

## 2019-02-25 MED ORDER — DENOSUMAB 120 MG/1.7ML ~~LOC~~ SOLN
120.0000 mg | Freq: Once | SUBCUTANEOUS | Status: AC
  Administered 2019-02-25: 11:00:00 120 mg via SUBCUTANEOUS

## 2019-02-25 NOTE — Patient Instructions (Signed)
Denosumab injection What is this medicine? DENOSUMAB (den oh sue mab) slows bone breakdown. Prolia is used to treat osteoporosis in women after menopause and in men, and in people who are taking corticosteroids for 6 months or more. Xgeva is used to treat a high calcium level due to cancer and to prevent bone fractures and other bone problems caused by multiple myeloma or cancer bone metastases. Xgeva is also used to treat giant cell tumor of the bone. This medicine may be used for other purposes; ask your health care provider or pharmacist if you have questions. COMMON BRAND NAME(S): Prolia, XGEVA What should I tell my health care provider before I take this medicine? They need to know if you have any of these conditions:  dental disease  having surgery or tooth extraction  infection  kidney disease  low levels of calcium or Vitamin D in the blood  malnutrition  on hemodialysis  skin conditions or sensitivity  thyroid or parathyroid disease  an unusual reaction to denosumab, other medicines, foods, dyes, or preservatives  pregnant or trying to get pregnant  breast-feeding How should I use this medicine? This medicine is for injection under the skin. It is given by a health care professional in a hospital or clinic setting. A special MedGuide will be given to you before each treatment. Be sure to read this information carefully each time. For Prolia, talk to your pediatrician regarding the use of this medicine in children. Special care may be needed. For Xgeva, talk to your pediatrician regarding the use of this medicine in children. While this drug may be prescribed for children as young as 13 years for selected conditions, precautions do apply. Overdosage: If you think you have taken too much of this medicine contact a poison control center or emergency room at once. NOTE: This medicine is only for you. Do not share this medicine with others. What if I miss a dose? It is  important not to miss your dose. Call your doctor or health care professional if you are unable to keep an appointment. What may interact with this medicine? Do not take this medicine with any of the following medications:  other medicines containing denosumab This medicine may also interact with the following medications:  medicines that lower your chance of fighting infection  steroid medicines like prednisone or cortisone This list may not describe all possible interactions. Give your health care provider a list of all the medicines, herbs, non-prescription drugs, or dietary supplements you use. Also tell them if you smoke, drink alcohol, or use illegal drugs. Some items may interact with your medicine. What should I watch for while using this medicine? Visit your doctor or health care professional for regular checks on your progress. Your doctor or health care professional may order blood tests and other tests to see how you are doing. Call your doctor or health care professional for advice if you get a fever, chills or sore throat, or other symptoms of a cold or flu. Do not treat yourself. This drug may decrease your body's ability to fight infection. Try to avoid being around people who are sick. You should make sure you get enough calcium and vitamin D while you are taking this medicine, unless your doctor tells you not to. Discuss the foods you eat and the vitamins you take with your health care professional. See your dentist regularly. Brush and floss your teeth as directed. Before you have any dental work done, tell your dentist you are   receiving this medicine. Do not become pregnant while taking this medicine or for 5 months after stopping it. Talk with your doctor or health care professional about your birth control options while taking this medicine. Women should inform their doctor if they wish to become pregnant or think they might be pregnant. There is a potential for serious side  effects to an unborn child. Talk to your health care professional or pharmacist for more information. What side effects may I notice from receiving this medicine? Side effects that you should report to your doctor or health care professional as soon as possible:  allergic reactions like skin rash, itching or hives, swelling of the face, lips, or tongue  bone pain  breathing problems  dizziness  jaw pain, especially after dental work  redness, blistering, peeling of the skin  signs and symptoms of infection like fever or chills; cough; sore throat; pain or trouble passing urine  signs of low calcium like fast heartbeat, muscle cramps or muscle pain; pain, tingling, numbness in the hands or feet; seizures  unusual bleeding or bruising  unusually weak or tired Side effects that usually do not require medical attention (report to your doctor or health care professional if they continue or are bothersome):  constipation  diarrhea  headache  joint pain  loss of appetite  muscle pain  runny nose  tiredness  upset stomach This list may not describe all possible side effects. Call your doctor for medical advice about side effects. You may report side effects to FDA at 1-800-FDA-1088. Where should I keep my medicine? This medicine is only given in a clinic, doctor's office, or other health care setting and will not be stored at home. NOTE: This sheet is a summary. It may not cover all possible information. If you have questions about this medicine, talk to your doctor, pharmacist, or health care provider.  2020 Elsevier/Gold Standard (2017-08-24 16:10:44)

## 2019-03-11 ENCOUNTER — Other Ambulatory Visit: Payer: Self-pay | Admitting: Hematology

## 2019-03-11 MED ORDER — FENTANYL 25 MCG/HR TD PT72: 1.0000 | MEDICATED_PATCH | TRANSDERMAL | 0 refills | Status: DC

## 2019-03-17 ENCOUNTER — Other Ambulatory Visit: Payer: Self-pay | Admitting: Hematology

## 2019-03-17 DIAGNOSIS — C61 Malignant neoplasm of prostate: Secondary | ICD-10-CM

## 2019-03-18 MED FILL — XTANDI 40 MG CAPSULE: 40 | 30 days supply | Qty: 90 | Fill #0

## 2019-03-18 NOTE — Telephone Encounter (Signed)
Refilled as per Dr.Kale OV note 01/28/2019 to continue [place]

## 2019-03-20 ENCOUNTER — Telehealth: Payer: Self-pay | Admitting: Hematology

## 2019-03-20 NOTE — Telephone Encounter (Signed)
[facility] 11/24 appointments moved to 12/1. Confirmed with dtr.   Remaining appointments will be adjusted at 12/1 visit as [place] is also out of the office the week of 12/29.

## 2019-03-25 ENCOUNTER — Other Ambulatory Visit: Payer: Medicare Other

## 2019-03-25 ENCOUNTER — Ambulatory Visit: Payer: Medicare Other

## 2019-03-25 ENCOUNTER — Ambulatory Visit: Payer: Medicare Other | Admitting: Hematology

## 2019-03-31 NOTE — Progress Notes (Signed)
HEMATOLOGY/ONCOLOGY CLINIC NOTE  Date of Service: 04/01/19    Patient Care Team: [name], MD as PCP - General (Family Medicine)  CHIEF COMPLAINTS/PURPOSE OF CONSULTATION:  -recently diagnosed metastatic prostate cancer -Myelopthisic anemia  HISTORY OF PRESENTING ILLNESS:   Tony [name] is a wonderful 85 y.o. male who has been referred to [place] by Dr Dimas Chyle for evaluation and management of anemia. He is accompanied today by his daughter. The pt reports that he is doing well overall.   The pt reports a new onset of fatigue that began in January 2019. His daughter notes being able to tell a difference in his energy levels as early as November 2018. He notes that some cramping pain in his thighs. He notes that his recent 07/23/17 blood transfusion has led to a "terrific, immediate change" in his thigh pain. However, his thigh pain has returned in the last couple days.  He notes that he takes 1017mg Vitamin B12 daily.   He notes that for 6-7 years he took iron pills after his 2012 heart attack and stroke. He notes that he took a statin for 6-7 months and was taken off of it recently. He notes no unresolved symptoms from his stroke.   The pt notes that over the last 4-5 months his appetite has diminished and he has subsequently lost about 20 lbs in that time.  He notes that he has historically not preferred to drink water as such, but hydrates with juice, coffee, and other drinks.  He notes that he believes that he is able to take care of himself adequately at home. He also notes that his cane is sufficient for helping him get around.   Most recent lab results (07/22/17) of CBC  is as follows: all values are WNL except for RBC at 2.94, Hgb at 8.7, HCT at 27.2, RDW at 26.5, Platelets at 134k. CBC from 10/30/16 revealed all values WNL.  CMP 07/21/17 revealed all values WNL except for Sodium at 134, Glucose at 101, Creatinine at 1.85, Calcium at 7.9, Total Protein at 5.7, Albumin at  2.7, Alk Phos at 433.  LDH 07/20/17 elevated at 256. Haptoglobin 07/20/17 elevated at 357.  Vitamin B12 07/06/17 is WNL at 229.   On review of systems, pt reports decreased appetite, losing 20 lbs over 4-5 months, bilateral thigh pain, fatigue, mild leg swelling, and denies fevers, chills, night sweats, bone pains, nausea, abdominal pains, bleeding, blood in the urine, blood in the stools, black stools, changes in bowel habits, light headednss, dizziness, abdominal pains, noticing any new lumps or bumps, testicular pain or swelling, and any other symptoms.   On PMHx the pt reports heart attack and stroke in 2012. On Social Hx the pt denies much ETOH consumption.   Interval History:  Mr. FDominic Mahaneyreturns today regarding his metastatic prostate cancer and myelopthisic anemia. The patient's last visit with [place] on 01/[place]/2019. The pt reports that he is doing well overall.  The pt reports that he has less and more infrequent hot flashes since beginning XLewis Pt is having b/l knee pain that is worsens with exercise. He notes that he has a low appetite but does have a few foods that he feels that he can eat at any time. Pt likes Boost supplements but sometimes uses them instead of eating a full meal. He has home care services that comes in to help him with daily tasks and cooking meals, in addition to his daughter. Pt is  not currently using Oxycodone due to it causing constipation and is only using the Fentanyl Patch. Pt denies any significant nausea or depression. He has continued to keep in contact with family via phone and socially distance himself for safety. Pt would like to see [place] in the afternoon going forward so that his aid is around to help.   Lab results today (04/01/19) of CBC w/diff and CMP is as follows: all values are WNL except for RBC at 3.09, Hgb at 9.7, HCT at 31.9, MCV at 103.2, Glucose at 101, BUN at 26, Creatinine at 2.00,GFR Est Non Af Am at 29. 04/01/2019 Prostate-Specific AG  Serum is in progress  On review of systems, pt reports improved hot flashes, knee pain, low appetite and denies fatigue, nausea, depression, abdominal pain, knee redness/swelling and any other symptoms.   MEDICAL HISTORY:  Past Medical History:  Diagnosis Date  . BPH associated with nocturia    flomax 0.4--> 0.8 mg trial. nocturia if has coffee. some incontinence  . CAD (coronary artery disease)    LAD Stent 2012. 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   . Gout    no rx. apparently 1x in past  . History of stroke    no aspirin before stroke. no deficits. slurred words at time of stroke  . Hyperlipidemia   . Hypertension     SURGICAL HISTORY: Past Surgical History:  Procedure Laterality Date  . CARDIAC SURGERY     Stint  . CORONARY STENT PLACEMENT    . left arm fracture s/p surgery    . right leg fracture s.p surgery- screws like arm      SOCIAL HISTORY: Social History   Socioeconomic History  . Marital status: Married    Spouse name: Not on file  . Number of children: Not on file  . Years of education: Not on file  . Highest education level: Not on file  Occupational History  . Not on file  Social Needs  . Financial resource strain: Not on file  . Food insecurity    Worry: Not on file    Inability: Not on file  . Transportation needs    Medical: Not on file    Non-medical: Not on file  Tobacco Use  . Smoking status: Former Smoker    Packs/day: 0.50    Years: 1.00    Pack years: 0.50    Types: Cigarettes    Quit date: 05/01/1952    Years since quitting: 66.9  . Smokeless tobacco: Never Used  Substance and Sexual Activity  . Alcohol use: No  . Drug use: No  . Sexual activity: Not on file  Lifestyle  . Physical activity    Days per week: Not on file    Minutes per session: Not on file  . Stress: Not on file  Relationships  . Social Herbalist on phone: Not on file    Gets together: Not on file    Attends religious  service: Not on file    Active member of club or organization: Not on file    Attends meetings of clubs or organizations: Not on file    Relationship status: Not on file  . Intimate partner violence    Fear of current or ex partner: Not on file    Emotionally abused: Not on file    Physically abused: Not on file    Forced sexual activity: Not on file  Other Topics Concern  .  Not on file  Social History Narrative   Married- lives separate from wife. 2 children. Daughter passed from heart attack.       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 Breast cancer Mother   . Heart disease Father   . Heart disease Brother        x2  . Prostate cancer Brother     ALLERGIES:  has No Known Allergies.  MEDICATIONS:  Current Outpatient Medications  Medication Sig Dispense Refill  . aspirin EC 81 MG tablet Take 1 tablet (81 mg total) by mouth daily. 90 tablet 3  . BIDIL 20-37.5 MG tablet TAKE 1 TABLET BY MOUTH TWICE DAILY 180 tablet 0  . calcium-vitamin D (OSCAL 500/200 D-3) 500-200 MG-UNIT tablet Take 1 tablet by mouth daily with breakfast. 30 tablet 2  . fentaNYL (DURAGESIC) 25 MCG/HR Place 1 patch onto the skin every 3 (three) days. 10 patch 0  . metoprolol succinate (TOPROL-XL) 25 MG 24 hr tablet TAKE 1 TABLET BY MOUTH EVERY DAY 90 tablet 0  . Multiple Vitamins-Minerals (MULTI COMPLETE PO) Take by mouth.    . nitroGLYCERIN (NITROSTAT) 0.4 MG SL tablet Place 1 tablet (0.4 mg total) under the tongue every 5 (five) minutes as needed for chest pain (3 maximum before seeking care). 30 tablet 0  . Omega-3 1000 MG CAPS Take 1 capsule by mouth daily.     Marland Kitchen oxyCODONE (OXY IR/ROXICODONE) 5 MG immediate release tablet Take 1 tablet (5 mg total) by mouth every 4 (four) hours as needed for severe pain. 90 tablet 0  . polyethylene glycol (MIRALAX) packet Take 17 g by mouth daily. 30 each 1  .  predniSONE (DELTASONE) 5 MG tablet 6 tab x 1 day, 5 tab x 1 day, 4 tab x 1 day, 3 tab x 1 day, 2 tab x 1 day, 1 tab x 1 day, stop 21 tablet 0  . senna-docusate (SENNA S) 8.6-50 MG tablet Take 2 tablets by mouth 2 (two) times daily. 120 tablet 2  . tamsulosin (FLOMAX) 0.4 MG CAPS capsule TAKE ONE CAPSULE BY MOUTH TWICE DAILY 180 capsule 3  . vitamin B-12 (CYANOCOBALAMIN) 1000 MCG tablet Take 1 tablet (1,000 mcg total) by mouth daily. 30 tablet 0  . Vitamin D, Ergocalciferol, (DRISDOL) 1.25 MG (50000 UT) CAPS capsule TAKE 1 CAPSULE BY MOUTH 2 TIMES A WEEK 24 capsule 3  . XTANDI 40 MG capsule TAKE 3 CAPSULES (120 MG TOTAL) BY MOUTH DAILY. 90 capsule 1   No current facility-administered medications for this visit.     REVIEW OF SYSTEMS:   A 10+ POINT REVIEW OF SYSTEMS WAS OBTAINED including neurology, dermatology, psychiatry, cardiac, respiratory, lymph, extremities, GI, GU, Musculoskeletal, constitutional, breasts, reproductive, HEENT.  All pertinent positives are noted in the HPI.  All others are negative.   PHYSICAL EXAMINATION:  There were no vitals filed for this visit. There were no vitals filed for this visit. .There is no height or weight on file to calculate BMI.   Exam was given in a wheel chair   GENERAL:alert, in no acute distress and comfortable SKIN: no acute rashes, no significant lesions EYES: conjunctiva are pink and non-injected, sclera anicteric OROPHARYNX: MMM, no exudates, no oropharyngeal erythema or ulceration NECK: supple, no JVD LYMPH:  no palpable lymphadenopathy in the cervical, axillary or inguinal regions LUNGS: clear to auscultation b/l with normal  respiratory effort HEART: regular rate & rhythm ABDOMEN:  normoactive bowel sounds , non tender, not distended. No palpable hepatosplenomegaly.  Extremity: no pedal edema PSYCH: alert & oriented x 3 with fluent speech NEURO: no focal motor/sensory deficits  LABORATORY DATA:  I have reviewed the data as listed   . CBC Latest Ref Rng & Units 04/01/2019 02/25/2019 01/28/2019  WBC 4.0 - 10.5 K/uL 4.3 4.9 5.1  Hemoglobin 13.0 - 17.0 g/dL 9.7(L) 10.3(L) 10.2(L)  Hematocrit 39.0 - 52.0 % 31.9(L) 32.7(L) 32.4(L)  Platelets 150 - 400 K/uL 212 219 199   . CBC    Component Value Date/Time   WBC 4.3 04/01/2019 1430   RBC 3.09 (L) 04/01/2019 1430   HGB 9.7 (L) 04/01/2019 1430   HGB 9.7 (L) 05/31/2018 1050   HCT 31.9 (L) 04/01/2019 1430   HCT 21.9 (L) 07/20/2017 2103   PLT 212 04/01/2019 1430   PLT 231 05/31/2018 1050   MCV 103.2 (H) 04/01/2019 1430   MCH 31.4 04/01/2019 1430   MCHC 30.4 04/01/2019 1430   RDW 14.2 04/01/2019 1430   LYMPHSABS 1.3 04/01/2019 1430   MONOABS 0.5 04/01/2019 1430   EOSABS 0.1 04/01/2019 1430   BASOSABS 0.0 04/01/2019 1430     . CMP Latest Ref Rng & Units 04/01/2019 02/25/2019 01/28/2019  Glucose 70 - 99 mg/dL 101(H) 105(H) 118(H)  BUN 8 - 23 mg/dL 26(H) 28(H) 29(H)  Creatinine 0.61 - 1.24 mg/dL 2.00(H) 2.12(H) 1.96(H)  Sodium 135 - 145 mmol/L 136 138 136  Potassium 3.5 - 5.1 mmol/L 4.7 4.8 4.6  Chloride 98 - 111 mmol/L 103 106 104  CO2 22 - 32 mmol/L _0 Calcium 8.9 - 10.3 mg/dL 8.9 8.7(L) 8.2(L)  Total Protein 6.5 - 8.1 g/dL 7.0 7.0 6.8  Total Bilirubin 0.3 - 1.2 mg/dL 0.7 0.7 0.6  Alkaline Phos 38 - 126 U/L 114 104 95  AST 15 - 41 U/L 17 14(L) 15  ALT 0 - 44 U/L _1 Component     Latest Ref Rng & Units 07/30/2017 09/06/2017 10/04/2017 10/31/2017  Prostate Specific Ag, Serum     0.0 - 4.0 ng/mL 1,186.0 (H) 433.8 (H) 67.4 (H) 55.8 (H)   07/31/17 BM Bx:   RADIOGRAPHIC STUDIES: I have personally reviewed the radiological images as listed and agreed with the findings in the report. No results found.  ASSESSMENT & PLAN:   85 y.o. male with  1. Normocytic Anemia - due to primarily metastatic prostatic cancer (ACD + BM involvement with prostate cancer causing myelopthisic picture)  -Pt presented with a Hgb at 8.7 on 07/22/17, improved to 10.3 after  receiving PRBC transfusion. -In review of the patient's previous CBC records, his anemia developed 6-7 months before presenting to care with me, unaccompanied by a drop in his EPO (07/20/17 elevated at 249.8);  LDH elevated  but haptoglobin at 357 on 07/20/17. -suggested against active hemolysis.  2. Recently diagnosed metastatic prostate cancer with extensive bone metastases. With bone mets/BM Mets and LNadenopathy. Exam positive for left retroperitoneal, bilateral pelvis and right inguinal adenopathy. Exam positive for left retroperitoneal, bilateral pelvis and right inguinal adenopathy. Diffuse sclerotic bone metastasis.   3. Elevated alkaline phosphatase due to bone metastases from prostate cancer -CT BM bx -concerning for significant bone lesions in lower spine and pelvis PSA levels have declined to 26.9  4. Cancer related pain - primarily in b/l thighs. -much improved.  PLAN:  -Discussed pt labwork today, 04/01/19; all  values are WNL except for RBC at 3.09, Hgb at 9.7, HCT at 31.9, MCV at 103.2, Glucose at 101, BUN at 26, Creatinine at 2.00,GFR Est Non Af Am at 29. -Discussed 04/01/2019 Prostate-Specific AG is in progress -Will follow up with results of Prostate-Specific AG  -Knees without focal signs of inflammation nor tenderness to palpation on physical exam -Recommended pt use Boost supplements in addition to other meals, not as a replacement -Continue Lupron injections every 12 weeks -Continue Xgeva every 4 weeks  -Continue 0.5m Tamsulosin BID -Continue on 877mAspirin -Continue Vitamin D and Calcium supplements. -Pt set up for transfusions prn for hgb<8 -Will increase Xtandi dosage; pt will alternate between 3-4 pills per day for 2 weeks, if that is tolerable will go to 4 pills per day -The pt has no prohibitive toxicities from continuing XtWinter Beacht this time.  -Will repeat PET/CT in 1 week  -If PET/CT shows localized progression may consider localized RT -Will see in 2  weeks via phone   FOLLOW UP: PET/CT auximin study in 1 week Phone visit with Dr KaIrene Limbon 2 weeks Continue Xgeva q4weeks Continue Lupron q12 weeks  The total time spent in the appt was 25 minutes and more than 50% was on counseling and direct patient cares.  All of the patient's questions were answered with apparent satisfaction. The patient knows to call the clinic with any problems, questions or concerns.   GaSullivan LoneD MSMeyerAHIVMS SCCoast Surgery CenterTPacific Endoscopy And Surgery Center LLCematology/Oncology Physician CoHowerton Surgical Center LLC(Office):       [phone] cell):  [phone]):           [phone]/05/2018 5:08 PM  I, JaYevette Edwardsam acting as a scribe for Dr. GaSullivan Lone  .I have reviewed the above documentation for accuracy and completeness, and I agree with the above. .GBrunetta GeneraD

## 2019-04-01 ENCOUNTER — Inpatient Hospital Stay (HOSPITAL_BASED_OUTPATIENT_CLINIC_OR_DEPARTMENT_OTHER): Payer: Medicare Other | Admitting: Hematology

## 2019-04-01 ENCOUNTER — Other Ambulatory Visit: Payer: Self-pay

## 2019-04-01 ENCOUNTER — Inpatient Hospital Stay: Payer: Medicare Other

## 2019-04-01 ENCOUNTER — Inpatient Hospital Stay: Payer: Medicare Other | Attending: Hematology

## 2019-04-01 VITALS — BP 152/65 | HR 81 | Temp 98.7°F | Resp 18

## 2019-04-01 DIAGNOSIS — C7951 Secondary malignant neoplasm of bone: Secondary | ICD-10-CM | POA: Diagnosis not present

## 2019-04-01 DIAGNOSIS — Z87891 Personal history of nicotine dependence: Secondary | ICD-10-CM | POA: Diagnosis not present

## 2019-04-01 DIAGNOSIS — C61 Malignant neoplasm of prostate: Secondary | ICD-10-CM

## 2019-04-01 DIAGNOSIS — I252 Old myocardial infarction: Secondary | ICD-10-CM | POA: Diagnosis not present

## 2019-04-01 DIAGNOSIS — G893 Neoplasm related pain (acute) (chronic): Secondary | ICD-10-CM

## 2019-04-01 DIAGNOSIS — K5900 Constipation, unspecified: Secondary | ICD-10-CM | POA: Diagnosis not present

## 2019-04-01 DIAGNOSIS — E785 Hyperlipidemia, unspecified: Secondary | ICD-10-CM | POA: Diagnosis not present

## 2019-04-01 DIAGNOSIS — I10 Essential (primary) hypertension: Secondary | ICD-10-CM | POA: Insufficient documentation

## 2019-04-01 DIAGNOSIS — Z79899 Other long term (current) drug therapy: Secondary | ICD-10-CM | POA: Insufficient documentation

## 2019-04-01 DIAGNOSIS — Z8673 Personal history of transient ischemic attack (TIA), and cerebral infarction without residual deficits: Secondary | ICD-10-CM | POA: Diagnosis not present

## 2019-04-01 DIAGNOSIS — D6182 Myelophthisis: Secondary | ICD-10-CM

## 2019-04-01 DIAGNOSIS — D649 Anemia, unspecified: Secondary | ICD-10-CM | POA: Insufficient documentation

## 2019-04-01 LAB — CBC WITH DIFFERENTIAL/PLATELET
Abs Immature Granulocytes: 0.03 10*3/uL (ref 0.00–0.07)
Basophils Absolute: 0 10*3/uL (ref 0.0–0.1)
Basophils Relative: 1 %
Eosinophils Absolute: 0.1 10*3/uL (ref 0.0–0.5)
Eosinophils Relative: 3 %
HCT: 31.9 % — ABNORMAL LOW (ref 39.0–52.0)
Hemoglobin: 9.7 g/dL — ABNORMAL LOW (ref 13.0–17.0)
Immature Granulocytes: 1 %
Lymphocytes Relative: 30 %
Lymphs Abs: 1.3 10*3/uL (ref 0.7–4.0)
MCH: 31.4 pg (ref 26.0–34.0)
MCHC: 30.4 g/dL (ref 30.0–36.0)
MCV: 103.2 fL — ABNORMAL HIGH (ref 80.0–100.0)
Monocytes Absolute: 0.5 10*3/uL (ref 0.1–1.0)
Monocytes Relative: 12 %
Neutro Abs: 2.3 10*3/uL (ref 1.7–7.7)
Neutrophils Relative %: 53 %
Platelets: 212 10*3/uL (ref 150–400)
RBC: 3.09 MIL/uL — ABNORMAL LOW (ref 4.22–5.81)
RDW: 14.2 % (ref 11.5–15.5)
WBC: 4.3 10*3/uL (ref 4.0–10.5)
nRBC: 0 % (ref 0.0–0.2)

## 2019-04-01 LAB — CMP (CANCER CENTER ONLY)
ALT: 7 U/L (ref 0–44)
AST: 17 U/L (ref 15–41)
Albumin: 3.6 g/dL (ref 3.5–5.0)
Alkaline Phosphatase: 114 U/L (ref 38–126)
Anion gap: 9 (ref 5–15)
BUN: 26 mg/dL — ABNORMAL HIGH (ref 8–23)
CO2: 24 mmol/L (ref 22–32)
Calcium: 8.9 mg/dL (ref 8.9–10.3)
Chloride: 103 mmol/L (ref 98–111)
Creatinine: 2 mg/dL — ABNORMAL HIGH (ref 0.61–1.24)
GFR, Est AFR Am: 34 mL/min — ABNORMAL LOW (ref >60)
GFR, Estimated: 29 mL/min — ABNORMAL LOW (ref >60)
Glucose, Bld: 101 mg/dL — ABNORMAL HIGH (ref 70–99)
Potassium: 4.7 mmol/L (ref 3.5–5.1)
Sodium: 136 mmol/L (ref 135–145)
Total Bilirubin: 0.7 mg/dL (ref 0.3–1.2)
Total Protein: 7 g/dL (ref 6.5–8.1)

## 2019-04-01 MED ORDER — DENOSUMAB 120 MG/1.7ML ~~LOC~~ SOLN
120.0000 mg | Freq: Once | SUBCUTANEOUS | Status: AC
  Administered 2019-04-01: 120 mg via SUBCUTANEOUS

## 2019-04-01 MED ORDER — DENOSUMAB 120 MG/1.7ML ~~LOC~~ SOLN
SUBCUTANEOUS | Status: AC
  Filled 2019-04-01: qty 1.7

## 2019-04-01 NOTE — Patient Instructions (Signed)
Denosumab injection What is this medicine? DENOSUMAB (den oh sue mab) slows bone breakdown. Prolia is used to treat osteoporosis in women after menopause and in men, and in people who are taking corticosteroids for 6 months or more. Xgeva is used to treat a high calcium level due to cancer and to prevent bone fractures and other bone problems caused by multiple myeloma or cancer bone metastases. Xgeva is also used to treat giant cell tumor of the bone. This medicine may be used for other purposes; ask your health care provider or pharmacist if you have questions. COMMON BRAND NAME(S): Prolia, XGEVA What should I tell my health care provider before I take this medicine? They need to know if you have any of these conditions:  dental disease  having surgery or tooth extraction  infection  kidney disease  low levels of calcium or Vitamin D in the blood  malnutrition  on hemodialysis  skin conditions or sensitivity  thyroid or parathyroid disease  an unusual reaction to denosumab, other medicines, foods, dyes, or preservatives  pregnant or trying to get pregnant  breast-feeding How should I use this medicine? This medicine is for injection under the skin. It is given by a health care professional in a hospital or clinic setting. A special MedGuide will be given to you before each treatment. Be sure to read this information carefully each time. For Prolia, talk to your pediatrician regarding the use of this medicine in children. Special care may be needed. For Xgeva, talk to your pediatrician regarding the use of this medicine in children. While this drug may be prescribed for children as young as 13 years for selected conditions, precautions do apply. Overdosage: If you think you have taken too much of this medicine contact a poison control center or emergency room at once. NOTE: This medicine is only for you. Do not share this medicine with others. What if I miss a dose? It is  important not to miss your dose. Call your doctor or health care professional if you are unable to keep an appointment. What may interact with this medicine? Do not take this medicine with any of the following medications:  other medicines containing denosumab This medicine may also interact with the following medications:  medicines that lower your chance of fighting infection  steroid medicines like prednisone or cortisone This list may not describe all possible interactions. Give your health care provider a list of all the medicines, herbs, non-prescription drugs, or dietary supplements you use. Also tell them if you smoke, drink alcohol, or use illegal drugs. Some items may interact with your medicine. What should I watch for while using this medicine? Visit your doctor or health care professional for regular checks on your progress. Your doctor or health care professional may order blood tests and other tests to see how you are doing. Call your doctor or health care professional for advice if you get a fever, chills or sore throat, or other symptoms of a cold or flu. Do not treat yourself. This drug may decrease your body's ability to fight infection. Try to avoid being around people who are sick. You should make sure you get enough calcium and vitamin D while you are taking this medicine, unless your doctor tells you not to. Discuss the foods you eat and the vitamins you take with your health care professional. See your dentist regularly. Brush and floss your teeth as directed. Before you have any dental work done, tell your dentist you are   receiving this medicine. Do not become pregnant while taking this medicine or for 5 months after stopping it. Talk with your doctor or health care professional about your birth control options while taking this medicine. Women should inform their doctor if they wish to become pregnant or think they might be pregnant. There is a potential for serious side  effects to an unborn child. Talk to your health care professional or pharmacist for more information. What side effects may I notice from receiving this medicine? Side effects that you should report to your doctor or health care professional as soon as possible:  allergic reactions like skin rash, itching or hives, swelling of the face, lips, or tongue  bone pain  breathing problems  dizziness  jaw pain, especially after dental work  redness, blistering, peeling of the skin  signs and symptoms of infection like fever or chills; cough; sore throat; pain or trouble passing urine  signs of low calcium like fast heartbeat, muscle cramps or muscle pain; pain, tingling, numbness in the hands or feet; seizures  unusual bleeding or bruising  unusually weak or tired Side effects that usually do not require medical attention (report to your doctor or health care professional if they continue or are bothersome):  constipation  diarrhea  headache  joint pain  loss of appetite  muscle pain  runny nose  tiredness  upset stomach This list may not describe all possible side effects. Call your doctor for medical advice about side effects. You may report side effects to FDA at 1-800-FDA-1088. Where should I keep my medicine? This medicine is only given in a clinic, doctor's office, or other health care setting and will not be stored at home. NOTE: This sheet is a summary. It may not cover all possible information. If you have questions about this medicine, talk to your doctor, pharmacist, or health care provider.  2020 Elsevier/Gold Standard (2017-08-24 16:10:44)

## 2019-04-02 ENCOUNTER — Telehealth: Payer: Self-pay | Admitting: *Deleted

## 2019-04-02 ENCOUNTER — Telehealth: Payer: Self-pay | Admitting: Hematology

## 2019-04-02 LAB — PROSTATE-SPECIFIC AG, SERUM (LABCORP): Prostate Specific Ag, Serum: 79.1 ng/mL — ABNORMAL HIGH (ref 0.0–4.0)

## 2019-04-02 NOTE — Telephone Encounter (Signed)
Scheduled appt 12/1 los.  Spoke with pt daughter and she is aware of the appt date and time.  Readjusted appts per daughter request.

## 2019-04-02 NOTE — Telephone Encounter (Signed)
Daughter [name] called - her father saw Dr.kale yesterday (12/1) and was unsure about medication changes and PET scan ordered.  Reviewed MD OV note regarding scan and Xtandi: "Will increase Xtandi dosage; pt will alternate between 3-4 pills per day for 2 weeks, if that is tolerable will go to 4 pills per day". Daughter states she sets up his medicine by the week and will make appropriate changes. Informed her that Central Scheduling will contact her to schedule PET scan. Daughter expressed concerns related to her father's short term memory, stating she wondered if cancer had spread to brain. She said he could not tell her what was discussed in appt with Dr.Kale yesterday except to say that the doctor talked about different treatments.  States father asks questions about information given and then does not remember what has been said to him. She does not think he should be in appts alone. Informed her that next appt with Dr. [name] is phone appt and she will be able to ask questions if available. She states she will be there.  Encouraged her to have PCP evaluate patient. Dr. [name] informed.

## 2019-04-08 ENCOUNTER — Telehealth: Payer: Self-pay | Admitting: *Deleted

## 2019-04-08 NOTE — Telephone Encounter (Signed)
Contacted by Earnest Bailey, pharmacist with [facility]. She was contacted by [name] - patient's daughter. Xtandi had been increased by Dr. [name] to 4 tabs alternating with 3 tabs daily. Father had fatigue, extreme hot flashes and generally did not feel well after dose increased. Holly wanted MD to know of med side effect and to know if MD was continuing alternating 3/4 tablet dosing. Dr. [name] informed.  Per Dr. [name], inform patient's daughter: Based on patient experience, will return to 3 tablets a day only. When medication runs out, do not refill as MD may change medication following PET scan results when he speaks with patient on 12/17. Ok for patient to be without med for a few days if med runs out.  [facility] with this information. She verbalized understanding.

## 2019-04-09 ENCOUNTER — Other Ambulatory Visit: Payer: Self-pay | Admitting: *Deleted

## 2019-04-09 MED ORDER — FENTANYL 25 MCG/HR TD PT72: 1.0000 | MEDICATED_PATCH | TRANSDERMAL | 0 refills | Status: DC

## 2019-04-09 NOTE — Telephone Encounter (Signed)
Daughter requested refill of Fentanyl patch

## 2019-04-10 ENCOUNTER — Other Ambulatory Visit: Payer: Self-pay | Admitting: Family Medicine

## 2019-04-15 ENCOUNTER — Other Ambulatory Visit: Payer: Self-pay

## 2019-04-15 ENCOUNTER — Ambulatory Visit (HOSPITAL_COMMUNITY)
Admission: RE | Admit: 2019-04-15 | Discharge: 2019-04-15 | Disposition: A | Discharge status: Home / Self Care | Payer: Medicare Other | Source: Ambulatory Visit | Attending: Hematology | Admitting: Hematology

## 2019-04-15 DIAGNOSIS — C7951 Secondary malignant neoplasm of bone: Secondary | ICD-10-CM | POA: Insufficient documentation

## 2019-04-15 DIAGNOSIS — C61 Malignant neoplasm of prostate: Secondary | ICD-10-CM | POA: Insufficient documentation

## 2019-04-15 MED ORDER — AXUMIN (FLUCICLOVINE F 18) INJECTION
10.2800 | Freq: Once | INTRAVENOUS | Status: AC
  Administered 2019-04-15: 10.28 via INTRAVENOUS

## 2019-04-16 NOTE — Progress Notes (Signed)
HEMATOLOGY/ONCOLOGY CLINIC NOTE  Date of Service: 04/17/19    Patient Care Team: Tony Barrack, MD as PCP - General (Family Medicine)  CHIEF COMPLAINTS/PURPOSE OF CONSULTATION:  -mx metastatic prostate cancer with  Myelopthisic anemia  HISTORY OF PRESENTING ILLNESS:   Tony [name] is a wonderful 85 y.o. male who has been referred to [place] by Dr Tony [name] for evaluation and management of anemia. He is accompanied today by his daughter. The pt reports that he is doing well overall.   The pt reports a new onset of fatigue that began in January 2019. His daughter notes being able to tell a difference in his energy levels as early as November 2018. He notes that some cramping pain in his thighs. He notes that his recent 07/23/17 blood transfusion has led to a "terrific, immediate change" in his thigh pain. However, his thigh pain has returned in the last couple days.  He notes that he takes 1039mg Vitamin B12 daily.   He notes that for 6-7 years he took iron pills after his 2012 heart attack and stroke. He notes that he took a statin for 6-7 months and was taken off of it recently. He notes no unresolved symptoms from his stroke.   The pt notes that over the last 4-5 months his appetite has diminished and he has subsequently lost about 20 lbs in that time.  He notes that he has historically not preferred to drink water as such, but hydrates with juice, coffee, and other drinks.  He notes that he believes that he is able to take care of himself adequately at home. He also notes that his cane is sufficient for helping him get around.   Most recent lab results (07/22/17) of CBC  is as follows: all values are WNL except for RBC at 2.94, Hgb at 8.7, HCT at 27.2, RDW at 26.5, Platelets at 134k. CBC from 10/30/16 revealed all values WNL.  CMP 07/21/17 revealed all values WNL except for Sodium at 134, Glucose at 101, Creatinine at 1.85, Calcium at 7.9, Total Protein at 5.7, Albumin at 2.7, Alk  Phos at 433.  LDH 07/20/17 elevated at 256. Haptoglobin 07/20/17 elevated at 357.  Vitamin B12 07/06/17 is WNL at 229.   On review of systems, pt reports decreased appetite, losing 20 lbs over 4-5 months, bilateral thigh pain, fatigue, mild leg swelling, and denies fevers, chills, night sweats, bone pains, nausea, abdominal pains, bleeding, blood in the urine, blood in the stools, black stools, changes in bowel habits, light headednss, dizziness, abdominal pains, noticing any new lumps or bumps, testicular pain or swelling, and any other symptoms.   On PMHx the pt reports heart attack and stroke in 2012. On Social Hx the pt denies much ETOH consumption.   Interval History:  I connected with Tony Nearingon 04/17/2019 at  2:20 PM EST by TeleHealth and verified that I am speaking with the correct person using two identifiers.   I discussed the limitations, risks, security and privacy concerns of performing an evaluation and management service by telemedicine and the availability of in-person appointments. I also discussed with the patient that there may be a patient responsible charge related to this service. The patient expressed understanding and agreed to proceed.   Other persons participating in the visit and their role in the encounter: Tony [name] his daughter Tony [name]  Patient's location: Home  Provider's location: [place]  Chief Complaint: metastatic prostate cancer and  myelopthisic anemia  [name] returns today regarding his metastatic prostate cancer and myelopthisic anemia. The patient's last visit with [place] was on 04/01/2019. The pt reports that he is doing well overall.  The pt reports doing okay and experiencing some knee pain.    Of note since the patient's last visit, pt has had NM PET (AXUMIN) [name] (Accession [id number]) completed on 04/15/2019 with results revealing "1. Marked response to therapy since prior chest CT of 08/08/2017 and  abdominopelvic CT of 08/06/2017. 2. No hypermetabolic soft tissue metastasis. 3. Nonspecific moderate hypermetabolism within the prostate. Cannot exclude residual localized disease.4. Diffuse sclerotic osseous metastasis, primarily non FDG avid. Areas of mild osseous hypermetabolism as detailed above suspicious for active disease."  Lab results today (04/01/19) of CBC w/diff and CMP is as follows: all values are WNL except for RBC at 3.09, Hemoglobin at 9.  On review of systems, pt reports knee pain, improvement in his appetite and denies new symptoms and any other symptoms.   MEDICAL HISTORY:  Past Medical History:  Diagnosis Date  . BPH associated with nocturia    flomax 0.4--> 0.8 mg trial. nocturia if has coffee. some incontinence  . CAD (coronary artery disease)    LAD Stent 2012. 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   . Gout    no rx. apparently 1x in past  . History of stroke    no aspirin before stroke. no deficits. slurred words at time of stroke  . Hyperlipidemia   . Hypertension     SURGICAL HISTORY: Past Surgical History:  Procedure Laterality Date  . CARDIAC SURGERY     Stint  . CORONARY STENT PLACEMENT    . left arm fracture s/p surgery    . right leg fracture s.p surgery- screws like arm      SOCIAL HISTORY: Social History   Socioeconomic History  . Marital status: Married    Spouse name: Not on file  . Number of children: Not on file  . Years of education: Not on file  . Highest education level: Not on file  Occupational History  . Not on file  Tobacco Use  . Smoking status: Former Smoker    Packs/day: 0.50    Years: 1.00    Pack years: 0.50    Types: Cigarettes    Quit date: 05/01/1952    Years since quitting: 67.0  . Smokeless tobacco: Never Used  Substance and Sexual Activity  . Alcohol use: No  . Drug use: No  . Sexual activity: Not on file  Other Topics Concern  . Not on file  Social History Narrative   Married- lives  separate from wife. 2 children. Daughter passed from heart attack.       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 Difficulty of Paying Living Expenses: Not on file  Food Insecurity:   . Worried About Charity fundraiser in the Last Year: Not on file  . Ran Out of Food in the Last Year: Not on file  Transportation Needs:   . Lack of Transportation (Medical): Not on file  . Lack of Transportation (Non-Medical): Not on file  Physical Activity:   . Days of Exercise per Week: Not on file  . Minutes of Exercise per Session: Not on file  Stress:   . Feeling  of Stress : Not on file  Social Connections:   . Frequency of Communication with Friends and Family: Not on file  . Frequency of Social Gatherings with Friends and Family: Not on file  . Attends Religious Services: Not on file  . Active Member of Clubs or Organizations: Not on file  . Attends Archivist Meetings: Not on file  . Marital Status: Not on file  Intimate Partner Violence:   . Fear of Current or Ex-Partner: Not on file  . Emotionally Abused: Not on file  . Physically Abused: Not on file  . Sexually Abused: Not on file    FAMILY HISTORY: Family History  Problem Relation Age of Onset  . Breast cancer Mother   . Heart disease Father   . Heart disease Brother        x2  . Prostate cancer Brother     ALLERGIES:  has No Known Allergies.  MEDICATIONS:  Current Outpatient Medications  Medication Sig Dispense Refill  . aspirin EC 81 MG tablet Take 1 tablet (81 mg total) by mouth daily. 90 tablet 3  . BIDIL 20-37.5 MG tablet TAKE 1 TABLET BY MOUTH TWICE DAILY 180 tablet 0  . calcium-vitamin D (OSCAL 500/200 D-3) 500-200 MG-UNIT tablet Take 1 tablet by mouth daily with breakfast. 30 tablet 2  . fentaNYL (DURAGESIC) 25 MCG/HR Place 1 patch onto the skin every 3 (three) days. 10 patch 0  . metoprolol succinate  (TOPROL-XL) 25 MG 24 hr tablet TAKE 1 TABLET BY MOUTH EVERY DAY 90 tablet 0  . Multiple Vitamins-Minerals (MULTI COMPLETE PO) Take by mouth.    . nitroGLYCERIN (NITROSTAT) 0.4 MG SL tablet Place 1 tablet (0.4 mg total) under the tongue every 5 (five) minutes as needed for chest pain (3 maximum before seeking care). 30 tablet 0  . Omega-3 1000 MG CAPS Take 1 capsule by mouth daily.     Marland Kitchen oxyCODONE (OXY IR/ROXICODONE) 5 MG immediate release tablet Take 1 tablet (5 mg total) by mouth every 4 (four) hours as needed for severe pain. 90 tablet 0  . polyethylene glycol (MIRALAX) packet Take 17 g by mouth daily. 30 each 1  . predniSONE (DELTASONE) 5 MG tablet 6 tab x 1 day, 5 tab x 1 day, 4 tab x 1 day, 3 tab x 1 day, 2 tab x 1 day, 1 tab x 1 day, stop 21 tablet 0  . senna-docusate (SENNA S) 8.6-50 MG tablet Take 2 tablets by mouth 2 (two) times daily. 120 tablet 2  . tamsulosin (FLOMAX) 0.4 MG CAPS capsule TAKE ONE CAPSULE BY MOUTH TWICE DAILY 180 capsule 3  . vitamin B-12 (CYANOCOBALAMIN) 1000 MCG tablet Take 1 tablet (1,000 mcg total) by mouth daily. 30 tablet 0  . Vitamin D, Ergocalciferol, (DRISDOL) 1.25 MG (50000 UT) CAPS capsule TAKE 1 CAPSULE BY MOUTH 2 TIMES A WEEK 24 capsule 3  . XTANDI 40 MG capsule TAKE 3 CAPSULES (120 MG TOTAL) BY MOUTH DAILY. 90 capsule 1   No current facility-administered medications for this visit.    REVIEW OF SYSTEMS:   A 10+ POINT REVIEW OF SYSTEMS WAS OBTAINED including neurology, dermatology, psychiatry, cardiac, respiratory, lymph, extremities, GI, GU, Musculoskeletal, constitutional, breasts, reproductive, HEENT.  All pertinent positives are noted in the HPI.  All others are negative.   PHYSICAL EXAMINATION: There were no vitals filed for this visit. Wt Readings from Last 3 Encounters:  01/28/19 172 lb 12.8 oz (78.4 kg)  12/03/18 173 lb 8 oz (  78.7 kg)  11/15/18 172 lb 11.2 oz (78.3 kg)   There is no height or weight on file to calculate BMI.     Telehealth  Visit 04/17/19   LABORATORY DATA:  I have reviewed the data as listed  . CBC Latest Ref Rng & Units 04/01/2019 02/25/2019 01/28/2019  WBC 4.0 - 10.5 K/uL 4.3 4.9 5.1  Hemoglobin 13.0 - 17.0 g/dL 9.7(L) 10.3(L) 10.2(L)  Hematocrit 39.0 - 52.0 % 31.9(L) 32.7(L) 32.4(L)  Platelets 150 - 400 K/uL 212 219 199   . CBC    Component Value Date/Time   WBC 4.3 04/01/2019 1430   RBC 3.09 (L) 04/01/2019 1430   HGB 9.7 (L) 04/01/2019 1430   HGB 9.7 (L) 05/31/2018 1050   HCT 31.9 (L) 04/01/2019 1430   HCT 21.9 (L) 07/20/2017 2103   PLT 212 04/01/2019 1430   PLT 231 05/31/2018 1050   MCV 103.2 (H) 04/01/2019 1430   MCH 31.4 04/01/2019 1430   MCHC 30.4 04/01/2019 1430   RDW 14.2 04/01/2019 1430   LYMPHSABS 1.3 04/01/2019 1430   MONOABS 0.5 04/01/2019 1430   EOSABS 0.1 04/01/2019 1430   BASOSABS 0.0 04/01/2019 1430     . CMP Latest Ref Rng & Units 04/01/2019 02/25/2019 01/28/2019  Glucose 70 - 99 mg/dL 101(H) 105(H) 118(H)  BUN 8 - 23 mg/dL 26(H) 28(H) 29(H)  Creatinine 0.61 - 1.24 mg/dL 2.00(H) 2.12(H) 1.96(H)  Sodium 135 - 145 mmol/L 136 138 136  Potassium 3.5 - 5.1 mmol/L 4.7 4.8 4.6  Chloride 98 - 111 mmol/L 103 106 104  CO2 22 - 32 mmol/L '24 23 22  '$ Calcium 8.9 - 10.3 mg/dL 8.9 8.7(L) 8.2(L)  Total Protein 6.5 - 8.1 g/dL 7.0 7.0 6.8  Total Bilirubin 0.3 - 1.2 mg/dL 0.7 0.7 0.6  Alkaline Phos 38 - 126 U/L 114 104 95  AST 15 - 41 U/L 17 14(L) 15  ALT 0 - 44 U/L '7 6 6    '$ 04/15/2019 NM PET (AXUMIN) SKULL BASE TO MID THIGH (Accession [id number]):    07/31/17 BM Bx:   RADIOGRAPHIC STUDIES: I have personally reviewed the radiological images as listed and agreed with the findings in the report. 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 Post systemic therapy. Increasing PSA. PSA 79.1 on 04/01/2019. EXAM: NUCLEAR MEDICINE PET SKULL BASE TO THIGH TECHNIQUE: 10.3 mCi F-18  Fluciclovine was injected intravenously. Full-ring PET imaging was performed from the skull base to thigh after the radiotracer. CT data was obtained and used for attenuation correction and anatomic localization. COMPARISON:  Bone scan 08/13/2017. Diagnostic chest CT 08/08/2017. Abdominopelvic CT 08/06/2017 FINDINGS: NECK No radiotracer activity in neck lymph nodes. The previously described left supraclavicular node is decreased in size and not hypermetabolic. Example at 9 mm on 50/4 versus 1.6 cm on 08/08/2017 CT (when remeasured). Incidental CT finding: Cerebral atrophy. CHEST No pulmonary parenchymal or thoracic nodal hypermetabolism. Incidental CT finding: Aortic and coronary artery atherosclerosis. Mild cardiomegaly. ABDOMEN/PELVIS Prostate: Diffuse moderate prostate hypermetabolism, which is homogeneous at a S.U.V. max of 4.7. Lymph nodes: No abdominopelvic nodal hypermetabolism. Liver: No evidence of liver metastasis Incidental CT finding: Resolution of previously described abdominal adenopathy. A left periaortic node measures 8 mm on 129/4 and is at the site of a 4.2 x 3.1 cm nodal mass on the prior. The marked right inguinal adenopathy has also resolved, with an index 7 mm node at the  site of a 3.0 cm node on the prior. Aortic atherosclerosis. Mild renal cortical thinning bilaterally. Colonic stool burden suggests constipation. Scattered colonic diverticula. Small bilateral fat containing inguinal hernias. SKELETON Areas of mild osseous hypermetabolism. Example within the right ischial tuberosity at a S.U.V. max of 4.1. Within the left femoral head at a S.U.V. max of 3.2. Diffuse sclerotic osseous metastasis. Right proximal femur fixation. Proximal left humerus fixation. Incompletely healed proximal right clavicular fracture. IMPRESSION: 1. Marked response to therapy since prior chest CT of 08/08/2017 and abdominopelvic CT of 08/06/2017. 2. No hypermetabolic soft tissue metastasis. 3. Nonspecific moderate  hypermetabolism within the prostate. Cannot exclude residual localized disease. 4. Diffuse sclerotic osseous metastasis, primarily non FDG avid. Areas of mild osseous hypermetabolism as detailed above suspicious for active disease. Electronically Signed   By: Abigail Miyamoto M.D.   On: 04/16/2019 12:19    ASSESSMENT & PLAN:   85 y.o. male with  1. Normocytic Anemia - due to primarily metastatic prostatic cancer (ACD + BM involvement with prostate cancer causing myelopthisic picture)  -Pt presented with a Hgb at 8.7 on 07/22/17, improved to 10.3 after receiving PRBC transfusion. -In review of the patient's previous CBC records, his anemia developed 6-7 months before presenting to care with me, unaccompanied by a drop in his EPO (07/20/17 elevated at 249.8);  LDH elevated  but haptoglobin at 357 on 07/20/17. -suggested against active hemolysis.  2. Metastatic prostate cancer with extensive bone metastases. With bone mets/BM Mets and LNadenopathy. Exam positive for left retroperitoneal, bilateral pelvis and right inguinal adenopathy. Exam positive for left retroperitoneal, bilateral pelvis and right inguinal adenopathy. Diffuse sclerotic bone metastasis.   3. Elevated alkaline phosphatase due to bone metastases from prostate cancer -CT BM bx -concerning for significant bone lesions in lower spine and pelvis PSA levels have declined to 26.9  4. Cancer related pain - primarily in b/l thighs. -much improved.  PLAN:  -Discussed pt labwork today, 04/01/19;  all values are WNL except for RBC at 3.09, Hemoglobin at 9. -NM PET (AXUMIN) SKULL BASE TO MID THIGH (Accession [id number]) completed on 04/15/2019 with results revealing "1. Marked response to therapy since prior chest CT of 08/08/2017 and abdominopelvic CT of 08/06/2017. 2. No hypermetabolic soft tissue metastasis. 3. Nonspecific moderate hypermetabolism within the prostate. Cannot exclude residual localized disease.4. Diffuse sclerotic osseous  metastasis, primarily non FDG avid. Areas of mild osseous hypermetabolism as detailed above suspicious for active disease." -Discussed Prostate Specific Ag, Serum 04/01/2019 at 79.1. -Discussed that knee pain could be from previous cancer but none was detected in recent scan. It could also be due to arthritis .  He would like a referral for physical therapist. Advised patient that he has previously had and completed physical therapy. -Discussed switching from [place] to Abiraterone since he was unable to tolerate full dose of Xtandi and has progressive PSA elevation on current dose of Xtandi @ 120 mg po daily.   Advised that we will start with a half of the normal dose ('500mg'$  of Abiraterone + Prendisone) to see how he tolerates the new medication. -Discussed that his blood counts, LFts  will need to monitored. -Advised that lupron shots will continue    FOLLOW UP: RTC with Dr [name] with labs in 3 weeks  -continue Xgeva q4weeks   The total time spent in the appt was 30 minutes and more than 50% was on counseling and direct patient cares.  All of the patient's questions were answered with apparent satisfaction. The patient  knows to call the clinic with any problems, questions or concerns.    Sullivan Lone MD MS AAHIVMS [facility] Hematology/Oncology Physician [facility]  (Office):       [phone] (Work cell):  [phone] (Fax):           [phone]  04/16/2019 5:02 PM  I, Scot Dock, am acting as a scribe for Dr. Sullivan Lone.   .I have reviewed the above documentation for accuracy and completeness, and I agree with the above. Brunetta Genera MD

## 2019-04-17 ENCOUNTER — Inpatient Hospital Stay (HOSPITAL_BASED_OUTPATIENT_CLINIC_OR_DEPARTMENT_OTHER): Payer: Medicare Other | Admitting: Hematology

## 2019-04-17 DIAGNOSIS — D6182 Myelophthisis: Secondary | ICD-10-CM

## 2019-04-17 DIAGNOSIS — C7951 Secondary malignant neoplasm of bone: Secondary | ICD-10-CM | POA: Diagnosis not present

## 2019-04-17 DIAGNOSIS — C61 Malignant neoplasm of prostate: Secondary | ICD-10-CM

## 2019-04-17 DIAGNOSIS — Z87891 Personal history of nicotine dependence: Secondary | ICD-10-CM | POA: Diagnosis not present

## 2019-04-17 DIAGNOSIS — I252 Old myocardial infarction: Secondary | ICD-10-CM | POA: Diagnosis not present

## 2019-04-17 DIAGNOSIS — D649 Anemia, unspecified: Secondary | ICD-10-CM | POA: Diagnosis not present

## 2019-04-17 DIAGNOSIS — Z79899 Other long term (current) drug therapy: Secondary | ICD-10-CM | POA: Diagnosis not present

## 2019-04-17 DIAGNOSIS — Z8673 Personal history of transient ischemic attack (TIA), and cerebral infarction without residual deficits: Secondary | ICD-10-CM | POA: Diagnosis not present

## 2019-04-17 DIAGNOSIS — I10 Essential (primary) hypertension: Secondary | ICD-10-CM | POA: Diagnosis not present

## 2019-04-17 DIAGNOSIS — K5900 Constipation, unspecified: Secondary | ICD-10-CM | POA: Diagnosis not present

## 2019-04-17 DIAGNOSIS — E785 Hyperlipidemia, unspecified: Secondary | ICD-10-CM | POA: Diagnosis not present

## 2019-04-17 MED ORDER — SENNOSIDES-DOCUSATE SODIUM 8.6-50 MG PO TABS: 2.0000 | ORAL_TABLET | Freq: Two times a day (BID) | ORAL | 2 refills | Status: DC

## 2019-04-18 ENCOUNTER — Telehealth: Payer: Self-pay | Admitting: Hematology

## 2019-04-18 NOTE — Telephone Encounter (Signed)
No los per 12/17.

## 2019-04-21 MED ORDER — ABIRATERONE ACETATE 250 MG PO TABS: 500.0000 mg | ORAL_TABLET | Freq: Every day | ORAL | 0 refills | Status: DC

## 2019-04-21 MED ORDER — PREDNISONE 5 MG PO TABS: 5.0000 mg | ORAL_TABLET | Freq: Two times a day (BID) | ORAL | 3 refills | Status: DC

## 2019-04-22 ENCOUNTER — Telehealth: Payer: Self-pay | Admitting: Pharmacist

## 2019-04-22 ENCOUNTER — Ambulatory Visit: Payer: Medicare Other

## 2019-04-22 ENCOUNTER — Telehealth: Payer: Self-pay

## 2019-04-22 ENCOUNTER — Other Ambulatory Visit: Payer: Medicare Other

## 2019-04-22 MED FILL — predniSONE 5 MG TABS: 5 | 30 days supply | Qty: 60 | Fill #0

## 2019-04-22 NOTE — Telephone Encounter (Signed)
Oral Oncology Pharmacist Encounter  Received new prescription for Zytiga (abiraterone) for the treatment of metastatic prostate cancer in conjunction with prednisone and Lupron, planned duration until disease progression or unacceptable drug toxicity.   CMP from 04/01/2019 assessed, no relevant lab abnormalities. BP from 04/01/2019 elevated, recommend continued BP monitoring , patient may need further optimization of HTN medication. Prescription dose and frequency assessed. MD starting the patient on a reduced dose of 500mg .  Current medication list in Epic reviewed, a few DDIs with abiraterone identified: -Abiraterone may increase the concentration of tamsulosin and metoprolol, monitor patient for increase adverse events related to increase exposure to tamsulosin and metoprolol.  Prescription has been e-scribed to the [facility] for benefits analysis and approval.  Oral Oncology Clinic will continue to follow for insurance authorization, copayment issues, initial counseling and start date.  Darl Pikes, PharmD, BCPS, [facility] Hematology/Oncology Clinical Pharmacist [facility] [place] [facility] [phone]  04/22/2019 11:01 AM

## 2019-04-22 NOTE — Telephone Encounter (Signed)
Oral Oncology Patient Advocate Encounter  Received notification from [employer] that prior authorization for Zytiga is required.  PA submitted on CoverMyMeds Key [id number] Status is pending  Oral Oncology Clinic will continue to follow.  [place] Patient [place] Phone [phone] Fax [phone] 04/22/2019 10:51 AM

## 2019-04-22 NOTE — Telephone Encounter (Signed)
Oral Oncology Patient Advocate Encounter  Prior Authorization for Tony [name] has been approved.    PA# [id number] Effective dates: 04/22/19 through 04/30/20  Patients co-pay is $0  Oral Oncology Clinic will continue to follow.   [place] Patient [place] Phone [phone] Fax [phone] 04/22/2019 11:10 AM

## 2019-04-28 MED FILL — ABIRATERONE ACETATE 250 MG: 250 | 30 days supply | Qty: 60 | Fill #0

## 2019-04-28 NOTE — Telephone Encounter (Signed)
Oral Chemotherapy Pharmacist Encounter  Medication will be deliver by [facility] on 04/30/2019.  Patient Education I spoke with patient's daughter [name] for overview of new oral chemotherapy medication: Zytiga (abiraterone) for the treatment of metastatic prostate cancer in conjunction with prednisone and Lupron, planned duration until disease progression or unacceptable drug toxicity.   Counseled patient on administration, dosing, side effects, monitoring, drug-food interactions, safe handling, storage, and disposal. Patient will take 2 tablets (500 mg total) by mouth daily. Take on an empty stomach 1 hour before or 2 hours after a meal.  Side effects include but not limited to: HTN, hot flashes, fatigue.    Reviewed with patient importance of keeping a medication schedule and plan for any missed doses.  [name] voiced understanding and appreciation. All questions answered. Medication handout placed in the mail.  Provided patient with Oral [place] [facility] phone number. Patient knows to call the office with questions or concerns. Oral Chemotherapy Navigation Clinic will continue to follow.  Darl Pikes, PharmD, BCPS, [facility] Hematology/Oncology Clinical Pharmacist [facility] Oral [place] [facility] [phone]  04/28/2019 3:04 PM

## 2019-04-29 ENCOUNTER — Other Ambulatory Visit: Payer: Medicare Other

## 2019-04-29 ENCOUNTER — Ambulatory Visit: Payer: Medicare Other

## 2019-05-06 ENCOUNTER — Other Ambulatory Visit: Payer: Self-pay

## 2019-05-06 ENCOUNTER — Inpatient Hospital Stay: Payer: Medicare Other | Attending: Hematology

## 2019-05-06 ENCOUNTER — Inpatient Hospital Stay: Payer: Medicare Other

## 2019-05-06 VITALS — BP 132/64 | HR 78 | Temp 98.7°F | Resp 18

## 2019-05-06 DIAGNOSIS — Z87891 Personal history of nicotine dependence: Secondary | ICD-10-CM | POA: Diagnosis not present

## 2019-05-06 DIAGNOSIS — E785 Hyperlipidemia, unspecified: Secondary | ICD-10-CM | POA: Diagnosis not present

## 2019-05-06 DIAGNOSIS — I252 Old myocardial infarction: Secondary | ICD-10-CM | POA: Insufficient documentation

## 2019-05-06 DIAGNOSIS — K5900 Constipation, unspecified: Secondary | ICD-10-CM | POA: Insufficient documentation

## 2019-05-06 DIAGNOSIS — C61 Malignant neoplasm of prostate: Secondary | ICD-10-CM

## 2019-05-06 DIAGNOSIS — C7951 Secondary malignant neoplasm of bone: Secondary | ICD-10-CM | POA: Insufficient documentation

## 2019-05-06 DIAGNOSIS — D649 Anemia, unspecified: Secondary | ICD-10-CM | POA: Diagnosis not present

## 2019-05-06 DIAGNOSIS — I10 Essential (primary) hypertension: Secondary | ICD-10-CM | POA: Insufficient documentation

## 2019-05-06 DIAGNOSIS — Z79899 Other long term (current) drug therapy: Secondary | ICD-10-CM | POA: Diagnosis not present

## 2019-05-06 DIAGNOSIS — G893 Neoplasm related pain (acute) (chronic): Secondary | ICD-10-CM

## 2019-05-06 DIAGNOSIS — Z8673 Personal history of transient ischemic attack (TIA), and cerebral infarction without residual deficits: Secondary | ICD-10-CM | POA: Insufficient documentation

## 2019-05-06 LAB — CBC WITH DIFFERENTIAL/PLATELET
Abs Immature Granulocytes: 0.05 10*3/uL (ref 0.00–0.07)
Basophils Absolute: 0 10*3/uL (ref 0.0–0.1)
Basophils Relative: 0 %
Eosinophils Absolute: 0 10*3/uL (ref 0.0–0.5)
Eosinophils Relative: 1 %
HCT: 31.9 % — ABNORMAL LOW (ref 39.0–52.0)
Hemoglobin: 10 g/dL — ABNORMAL LOW (ref 13.0–17.0)
Immature Granulocytes: 1 %
Lymphocytes Relative: 29 %
Lymphs Abs: 1.4 10*3/uL (ref 0.7–4.0)
MCH: 31.3 pg (ref 26.0–34.0)
MCHC: 31.3 g/dL (ref 30.0–36.0)
MCV: 100 fL (ref 80.0–100.0)
Monocytes Absolute: 0.6 10*3/uL (ref 0.1–1.0)
Monocytes Relative: 12 %
Neutro Abs: 2.8 10*3/uL (ref 1.7–7.7)
Neutrophils Relative %: 57 %
Platelets: 204 10*3/uL (ref 150–400)
RBC: 3.19 MIL/uL — ABNORMAL LOW (ref 4.22–5.81)
RDW: 13.9 % (ref 11.5–15.5)
WBC: 4.9 10*3/uL (ref 4.0–10.5)
nRBC: 0 % (ref 0.0–0.2)

## 2019-05-06 LAB — CMP (CANCER CENTER ONLY)
ALT: <6 U/L (ref 0–44)
AST: 13 U/L — ABNORMAL LOW (ref 15–41)
Albumin: 3.5 g/dL (ref 3.5–5.0)
Alkaline Phosphatase: 83 U/L (ref 38–126)
Anion gap: 8 (ref 5–15)
BUN: 29 mg/dL — ABNORMAL HIGH (ref 8–23)
CO2: 24 mmol/L (ref 22–32)
Calcium: 8.1 mg/dL — ABNORMAL LOW (ref 8.9–10.3)
Chloride: 106 mmol/L (ref 98–111)
Creatinine: 1.88 mg/dL — ABNORMAL HIGH (ref 0.61–1.24)
GFR, Est AFR Am: 37 mL/min — ABNORMAL LOW (ref >60)
GFR, Estimated: 32 mL/min — ABNORMAL LOW (ref >60)
Glucose, Bld: 96 mg/dL (ref 70–99)
Potassium: 4.5 mmol/L (ref 3.5–5.1)
Sodium: 138 mmol/L (ref 135–145)
Total Bilirubin: 0.6 mg/dL (ref 0.3–1.2)
Total Protein: 6.3 g/dL — ABNORMAL LOW (ref 6.5–8.1)

## 2019-05-06 MED ORDER — LEUPROLIDE ACETATE (3 MONTH) 22.5 MG ~~LOC~~ KIT
22.5000 mg | PACK | Freq: Once | SUBCUTANEOUS | Status: AC
  Administered 2019-05-06: 22.5 mg via SUBCUTANEOUS
  Filled 2019-05-06: qty 22.5

## 2019-05-06 MED ORDER — DENOSUMAB 120 MG/1.7ML ~~LOC~~ SOLN
120.0000 mg | Freq: Once | SUBCUTANEOUS | Status: AC
  Administered 2019-05-06: 15:00:00 120 mg via SUBCUTANEOUS

## 2019-05-06 MED ORDER — DENOSUMAB 120 MG/1.7ML ~~LOC~~ SOLN
SUBCUTANEOUS | Status: AC
  Filled 2019-05-06: qty 1.7

## 2019-05-06 NOTE — Progress Notes (Signed)
Per Dr. [name] ok to give [facility] with Ca of 8.1.

## 2019-05-06 NOTE — Patient Instructions (Signed)
Leuprolide depot injection What is this medicine? LEUPROLIDE (loo PROE lide) is a man-made protein that acts like a natural hormone in the body. It decreases testosterone in men and decreases estrogen in women. In men, this medicine is used to treat advanced prostate cancer. In women, some forms of this medicine may be used to treat endometriosis, uterine fibroids, or other male hormone-related problems. This medicine may be used for other purposes; ask your health care provider or pharmacist if you have questions. COMMON BRAND NAME(S): Eligard, Fensolv, Lupron Depot, Lupron Depot-Ped, Viadur What should I tell my health care provider before I take this medicine? They need to know if you have any of these conditions:  diabetes  heart disease or previous heart attack  high blood pressure  high cholesterol  mental illness  osteoporosis  pain or difficulty passing urine  seizures  spinal cord metastasis  stroke  suicidal thoughts, plans, or attempt; a previous suicide attempt by you or a family member  tobacco smoker  unusual vaginal bleeding (women)  an unusual or allergic reaction to leuprolide, benzyl alcohol, other medicines, foods, dyes, or preservatives  pregnant or trying to get pregnant  breast-feeding How should I use this medicine? This medicine is for injection into a muscle or for injection under the skin. It is given by a health care professional in a hospital or clinic setting. The specific product will determine how it will be given to you. Make sure you understand which product you receive and how often you will receive it. Talk to your pediatrician regarding the use of this medicine in children. Special care may be needed. Overdosage: If you think you have taken too much of this medicine contact a poison control center or emergency room at once. NOTE: This medicine is only for you. Do not share this medicine with others. What if I miss a dose? It is  important not to miss a dose. Call your doctor or health care professional if you are unable to keep an appointment. Depot injections: Depot injections are given either once-monthly, every 12 weeks, every 16 weeks, or every 24 weeks depending on the product you are prescribed. The product you are prescribed will be based on if you are male or male, and your condition. Make sure you understand your product and dosing. What may interact with this medicine? Do not take this medicine with any of the following medications:  chasteberry This medicine may also interact with the following medications:  herbal or dietary supplements, like black cohosh or DHEA  male hormones, like estrogens or progestins and birth control pills, patches, rings, or injections  male hormones, like testosterone This list may not describe all possible interactions. Give your health care provider a list of all the medicines, herbs, non-prescription drugs, or dietary supplements you use. Also tell them if you smoke, drink alcohol, or use illegal drugs. Some items may interact with your medicine. What should I watch for while using this medicine? Visit your doctor or health care professional for regular checks on your progress. During the first weeks of treatment, your symptoms may get worse, but then will improve as you continue your treatment. You may get hot flashes, increased bone pain, increased difficulty passing urine, or an aggravation of nerve symptoms. Discuss these effects with your doctor or health care professional, some of them may improve with continued use of this medicine. Male patients may experience a menstrual cycle or spotting during the first months of therapy with this medicine.   If this continues, contact your doctor or health care professional. This medicine may increase blood sugar. Ask your healthcare provider if changes in diet or medicines are needed if you have diabetes. What side effects may I  notice from receiving this medicine? Side effects that you should report to your doctor or health care professional as soon as possible:  allergic reactions like skin rash, itching or hives, swelling of the face, lips, or tongue  breathing problems  chest pain  depression or memory disorders  pain in your legs or groin  pain at site where injected or implanted  seizures  severe headache  signs and symptoms of high blood sugar such as being more thirsty or hungry or having to urinate more than normal. You may also feel very tired or have blurry vision  swelling of the feet and legs  suicidal thoughts or other mood changes  visual changes  vomiting Side effects that usually do not require medical attention (report to your doctor or health care professional if they continue or are bothersome):  breast swelling or tenderness  decrease in sex drive or performance  diarrhea  hot flashes  loss of appetite  muscle, joint, or bone pains  nausea  redness or irritation at site where injected or implanted  skin problems or acne This list may not describe all possible side effects. Call your doctor for medical advice about side effects. You may report side effects to FDA at 1-800-FDA-1088. Where should I keep my medicine? This drug is given in a hospital or clinic and will not be stored at home. NOTE: This sheet is a summary. It may not cover all possible information. If you have questions about this medicine, talk to your doctor, pharmacist, or health care provider.  2020 [employer]/Gold Standard (2018-02-14 09:27:03) Denosumab injection What is this medicine? DENOSUMAB (den oh sue mab) slows bone breakdown. Prolia is used to treat osteoporosis in women after menopause and in men, and in people who are taking corticosteroids for 6 months or more. Xgeva is used to treat a high calcium level due to cancer and to prevent bone fractures and other bone problems caused by multiple  myeloma or cancer bone metastases. Xgeva is also used to treat giant cell tumor of the bone. This medicine may be used for other purposes; ask your health care provider or pharmacist if you have questions. COMMON BRAND NAME(S): Prolia, XGEVA What should I tell my health care provider before I take this medicine? They need to know if you have any of these conditions:  dental disease  having surgery or tooth extraction  infection  kidney disease  low levels of calcium or Vitamin D in the blood  malnutrition  on hemodialysis  skin conditions or sensitivity  thyroid or parathyroid disease  an unusual reaction to denosumab, other medicines, foods, dyes, or preservatives  pregnant or trying to get pregnant  breast-feeding How should I use this medicine? This medicine is for injection under the skin. It is given by a health care professional in a hospital or clinic setting. A special MedGuide will be given to you before each treatment. Be sure to read this information carefully each time. For Prolia, talk to your pediatrician regarding the use of this medicine in children. Special care may be needed. For Xgeva, talk to your pediatrician regarding the use of this medicine in children. While this drug may be prescribed for children as young as 13 years for selected conditions, precautions do apply. Overdosage:   If you think you have taken too much of this medicine contact a poison control center or emergency room at once. NOTE: This medicine is only for you. Do not share this medicine with others. What if I miss a dose? It is important not to miss your dose. Call your doctor or health care professional if you are unable to keep an appointment. What may interact with this medicine? Do not take this medicine with any of the following medications:  other medicines containing denosumab This medicine may also interact with the following medications:  medicines that lower your chance of  fighting infection  steroid medicines like prednisone or cortisone This list may not describe all possible interactions. Give your health care provider a list of all the medicines, herbs, non-prescription drugs, or dietary supplements you use. Also tell them if you smoke, drink alcohol, or use illegal drugs. Some items may interact with your medicine. What should I watch for while using this medicine? Visit your doctor or health care professional for regular checks on your progress. Your doctor or health care professional may order blood tests and other tests to see how you are doing. Call your doctor or health care professional for advice if you get a fever, chills or sore throat, or other symptoms of a cold or flu. Do not treat yourself. This drug may decrease your body's ability to fight infection. Try to avoid being around people who are sick. You should make sure you get enough calcium and vitamin D while you are taking this medicine, unless your doctor tells you not to. Discuss the foods you eat and the vitamins you take with your health care professional. See your dentist regularly. Brush and floss your teeth as directed. Before you have any dental work done, tell your dentist you are receiving this medicine. Do not become pregnant while taking this medicine or for 5 months after stopping it. Talk with your doctor or health care professional about your birth control options while taking this medicine. Women should inform their doctor if they wish to become pregnant or think they might be pregnant. There is a potential for serious side effects to an unborn child. Talk to your health care professional or pharmacist for more information. What side effects may I notice from receiving this medicine? Side effects that you should report to your doctor or health care professional as soon as possible:  allergic reactions like skin rash, itching or hives, swelling of the face, lips, or tongue  bone  pain  breathing problems  dizziness  jaw pain, especially after dental work  redness, blistering, peeling of the skin  signs and symptoms of infection like fever or chills; cough; sore throat; pain or trouble passing urine  signs of low calcium like fast heartbeat, muscle cramps or muscle pain; pain, tingling, numbness in the hands or feet; seizures  unusual bleeding or bruising  unusually weak or tired Side effects that usually do not require medical attention (report to your doctor or health care professional if they continue or are bothersome):  constipation  diarrhea  headache  joint pain  loss of appetite  muscle pain  runny nose  tiredness  upset stomach This list may not describe all possible side effects. Call your doctor for medical advice about side effects. You may report side effects to FDA at 1-800-FDA-1088. Where should I keep my medicine? This medicine is only given in a clinic, doctor's office, or other health care setting and will not be   stored at home. NOTE: This sheet is a summary. It may not cover all possible information. If you have questions about this medicine, talk to your doctor, pharmacist, or health care provider.  2020 [employer]/Gold Standard (2017-08-24 16:10:44)  

## 2019-05-09 ENCOUNTER — Other Ambulatory Visit: Payer: Self-pay | Admitting: *Deleted

## 2019-05-09 ENCOUNTER — Telehealth: Payer: Self-pay | Admitting: Hematology

## 2019-05-09 DIAGNOSIS — C7951 Secondary malignant neoplasm of bone: Secondary | ICD-10-CM

## 2019-05-09 DIAGNOSIS — C61 Malignant neoplasm of prostate: Secondary | ICD-10-CM

## 2019-05-09 MED ORDER — FENTANYL 25 MCG/HR TD PT72: 1.0000 | MEDICATED_PATCH | TRANSDERMAL | 0 refills | Status: DC

## 2019-05-09 MED ORDER — VITAMIN D (ERGOCALCIFEROL) 1.25 MG (50000 UNIT) PO CAPS: ORAL_CAPSULE | ORAL | 3 refills | Status: DC

## 2019-05-09 NOTE — Telephone Encounter (Signed)
Scheduled appt per 1/8 sch message - pt daughter aware of appt date and time

## 2019-05-09 NOTE — Telephone Encounter (Signed)
Refill Fentanyl patch

## 2019-05-16 ENCOUNTER — Other Ambulatory Visit: Payer: Self-pay | Admitting: Family Medicine

## 2019-05-16 NOTE — Telephone Encounter (Signed)
Last OV 07/26/17 Last refill 01/20/19 #180/0 Next OV 05/22/19

## 2019-05-20 ENCOUNTER — Ambulatory Visit: Payer: Medicare Other

## 2019-05-20 ENCOUNTER — Other Ambulatory Visit: Payer: Medicare Other

## 2019-05-21 ENCOUNTER — Other Ambulatory Visit: Payer: Self-pay | Admitting: Hematology

## 2019-05-22 ENCOUNTER — Ambulatory Visit: Payer: Medicare Other | Admitting: Family Medicine

## 2019-05-22 MED FILL — ABIRATERONE ACETATE 250 MG: 250 | 30 days supply | Qty: 60 | Fill #1

## 2019-05-22 MED FILL — predniSONE 5 MG TABS: 5 | 30 days supply | Qty: 60 | Fill #1

## 2019-05-23 ENCOUNTER — Inpatient Hospital Stay (HOSPITAL_BASED_OUTPATIENT_CLINIC_OR_DEPARTMENT_OTHER): Payer: Medicare Other | Admitting: Hematology

## 2019-05-23 ENCOUNTER — Inpatient Hospital Stay: Payer: Medicare Other

## 2019-05-23 ENCOUNTER — Other Ambulatory Visit: Payer: Self-pay

## 2019-05-23 VITALS — BP 133/72 | HR 77 | Temp 97.8°F | Resp 18 | Ht 67.0 in | Wt 175.7 lb

## 2019-05-23 DIAGNOSIS — C7951 Secondary malignant neoplasm of bone: Secondary | ICD-10-CM | POA: Diagnosis not present

## 2019-05-23 DIAGNOSIS — Z8673 Personal history of transient ischemic attack (TIA), and cerebral infarction without residual deficits: Secondary | ICD-10-CM | POA: Diagnosis not present

## 2019-05-23 DIAGNOSIS — I10 Essential (primary) hypertension: Secondary | ICD-10-CM | POA: Diagnosis not present

## 2019-05-23 DIAGNOSIS — Z79899 Other long term (current) drug therapy: Secondary | ICD-10-CM | POA: Diagnosis not present

## 2019-05-23 DIAGNOSIS — G893 Neoplasm related pain (acute) (chronic): Secondary | ICD-10-CM

## 2019-05-23 DIAGNOSIS — I252 Old myocardial infarction: Secondary | ICD-10-CM | POA: Diagnosis not present

## 2019-05-23 DIAGNOSIS — C61 Malignant neoplasm of prostate: Secondary | ICD-10-CM

## 2019-05-23 DIAGNOSIS — Z87891 Personal history of nicotine dependence: Secondary | ICD-10-CM | POA: Diagnosis not present

## 2019-05-23 DIAGNOSIS — E785 Hyperlipidemia, unspecified: Secondary | ICD-10-CM | POA: Diagnosis not present

## 2019-05-23 DIAGNOSIS — K5900 Constipation, unspecified: Secondary | ICD-10-CM | POA: Diagnosis not present

## 2019-05-23 DIAGNOSIS — D6182 Myelophthisis: Secondary | ICD-10-CM

## 2019-05-23 DIAGNOSIS — D649 Anemia, unspecified: Secondary | ICD-10-CM | POA: Diagnosis not present

## 2019-05-23 LAB — CMP (CANCER CENTER ONLY)
ALT: <6 U/L (ref 0–44)
AST: 12 U/L — ABNORMAL LOW (ref 15–41)
Albumin: 3.4 g/dL — ABNORMAL LOW (ref 3.5–5.0)
Alkaline Phosphatase: 106 U/L (ref 38–126)
Anion gap: 9 (ref 5–15)
BUN: 30 mg/dL — ABNORMAL HIGH (ref 8–23)
CO2: 24 mmol/L (ref 22–32)
Calcium: 7.7 mg/dL — ABNORMAL LOW (ref 8.9–10.3)
Chloride: 106 mmol/L (ref 98–111)
Creatinine: 1.68 mg/dL — ABNORMAL HIGH (ref 0.61–1.24)
GFR, Est AFR Am: 42 mL/min — ABNORMAL LOW (ref >60)
GFR, Estimated: 36 mL/min — ABNORMAL LOW (ref >60)
Glucose, Bld: 93 mg/dL (ref 70–99)
Potassium: 4.1 mmol/L (ref 3.5–5.1)
Sodium: 139 mmol/L (ref 135–145)
Total Bilirubin: 0.6 mg/dL (ref 0.3–1.2)
Total Protein: 6.1 g/dL — ABNORMAL LOW (ref 6.5–8.1)

## 2019-05-23 LAB — CBC WITH DIFFERENTIAL/PLATELET
Abs Immature Granulocytes: 0.06 10*3/uL (ref 0.00–0.07)
Basophils Absolute: 0 10*3/uL (ref 0.0–0.1)
Basophils Relative: 0 %
Eosinophils Absolute: 0 10*3/uL (ref 0.0–0.5)
Eosinophils Relative: 1 %
HCT: 32.5 % — ABNORMAL LOW (ref 39.0–52.0)
Hemoglobin: 10.1 g/dL — ABNORMAL LOW (ref 13.0–17.0)
Immature Granulocytes: 1 %
Lymphocytes Relative: 23 %
Lymphs Abs: 1.4 10*3/uL (ref 0.7–4.0)
MCH: 32.2 pg (ref 26.0–34.0)
MCHC: 31.1 g/dL (ref 30.0–36.0)
MCV: 103.5 fL — ABNORMAL HIGH (ref 80.0–100.0)
Monocytes Absolute: 0.6 10*3/uL (ref 0.1–1.0)
Monocytes Relative: 10 %
Neutro Abs: 4 10*3/uL (ref 1.7–7.7)
Neutrophils Relative %: 65 %
Platelets: 163 10*3/uL (ref 150–400)
RBC: 3.14 MIL/uL — ABNORMAL LOW (ref 4.22–5.81)
RDW: 14.4 % (ref 11.5–15.5)
WBC: 6.1 10*3/uL (ref 4.0–10.5)
nRBC: 0 % (ref 0.0–0.2)

## 2019-05-23 MED ORDER — ABIRATERONE ACETATE 250 MG PO TABS: 1000.0000 mg | ORAL_TABLET | Freq: Every day | ORAL | 2 refills | Status: DC

## 2019-05-23 MED ORDER — PREDNISONE 5 MG PO TABS: 5.0000 mg | ORAL_TABLET | Freq: Two times a day (BID) | ORAL | 2 refills | Status: DC

## 2019-05-23 NOTE — Progress Notes (Signed)
HEMATOLOGY/ONCOLOGY CLINIC NOTE  Date of Service: 05/23/19    Patient Care Team: [name], MD as PCP - General (Family Medicine)  CHIEF COMPLAINTS/PURPOSE OF CONSULTATION:  -recently diagnosed metastatic prostate cancer -Myelopthisic anemia  HISTORY OF PRESENTING ILLNESS:   Tony [name] is a wonderful 86 y.o. male who has been referred to [place] by Dr Tony [name] for evaluation and management of anemia. He is accompanied today by his daughter. The pt reports that he is doing well overall.   The pt reports a new onset of fatigue that began in January 2019. His daughter notes being able to tell a difference in his energy levels as early as November 2018. He notes that some cramping pain in his thighs. He notes that his recent 07/23/17 blood transfusion has led to a "terrific, immediate change" in his thigh pain. However, his thigh pain has returned in the last couple days.  He notes that he takes 1019mg Vitamin B12 daily.   He notes that for 6-7 years he took iron pills after his 2012 heart attack and stroke. He notes that he took a statin for 6-7 months and was taken off of it recently. He notes no unresolved symptoms from his stroke.   The pt notes that over the last 4-5 months his appetite has diminished and he has subsequently lost about 20 lbs in that time.  He notes that he has historically not preferred to drink water as such, but hydrates with juice, coffee, and other drinks.  He notes that he believes that he is able to take care of himself adequately at home. He also notes that his cane is sufficient for helping him get around.   Most recent lab results (07/22/17) of CBC  is as follows: all values are WNL except for RBC at 2.94, Hgb at 8.7, HCT at 27.2, RDW at 26.5, Platelets at 134k. CBC from 10/30/16 revealed all values WNL.  CMP 07/21/17 revealed all values WNL except for Sodium at 134, Glucose at 101, Creatinine at 1.85, Calcium at 7.9, Total Protein at 5.7, Albumin at  2.7, Alk Phos at 433.  LDH 07/20/17 elevated at 256. Haptoglobin 07/20/17 elevated at 357.  Vitamin B12 07/06/17 is WNL at 229.   On review of systems, pt reports decreased appetite, losing 20 lbs over 4-5 months, bilateral thigh pain, fatigue, mild leg swelling, and denies fevers, chills, night sweats, bone pains, nausea, abdominal pains, bleeding, blood in the urine, blood in the stools, black stools, changes in bowel habits, light headednss, dizziness, abdominal pains, noticing any new lumps or bumps, testicular pain or swelling, and any other symptoms.   On PMHx the pt reports heart attack and stroke in 2012. On Social Hx the pt denies much ETOH consumption.   Interval History:  Mr. Tony Brandelreturns today regarding his metastatic prostate cancer and myelopthisic anemia. The patient's last visit with [place] on 04[place]17/2019. The pt reports that he is doing well overall.  The pt reports he has his appetite back.  He is tolerating the Zytiga. He has noticed a few chills and hot flashes.  He still feels fatigued that is slightly increasing.  Lab results today (05/23/19) of CBC w/diff and CMP is as follows: all values are WNL except for RBC at 3.14, Hemoglobin at 10.1, HCT at 32.5, MCV at 103.5, BUN at 30, Creatinine at 1.68, Calcium at 7.7, Total Protein at 6.1,  Albumin at 3.4, AST at 12, GFR Est Non AF AM  at 34, GFR Est AFR AM at 42.  On review of systems, pt reports some constipation and denies urinary concerns, abdominal pain,leg swelling and any other symptoms.   MEDICAL HISTORY:  Past Medical History:  Diagnosis Date  . BPH associated with nocturia    flomax 0.4--> 0.8 mg trial. nocturia if has coffee. some incontinence  . CAD (coronary artery disease)    LAD Stent 2012. 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   . Gout    no rx. apparently 1x in past  . History of stroke    no aspirin before stroke. no deficits. slurred words at time of stroke  .  Hyperlipidemia   . Hypertension     SURGICAL HISTORY: Past Surgical History:  Procedure Laterality Date  . CARDIAC SURGERY     Stint  . CORONARY STENT PLACEMENT    . left arm fracture s/p surgery    . right leg fracture s.p surgery- screws like arm      SOCIAL HISTORY: Social History   Socioeconomic History  . Marital status: Married    Spouse name: Not on file  . Number of children: Not on file  . Years of education: Not on file  . Highest education level: Not on file  Occupational History  . Not on file  Tobacco Use  . Smoking status: Former Smoker    Packs/day: 0.50    Years: 1.00    Pack years: 0.50    Types: Cigarettes    Quit date: 05/01/1952    Years since quitting: 67.1  . Smokeless tobacco: Never Used  Substance and Sexual Activity  . Alcohol use: No  . Drug use: No  . Sexual activity: Not on file  Other Topics Concern  . Not on file  Social History Narrative   Married- lives separate from wife. 2 children. Daughter passed from heart attack.       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 Difficulty of Paying Living Expenses: Not on file  Food Insecurity:   . Worried About Charity fundraiser in the Last Year: Not on file  . Ran Out of Food in the Last Year: Not on file  Transportation Needs:   . Lack of Transportation (Medical): Not on file  . Lack of Transportation (Non-Medical): Not on file  Physical Activity:   . Days of Exercise per Week: Not on file  . Minutes of Exercise per Session: Not on file  Stress:   . Feeling of Stress : Not on file  Social Connections:   . Frequency of Communication with Friends and Family: Not on file  . Frequency of Social Gatherings with Friends and Family: Not on file  . Attends Religious Services: Not on file  . Active Member of Clubs or Organizations: Not on file  . Attends Archivist Meetings: Not on  file  . Marital Status: Not on file  Intimate Partner Violence:   . Fear of Current or Ex-Partner: Not on file  . Emotionally Abused: Not on file  . Physically Abused: Not on file  . Sexually Abused: Not on file    FAMILY HISTORY: Family History  Problem Relation Age of Onset  . Breast cancer Mother   . Heart disease Father   . Heart disease Brother        x2  .  Prostate cancer Brother     ALLERGIES:  has No Known Allergies.  MEDICATIONS:  Current Outpatient Medications  Medication Sig Dispense Refill  . abiraterone acetate (ZYTIGA) 250 MG tablet Take 2 tablets (500 mg total) by mouth daily. Take on an empty stomach 1 hour before or 2 hours after a meal 120 tablet 0  . aspirin EC 81 MG tablet Take 1 tablet (81 mg total) by mouth daily. 90 tablet 3  . BIDIL 20-37.5 MG tablet TAKE 1 TABLET BY MOUTH TWICE DAILY 180 tablet 3  . calcium-vitamin D (OSCAL 500/200 D-3) 500-200 MG-UNIT tablet Take 1 tablet by mouth daily with breakfast. 30 tablet 2  . fentaNYL (DURAGESIC) 25 MCG/HR Place 1 patch onto the skin every 3 (three) days. 10 patch 0  . metoprolol succinate (TOPROL-XL) 25 MG 24 hr tablet TAKE 1 TABLET BY MOUTH EVERY DAY 90 tablet 0  . Multiple Vitamins-Minerals (MULTI COMPLETE PO) Take by mouth.    . nitroGLYCERIN (NITROSTAT) 0.4 MG SL tablet Place 1 tablet (0.4 mg total) under the tongue every 5 (five) minutes as needed for chest pain (3 maximum before seeking care). 30 tablet 0  . Omega-3 1000 MG CAPS Take 1 capsule by mouth daily.     Marland Kitchen oxyCODONE (OXY IR/ROXICODONE) 5 MG immediate release tablet Take 1 tablet (5 mg total) by mouth every 4 (four) hours as needed for severe pain. 90 tablet 0  . polyethylene glycol (MIRALAX) packet Take 17 g by mouth daily. 30 each 1  . predniSONE (DELTASONE) 5 MG tablet TAKE 1 TABLET BY MOUTH TWO TIMES DAILY WITH A MEAL 60 tablet 3  . senna-docusate (SENNA S) 8.6-50 MG tablet Take 2 tablets by mouth 2 (two) times daily. 120 tablet 2  . tamsulosin  (FLOMAX) 0.4 MG CAPS capsule TAKE ONE CAPSULE BY MOUTH TWICE DAILY 180 capsule 3  . vitamin B-12 (CYANOCOBALAMIN) 1000 MCG tablet Take 1 tablet (1,000 mcg total) by mouth daily. 30 tablet 0  . Vitamin D, Ergocalciferol, (DRISDOL) 1.25 MG (50000 UT) CAPS capsule Take 1 capsule twice a week 24 capsule 3   No current facility-administered medications for this visit.    REVIEW OF SYSTEMS:   A 10+ POINT REVIEW OF SYSTEMS WAS OBTAINED including neurology, dermatology, psychiatry, cardiac, respiratory, lymph, extremities, GI, GU, Musculoskeletal, constitutional, breasts, reproductive, HEENT.  All pertinent positives are noted in the HPI.  All others are negative.   PHYSICAL EXAMINATION: ECOG FS:2 - Symptomatic, <50% confined to bed  Vitals:   05/23/19 1154  BP: 133/72  Pulse: 77  Resp: 18  Temp: 97.8 F (36.6 C)  SpO2: 100%   Wt Readings from Last 3 Encounters:  05/23/19 175 lb 11.2 oz (79.7 kg)  01/28/19 172 lb 12.8 oz (78.4 kg)  12/03/18 173 lb 8 oz (78.7 kg)   Exam performed in a wheelchair   Body mass index is 27.52 kg/m.    GENERAL:alert, in no acute distress and comfortable SKIN: no acute rashes, no significant lesions EYES: conjunctiva are pink and non-injected, sclera anicteric OROPHARYNX: MMM, no exudates, no oropharyngeal erythema or ulceration NECK: supple, no JVD LYMPH:  no palpable lymphadenopathy in the cervical, axillary or inguinal regions LUNGS: clear to auscultation b/l with normal respiratory effort HEART: regular rate & rhythm ABDOMEN:  normoactive bowel sounds , non tender, not distended. Extremity: no pedal edema PSYCH: alert & oriented x 3 with fluent speech NEURO: no focal motor/sensory deficits    LABORATORY DATA:  I have reviewed the data  as listed  . CBC Latest Ref Rng & Units 05/23/2019 05/06/2019 04/01/2019  WBC 4.0 - 10.5 K/uL 6.1 4.9 4.3  Hemoglobin 13.0 - 17.0 g/dL 10.1(L) 10.0(L) 9.7(L)  Hematocrit 39.0 - 52.0 % 32.5(L) 31.9(L) 31.9(L)    Platelets 150 - 400 K/uL 163 204 212   . CBC    Component Value Date/Time   WBC 6.1 05/23/2019 1105   RBC 3.14 (L) 05/23/2019 1105   HGB 10.1 (L) 05/23/2019 1105   HGB 9.7 (L) 05/31/2018 1050   HCT 32.5 (L) 05/23/2019 1105   HCT 21.9 (L) 07/20/2017 2103   PLT 163 05/23/2019 1105   PLT 231 05/31/2018 1050   MCV 103.5 (H) 05/23/2019 1105   MCH 32.2 05/23/2019 1105   MCHC 31.1 05/23/2019 1105   RDW 14.4 05/23/2019 1105   LYMPHSABS 1.4 05/23/2019 1105   MONOABS 0.6 05/23/2019 1105   EOSABS 0.0 05/23/2019 1105   BASOSABS 0.0 05/23/2019 1105     . CMP Latest Ref Rng & Units 05/06/2019 04/01/2019 02/25/2019  Glucose 70 - 99 mg/dL 96 101(H) 105(H)  BUN 8 - 23 mg/dL 29(H) 26(H) 28(H)  Creatinine 0.61 - 1.24 mg/dL 1.88(H) 2.00(H) 2.12(H)  Sodium 135 - 145 mmol/L 138 136 138  Potassium 3.5 - 5.1 mmol/L 4.5 4.7 4.8  Chloride 98 - 111 mmol/L 106 103 106  CO2 22 - 32 mmol/L _0 Calcium 8.9 - 10.3 mg/dL 8.1(L) 8.9 8.7(L)  Total Protein 6.5 - 8.1 g/dL 6.3(L) 7.0 7.0  Total Bilirubin 0.3 - 1.2 mg/dL 0.6 0.7 0.7  Alkaline Phos 38 - 126 U/L 83 114 104  AST 15 - 41 U/L 13(L) 17 14(L)  ALT 0 - 44 U/L <_1 Component     Latest Ref Rng & Units 07/30/2017 09/06/2017 10/04/2017 10/31/2017  Prostate Specific Ag, Serum     0.0 - 4.0 ng/mL 1,186.0 (H) 433.8 (H) 67.4 (H) 55.8 (H)   07/31/17 BM Bx:   RADIOGRAPHIC STUDIES: I have personally reviewed the radiological images as listed and agreed with the findings in the report. No results found.  ASSESSMENT & PLAN:   86 y.o. male with  1. Normocytic Anemia - due to primarily metastatic prostatic cancer (ACD + BM involvement with prostate cancer causing myelopthisic picture)  -Pt presented with a Hgb at 8.7 on 07/22/17, improved to 10.3 after receiving PRBC transfusion. -In review of the patient's previous CBC records, his anemia developed 6-7 months before presenting to care with me, unaccompanied by a drop in his EPO (07/20/17 elevated at  249.8);  LDH elevated  but haptoglobin at 357 on 07/20/17. -suggested against active hemolysis.  2. Recently diagnosed metastatic prostate cancer with extensive bone metastases. With bone mets/BM Mets and LNadenopathy. Exam positive for left retroperitoneal, bilateral pelvis and right inguinal adenopathy. Exam positive for left retroperitoneal, bilateral pelvis and right inguinal adenopathy. Diffuse sclerotic bone metastasis.   3. Elevated alkaline phosphatase due to bone metastases from prostate cancer -CT BM bx -concerning for significant bone lesions in lower spine and pelvis PSA levels have declined to 26.9  4. Cancer related pain - primarily in b/l thighs. -much improved.  PLAN:  -Discussed pt labwork today, 05/23/19; all values are WNL except for RBC at 3.14, Hemoglobin at 10.1, HCT at 32.5, MCV at 103.5, BUN at 30, Creatinine at 1.68, Calcium at 7.7, Total Protein at 6.1,  Albumin at 3.4, AST at 12, GFR Est Non AF AM at 36, GFR Est AFR  AM at 42. -Discussed registering for COVID-19 vaccine. Recommended he register to take the vaccine. -Discussed that it takes Zytiga about 2 months to evaluate for improvement in PSA levels. - Will increase Zytiga to 3 pills a day. If tolerated will increase to 4 pills a day. -Discussed 05/23/19 hemoglobin at 10.1 -Discussed 05/23/19 calcium at 7.7. Will increase Vitamin D tablets from 2 pills a week to 3 pills a week.  -Discussed 05/23/19  Creatinine at 1.68 -Will write 90 day prescription for prednisone   -Discussed that prednisone is replace what the adrenal glands make and to prevent the side affects of Zytiga   -Will see back in 6 weeks  FOLLOW UP: F/u for Xgeva q4weeks --plz schedule next 6 appointments Continue Lupron q12 weeks-- plz schedule next 4 appointments Labs in 2 weeks with next Xgeva dose RTC with Dr [name] with labs in 6 weeks with subsequent to next Xgeva dose    The total time spent in the appt was 30 minutes and more than 50%  was on counseling and direct patient cares.  All of the patient's questions were answered with apparent satisfaction. The patient knows to call the clinic with any problems, questions or concerns.    Sullivan Lone MD MS AAHIVMS [facility] Hematology/Oncology Physician [facility]  (Office):       [phone] (Work cell):  [phone] (Fax):           [phone]  05/23/2019 12:56 AM  I, Scot Dock, am acting as a scribe for Dr. Sullivan Lone.   .I have reviewed the above documentation for accuracy and completeness, and I agree with the above. Brunetta Genera MD

## 2019-05-27 ENCOUNTER — Ambulatory Visit: Payer: Medicare Other

## 2019-05-27 ENCOUNTER — Other Ambulatory Visit: Payer: Medicare Other

## 2019-05-27 ENCOUNTER — Telehealth: Payer: Self-pay | Admitting: Hematology

## 2019-05-27 NOTE — Telephone Encounter (Signed)
Scheduled per 01/22 los, patient has been called and notified.  

## 2019-06-03 ENCOUNTER — Inpatient Hospital Stay: Payer: Medicare Other

## 2019-06-03 ENCOUNTER — Inpatient Hospital Stay: Payer: Medicare Other | Attending: Hematology

## 2019-06-03 ENCOUNTER — Other Ambulatory Visit: Payer: Self-pay

## 2019-06-03 VITALS — BP 125/71 | HR 74 | Temp 98.7°F | Resp 16

## 2019-06-03 DIAGNOSIS — G893 Neoplasm related pain (acute) (chronic): Secondary | ICD-10-CM

## 2019-06-03 DIAGNOSIS — C61 Malignant neoplasm of prostate: Secondary | ICD-10-CM | POA: Diagnosis present

## 2019-06-03 DIAGNOSIS — C7951 Secondary malignant neoplasm of bone: Secondary | ICD-10-CM | POA: Diagnosis not present

## 2019-06-03 LAB — CBC WITH DIFFERENTIAL/PLATELET
Abs Immature Granulocytes: 0.13 10*3/uL — ABNORMAL HIGH (ref 0.00–0.07)
Basophils Absolute: 0 10*3/uL (ref 0.0–0.1)
Basophils Relative: 0 %
Eosinophils Absolute: 0 10*3/uL (ref 0.0–0.5)
Eosinophils Relative: 1 %
HCT: 33.5 % — ABNORMAL LOW (ref 39.0–52.0)
Hemoglobin: 10.1 g/dL — ABNORMAL LOW (ref 13.0–17.0)
Immature Granulocytes: 2 %
Lymphocytes Relative: 30 %
Lymphs Abs: 1.7 10*3/uL (ref 0.7–4.0)
MCH: 32.2 pg (ref 26.0–34.0)
MCHC: 30.1 g/dL (ref 30.0–36.0)
MCV: 106.7 fL — ABNORMAL HIGH (ref 80.0–100.0)
Monocytes Absolute: 0.6 10*3/uL (ref 0.1–1.0)
Monocytes Relative: 10 %
Neutro Abs: 3.2 10*3/uL (ref 1.7–7.7)
Neutrophils Relative %: 57 %
Platelets: 157 10*3/uL (ref 150–400)
RBC: 3.14 MIL/uL — ABNORMAL LOW (ref 4.22–5.81)
RDW: 14.7 % (ref 11.5–15.5)
WBC: 5.6 10*3/uL (ref 4.0–10.5)
nRBC: 0.4 % — ABNORMAL HIGH (ref 0.0–0.2)

## 2019-06-03 LAB — CMP (CANCER CENTER ONLY)
ALT: 11 U/L (ref 0–44)
AST: 13 U/L — ABNORMAL LOW (ref 15–41)
Albumin: 3.5 g/dL (ref 3.5–5.0)
Alkaline Phosphatase: 113 U/L (ref 38–126)
Anion gap: 7 (ref 5–15)
BUN: 29 mg/dL — ABNORMAL HIGH (ref 8–23)
CO2: 24 mmol/L (ref 22–32)
Calcium: 7.7 mg/dL — ABNORMAL LOW (ref 8.9–10.3)
Chloride: 109 mmol/L (ref 98–111)
Creatinine: 1.68 mg/dL — ABNORMAL HIGH (ref 0.61–1.24)
GFR, Est AFR Am: 42 mL/min — ABNORMAL LOW (ref >60)
GFR, Estimated: 36 mL/min — ABNORMAL LOW (ref >60)
Glucose, Bld: 92 mg/dL (ref 70–99)
Potassium: 4.6 mmol/L (ref 3.5–5.1)
Sodium: 140 mmol/L (ref 135–145)
Total Bilirubin: 0.8 mg/dL (ref 0.3–1.2)
Total Protein: 6.4 g/dL — ABNORMAL LOW (ref 6.5–8.1)

## 2019-06-03 MED ORDER — DENOSUMAB 120 MG/1.7ML ~~LOC~~ SOLN
SUBCUTANEOUS | Status: AC
  Filled 2019-06-03: qty 1.7

## 2019-06-03 MED ORDER — DENOSUMAB 120 MG/1.7ML ~~LOC~~ SOLN
120.0000 mg | Freq: Once | SUBCUTANEOUS | Status: AC
  Administered 2019-06-03: 12:00:00 120 mg via SUBCUTANEOUS

## 2019-06-03 NOTE — Patient Instructions (Signed)
Denosumab injection What is this medicine? DENOSUMAB (den oh sue mab) slows bone breakdown. Prolia is used to treat osteoporosis in women after menopause and in men, and in people who are taking corticosteroids for 6 months or more. Xgeva is used to treat a high calcium level due to cancer and to prevent bone fractures and other bone problems caused by multiple myeloma or cancer bone metastases. Xgeva is also used to treat giant cell tumor of the bone. This medicine may be used for other purposes; ask your health care provider or pharmacist if you have questions. COMMON BRAND NAME(S): Prolia, XGEVA What should I tell my health care provider before I take this medicine? They need to know if you have any of these conditions:  dental disease  having surgery or tooth extraction  infection  kidney disease  low levels of calcium or Vitamin D in the blood  malnutrition  on hemodialysis  skin conditions or sensitivity  thyroid or parathyroid disease  an unusual reaction to denosumab, other medicines, foods, dyes, or preservatives  pregnant or trying to get pregnant  breast-feeding How should I use this medicine? This medicine is for injection under the skin. It is given by a health care professional in a hospital or clinic setting. A special MedGuide will be given to you before each treatment. Be sure to read this information carefully each time. For Prolia, talk to your pediatrician regarding the use of this medicine in children. Special care may be needed. For Xgeva, talk to your pediatrician regarding the use of this medicine in children. While this drug may be prescribed for children as young as 13 years for selected conditions, precautions do apply. Overdosage: If you think you have taken too much of this medicine contact a poison control center or emergency room at once. NOTE: This medicine is only for you. Do not share this medicine with others. What if I miss a dose? It is  important not to miss your dose. Call your doctor or health care professional if you are unable to keep an appointment. What may interact with this medicine? Do not take this medicine with any of the following medications:  other medicines containing denosumab This medicine may also interact with the following medications:  medicines that lower your chance of fighting infection  steroid medicines like prednisone or cortisone This list may not describe all possible interactions. Give your health care provider a list of all the medicines, herbs, non-prescription drugs, or dietary supplements you use. Also tell them if you smoke, drink alcohol, or use illegal drugs. Some items may interact with your medicine. What should I watch for while using this medicine? Visit your doctor or health care professional for regular checks on your progress. Your doctor or health care professional may order blood tests and other tests to see how you are doing. Call your doctor or health care professional for advice if you get a fever, chills or sore throat, or other symptoms of a cold or flu. Do not treat yourself. This drug may decrease your body's ability to fight infection. Try to avoid being around people who are sick. You should make sure you get enough calcium and vitamin D while you are taking this medicine, unless your doctor tells you not to. Discuss the foods you eat and the vitamins you take with your health care professional. See your dentist regularly. Brush and floss your teeth as directed. Before you have any dental work done, tell your dentist you are   receiving this medicine. Do not become pregnant while taking this medicine or for 5 months after stopping it. Talk with your doctor or health care professional about your birth control options while taking this medicine. Women should inform their doctor if they wish to become pregnant or think they might be pregnant. There is a potential for serious side  effects to an unborn child. Talk to your health care professional or pharmacist for more information. What side effects may I notice from receiving this medicine? Side effects that you should report to your doctor or health care professional as soon as possible:  allergic reactions like skin rash, itching or hives, swelling of the face, lips, or tongue  bone pain  breathing problems  dizziness  jaw pain, especially after dental work  redness, blistering, peeling of the skin  signs and symptoms of infection like fever or chills; cough; sore throat; pain or trouble passing urine  signs of low calcium like fast heartbeat, muscle cramps or muscle pain; pain, tingling, numbness in the hands or feet; seizures  unusual bleeding or bruising  unusually weak or tired Side effects that usually do not require medical attention (report to your doctor or health care professional if they continue or are bothersome):  constipation  diarrhea  headache  joint pain  loss of appetite  muscle pain  runny nose  tiredness  upset stomach This list may not describe all possible side effects. Call your doctor for medical advice about side effects. You may report side effects to FDA at 1-800-FDA-1088. Where should I keep my medicine? This medicine is only given in a clinic, doctor's office, or other health care setting and will not be stored at home. NOTE: This sheet is a summary. It may not cover all possible information. If you have questions about this medicine, talk to your doctor, pharmacist, or health care provider.  2020 Elsevier/Gold Standard (2017-08-24 16:10:44)

## 2019-06-03 NOTE — Progress Notes (Signed)
Per MD patient can receive Xgeva today with calcium of 7.7. Notified pharmacy.

## 2019-06-04 LAB — PROSTATE-SPECIFIC AG, SERUM (LABCORP): Prostate Specific Ag, Serum: 117 ng/mL — ABNORMAL HIGH (ref 0.0–4.0)

## 2019-06-05 ENCOUNTER — Telehealth: Payer: Self-pay | Admitting: Family Medicine

## 2019-06-05 NOTE — Telephone Encounter (Signed)
I left a message asking the patient to call and schedule Medicare AWV with Loma Sousa ([employer]) on 06/27/2019 after seeing Dr. [name].  Im waiting for a call back to either confirm or decline the appointment. If patient calls back, please update appointment notes.  VDM (Dee-Dee)

## 2019-06-06 ENCOUNTER — Other Ambulatory Visit: Payer: Self-pay | Admitting: Medical

## 2019-06-06 DIAGNOSIS — C7951 Secondary malignant neoplasm of bone: Secondary | ICD-10-CM

## 2019-06-06 DIAGNOSIS — C61 Malignant neoplasm of prostate: Secondary | ICD-10-CM

## 2019-06-06 MED ORDER — FENTANYL 25 MCG/HR TD PT72: 1.0000 | MEDICATED_PATCH | TRANSDERMAL | 0 refills | Status: DC

## 2019-06-17 ENCOUNTER — Other Ambulatory Visit: Payer: Medicare Other

## 2019-06-17 ENCOUNTER — Other Ambulatory Visit: Payer: Self-pay | Admitting: Family Medicine

## 2019-06-17 ENCOUNTER — Ambulatory Visit: Payer: Medicare Other

## 2019-06-24 ENCOUNTER — Other Ambulatory Visit: Payer: Medicare Other

## 2019-06-24 ENCOUNTER — Ambulatory Visit: Payer: Medicare Other

## 2019-06-25 ENCOUNTER — Other Ambulatory Visit: Payer: Self-pay

## 2019-06-25 MED ORDER — METOPROLOL SUCCINATE ER 25 MG PO TB24: 25.0000 mg | ORAL_TABLET | Freq: Every day | ORAL | 0 refills | Status: DC

## 2019-06-27 ENCOUNTER — Ambulatory Visit: Payer: Medicare Other

## 2019-06-27 ENCOUNTER — Ambulatory Visit: Payer: Medicare Other | Admitting: Family Medicine

## 2019-06-30 NOTE — Progress Notes (Signed)
HEMATOLOGY/ONCOLOGY CLINIC NOTE  Date of Service: 07/01/19    Patient Care Team: Tony Barrack, MD as PCP - General (Family Medicine)  CHIEF COMPLAINTS/PURPOSE OF CONSULTATION:  -recently diagnosed metastatic prostate cancer -Myelopthisic anemia  HISTORY OF PRESENTING ILLNESS:   Tony [name] is a wonderful 86 y.o. male who has been referred to [place] by Dr Tony [name] for evaluation and management of anemia. He is accompanied today by his daughter. The pt reports that he is doing well overall.   The pt reports a new onset of fatigue that began in January 2019. His daughter notes being able to tell a difference in his energy levels as early as November 2018. He notes that some cramping pain in his thighs. He notes that his recent 07/23/17 blood transfusion has led to a "terrific, immediate change" in his thigh pain. However, his thigh pain has returned in the last couple days.  He notes that he takes 1070mg Vitamin B12 daily.   He notes that for 6-7 years he took iron pills after his 2012 heart attack and stroke. He notes that he took a statin for 6-7 months and was taken off of it recently. He notes no unresolved symptoms from his stroke.   The pt notes that over the last 4-5 months his appetite has diminished and he has subsequently lost about 20 lbs in that time.  He notes that he has historically not preferred to drink water as such, but hydrates with juice, coffee, and other drinks.  He notes that he believes that he is able to take care of himself adequately at home. He also notes that his cane is sufficient for helping him get around.   Most recent lab results (07/22/17) of CBC  is as follows: all values are WNL except for RBC at 2.94, Hgb at 8.7, HCT at 27.2, RDW at 26.5, Platelets at 134k. CBC from 10/30/16 revealed all values WNL.  CMP 07/21/17 revealed all values WNL except for Sodium at 134, Glucose at 101, Creatinine at 1.85, Calcium at 7.9, Total Protein at 5.7, Albumin at  2.7, Alk Phos at 433.  LDH 07/20/17 elevated at 256. Haptoglobin 07/20/17 elevated at 357.  Vitamin B12 07/06/17 is WNL at 229.   On review of systems, pt reports decreased appetite, losing 20 lbs over 4-5 months, bilateral thigh pain, fatigue, mild leg swelling, and denies fevers, chills, night sweats, bone pains, nausea, abdominal pains, bleeding, blood in the urine, blood in the stools, black stools, changes in bowel habits, light headednss, dizziness, abdominal pains, noticing any new lumps or bumps, testicular pain or swelling, and any other symptoms.   On PMHx the pt reports heart attack and stroke in 2012. On Social Hx the pt denies much ETOH consumption.   Interval History:  Tony [name] today regarding his metastatic prostate cancer and myelopthisic anemia. We are joined by his daughter, Tony [name]. The patient's last visit with [place] on 05/23/2019. The pt reports that he is doing well overall.  The pt reports that he had his first dose of the COVID19 vaccine. He took 750 mg Zytiga per day for one week and has been taking 1000 mg Zytiga per day for the last three weeks. Pt denies any concerns with the vaccine or taking the increased dose of Zytiga. He did have some chills, fatigue for a couple of days, and a couple of hot flashes.   Lab results today (07/01/19) of CBC w/diff and CMP  is as follows: all values are WNL except for RBC at 3.27, Hgb at 10.7, HCT at 34.3, MCV at 105.2, BUN at 30, Creatinine at 1.98, Calcium at 8.4, ALP at 155, GFR Est Af Am at 34. 07/01/2019 Prostate-Specific-AG is in progress  07/01/2019 Vitamin D 25 hydroxy is in progress  On review of systems, pt reports chills, fatigue, hot flashes, healthy appetite and denies fevers, new bone pain, knee pain, unexpected weight loss, leg swelling, abdominal pain and any other symptoms.   MEDICAL HISTORY:  Past Medical History:  Diagnosis Date  . BPH associated with nocturia    flomax 0.4--> 0.8 mg  trial. nocturia if has coffee. some incontinence  . CAD (coronary artery disease)    LAD Stent 2012. 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   . Gout    no rx. apparently 1x in past  . History of stroke    no aspirin before stroke. no deficits. slurred words at time of stroke  . Hyperlipidemia   . Hypertension     SURGICAL HISTORY: Past Surgical History:  Procedure Laterality Date  . CARDIAC SURGERY     Stint  . CORONARY STENT PLACEMENT    . left arm fracture s/p surgery    . right leg fracture s.p surgery- screws like arm      SOCIAL HISTORY: Social History   Socioeconomic History  . Marital status: Married    Spouse name: Not on file  . Number of children: Not on file  . Years of education: Not on file  . Highest education level: Not on file  Occupational History  . Not on file  Tobacco Use  . Smoking status: Former Smoker    Packs/day: 0.50    Years: 1.00    Pack years: 0.50    Types: Cigarettes    Quit date: 05/01/1952    Years since quitting: 67.2  . Smokeless tobacco: Never Used  Substance and Sexual Activity  . Alcohol use: No  . Drug use: No  . Sexual activity: Not on file  Other Topics Concern  . Not on file  Social History Narrative   Married- lives separate from wife. 2 children. Daughter passed from heart attack.       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 Difficulty of Paying Living Expenses: Not on file  Food Insecurity:   . Worried About Charity fundraiser in the Last Year: Not on file  . Ran Out of Food in the Last Year: Not on file  Transportation Needs:   . Lack of Transportation (Medical): Not on file  . Lack of Transportation (Non-Medical): Not on file  Physical Activity:   . Days of Exercise per Week: Not on file  . Minutes of Exercise per Session: Not on file  Stress:   . Feeling of Stress : Not on file    Social Connections:   . Frequency of Communication with Friends and Family: Not on file  . Frequency of Social Gatherings with Friends and Family: Not on file  . Attends Religious Services: Not on file  . Active Member of Clubs or Organizations: Not on file  . Attends Archivist Meetings: Not on file  . Marital Status: Not on file  Intimate Partner Violence:   . Fear of Current or Ex-Partner: Not on file  .  Emotionally Abused: Not on file  . Physically Abused: Not on file  . Sexually Abused: Not on file    FAMILY HISTORY: Family History  Problem Relation Age of Onset  . Breast cancer Mother   . Heart disease Father   . Heart disease Brother        x2  . Prostate cancer Brother     ALLERGIES:  has No Known Allergies.  MEDICATIONS:  Current Outpatient Medications  Medication Sig Dispense Refill  . abiraterone acetate (ZYTIGA) 250 MG tablet Take 4 tablets (1,000 mg total) by mouth daily. Take on an empty stomach 1 hour before or 2 hours after a meal 120 tablet 2  . aspirin EC 81 MG tablet Take 1 tablet (81 mg total) by mouth daily. 90 tablet 3  . BIDIL 20-37.5 MG tablet TAKE 1 TABLET BY MOUTH TWICE DAILY 180 tablet 3  . calcium-vitamin D (OSCAL 500/200 D-3) 500-200 MG-UNIT tablet Take 1 tablet by mouth daily with breakfast. 30 tablet 2  . fentaNYL (DURAGESIC) 25 MCG/HR Place 1 patch onto the skin every 3 (three) days. 10 patch 0  . metoprolol succinate (TOPROL-XL) 25 MG 24 hr tablet Take 1 tablet (25 mg total) by mouth daily. 90 tablet 0  . Multiple Vitamins-Minerals (MULTI COMPLETE PO) Take by mouth.    . nitroGLYCERIN (NITROSTAT) 0.4 MG SL tablet Place 1 tablet (0.4 mg total) under the tongue every 5 (five) minutes as needed for chest pain (3 maximum before seeking care). 30 tablet 0  . Omega-3 1000 MG CAPS Take 1 capsule by mouth daily.     Marland Kitchen oxyCODONE (OXY IR/ROXICODONE) 5 MG immediate release tablet Take 1 tablet (5 mg total) by mouth every 4 (four) hours as needed  for severe pain. 90 tablet 0  . polyethylene glycol (MIRALAX) packet Take 17 g by mouth daily. 30 each 1  . predniSONE (DELTASONE) 5 MG tablet Take 1 tablet (5 mg total) by mouth 2 (two) times daily with a meal. 180 tablet 2  . senna-docusate (SENNA S) 8.6-50 MG tablet Take 2 tablets by mouth 2 (two) times daily. 120 tablet 2  . tamsulosin (FLOMAX) 0.4 MG CAPS capsule TAKE 1 CAPSULE BY MOUTH TWICE DAILY 180 capsule 3  . vitamin B-12 (CYANOCOBALAMIN) 1000 MCG tablet Take 1 tablet (1,000 mcg total) by mouth daily. 30 tablet 0  . Vitamin D, Ergocalciferol, (DRISDOL) 1.25 MG (50000 UT) CAPS capsule Take 1 capsule twice a week 24 capsule 3   No current facility-administered medications for this visit.    REVIEW OF SYSTEMS:   A 10+ POINT REVIEW OF SYSTEMS WAS OBTAINED including neurology, dermatology, psychiatry, cardiac, respiratory, lymph, extremities, GI, GU, Musculoskeletal, constitutional, breasts, reproductive, HEENT.  All pertinent positives are noted in the HPI.  All others are negative.   PHYSICAL EXAMINATION: ECOG FS:2 - Symptomatic, <50% confined to bed  Vitals:   07/01/19 1430  BP: (!) 155/77  Pulse: 85  Resp: 18  Temp: 98.5 F (36.9 C)  SpO2: 100%   Wt Readings from Last 3 Encounters:  07/01/19 176 lb 4.8 oz (80 kg)  05/23/19 175 lb 11.2 oz (79.7 kg)  01/28/19 172 lb 12.8 oz (78.4 kg)   Exam was given in a wheelchair   GENERAL:alert, in no acute distress and comfortable SKIN: no acute rashes, no significant lesions EYES: conjunctiva are pink and non-injected, sclera anicteric OROPHARYNX: MMM, no exudates, no oropharyngeal erythema or ulceration NECK: supple, no JVD LYMPH:  no palpable lymphadenopathy in the  cervical, axillary or inguinal regions LUNGS: clear to auscultation b/l with normal respiratory effort HEART: regular rate & rhythm ABDOMEN:  normoactive bowel sounds , non tender, not distended. No palpable hepatosplenomegaly.  Extremity: no pedal edema PSYCH:  alert & oriented x 3 with fluent speech NEURO: no focal motor/sensory deficits  LABORATORY DATA:  I have reviewed the data as listed  . CBC Latest Ref Rng & Units 07/01/2019 06/03/2019 05/23/2019  WBC 4.0 - 10.5 K/uL 6.0 5.6 6.1  Hemoglobin 13.0 - 17.0 g/dL 10.7(L) 10.1(L) 10.1(L)  Hematocrit 39.0 - 52.0 % 34.4(L) 33.5(L) 32.5(L)  Platelets 150 - 400 K/uL 185 157 163   . CBC    Component Value Date/Time   WBC 6.0 07/01/2019 1401   RBC 3.27 (L) 07/01/2019 1401   HGB 10.7 (L) 07/01/2019 1401   HGB 9.7 (L) 05/31/2018 1050   HCT 34.4 (L) 07/01/2019 1401   HCT 21.9 (L) 07/20/2017 2103   PLT 185 07/01/2019 1401   PLT 231 05/31/2018 1050   MCV 105.2 (H) 07/01/2019 1401   MCH 32.7 07/01/2019 1401   MCHC 31.1 07/01/2019 1401   RDW 13.5 07/01/2019 1401   LYMPHSABS 1.0 07/01/2019 1401   MONOABS 0.6 07/01/2019 1401   EOSABS 0.0 07/01/2019 1401   BASOSABS 0.0 07/01/2019 1401     . CMP Latest Ref Rng & Units 07/01/2019 06/03/2019 05/23/2019  Glucose 70 - 99 mg/dL 96 92 93  BUN 8 - 23 mg/dL 30(H) 29(H) 30(H)  Creatinine 0.61 - 1.24 mg/dL 1.98(H) 1.68(H) 1.68(H)  Sodium 135 - 145 mmol/L 138 140 139  Potassium 3.5 - 5.1 mmol/L 4.7 4.6 4.1  Chloride 98 - 111 mmol/L 102 109 106  CO2 22 - 32 mmol/L '26 24 24  '$ Calcium 8.9 - 10.3 mg/dL 8.4(L) 7.7(L) 7.7(L)  Total Protein 6.5 - 8.1 g/dL 7.2 6.4(L) 6.1(L)  Total Bilirubin 0.3 - 1.2 mg/dL 1.1 0.8 0.6  Alkaline Phos 38 - 126 U/L 155(H) 113 106  AST 15 - 41 U/L 20 13(L) 12(L)  ALT 0 - 44 U/L 25 11 <6    Component     Latest Ref Rng & Units 07/30/2017 09/06/2017 10/04/2017 10/31/2017  Prostate Specific Ag, Serum     0.0 - 4.0 ng/mL 1,186.0 (H) 433.8 (H) 67.4 (H) 55.8 (H)   07/31/17 BM Bx:   RADIOGRAPHIC STUDIES: I have personally reviewed the radiological images as listed and agreed with the findings in the report. No results found.  ASSESSMENT & PLAN:   86 y.o. male with  1. Normocytic Anemia - due to primarily metastatic prostatic cancer (ACD +  BM involvement with prostate cancer causing myelopthisic picture)  -Pt presented with a Hgb at 8.7 on 07/22/17, improved to 10.3 after receiving PRBC transfusion. -In review of the patient's previous CBC records, his anemia developed 6-7 months before presenting to care with me, unaccompanied by a drop in his EPO (07/20/17 elevated at 249.8);  LDH elevated  but haptoglobin at 357 on 07/20/17. -suggested against active hemolysis.  2. Recently diagnosed metastatic prostate cancer with extensive bone metastases. With bone mets/BM Mets and LNadenopathy. Exam positive for left retroperitoneal, bilateral pelvis and right inguinal adenopathy. Exam positive for left retroperitoneal, bilateral pelvis and right inguinal adenopathy. Diffuse sclerotic bone metastasis.   3. Elevated alkaline phosphatase due to bone metastases from prostate cancer -CT BM bx -concerning for significant bone lesions in lower spine and pelvis PSA levels have declined to 26.9  4. Cancer related pain - primarily in b/l  thighs. -much improved.  PLAN:  -Discussed pt labwork today, 07/01/19; Hgb has improved, kidney numbers and ALP are elevated, other blood counts and chemistries are stable  -Discussed 07/01/2019 Prostate-Specific-AG is in progress - will f/u as appropriate -Discussed 07/01/2019 Vitamin D 25 hydroxy is in progress -Elevated ALP may be due to Zytiga - not prohibitive at this time -The pt has no prohibitive toxicities from continuing 1000 mg Zytiga at this time.  -Will continue Zytiga until progression or intolerance -Recommend pt f/u as scheduled for the second dose of the COVID19 vaccine -Continue Xgeva q4weeks  -Continue Lupron q12weeks  -Will see back in 8 weeks with labs   FOLLOW UP: F/u for Xgeva q4weeks --plz schedule next 6 appointments Continue Lupron q12 weeks-- plz schedule next 4 appointments Labs every 4 weeks with each treatment RTC with Dr [name] with labs in 8 weeks   The total time spent in  the appt was 20 minutes and more than 50% was on counseling and direct patient cares.  All of the patient's questions were answered with apparent satisfaction. The patient knows to call the clinic with any problems, questions or concerns.    Sullivan Lone MD [place] AAHIVMS [facility] Hematology/Oncology Physician [facility]  (Office):       [phone] (Work cell):  [phone] (Fax):           [phone]  07/01/2019 4:02 PM  I, Yevette Edwards, am acting as a scribe for Dr. Sullivan Lone.   .I have reviewed the above documentation for accuracy and completeness, and I agree with the above. Brunetta Genera MD

## 2019-07-01 ENCOUNTER — Other Ambulatory Visit: Payer: Self-pay

## 2019-07-01 ENCOUNTER — Inpatient Hospital Stay: Payer: Medicare Other | Attending: Hematology

## 2019-07-01 ENCOUNTER — Inpatient Hospital Stay: Payer: Medicare Other

## 2019-07-01 ENCOUNTER — Other Ambulatory Visit: Payer: Medicare Other

## 2019-07-01 ENCOUNTER — Inpatient Hospital Stay (HOSPITAL_BASED_OUTPATIENT_CLINIC_OR_DEPARTMENT_OTHER): Payer: Medicare Other | Admitting: Hematology

## 2019-07-01 ENCOUNTER — Ambulatory Visit: Payer: Medicare Other

## 2019-07-01 VITALS — BP 155/77 | HR 85 | Temp 98.5°F | Resp 18 | Ht 67.0 in | Wt 176.3 lb

## 2019-07-01 DIAGNOSIS — C7951 Secondary malignant neoplasm of bone: Secondary | ICD-10-CM | POA: Insufficient documentation

## 2019-07-01 DIAGNOSIS — D649 Anemia, unspecified: Secondary | ICD-10-CM | POA: Insufficient documentation

## 2019-07-01 DIAGNOSIS — G893 Neoplasm related pain (acute) (chronic): Secondary | ICD-10-CM

## 2019-07-01 DIAGNOSIS — Z79899 Other long term (current) drug therapy: Secondary | ICD-10-CM | POA: Insufficient documentation

## 2019-07-01 DIAGNOSIS — C61 Malignant neoplasm of prostate: Secondary | ICD-10-CM

## 2019-07-01 DIAGNOSIS — R590 Localized enlarged lymph nodes: Secondary | ICD-10-CM | POA: Diagnosis not present

## 2019-07-01 LAB — CMP (CANCER CENTER ONLY)
ALT: 25 U/L (ref 0–44)
AST: 20 U/L (ref 15–41)
Albumin: 3.6 g/dL (ref 3.5–5.0)
Alkaline Phosphatase: 155 U/L — ABNORMAL HIGH (ref 38–126)
Anion gap: 10 (ref 5–15)
BUN: 30 mg/dL — ABNORMAL HIGH (ref 8–23)
CO2: 26 mmol/L (ref 22–32)
Calcium: 8.4 mg/dL — ABNORMAL LOW (ref 8.9–10.3)
Chloride: 102 mmol/L (ref 98–111)
Creatinine: 1.98 mg/dL — ABNORMAL HIGH (ref 0.61–1.24)
GFR, Est AFR Am: 34 mL/min — ABNORMAL LOW (ref >60)
GFR, Estimated: 29 mL/min — ABNORMAL LOW (ref >60)
Glucose, Bld: 96 mg/dL (ref 70–99)
Potassium: 4.7 mmol/L (ref 3.5–5.1)
Sodium: 138 mmol/L (ref 135–145)
Total Bilirubin: 1.1 mg/dL (ref 0.3–1.2)
Total Protein: 7.2 g/dL (ref 6.5–8.1)

## 2019-07-01 LAB — CBC WITH DIFFERENTIAL/PLATELET
Abs Immature Granulocytes: 0.05 10*3/uL (ref 0.00–0.07)
Basophils Absolute: 0 10*3/uL (ref 0.0–0.1)
Basophils Relative: 0 %
Eosinophils Absolute: 0 10*3/uL (ref 0.0–0.5)
Eosinophils Relative: 0 %
HCT: 34.4 % — ABNORMAL LOW (ref 39.0–52.0)
Hemoglobin: 10.7 g/dL — ABNORMAL LOW (ref 13.0–17.0)
Immature Granulocytes: 1 %
Lymphocytes Relative: 17 %
Lymphs Abs: 1 10*3/uL (ref 0.7–4.0)
MCH: 32.7 pg (ref 26.0–34.0)
MCHC: 31.1 g/dL (ref 30.0–36.0)
MCV: 105.2 fL — ABNORMAL HIGH (ref 80.0–100.0)
Monocytes Absolute: 0.6 10*3/uL (ref 0.1–1.0)
Monocytes Relative: 10 %
Neutro Abs: 4.2 10*3/uL (ref 1.7–7.7)
Neutrophils Relative %: 72 %
Platelets: 185 10*3/uL (ref 150–400)
RBC: 3.27 MIL/uL — ABNORMAL LOW (ref 4.22–5.81)
RDW: 13.5 % (ref 11.5–15.5)
WBC: 6 10*3/uL (ref 4.0–10.5)
nRBC: 0 % (ref 0.0–0.2)

## 2019-07-01 LAB — VITAMIN D 25 HYDROXY (VIT D DEFICIENCY, FRACTURES): Vit D, 25-Hydroxy: 121.18 ng/mL — ABNORMAL HIGH (ref 30–100)

## 2019-07-01 MED ORDER — DENOSUMAB 120 MG/1.7ML ~~LOC~~ SOLN
120.0000 mg | Freq: Once | SUBCUTANEOUS | Status: AC
  Administered 2019-07-01: 120 mg via SUBCUTANEOUS

## 2019-07-01 MED ORDER — DENOSUMAB 120 MG/1.7ML ~~LOC~~ SOLN
SUBCUTANEOUS | Status: AC
  Filled 2019-07-01: qty 1.7

## 2019-07-01 NOTE — Progress Notes (Signed)
Per Dr. [name] ok for [facility] today with calcium 8.4

## 2019-07-01 NOTE — Patient Instructions (Signed)
Denosumab injection What is this medicine? DENOSUMAB (den oh sue mab) slows bone breakdown. Prolia is used to treat osteoporosis in women after menopause and in men, and in people who are taking corticosteroids for 6 months or more. Xgeva is used to treat a high calcium level due to cancer and to prevent bone fractures and other bone problems caused by multiple myeloma or cancer bone metastases. Xgeva is also used to treat giant cell tumor of the bone. This medicine may be used for other purposes; ask your health care provider or pharmacist if you have questions. COMMON BRAND NAME(S): Prolia, XGEVA What should I tell my health care provider before I take this medicine? They need to know if you have any of these conditions:  dental disease  having surgery or tooth extraction  infection  kidney disease  low levels of calcium or Vitamin D in the blood  malnutrition  on hemodialysis  skin conditions or sensitivity  thyroid or parathyroid disease  an unusual reaction to denosumab, other medicines, foods, dyes, or preservatives  pregnant or trying to get pregnant  breast-feeding How should I use this medicine? This medicine is for injection under the skin. It is given by a health care professional in a hospital or clinic setting. A special MedGuide will be given to you before each treatment. Be sure to read this information carefully each time. For Prolia, talk to your pediatrician regarding the use of this medicine in children. Special care may be needed. For Xgeva, talk to your pediatrician regarding the use of this medicine in children. While this drug may be prescribed for children as young as 13 years for selected conditions, precautions do apply. Overdosage: If you think you have taken too much of this medicine contact a poison control center or emergency room at once. NOTE: This medicine is only for you. Do not share this medicine with others. What if I miss a dose? It is  important not to miss your dose. Call your doctor or health care professional if you are unable to keep an appointment. What may interact with this medicine? Do not take this medicine with any of the following medications:  other medicines containing denosumab This medicine may also interact with the following medications:  medicines that lower your chance of fighting infection  steroid medicines like prednisone or cortisone This list may not describe all possible interactions. Give your health care provider a list of all the medicines, herbs, non-prescription drugs, or dietary supplements you use. Also tell them if you smoke, drink alcohol, or use illegal drugs. Some items may interact with your medicine. What should I watch for while using this medicine? Visit your doctor or health care professional for regular checks on your progress. Your doctor or health care professional may order blood tests and other tests to see how you are doing. Call your doctor or health care professional for advice if you get a fever, chills or sore throat, or other symptoms of a cold or flu. Do not treat yourself. This drug may decrease your body's ability to fight infection. Try to avoid being around people who are sick. You should make sure you get enough calcium and vitamin D while you are taking this medicine, unless your doctor tells you not to. Discuss the foods you eat and the vitamins you take with your health care professional. See your dentist regularly. Brush and floss your teeth as directed. Before you have any dental work done, tell your dentist you are   receiving this medicine. Do not become pregnant while taking this medicine or for 5 months after stopping it. Talk with your doctor or health care professional about your birth control options while taking this medicine. Women should inform their doctor if they wish to become pregnant or think they might be pregnant. There is a potential for serious side  effects to an unborn child. Talk to your health care professional or pharmacist for more information. What side effects may I notice from receiving this medicine? Side effects that you should report to your doctor or health care professional as soon as possible:  allergic reactions like skin rash, itching or hives, swelling of the face, lips, or tongue  bone pain  breathing problems  dizziness  jaw pain, especially after dental work  redness, blistering, peeling of the skin  signs and symptoms of infection like fever or chills; cough; sore throat; pain or trouble passing urine  signs of low calcium like fast heartbeat, muscle cramps or muscle pain; pain, tingling, numbness in the hands or feet; seizures  unusual bleeding or bruising  unusually weak or tired Side effects that usually do not require medical attention (report to your doctor or health care professional if they continue or are bothersome):  constipation  diarrhea  headache  joint pain  loss of appetite  muscle pain  runny nose  tiredness  upset stomach This list may not describe all possible side effects. Call your doctor for medical advice about side effects. You may report side effects to FDA at 1-800-FDA-1088. Where should I keep my medicine? This medicine is only given in a clinic, doctor's office, or other health care setting and will not be stored at home. NOTE: This sheet is a summary. It may not cover all possible information. If you have questions about this medicine, talk to your doctor, pharmacist, or health care provider.  2020 Elsevier/Gold Standard (2017-08-24 16:10:44)

## 2019-07-02 LAB — PROSTATE-SPECIFIC AG, SERUM (LABCORP): Prostate Specific Ag, Serum: 162 ng/mL — ABNORMAL HIGH (ref 0.0–4.0)

## 2019-07-04 ENCOUNTER — Other Ambulatory Visit: Payer: Self-pay | Admitting: Hematology

## 2019-07-04 ENCOUNTER — Telehealth: Payer: Self-pay

## 2019-07-04 ENCOUNTER — Other Ambulatory Visit: Payer: Self-pay

## 2019-07-04 DIAGNOSIS — C7951 Secondary malignant neoplasm of bone: Secondary | ICD-10-CM

## 2019-07-04 DIAGNOSIS — C61 Malignant neoplasm of prostate: Secondary | ICD-10-CM

## 2019-07-04 MED ORDER — FENTANYL 25 MCG/HR TD PT72: 1.0000 | MEDICATED_PATCH | TRANSDERMAL | 0 refills | Status: DC

## 2019-07-04 MED ORDER — VITAMIN D (ERGOCALCIFEROL) 1.25 MG (50000 UNIT) PO CAPS: ORAL_CAPSULE | ORAL | 3 refills | Status: DC

## 2019-07-04 NOTE — Telephone Encounter (Signed)
Daughter called about patient's Drisdol and Fentanyl refill. Rx. request sent to Dr. [name]

## 2019-07-04 NOTE — Telephone Encounter (Signed)
Patients daughter called requesting refill.

## 2019-07-11 ENCOUNTER — Other Ambulatory Visit: Payer: Self-pay | Admitting: Pharmacist

## 2019-07-11 DIAGNOSIS — C61 Malignant neoplasm of prostate: Secondary | ICD-10-CM

## 2019-07-11 MED ORDER — ABIRATERONE ACETATE 250 MG PO TABS: 1000.0000 mg | ORAL_TABLET | Freq: Every day | ORAL | 0 refills | Status: DC

## 2019-07-11 MED FILL — ABIRATERONE ACETATE 250 MG: 250 | 30 days supply | Qty: 120 | Fill #0

## 2019-07-11 NOTE — Progress Notes (Signed)
Oral Chemotherapy Pharmacist Encounter   Patient elected to transfer out his prescription from [facility] in February to [facility]. This month he is having trouble filling his medication with [facility] and wants to go back to [facility]. The remaining refill on his prescription was redirected to [facility].  Darl Pikes, PharmD, BCPS, BCOP, CPP Hematology/Oncology Clinical Pharmacist [facility]/HP/AP [facility] [phone]  07/11/2019 3:09 PM

## 2019-07-14 ENCOUNTER — Telehealth: Payer: Self-pay | Admitting: Family Medicine

## 2019-07-14 ENCOUNTER — Telehealth: Payer: Self-pay | Admitting: Emergency Medicine

## 2019-07-14 NOTE — Telephone Encounter (Signed)
Patients daughter called in saying Tony [name] has an appointment this Friday but she received a call from his home health nurse and she states the patient has swelling in both feet and wanted to know if we could work him in sooner.

## 2019-07-14 NOTE — Telephone Encounter (Signed)
Patient scheduled.

## 2019-07-14 NOTE — Telephone Encounter (Signed)
Received call from pt's daughter [name] stating that home health aide just contacted her reporting new bilat swelling in pt's lower legs w/out redness/injury/pain.  Pt is not currently on Lasix, no cardiologist or hx of CHF.  Spoke with MD [name] who advised that [facility] contact pt's PCP and see if his appt on Friday w/them can be moved up to assess his legs.  If appt cannot be moved up [facility] advised to call CC back for possible evaluation by [facility] then.  She verbalized understanding and denies any further questions/concerns at this time.

## 2019-07-15 ENCOUNTER — Ambulatory Visit: Payer: Medicare Other

## 2019-07-15 ENCOUNTER — Other Ambulatory Visit: Payer: Medicare Other

## 2019-07-16 ENCOUNTER — Ambulatory Visit (INDEPENDENT_AMBULATORY_CARE_PROVIDER_SITE_OTHER): Payer: Medicare Other | Admitting: Family Medicine

## 2019-07-16 ENCOUNTER — Other Ambulatory Visit: Payer: Self-pay

## 2019-07-16 ENCOUNTER — Encounter: Payer: Self-pay | Admitting: Family Medicine

## 2019-07-16 VITALS — BP 160/90 | HR 92 | Temp 97.2°F | Ht 67.0 in | Wt 181.4 lb

## 2019-07-16 DIAGNOSIS — I872 Venous insufficiency (chronic) (peripheral): Secondary | ICD-10-CM

## 2019-07-16 DIAGNOSIS — I10 Essential (primary) hypertension: Secondary | ICD-10-CM | POA: Diagnosis not present

## 2019-07-16 DIAGNOSIS — M1710 Unilateral primary osteoarthritis, unspecified knee: Secondary | ICD-10-CM | POA: Diagnosis not present

## 2019-07-16 DIAGNOSIS — M179 Osteoarthritis of knee, unspecified: Secondary | ICD-10-CM | POA: Insufficient documentation

## 2019-07-16 MED ORDER — METHYLPREDNISOLONE ACETATE 80 MG/ML IJ SUSP
80.0000 mg | Freq: Once | INTRAMUSCULAR | Status: AC
  Administered 2019-07-16: 80 mg via INTRA_ARTICULAR

## 2019-07-16 MED ORDER — FUROSEMIDE 20 MG PO TABS: 20.0000 mg | ORAL_TABLET | Freq: Every day | ORAL | 3 refills | Status: DC

## 2019-07-16 MED ORDER — METOPROLOL SUCCINATE ER 25 MG PO TB24: 25.0000 mg | ORAL_TABLET | Freq: Every day | ORAL | 3 refills | Status: DC

## 2019-07-16 MED ORDER — FUROSEMIDE 20 MG PO TABS: 20.0000 mg | ORAL_TABLET | Freq: Every day | ORAL | 3 refills | Status: DC | PRN

## 2019-07-16 NOTE — Assessment & Plan Note (Signed)
Slightly above goal today though has typically been well controlled.  Possibly due to recent fall increase.  We will continue metoprolol 25 mg daily for now.  Also continue BiDil.  Continue home monitoring goal 150/90 or lower per JNC 8 guidelines.

## 2019-07-16 NOTE — Progress Notes (Signed)
   Tony [name] is a 86 y.o. male who presents today for an office visit.  Assessment/Plan:  Chronic Problems Addressed Today: Osteoarthritis of knee Bilateral injections performed today.  Tolerated well.  See below procedure note.  Venous insufficiency Discussed lifestyle modifications including salt avoidance, leg elevation, and regular exercise.  Will start Lasix 20mg  daily as needed.  Hypertension Slightly above goal today though has typically been well controlled.  Possibly due to recent fall increase.  We will continue metoprolol 25 mg daily for now.  Also continue BiDil.  Continue home monitoring goal 150/90 or lower per JNC 8 guidelines.    Subjective:  HPI:  Patient is here for follow-up visit.  Was last seen a couple of years ago.  He has noticed that he has having more swelling in his bilateral feet.  He was recently started on a new medication by his oncologist and was told that he may have some more fluid retention.  He also has had increased salt over the last few days.  He also has had worsening bilateral knee pain secondary to osteoarthritis.  He has tried patches with modest improvement.       Objective:  Physical Exam: BP (!) 160/90   Pulse 92   Temp (!) 97.2 F (36.2 C) (Temporal)   Ht 5\' 7"  (1.702 m)   Wt 181 lb 6.4 oz (82.3 kg)   SpO2 99%   BMI 28.41 kg/m   Wt Readings from Last 3 Encounters:  07/16/19 181 lb 6.4 oz (82.3 kg)  07/01/19 176 lb 4.8 oz (80 kg)  05/23/19 175 lb 11.2 oz (79.7 kg)  Gen: No acute distress, resting comfortably CV: Regular rate and rhythm with no murmurs appreciated Pulm: Normal work of breathing, clear to auscultation bilaterally with no crackles, wheezes, or rhonchi MSK: Bilateral 1+ pitting edema to knees bilaterally.  Neurovascular intact distally. Neuro: Grossly normal, moves all extremities Psych: Normal affect and thought content  Knee Injection Procedure Note  Indication: Symptom relief of bilateral Knee  Pain.  Procedure Details  Verbal consent was obtained for the procedure. The left joint was prepped with Betadine. Topical ethyl chloride was applied for anesthesia. A 25 gauge needle was inserted into the medial aspect of the joint.  3 ml 1% lidocaine and 1 ml of 80mg /cc Depo-Medrol was then injected into the joint. The needle was removed and the area cleansed and dressed.  This procedure was then repeated for the right knee.  Complications:  None; patient tolerated the procedure well.       [name]. [name], MD 07/16/2019 11:43 AM

## 2019-07-16 NOTE — Patient Instructions (Signed)
It was very nice to see you today!  We gave you a knee injection today.  You may have some stiffness over the next day or so as the fluid settles in.  Please use the Lasix as needed for swelling in your feet.  Please try to avoid salt and stay as active as possible.  Come back to see me in 6 months for your next checkup, or sooner if needed.  Take care, Dr [name]  Please try these tips to maintain a healthy lifestyle:   Eat at least 3 REAL meals and 1-2 snacks per day.  Aim for no more than 5 hours between eating.  If you eat breakfast, please do so within one hour of getting up.    Each meal should contain half fruits/vegetables, one quarter protein, and one quarter carbs (no bigger than a computer mouse)   Cut down on sweet beverages. This includes juice, soda, and sweet tea.     Drink at least 1 glass of water with each meal and aim for at least 8 glasses per day   Exercise at least 150 minutes every week.

## 2019-07-16 NOTE — Addendum Note (Signed)
Addended by: Betti Cruz on: 07/16/2019 01:40 PM   Modules accepted: Orders

## 2019-07-16 NOTE — Assessment & Plan Note (Signed)
Bilateral injections performed today.  Tolerated well.  See below procedure note.

## 2019-07-16 NOTE — Assessment & Plan Note (Signed)
Discussed lifestyle modifications including salt avoidance, leg elevation, and regular exercise.  Will start Lasix 20mg  daily as needed.

## 2019-07-18 ENCOUNTER — Ambulatory Visit: Payer: Medicare Other | Admitting: Family Medicine

## 2019-07-18 ENCOUNTER — Other Ambulatory Visit: Payer: Self-pay

## 2019-07-18 ENCOUNTER — Ambulatory Visit (INDEPENDENT_AMBULATORY_CARE_PROVIDER_SITE_OTHER): Payer: Medicare Other

## 2019-07-18 DIAGNOSIS — Z0000 Encounter for general adult medical examination without abnormal findings: Secondary | ICD-10-CM | POA: Diagnosis not present

## 2019-07-18 NOTE — Progress Notes (Signed)
Documentation was completed under the guidance of Denman George LPN

## 2019-07-18 NOTE — Patient Instructions (Addendum)
Tony [name] , Thank you for taking time to come for your Medicare Wellness Visit. I appreciate your ongoing commitment to your health goals. Please review the following plan we discussed and let me know if I can assist you in the future.   Screening recommendations/referrals: Colorectal Screening: denied  Vision and Dental Exams: Recommended annual ophthalmology exams for early detection of glaucoma and other disorders of the eye Recommended annual dental exams for proper oral hygiene  Vaccinations: Influenza vaccine: last injection given was 03/21/2018; will need for 2021 Pneumococcal vaccine: last injection was 09/01/2011; need second dosage  Tdap vaccine: Declined. Please call your insurance company to determine your out of pocket expense. You may also receive this vaccine at your local pharmacy or Health Dept. Shingles vaccine: Declined; Please call your insurance company to determine your out of pocket expense for the Shingrix vaccine. You may receive this vaccine at your local pharmacy.  Advanced directives: Advance directives discussed with you today. Please bring a copy of your POA (Power of [place]) and/or Living Will to your next appointment.   Goals:  Recommend to drink at least 6-8 8oz glasses of water per day. Recommend to exercise for at least 150 minutes per week. Recommend to remove any items from the home that may cause slips or trips. Recommend to decrease portion sizes by eating 3 small healthy meals and at least 2 healthy snacks per day. Recommend to begin DASH diet as directed below  Next appointment: Please schedule your Annual Wellness Visit with your Nurse Health Advisor in one year.  Preventive Care 2 Years and Older, Male Preventive care refers to lifestyle choices and visits with your health care provider that can promote health and wellness. What does preventive care include?  A yearly physical exam. This is also called an annual well check.  Dental  exams once or twice a year.  Routine eye exams. Ask your health care provider how often you should have your eyes checked.  Personal lifestyle choices, including:  Daily care of your teeth and gums.  Regular physical activity.  Eating a healthy diet.  Avoiding tobacco and drug use.  Limiting alcohol use.  Practicing safe sex.  Taking low doses of aspirin every day if recommended by your health care provider..  Taking vitamin and mineral supplements as recommended by your health care provider. What happens during an annual well check? The services and screenings done by your health care provider during your annual well check will depend on your age, overall health, lifestyle risk factors, and family history of disease. Counseling  Your health care provider may ask you questions about your:  Alcohol use.  Tobacco use.  Drug use.  Emotional well-being.  Home and relationship well-being.  Sexual activity.  Eating habits.  History of falls.  Memory and ability to understand (cognition).  Work and work Statistician. Screening  You may have the following tests or measurements:  Height, weight, and BMI.  Blood pressure.  Lipid and cholesterol levels. These may be checked every 5 years, or more frequently if you are over 38 years old.  Skin check.  Lung cancer screening. You may have this screening every year starting at age 99 if you have a 30-pack-year history of smoking and currently smoke or have quit within the past 15 years.  Fecal occult blood test (FOBT) of the stool. You may have this test every year starting at age 69.  Flexible sigmoidoscopy or colonoscopy. You may have a sigmoidoscopy every 5 years  or a colonoscopy every 10 years starting at age 27.  Prostate cancer screening. Recommendations will vary depending on your family history and other risks.  Hepatitis C blood test.  Hepatitis B blood test.  Sexually transmitted disease (STD)  testing.  Diabetes screening. This is done by checking your blood sugar (glucose) after you have not eaten for a while (fasting). You may have this done every 1-3 years.  Abdominal aortic aneurysm (AAA) screening. You may need this if you are a current or former smoker.  Osteoporosis. You may be screened starting at age 37 if you are at high risk. Talk with your health care provider about your test results, treatment options, and if necessary, the need for more tests. Vaccines  Your health care provider may recommend certain vaccines, such as:  Influenza vaccine. This is recommended every year.  Tetanus, diphtheria, and acellular pertussis (Tdap, Td) vaccine. You may need a Td booster every 10 years.  Zoster vaccine. You may need this after age 79.  Pneumococcal 13-valent conjugate (PCV13) vaccine. One dose is recommended after age 63.  Pneumococcal polysaccharide (PPSV23) vaccine. One dose is recommended after age 61. Talk to your health care provider about which screenings and vaccines you need and how often you need them. This information is not intended to replace advice given to you by your health care provider. Make sure you discuss any questions you have with your health care provider. Document Released: 05/14/2015 Document Revised: 01/05/2016 Document Reviewed: 02/16/2015 Elsevier Interactive Patient Education  2017 [place] Prevention in the Home Falls can cause injuries. They can happen to people of all ages. There are many things you can do to make your home safe and to help prevent falls. What can I do on the outside of my home?  Regularly fix the edges of walkways and driveways and fix any cracks.  Remove anything that might make you trip as you walk through a door, such as a raised step or threshold.  Trim any bushes or trees on the path to your home.  Use bright outdoor lighting.  Clear any walking paths of anything that might make someone trip, such as  rocks or tools.  Regularly check to see if handrails are loose or broken. Make sure that both sides of any steps have handrails.  Any raised decks and porches should have guardrails on the edges.  Have any leaves, snow, or ice cleared regularly.  Use sand or salt on walking paths during winter.  Clean up any spills in your garage right away. This includes oil or grease spills. What can I do in the bathroom?  Use night lights.  Install grab bars by the toilet and in the tub and shower. Do not use towel bars as grab bars.  Use non-skid mats or decals in the tub or shower.  If you need to sit down in the shower, use a plastic, non-slip stool.  Keep the floor dry. Clean up any water that spills on the floor as soon as it happens.  Remove soap buildup in the tub or shower regularly.  Attach bath mats securely with double-sided non-slip rug tape.  Do not have throw rugs and other things on the floor that can make you trip. What can I do in the bedroom?  Use night lights.  Make sure that you have a light by your bed that is easy to reach.  Do not use any sheets or blankets that are too big for your  bed. They should not hang down onto the floor.  Have a firm chair that has side arms. You can use this for support while you get dressed.  Do not have throw rugs and other things on the floor that can make you trip. What can I do in the kitchen?  Clean up any spills right away.  Avoid walking on wet floors.  Keep items that you use a lot in easy-to-reach places.  If you need to reach something above you, use a strong step stool that has a grab bar.  Keep electrical cords out of the way.  Do not use floor polish or wax that makes floors slippery. If you must use wax, use non-skid floor wax.  Do not have throw rugs and other things on the floor that can make you trip. What can I do with my stairs?  Do not leave any items on the stairs.  Make sure that there are handrails on  both sides of the stairs and use them. Fix handrails that are broken or loose. Make sure that handrails are as long as the stairways.  Check any carpeting to make sure that it is firmly attached to the stairs. Fix any carpet that is loose or worn.  Avoid having throw rugs at the top or bottom of the stairs. If you do have throw rugs, attach them to the floor with carpet tape.  Make sure that you have a light switch at the top of the stairs and the bottom of the stairs. If you do not have them, ask someone to add them for you. What else can I do to help prevent falls?  Wear shoes that:  Do not have high heels.  Have rubber bottoms.  Are comfortable and fit you well.  Are closed at the toe. Do not wear sandals.  If you use a stepladder:  Make sure that it is fully opened. Do not climb a closed stepladder.  Make sure that both sides of the stepladder are locked into place.  Ask someone to hold it for you, if possible.  Clearly mark and make sure that you can see:  Any grab bars or handrails.  First and last steps.  Where the edge of each step is.  Use tools that help you move around (mobility aids) if they are needed. These include:  Canes.  Walkers.  Scooters.  Crutches.  Turn on the lights when you go into a dark area. Replace any light bulbs as soon as they burn out.  Set up your furniture so you have a clear path. Avoid moving your furniture around.  If any of your floors are uneven, fix them.  If there are any pets around you, be aware of where they are.  Review your medicines with your doctor. Some medicines can make you feel dizzy. This can increase your chance of falling. Ask your doctor what other things that you can do to help prevent falls. This information is not intended to replace advice given to you by your health care provider. Make sure you discuss any questions you have with your health care provider. Document Released: 02/11/2009 Document  Revised: 09/23/2015 Document Reviewed: 05/22/2014 Elsevier Interactive Patient Education  2017 [employer].

## 2019-07-18 NOTE — Progress Notes (Signed)
This visit is being conducted via phone call due to the COVID-19 pandemic. This patient has given me verbal consent via phone to conduct this visit, patient states they are participating from their home address. Some vital signs may be absent or patient reported.   Patient identification: identified by name, DOB, and current address.  Location provider: [facility], Office Persons participating in the virtual visit: Julio Sicks. (daughter and POA), Lisette Abu, LPN (Nurse Health Advisor)    Subjective:   Tony [name] is a 86 y.o. male who presents for Medicare Annual/Subsequent preventive examination.  Review of Systems:   Cardiac Risk Factors include: advanced age (>29men, >42 women);hypertension;male gender     Objective:    Vitals: There were no vitals taken for this visit.  There is no height or weight on file to calculate BMI.  Advanced Directives 07/18/2019 05/23/2019 05/31/2018 04/18/2018 12/27/2017 11/29/2017 10/31/2017  Does Patient Have a Medical Advance Directive? Yes Yes Yes No No No No  Type of Paramedic of [place];Living will Healthcare Power of [place] - - - -  Does patient want to make changes to medical advance directive? No - Patient declined No - Patient declined - - - - -  Copy of [place] in Chart? No - copy requested No - copy requested No - copy requested - - - -  Would patient like information on creating a medical advance directive? - No - Patient declined No - Patient declined No - Patient declined No - Patient declined No - Patient declined No - Patient declined    Tobacco Social History   Tobacco Use  Smoking Status Former Smoker  . Packs/day: 0.50  . Years: 1.00  . Pack years: 0.50  . Types: Cigarettes  . Quit date: 05/01/1952  . Years since quitting: 67.2  Smokeless Tobacco Never Used     Counseling given: Not Answered   Past Medical History:  Diagnosis Date  . BPH  associated with nocturia    flomax 0.4--> 0.8 mg trial. nocturia if has coffee. some incontinence  . CAD (coronary artery disease)    LAD Stent 2012. 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   . Gout    no rx. apparently 1x in past  . History of stroke    no aspirin before stroke. no deficits. slurred words at time of stroke  . Hyperlipidemia   . Hypertension    Past Surgical History:  Procedure Laterality Date  . CARDIAC SURGERY     Stint  . CORONARY STENT PLACEMENT    . left arm fracture s/p surgery    . right leg fracture s.p surgery- screws like arm     Family History  Problem Relation Age of Onset  . Breast cancer Mother   . Heart disease Father   . Heart disease Brother        x2  . Prostate cancer Brother    Social History   Socioeconomic History  . Marital status: Married    Spouse name: Not on file  . Number of children: Not on file  . Years of education: Not on file  . Highest education level: Not on file  Occupational History  . Not on file  Tobacco Use  . Smoking status: Former Smoker    Packs/day: 0.50    Years: 1.00    Pack years: 0.50    Types: Cigarettes    Quit  date: 05/01/1952    Years since quitting: 67.2  . Smokeless tobacco: Never Used  Substance and Sexual Activity  . Alcohol use: No  . Drug use: No  . Sexual activity: Not on file  Other Topics Concern  . Not on file  Social History Narrative   Married- lives separate from wife. 2 children. Daughter passed from heart attack.       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 Difficulty of Paying Living Expenses:   Food Insecurity:   . Worried About Charity fundraiser in the Last Year:   . Arboriculturist in the Last Year:   Transportation Needs:   . Film/video editor (Medical):   Marland Kitchen Lack of Transportation (Non-Medical):   Physical Activity:   . Days of Exercise  per Week:   . Minutes of Exercise per Session:   Stress:   . Feeling of Stress :   Social Connections:   . Frequency of Communication with Friends and Family:   . Frequency of Social Gatherings with Friends and Family:   . Attends Religious Services:   . Active Member of Clubs or Organizations:   . Attends Archivist Meetings:   Marland Kitchen Marital Status:     Outpatient Encounter Medications as of 07/18/2019  Medication Sig  . abiraterone acetate (ZYTIGA) 250 MG tablet Take 4 tablets (1,000 mg total) by mouth daily. Take on an empty stomach 1 hour before or 2 hours after a meal  . aspirin EC 81 MG tablet Take 1 tablet (81 mg total) by mouth daily.  Marland Kitchen BIDIL 20-37.5 MG tablet TAKE 1 TABLET BY MOUTH TWICE DAILY  . calcium-vitamin D (OSCAL 500/200 D-3) 500-200 MG-UNIT tablet Take 1 tablet by mouth daily with breakfast.  . fentaNYL (DURAGESIC) 25 MCG/HR Place 1 patch onto the skin every 3 (three) days.  . furosemide (LASIX) 20 MG tablet Take 1 tablet (20 mg total) by mouth daily as needed for fluid or edema.  . metoprolol succinate (TOPROL-XL) 25 MG 24 hr tablet Take 1 tablet (25 mg total) by mouth daily.  . Multiple Vitamins-Minerals (MULTI COMPLETE PO) Take by mouth.  . nitroGLYCERIN (NITROSTAT) 0.4 MG SL tablet Place 1 tablet (0.4 mg total) under the tongue every 5 (five) minutes as needed for chest pain (3 maximum before seeking care).  . Omega-3 1000 MG CAPS Take 1 capsule by mouth daily.   Marland Kitchen oxyCODONE (OXY IR/ROXICODONE) 5 MG immediate release tablet Take 1 tablet (5 mg total) by mouth every 4 (four) hours as needed for severe pain.  . polyethylene glycol (MIRALAX) packet Take 17 g by mouth daily.  . predniSONE (DELTASONE) 5 MG tablet Take 1 tablet (5 mg total) by mouth 2 (two) times daily with a meal.  . senna-docusate (SENNA S) 8.6-50 MG tablet Take 2 tablets by mouth 2 (two) times daily.  . tamsulosin (FLOMAX) 0.4 MG CAPS capsule TAKE 1 CAPSULE BY MOUTH TWICE DAILY  . vitamin B-12  (CYANOCOBALAMIN) 1000 MCG tablet Take 1 tablet (1,000 mcg total) by mouth daily.  . Vitamin D, Ergocalciferol, (DRISDOL) 1.25 MG (50000 UNIT) CAPS capsule Take 1 capsule Monday, Wednesday and Friday.   No facility-administered encounter medications on file as of 07/18/2019.    Activities of Daily Living No flowsheet data found.  Patient Care Team: [name], MD as PCP - General (Family  Medicine)   Assessment:   This is a routine wellness examination for [place].  Exercise Activities and Dietary recommendations Current Exercise Habits: The patient does not participate in regular exercise at present, Exercise limited by: orthopedic condition(s)  Goals   None     Fall Risk Fall Risk  07/18/2019 10/30/2016  Falls in the past year? 0 No  Number falls in past yr: 0 -  Injury with Fall? 1 -  Risk for fall due to : Impaired balance/gait;History of fall(s) -  Follow up Falls evaluation completed;Falls prevention discussed;Education provided -   Is the patient's home free of loose throw rugs in walkways, pet beds, electrical cords, etc?   yes      Grab bars in the bathroom? yes      Handrails on the stairs?   yes      Adequate lighting?   yes    Depression Screen PHQ 2/9 Scores 07/18/2019 10/30/2016  PHQ - 2 Score 0 0          Immunization History  Administered Date(s) Administered  . Influenza,inj,Quad PF,6+ Mos 03/14/2012, 03/21/2018  . PFIZER SARS-COV-2 Vaccination 06/14/2019, 07/09/2019  . Pneumococcal Polysaccharide-23 09/01/2011  . Tdap 09/01/2011    Qualifies for Shingles Vaccine? Will discuss at later appointment with Dr. [name]  Screening Tests Health Maintenance  Topic Date Due  . INFLUENZA VACCINE  07/30/2019 (Originally 11/30/2018)  . PNA vac Low Risk Adult (2 of 2 - PCV13) 07/15/2020 (Originally 08/31/2012)  . TETANUS/TDAP  08/31/2021   Cancer Screenings: Lung: Low Dose CT Chest recommended if Age 78-80 years, 30 pack-year currently smoking OR have quit w/in  15years. Patient does not qualify. Colorectal: denied        Plan:     I have personally reviewed and noted the following in the patient's chart:   . Medical and social history . Use of alcohol, tobacco or illicit drugs  . Current medications and supplements . Functional ability and status . Nutritional status . Physical activity . Advanced directives . List of other physicians . Hospitalizations, surgeries, and ER visits in previous 12 months . Vitals . Screenings to include cognitive, depression, and falls . Referrals and appointments  In addition, I have reviewed and discussed with patient certain preventive protocols, quality metrics, and best practice recommendations. A written personalized care plan for preventive services as well as general preventive health recommendations were provided to patient.     Sheral Flow, LPN  Nurse Health Advisor  07/18/2019

## 2019-07-22 ENCOUNTER — Ambulatory Visit: Payer: Medicare Other

## 2019-07-22 ENCOUNTER — Other Ambulatory Visit: Payer: Medicare Other

## 2019-07-29 ENCOUNTER — Inpatient Hospital Stay: Payer: Medicare Other

## 2019-07-29 ENCOUNTER — Other Ambulatory Visit: Payer: Self-pay

## 2019-07-29 VITALS — BP 146/72 | HR 84 | Temp 99.1°F | Resp 18

## 2019-07-29 DIAGNOSIS — D649 Anemia, unspecified: Secondary | ICD-10-CM | POA: Diagnosis not present

## 2019-07-29 DIAGNOSIS — G893 Neoplasm related pain (acute) (chronic): Secondary | ICD-10-CM

## 2019-07-29 DIAGNOSIS — C7951 Secondary malignant neoplasm of bone: Secondary | ICD-10-CM

## 2019-07-29 DIAGNOSIS — C61 Malignant neoplasm of prostate: Secondary | ICD-10-CM

## 2019-07-29 DIAGNOSIS — D6182 Myelophthisis: Secondary | ICD-10-CM

## 2019-07-29 DIAGNOSIS — Z79899 Other long term (current) drug therapy: Secondary | ICD-10-CM | POA: Diagnosis not present

## 2019-07-29 DIAGNOSIS — R590 Localized enlarged lymph nodes: Secondary | ICD-10-CM | POA: Diagnosis not present

## 2019-07-29 LAB — CMP (CANCER CENTER ONLY)
ALT: 12 U/L (ref 0–44)
AST: 16 U/L (ref 15–41)
Albumin: 3.4 g/dL — ABNORMAL LOW (ref 3.5–5.0)
Alkaline Phosphatase: 171 U/L — ABNORMAL HIGH (ref 38–126)
Anion gap: 7 (ref 5–15)
BUN: 33 mg/dL — ABNORMAL HIGH (ref 8–23)
CO2: 25 mmol/L (ref 22–32)
Calcium: 8.2 mg/dL — ABNORMAL LOW (ref 8.9–10.3)
Chloride: 104 mmol/L (ref 98–111)
Creatinine: 1.88 mg/dL — ABNORMAL HIGH (ref 0.61–1.24)
GFR, Est AFR Am: 36 mL/min — ABNORMAL LOW (ref >60)
GFR, Estimated: 31 mL/min — ABNORMAL LOW (ref >60)
Glucose, Bld: 129 mg/dL — ABNORMAL HIGH (ref 70–99)
Potassium: 4.6 mmol/L (ref 3.5–5.1)
Sodium: 136 mmol/L (ref 135–145)
Total Bilirubin: 0.9 mg/dL (ref 0.3–1.2)
Total Protein: 6.3 g/dL — ABNORMAL LOW (ref 6.5–8.1)

## 2019-07-29 LAB — CBC WITH DIFFERENTIAL/PLATELET
Abs Immature Granulocytes: 0.09 10*3/uL — ABNORMAL HIGH (ref 0.00–0.07)
Basophils Absolute: 0 10*3/uL (ref 0.0–0.1)
Basophils Relative: 0 %
Eosinophils Absolute: 0 10*3/uL (ref 0.0–0.5)
Eosinophils Relative: 0 %
HCT: 34.8 % — ABNORMAL LOW (ref 39.0–52.0)
Hemoglobin: 10.7 g/dL — ABNORMAL LOW (ref 13.0–17.0)
Immature Granulocytes: 2 %
Lymphocytes Relative: 23 %
Lymphs Abs: 1.3 10*3/uL (ref 0.7–4.0)
MCH: 32.6 pg (ref 26.0–34.0)
MCHC: 30.7 g/dL (ref 30.0–36.0)
MCV: 106.1 fL — ABNORMAL HIGH (ref 80.0–100.0)
Monocytes Absolute: 0.6 10*3/uL (ref 0.1–1.0)
Monocytes Relative: 10 %
Neutro Abs: 3.7 10*3/uL (ref 1.7–7.7)
Neutrophils Relative %: 65 %
Platelets: 158 10*3/uL (ref 150–400)
RBC: 3.28 MIL/uL — ABNORMAL LOW (ref 4.22–5.81)
RDW: 14.1 % (ref 11.5–15.5)
WBC: 5.7 10*3/uL (ref 4.0–10.5)
nRBC: 0.4 % — ABNORMAL HIGH (ref 0.0–0.2)

## 2019-07-29 MED ORDER — LEUPROLIDE ACETATE (3 MONTH) 22.5 MG ~~LOC~~ KIT
22.5000 mg | PACK | Freq: Once | SUBCUTANEOUS | Status: AC
  Administered 2019-07-29: 11:00:00 22.5 mg via SUBCUTANEOUS
  Filled 2019-07-29: qty 22.5

## 2019-07-29 MED ORDER — DENOSUMAB 120 MG/1.7ML ~~LOC~~ SOLN
120.0000 mg | Freq: Once | SUBCUTANEOUS | Status: AC
  Administered 2019-07-29: 120 mg via SUBCUTANEOUS

## 2019-07-29 MED ORDER — DENOSUMAB 120 MG/1.7ML ~~LOC~~ SOLN
SUBCUTANEOUS | Status: AC
  Filled 2019-07-29: qty 1.7

## 2019-07-29 NOTE — Progress Notes (Signed)
Corr Ca = 8.68 OK to give Xgeva today per Dr. [name].  Kennith Center, Pharm.D., CPP 07/29/2019@11 :09 AM

## 2019-07-29 NOTE — Patient Instructions (Signed)
Leuprolide depot injection What is this medicine? LEUPROLIDE (loo PROE lide) is a man-made protein that acts like a natural hormone in the body. It decreases testosterone in men and decreases estrogen in women. In men, this medicine is used to treat advanced prostate cancer. In women, some forms of this medicine may be used to treat endometriosis, uterine fibroids, or other male hormone-related problems. This medicine may be used for other purposes; ask your health care provider or pharmacist if you have questions. COMMON BRAND NAME(S): Eligard, Fensolv, Lupron Depot, Lupron Depot-Ped, Viadur What should I tell my health care provider before I take this medicine? They need to know if you have any of these conditions:  diabetes  heart disease or previous heart attack  high blood pressure  high cholesterol  mental illness  osteoporosis  pain or difficulty passing urine  seizures  spinal cord metastasis  stroke  suicidal thoughts, plans, or attempt; a previous suicide attempt by you or a family member  tobacco smoker  unusual vaginal bleeding (women)  an unusual or allergic reaction to leuprolide, benzyl alcohol, other medicines, foods, dyes, or preservatives  pregnant or trying to get pregnant  breast-feeding How should I use this medicine? This medicine is for injection into a muscle or for injection under the skin. It is given by a health care professional in a hospital or clinic setting. The specific product will determine how it will be given to you. Make sure you understand which product you receive and how often you will receive it. Talk to your pediatrician regarding the use of this medicine in children. Special care may be needed. Overdosage: If you think you have taken too much of this medicine contact a poison control center or emergency room at once. NOTE: This medicine is only for you. Do not share this medicine with others. What if I miss a dose? It is  important not to miss a dose. Call your doctor or health care professional if you are unable to keep an appointment. Depot injections: Depot injections are given either once-monthly, every 12 weeks, every 16 weeks, or every 24 weeks depending on the product you are prescribed. The product you are prescribed will be based on if you are male or male, and your condition. Make sure you understand your product and dosing. What may interact with this medicine? Do not take this medicine with any of the following medications:  chasteberry This medicine may also interact with the following medications:  herbal or dietary supplements, like black cohosh or DHEA  male hormones, like estrogens or progestins and birth control pills, patches, rings, or injections  male hormones, like testosterone This list may not describe all possible interactions. Give your health care provider a list of all the medicines, herbs, non-prescription drugs, or dietary supplements you use. Also tell them if you smoke, drink alcohol, or use illegal drugs. Some items may interact with your medicine. What should I watch for while using this medicine? Visit your doctor or health care professional for regular checks on your progress. During the first weeks of treatment, your symptoms may get worse, but then will improve as you continue your treatment. You may get hot flashes, increased bone pain, increased difficulty passing urine, or an aggravation of nerve symptoms. Discuss these effects with your doctor or health care professional, some of them may improve with continued use of this medicine. Male patients may experience a menstrual cycle or spotting during the first months of therapy with this medicine.   If this continues, contact your doctor or health care professional. This medicine may increase blood sugar. Ask your healthcare provider if changes in diet or medicines are needed if you have diabetes. What side effects may I  notice from receiving this medicine? Side effects that you should report to your doctor or health care professional as soon as possible:  allergic reactions like skin rash, itching or hives, swelling of the face, lips, or tongue  breathing problems  chest pain  depression or memory disorders  pain in your legs or groin  pain at site where injected or implanted  seizures  severe headache  signs and symptoms of high blood sugar such as being more thirsty or hungry or having to urinate more than normal. You may also feel very tired or have blurry vision  swelling of the feet and legs  suicidal thoughts or other mood changes  visual changes  vomiting Side effects that usually do not require medical attention (report to your doctor or health care professional if they continue or are bothersome):  breast swelling or tenderness  decrease in sex drive or performance  diarrhea  hot flashes  loss of appetite  muscle, joint, or bone pains  nausea  redness or irritation at site where injected or implanted  skin problems or acne This list may not describe all possible side effects. Call your doctor for medical advice about side effects. You may report side effects to FDA at 1-800-FDA-1088. Where should I keep my medicine? This drug is given in a hospital or clinic and will not be stored at home. NOTE: This sheet is a summary. It may not cover all possible information. If you have questions about this medicine, talk to your doctor, pharmacist, or health care provider.  2020 [employer]/Gold Standard (2018-02-14 09:27:03) Denosumab injection What is this medicine? DENOSUMAB (den oh sue mab) slows bone breakdown. Prolia is used to treat osteoporosis in women after menopause and in men, and in people who are taking corticosteroids for 6 months or more. Xgeva is used to treat a high calcium level due to cancer and to prevent bone fractures and other bone problems caused by multiple  myeloma or cancer bone metastases. Xgeva is also used to treat giant cell tumor of the bone. This medicine may be used for other purposes; ask your health care provider or pharmacist if you have questions. COMMON BRAND NAME(S): Prolia, XGEVA What should I tell my health care provider before I take this medicine? They need to know if you have any of these conditions:  dental disease  having surgery or tooth extraction  infection  kidney disease  low levels of calcium or Vitamin D in the blood  malnutrition  on hemodialysis  skin conditions or sensitivity  thyroid or parathyroid disease  an unusual reaction to denosumab, other medicines, foods, dyes, or preservatives  pregnant or trying to get pregnant  breast-feeding How should I use this medicine? This medicine is for injection under the skin. It is given by a health care professional in a hospital or clinic setting. A special MedGuide will be given to you before each treatment. Be sure to read this information carefully each time. For Prolia, talk to your pediatrician regarding the use of this medicine in children. Special care may be needed. For Xgeva, talk to your pediatrician regarding the use of this medicine in children. While this drug may be prescribed for children as young as 13 years for selected conditions, precautions do apply. Overdosage:   If you think you have taken too much of this medicine contact a poison control center or emergency room at once. NOTE: This medicine is only for you. Do not share this medicine with others. What if I miss a dose? It is important not to miss your dose. Call your doctor or health care professional if you are unable to keep an appointment. What may interact with this medicine? Do not take this medicine with any of the following medications:  other medicines containing denosumab This medicine may also interact with the following medications:  medicines that lower your chance of  fighting infection  steroid medicines like prednisone or cortisone This list may not describe all possible interactions. Give your health care provider a list of all the medicines, herbs, non-prescription drugs, or dietary supplements you use. Also tell them if you smoke, drink alcohol, or use illegal drugs. Some items may interact with your medicine. What should I watch for while using this medicine? Visit your doctor or health care professional for regular checks on your progress. Your doctor or health care professional may order blood tests and other tests to see how you are doing. Call your doctor or health care professional for advice if you get a fever, chills or sore throat, or other symptoms of a cold or flu. Do not treat yourself. This drug may decrease your body's ability to fight infection. Try to avoid being around people who are sick. You should make sure you get enough calcium and vitamin D while you are taking this medicine, unless your doctor tells you not to. Discuss the foods you eat and the vitamins you take with your health care professional. See your dentist regularly. Brush and floss your teeth as directed. Before you have any dental work done, tell your dentist you are receiving this medicine. Do not become pregnant while taking this medicine or for 5 months after stopping it. Talk with your doctor or health care professional about your birth control options while taking this medicine. Women should inform their doctor if they wish to become pregnant or think they might be pregnant. There is a potential for serious side effects to an unborn child. Talk to your health care professional or pharmacist for more information. What side effects may I notice from receiving this medicine? Side effects that you should report to your doctor or health care professional as soon as possible:  allergic reactions like skin rash, itching or hives, swelling of the face, lips, or tongue  bone  pain  breathing problems  dizziness  jaw pain, especially after dental work  redness, blistering, peeling of the skin  signs and symptoms of infection like fever or chills; cough; sore throat; pain or trouble passing urine  signs of low calcium like fast heartbeat, muscle cramps or muscle pain; pain, tingling, numbness in the hands or feet; seizures  unusual bleeding or bruising  unusually weak or tired Side effects that usually do not require medical attention (report to your doctor or health care professional if they continue or are bothersome):  constipation  diarrhea  headache  joint pain  loss of appetite  muscle pain  runny nose  tiredness  upset stomach This list may not describe all possible side effects. Call your doctor for medical advice about side effects. You may report side effects to FDA at 1-800-FDA-1088. Where should I keep my medicine? This medicine is only given in a clinic, doctor's office, or other health care setting and will not be   stored at home. NOTE: This sheet is a summary. It may not cover all possible information. If you have questions about this medicine, talk to your doctor, pharmacist, or health care provider.  2020 [employer]/Gold Standard (2017-08-24 16:10:44)  

## 2019-07-30 LAB — PROSTATE-SPECIFIC AG, SERUM (LABCORP): Prostate Specific Ag, Serum: 181 ng/mL — ABNORMAL HIGH (ref 0.0–4.0)

## 2019-08-01 ENCOUNTER — Other Ambulatory Visit: Payer: Self-pay | Admitting: Hematology

## 2019-08-01 DIAGNOSIS — C61 Malignant neoplasm of prostate: Secondary | ICD-10-CM

## 2019-08-02 ENCOUNTER — Other Ambulatory Visit: Payer: Self-pay | Admitting: Hematology

## 2019-08-02 DIAGNOSIS — C61 Malignant neoplasm of prostate: Secondary | ICD-10-CM

## 2019-08-04 ENCOUNTER — Other Ambulatory Visit: Payer: Self-pay | Admitting: Pharmacist

## 2019-08-04 DIAGNOSIS — C61 Malignant neoplasm of prostate: Secondary | ICD-10-CM

## 2019-08-04 MED ORDER — ABIRATERONE ACETATE 250 MG PO TABS: 1000.0000 mg | ORAL_TABLET | Freq: Every day | ORAL | 0 refills | Status: DC

## 2019-08-05 MED FILL — ABIRATERONE ACETATE 250 MG: 250 | 30 days supply | Qty: 120 | Fill #0

## 2019-08-12 ENCOUNTER — Ambulatory Visit: Payer: Medicare Other

## 2019-08-12 ENCOUNTER — Other Ambulatory Visit: Payer: Medicare Other

## 2019-08-18 ENCOUNTER — Other Ambulatory Visit: Payer: Self-pay

## 2019-08-18 NOTE — Telephone Encounter (Signed)
Received call from pt's daughter [name] requesting Fentanyl patch refill.

## 2019-08-19 ENCOUNTER — Ambulatory Visit: Payer: Medicare Other

## 2019-08-19 ENCOUNTER — Other Ambulatory Visit: Payer: Self-pay

## 2019-08-19 ENCOUNTER — Other Ambulatory Visit: Payer: Medicare Other

## 2019-08-19 ENCOUNTER — Other Ambulatory Visit: Payer: Self-pay | Admitting: Hematology

## 2019-08-19 DIAGNOSIS — C7951 Secondary malignant neoplasm of bone: Secondary | ICD-10-CM

## 2019-08-19 DIAGNOSIS — C61 Malignant neoplasm of prostate: Secondary | ICD-10-CM

## 2019-08-19 MED ORDER — FENTANYL 25 MCG/HR TD PT72: 1.0000 | MEDICATED_PATCH | TRANSDERMAL | 0 refills | Status: DC

## 2019-08-19 NOTE — Telephone Encounter (Signed)
Received after hours message regarding call from pt's daughter for Fentanyl patch refill. TCT pt's daughter informing her that this request has been received and physician needs 24-48 hours to send prescription to the pharmacy. She insisted that her dad is completely out and would like the request expedited.   Dr. [name] made aware.

## 2019-08-19 NOTE — Telephone Encounter (Signed)
TCT pt's daughter [name] informing her that Fentanyl patch prescription has been sent to preferred pharmacy for pick up. She verbalized understanding.

## 2019-08-26 ENCOUNTER — Ambulatory Visit: Payer: Medicare Other

## 2019-08-26 ENCOUNTER — Other Ambulatory Visit: Payer: Medicare Other

## 2019-08-27 ENCOUNTER — Inpatient Hospital Stay: Payer: Medicare Other | Attending: Hematology

## 2019-08-27 ENCOUNTER — Inpatient Hospital Stay (HOSPITAL_BASED_OUTPATIENT_CLINIC_OR_DEPARTMENT_OTHER): Payer: Medicare Other | Admitting: Hematology

## 2019-08-27 ENCOUNTER — Inpatient Hospital Stay: Payer: Medicare Other

## 2019-08-27 ENCOUNTER — Other Ambulatory Visit: Payer: Self-pay

## 2019-08-27 VITALS — BP 173/90 | HR 88 | Temp 98.2°F | Resp 18 | Ht 67.0 in | Wt 180.9 lb

## 2019-08-27 DIAGNOSIS — C61 Malignant neoplasm of prostate: Secondary | ICD-10-CM

## 2019-08-27 DIAGNOSIS — C7951 Secondary malignant neoplasm of bone: Secondary | ICD-10-CM

## 2019-08-27 DIAGNOSIS — G893 Neoplasm related pain (acute) (chronic): Secondary | ICD-10-CM

## 2019-08-27 DIAGNOSIS — Z79899 Other long term (current) drug therapy: Secondary | ICD-10-CM | POA: Insufficient documentation

## 2019-08-27 DIAGNOSIS — D649 Anemia, unspecified: Secondary | ICD-10-CM | POA: Insufficient documentation

## 2019-08-27 LAB — CMP (CANCER CENTER ONLY)
ALT: 14 U/L (ref 0–44)
AST: 17 U/L (ref 15–41)
Albumin: 3.4 g/dL — ABNORMAL LOW (ref 3.5–5.0)
Alkaline Phosphatase: 144 U/L — ABNORMAL HIGH (ref 38–126)
Anion gap: 9 (ref 5–15)
BUN: 31 mg/dL — ABNORMAL HIGH (ref 8–23)
CO2: 25 mmol/L (ref 22–32)
Calcium: 7.9 mg/dL — ABNORMAL LOW (ref 8.9–10.3)
Chloride: 103 mmol/L (ref 98–111)
Creatinine: 1.83 mg/dL — ABNORMAL HIGH (ref 0.61–1.24)
GFR, Est AFR Am: 38 mL/min — ABNORMAL LOW (ref >60)
GFR, Estimated: 32 mL/min — ABNORMAL LOW (ref >60)
Glucose, Bld: 98 mg/dL (ref 70–99)
Potassium: 4.2 mmol/L (ref 3.5–5.1)
Sodium: 137 mmol/L (ref 135–145)
Total Bilirubin: 0.9 mg/dL (ref 0.3–1.2)
Total Protein: 6.3 g/dL — ABNORMAL LOW (ref 6.5–8.1)

## 2019-08-27 LAB — CBC WITH DIFFERENTIAL/PLATELET
Abs Immature Granulocytes: 0.08 10*3/uL — ABNORMAL HIGH (ref 0.00–0.07)
Basophils Absolute: 0 10*3/uL (ref 0.0–0.1)
Basophils Relative: 0 %
Eosinophils Absolute: 0 10*3/uL (ref 0.0–0.5)
Eosinophils Relative: 0 %
HCT: 34.3 % — ABNORMAL LOW (ref 39.0–52.0)
Hemoglobin: 10.6 g/dL — ABNORMAL LOW (ref 13.0–17.0)
Immature Granulocytes: 1 %
Lymphocytes Relative: 19 %
Lymphs Abs: 1.2 10*3/uL (ref 0.7–4.0)
MCH: 32.2 pg (ref 26.0–34.0)
MCHC: 30.9 g/dL (ref 30.0–36.0)
MCV: 104.3 fL — ABNORMAL HIGH (ref 80.0–100.0)
Monocytes Absolute: 0.7 10*3/uL (ref 0.1–1.0)
Monocytes Relative: 11 %
Neutro Abs: 4.3 10*3/uL (ref 1.7–7.7)
Neutrophils Relative %: 69 %
Platelets: 156 10*3/uL (ref 150–400)
RBC: 3.29 MIL/uL — ABNORMAL LOW (ref 4.22–5.81)
RDW: 14 % (ref 11.5–15.5)
WBC: 6.3 10*3/uL (ref 4.0–10.5)
nRBC: 0 % (ref 0.0–0.2)

## 2019-08-27 MED ORDER — DENOSUMAB 120 MG/1.7ML ~~LOC~~ SOLN
120.0000 mg | Freq: Once | SUBCUTANEOUS | Status: AC
  Administered 2019-08-27: 15:00:00 120 mg via SUBCUTANEOUS

## 2019-08-27 MED ORDER — DENOSUMAB 120 MG/1.7ML ~~LOC~~ SOLN
SUBCUTANEOUS | Status: AC
  Filled 2019-08-27: qty 1.7

## 2019-08-27 NOTE — Progress Notes (Signed)
HEMATOLOGY/ONCOLOGY CLINIC NOTE  Date of Service: 08/27/19    Patient Care Team: Tony Barrack, MD as PCP - General (Family Medicine)  CHIEF COMPLAINTS/PURPOSE OF CONSULTATION:  -metastatic prostate cancer -Myelopthisic anemia  HISTORY OF PRESENTING ILLNESS:   Tony [name] is a wonderful 86 y.o. male who has been referred to [place] by Dr Tony [name] for evaluation and management of anemia. He is accompanied today by his daughter. The pt reports that he is doing well overall.   The pt reports a new onset of fatigue that began in January 2019. His daughter notes being able to tell a difference in his energy levels as early as November 2018. He notes that some cramping pain in his thighs. He notes that his recent 07/23/17 blood transfusion has led to a "terrific, immediate change" in his thigh pain. However, his thigh pain has returned in the last couple days.  He notes that he takes 1025mg Vitamin B12 daily.   He notes that for 6-7 years he took iron pills after his 2012 heart attack and stroke. He notes that he took a statin for 6-7 months and was taken off of it recently. He notes no unresolved symptoms from his stroke.   The pt notes that over the last 4-5 months his appetite has diminished and he has subsequently lost about 20 lbs in that time.  He notes that he has historically not preferred to drink water as such, but hydrates with juice, coffee, and other drinks.  He notes that he believes that he is able to take care of himself adequately at home. He also notes that his cane is sufficient for helping him get around.   Most recent lab results (07/22/17) of CBC  is as follows: all values are WNL except for RBC at 2.94, Hgb at 8.7, HCT at 27.2, RDW at 26.5, Platelets at 134k. CBC from 10/30/16 revealed all values WNL.  CMP 07/21/17 revealed all values WNL except for Sodium at 134, Glucose at 101, Creatinine at 1.85, Calcium at 7.9, Total Protein at 5.7, Albumin at 2.7, Alk Phos at  433.  LDH 07/20/17 elevated at 256. Haptoglobin 07/20/17 elevated at 357.  Vitamin B12 07/06/17 is WNL at 229.   On review of systems, pt reports decreased appetite, losing 20 lbs over 4-5 months, bilateral thigh pain, fatigue, mild leg swelling, and denies fevers, chills, night sweats, bone pains, nausea, abdominal pains, bleeding, blood in the urine, blood in the stools, black stools, changes in bowel habits, light headednss, dizziness, abdominal pains, noticing any new lumps or bumps, testicular pain or swelling, and any other symptoms.   On PMHx the pt reports heart attack and stroke in 2012. On Social Hx the pt denies much ETOH consumption.   Interval History:  Mr. Tony Mccareyreturns today regarding his metastatic prostate cancer and myelopthisic anemia. The patient's last visit with [place] on 07/01/2019. The pt reports that he is doing well overall.  The pt reports that he has been taking 1000 mg of Zytiga per day. He has had some headaches and leg swelling that is improved with leg elevation. The pain in his knees has improved after injections and exercise of the area. Pt has been eating at his baseline.    Lab results today (08/27/19) of CBC w/diff and CMP is as follows: all values are WNL except for RBC at 3.29, Hgb 10.6, HCT at 34.3, MCV at 104.3, Abs Immature Granulocytes at 0.08K, BUN at 31, Creatinine  at 1.83, Calcium at 7.9, Total Protein at 6.3, Albumin at 3.4, ALP at 144, GFR Est Afr Am at 38.  On review of systems, pt reports headaches, leg swelling, improved knee pain and denies abdominal pain, dysuria, hematuria, bloody/black stools, constipation, diarrhea, low appetite and any other symptoms.   MEDICAL HISTORY:  Past Medical History:  Diagnosis Date  . BPH associated with nocturia    flomax 0.4--> 0.8 mg trial. nocturia if has coffee. some incontinence  . CAD (coronary artery disease)    LAD Stent 2012. 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   . Gout     no rx. apparently 1x in past  . History of stroke    no aspirin before stroke. no deficits. slurred words at time of stroke  . Hyperlipidemia   . Hypertension     SURGICAL HISTORY: Past Surgical History:  Procedure Laterality Date  . CARDIAC SURGERY     Stint  . CORONARY STENT PLACEMENT    . left arm fracture s/p surgery    . right leg fracture s.p surgery- screws like arm      SOCIAL HISTORY: Social History   Socioeconomic History  . Marital status: Married    Spouse name: Not on file  . Number of children: Not on file  . Years of education: Not on file  . Highest education level: Not on file  Occupational History  . Not on file  Tobacco Use  . Smoking status: Former Smoker    Packs/day: 0.50    Years: 1.00    Pack years: 0.50    Types: Cigarettes    Quit date: 05/01/1952    Years since quitting: 67.3  . Smokeless tobacco: Never Used  Substance and Sexual Activity  . Alcohol use: No  . Drug use: No  . Sexual activity: Not on file  Other Topics Concern  . Not on file  Social History Narrative   Married- lives separate from wife. 2 children. Daughter passed from heart attack.       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 Difficulty of Paying Living Expenses:   Food Insecurity:   . Worried About Charity fundraiser in the Last Year:   . Arboriculturist in the Last Year:   Transportation Needs:   . Film/video editor (Medical):   Marland Kitchen Lack of Transportation (Non-Medical):   Physical Activity:   . Days of Exercise per Week:   . Minutes of Exercise per Session:   Stress:   . Feeling of Stress :   Social Connections:   . Frequency of Communication with Friends and Family:   . Frequency of Social Gatherings with Friends and Family:   . Attends Religious Services:   . Active Member of Clubs or Organizations:   . Attends Archivist Meetings:   Marland Kitchen  Marital Status:   Intimate Partner Violence:   . Fear of Current or Ex-Partner:   . Emotionally Abused:   Marland Kitchen Physically Abused:   . Sexually Abused:     FAMILY HISTORY: Family History  Problem Relation Age of Onset  . Breast cancer Mother   . Heart disease Father   . Heart disease Brother        x2  . Prostate cancer Brother     ALLERGIES:  has No Known Allergies.  MEDICATIONS:  Current Outpatient Medications  Medication Sig Dispense Refill  . abiraterone acetate (ZYTIGA) 250 MG tablet Take 4 tablets (1,000 mg total) by mouth daily. Take on an empty stomach 1 hour before or 2 hours after a meal 120 tablet 0  . aspirin EC 81 MG tablet Take 1 tablet (81 mg total) by mouth daily. 90 tablet 3  . BIDIL 20-37.5 MG tablet TAKE 1 TABLET BY MOUTH TWICE DAILY 180 tablet 3  . calcium-vitamin D (OSCAL 500/200 D-3) 500-200 MG-UNIT tablet Take 1 tablet by mouth daily with breakfast. 30 tablet 2  . fentaNYL (DURAGESIC) 25 MCG/HR Place 1 patch onto the skin every 3 (three) days. 10 patch 0  . furosemide (LASIX) 20 MG tablet Take 1 tablet (20 mg total) by mouth daily as needed for fluid or edema. 30 tablet 3  . metoprolol succinate (TOPROL-XL) 25 MG 24 hr tablet Take 1 tablet (25 mg total) by mouth daily. 90 tablet 3  . Multiple Vitamins-Minerals (MULTI COMPLETE PO) Take by mouth.    . nitroGLYCERIN (NITROSTAT) 0.4 MG SL tablet Place 1 tablet (0.4 mg total) under the tongue every 5 (five) minutes as needed for chest pain (3 maximum before seeking care). 30 tablet 0  . Omega-3 1000 MG CAPS Take 1 capsule by mouth daily.     Marland Kitchen oxyCODONE (OXY IR/ROXICODONE) 5 MG immediate release tablet Take 1 tablet (5 mg total) by mouth every 4 (four) hours as needed for severe pain. 90 tablet 0  . polyethylene glycol (MIRALAX) packet Take 17 g by mouth daily. 30 each 1  . predniSONE (DELTASONE) 5 MG tablet Take 1 tablet (5 mg total) by mouth 2 (two) times daily with a meal. 180 tablet 2  . senna-docusate (SENNA S)  8.6-50 MG tablet Take 2 tablets by mouth 2 (two) times daily. 120 tablet 2  . tamsulosin (FLOMAX) 0.4 MG CAPS capsule TAKE 1 CAPSULE BY MOUTH TWICE DAILY 180 capsule 3  . vitamin B-12 (CYANOCOBALAMIN) 1000 MCG tablet Take 1 tablet (1,000 mcg total) by mouth daily. 30 tablet 0  . Vitamin D, Ergocalciferol, (DRISDOL) 1.25 MG (50000 UNIT) CAPS capsule Take 1 capsule Monday, Wednesday and Friday. 36 capsule 3   No current facility-administered medications for this visit.    REVIEW OF SYSTEMS:   A 10+ POINT REVIEW OF SYSTEMS WAS OBTAINED including neurology, dermatology, psychiatry, cardiac, respiratory, lymph, extremities, GI, GU, Musculoskeletal, constitutional, breasts, reproductive, HEENT.  All pertinent positives are noted in the HPI.  All others are negative.   PHYSICAL EXAMINATION: ECOG FS:2 - Symptomatic, <50% confined to bed  Vitals:   08/27/19 1411  BP: (!) 173/90  Pulse: 88  Resp: 18  Temp: 98.2 F (36.8 C)  SpO2: 99%   Wt Readings from Last 3 Encounters:  08/27/19 180 lb 14.4 oz (82.1 kg)  07/16/19 181 lb 6.4 oz (82.3 kg)  07/01/19 176 lb 4.8 oz (80 kg)   Exam was given in wheelchair   GENERAL:alert, in no acute distress and comfortable SKIN: no acute rashes, no significant lesions EYES: conjunctiva are pink and non-injected, sclera anicteric OROPHARYNX: MMM, no exudates, no oropharyngeal erythema or ulceration NECK: supple, no JVD LYMPH:  no palpable lymphadenopathy in the cervical, axillary or inguinal regions LUNGS: clear to auscultation b/l with normal respiratory effort HEART: regular rate & rhythm ABDOMEN:  normoactive bowel sounds , non tender, not distended. No palpable hepatosplenomegaly.  Extremity: trace pedal edema b/l PSYCH: alert & oriented x 3 with fluent speech NEURO:  no focal motor/sensory deficits  LABORATORY DATA:  I have reviewed the data as listed  . CBC Latest Ref Rng & Units 08/27/2019 07/29/2019 07/01/2019  WBC 4.0 - 10.5 K/uL 6.3 5.7 6.0    Hemoglobin 13.0 - 17.0 g/dL 10.6(L) 10.7(L) 10.7(L)  Hematocrit 39.0 - 52.0 % 34.3(L) 34.8(L) 34.4(L)  Platelets 150 - 400 K/uL 156 158 185   . CBC    Component Value Date/Time   WBC 6.3 08/27/2019 1342   RBC 3.29 (L) 08/27/2019 1342   HGB 10.6 (L) 08/27/2019 1342   HGB 9.7 (L) 05/31/2018 1050   HCT 34.3 (L) 08/27/2019 1342   HCT 21.9 (L) 07/20/2017 2103   PLT 156 08/27/2019 1342   PLT 231 05/31/2018 1050   MCV 104.3 (H) 08/27/2019 1342   MCH 32.2 08/27/2019 1342   MCHC 30.9 08/27/2019 1342   RDW 14.0 08/27/2019 1342   LYMPHSABS 1.2 08/27/2019 1342   MONOABS 0.7 08/27/2019 1342   EOSABS 0.0 08/27/2019 1342   BASOSABS 0.0 08/27/2019 1342     . CMP Latest Ref Rng & Units 08/27/2019 07/29/2019 07/01/2019  Glucose 70 - 99 mg/dL 98 129(H) 96  BUN 8 - 23 mg/dL 31(H) 33(H) 30(H)  Creatinine 0.61 - 1.24 mg/dL 1.83(H) 1.88(H) 1.98(H)  Sodium 135 - 145 mmol/L 137 136 138  Potassium 3.5 - 5.1 mmol/L 4.2 4.6 4.7  Chloride 98 - 111 mmol/L 103 104 102  CO2 22 - 32 mmol/L '25 25 26  '$ Calcium 8.9 - 10.3 mg/dL 7.9(L) 8.2(L) 8.4(L)  Total Protein 6.5 - 8.1 g/dL 6.3(L) 6.3(L) 7.2  Total Bilirubin 0.3 - 1.2 mg/dL 0.9 0.9 1.1  Alkaline Phos 38 - 126 U/L 144(H) 171(H) 155(H)  AST 15 - 41 U/L '17 16 20  '$ ALT 0 - 44 U/L '14 12 25    '$ Component     Latest Ref Rng & Units 07/30/2017 09/06/2017 10/04/2017 10/31/2017  Prostate Specific Ag, Serum     0.0 - 4.0 ng/mL 1,186.0 (H) 433.8 (H) 67.4 (H) 55.8 (H)   07/31/17 BM Bx:   RADIOGRAPHIC STUDIES: I have personally reviewed the radiological images as listed and agreed with the findings in the report. No results found.  ASSESSMENT & PLAN:   86 y.o. male with  1. Normocytic Anemia - due to primarily metastatic prostatic cancer (ACD + BM involvement with prostate cancer causing myelopthisic picture)  -Pt presented with a Hgb at 8.7 on 07/22/17, improved to 10.3 after receiving PRBC transfusion. -In review of the patient's previous CBC records, his anemia  developed 6-7 months before presenting to care with me, unaccompanied by a drop in his EPO (07/20/17 elevated at 249.8);  LDH elevated  but haptoglobin at 357 on 07/20/17. -suggested against active hemolysis.  2. Recently diagnosed metastatic prostate cancer with extensive bone metastases. With bone mets/BM Mets and LNadenopathy. Exam positive for left retroperitoneal, bilateral pelvis and right inguinal adenopathy. Exam positive for left retroperitoneal, bilateral pelvis and right inguinal adenopathy. Diffuse sclerotic bone metastasis.   3. Elevated alkaline phosphatase due to bone metastases from prostate cancer -CT BM bx -concerning for significant bone lesions in lower spine and pelvis PSA levels have declined to 26.9  4. Cancer related pain - primarily in b/l thighs. -much improved.  PLAN:  -Discussed pt labwork today, 08/27/19; anemia is stable, WBC and PLT are nml, blood chemistries are stable -Discussed 07/29/2019 Prostate-Specific-AG at 181.0 -Discussed 07/01/2019 Vitamin D 25 hydroxy at 121.18 -Elevated ALP may be due to Zytiga - not prohibitive  at this time -The pt has no prohibitive toxicities from continuing 1000 mg Zytiga at this time. -Tumor markers continue to elevate slowly, but there are no signs of clinically symptomatic progression -Will continue Zytiga until clinically significant progression or intolerance. No indication to change treatment at this time -Advised pt that we will likely need to progress to more aggressive treatments overtime, but would not want to subject him to these treatments unless necessary  -Continue Xgeva q4weeks  -Continue Lupron q12weeks  -Will get repeat PET/CT in 6 weeks  -Will see back in 8 weeks with labs    FOLLOW UP: F/u for Xgeva q4weeks --plz schedule next 6 appointments Continue Lupron q12 weeks-- plz schedule next 4 appointments Labs every 4 weeks with each treatment PET/CT in 6 weeks RTC with Dr [name] with labs in 8  weeks   The total time spent in the appt was 30 minutes and more than 50% was on counseling and direct patient cares.  All of the patient's questions were answered with apparent satisfaction. The patient knows to call the clinic with any problems, questions or concerns.    Sullivan Lone MD [place] AAHIVMS [facility] Hematology/Oncology Physician [facility]  (Office):       [phone] (Work cell):  [phone] (Fax):           [phone]  08/27/2019 4:04 PM  I, Yevette Edwards, am acting as a scribe for Dr. Sullivan Lone.   .I have reviewed the above documentation for accuracy and completeness, and I agree with the above. Brunetta Genera MD

## 2019-08-27 NOTE — Progress Notes (Signed)
Per MD ok to give Xgeva with Ca of 7.9.

## 2019-08-27 NOTE — Progress Notes (Signed)
Verbal Order from Dr. [name] - Madaline Brilliant to receive xgeva today with calcium 7.9

## 2019-08-27 NOTE — Patient Instructions (Signed)
Denosumab injection What is this medicine? DENOSUMAB (den oh sue mab) slows bone breakdown. Prolia is used to treat osteoporosis in women after menopause and in men, and in people who are taking corticosteroids for 6 months or more. Delton See is used to treat a high calcium level due to cancer and to prevent bone fractures and other bone problems caused by multiple myeloma or cancer bone metastases. Delton See is also used to treat giant cell tumor of the bone. This medicine may be used for other purposes; ask your health care provider or pharmacist if you have questions. COMMON BRAND NAME(S): Prolia, XGEVA What should I tell my health care provider before I take this medicine? They need to know if you have any of these conditions:  dental disease  having surgery or tooth extraction  infection  kidney disease  low levels of calcium or Vitamin D in the blood  malnutrition  on hemodialysis  skin conditions or sensitivity  thyroid or parathyroid disease  an unusual reaction to denosumab, other medicines, foods, dyes, or preservatives  pregnant or trying to get pregnant  breast-feeding How should I use this medicine? This medicine is for injection under the skin. It is given by a health care professional in a hospital or clinic setting. A special MedGuide will be given to you before each treatment. Be sure to read this information carefully each time. For Prolia, talk to your pediatrician regarding the use of this medicine in children. Special care may be needed. For Delton See, talk to your pediatrician regarding the use of this medicine in children. While this drug may be prescribed for children as young as 13 years for selected conditions, precautions do apply. Overdosage: If you think you have taken too much of this medicine contact a poison control center or emergency room at once. NOTE: This medicine is only for you. Do not share this medicine with others. What if I miss a dose? It is  important not to miss your dose. Call your doctor or health care professional if you are unable to keep an appointment. What may interact with this medicine? Do not take this medicine with any of the following medications:  other medicines containing denosumab This medicine may also interact with the following medications:  medicines that lower your chance of fighting infection  steroid medicines like prednisone or cortisone This list may not describe all possible interactions. Give your health care provider a list of all the medicines, herbs, non-prescription drugs, or dietary supplements you use. Also tell them if you smoke, drink alcohol, or use illegal drugs. Some items may interact with your medicine. What should I watch for while using this medicine? Visit your doctor or health care professional for regular checks on your progress. Your doctor or health care professional may order blood tests and other tests to see how you are doing. Call your doctor or health care professional for advice if you get a fever, chills or sore throat, or other symptoms of a cold or flu. Do not treat yourself. This drug may decrease your body's ability to fight infection. Try to avoid being around people who are sick. You should make sure you get enough calcium and vitamin D while you are taking this medicine, unless your doctor tells you not to. Discuss the foods you eat and the vitamins you take with your health care professional. See your dentist regularly. Brush and floss your teeth as directed. Before you have any dental work done, tell your dentist you are  receiving this medicine. Do not become pregnant while taking this medicine or for 5 months after stopping it. Talk with your doctor or health care professional about your birth control options while taking this medicine. Women should inform their doctor if they wish to become pregnant or think they might be pregnant. There is a potential for serious side  effects to an unborn child. Talk to your health care professional or pharmacist for more information. What side effects may I notice from receiving this medicine? Side effects that you should report to your doctor or health care professional as soon as possible:  allergic reactions like skin rash, itching or hives, swelling of the face, lips, or tongue  bone pain  breathing problems  dizziness  jaw pain, especially after dental work  redness, blistering, peeling of the skin  signs and symptoms of infection like fever or chills; cough; sore throat; pain or trouble passing urine  signs of low calcium like fast heartbeat, muscle cramps or muscle pain; pain, tingling, numbness in the hands or feet; seizures  unusual bleeding or bruising  unusually weak or tired Side effects that usually do not require medical attention (report to your doctor or health care professional if they continue or are bothersome):  constipation  diarrhea  headache  joint pain  loss of appetite  muscle pain  runny nose  tiredness  upset stomach This list may not describe all possible side effects. Call your doctor for medical advice about side effects. You may report side effects to FDA at 1-800-FDA-1088. Where should I keep my medicine? This medicine is only given in a clinic, doctor's office, or other health care setting and will not be stored at home. NOTE: This sheet is a summary. It may not cover all possible information. If you have questions about this medicine, talk to your doctor, pharmacist, or health care provider.  2020 Elsevier/Gold Standard (2017-08-24 16:10:44)

## 2019-08-28 ENCOUNTER — Other Ambulatory Visit: Payer: Self-pay | Admitting: Hematology

## 2019-08-28 DIAGNOSIS — C61 Malignant neoplasm of prostate: Secondary | ICD-10-CM

## 2019-08-29 ENCOUNTER — Telehealth: Payer: Self-pay | Admitting: Hematology

## 2019-08-29 NOTE — Telephone Encounter (Signed)
Scheduled per los, called patient's daughter and patient will be notified of upcoming appointments.

## 2019-09-01 MED FILL — ABIRATERONE ACETATE 250 MG: 250 | 30 days supply | Qty: 120 | Fill #0

## 2019-09-03 ENCOUNTER — Other Ambulatory Visit: Payer: Self-pay | Admitting: Hematology

## 2019-09-03 DIAGNOSIS — C61 Malignant neoplasm of prostate: Secondary | ICD-10-CM

## 2019-09-03 DIAGNOSIS — C7951 Secondary malignant neoplasm of bone: Secondary | ICD-10-CM

## 2019-09-09 ENCOUNTER — Other Ambulatory Visit: Payer: Medicare Other

## 2019-09-09 ENCOUNTER — Ambulatory Visit: Payer: Medicare Other

## 2019-09-16 ENCOUNTER — Ambulatory Visit: Payer: Medicare Other

## 2019-09-16 ENCOUNTER — Other Ambulatory Visit: Payer: Medicare Other

## 2019-09-18 ENCOUNTER — Other Ambulatory Visit: Payer: Self-pay | Admitting: *Deleted

## 2019-09-18 DIAGNOSIS — C61 Malignant neoplasm of prostate: Secondary | ICD-10-CM

## 2019-09-18 MED ORDER — FENTANYL 25 MCG/HR TD PT72: 1.0000 | MEDICATED_PATCH | TRANSDERMAL | 0 refills | Status: DC

## 2019-09-18 NOTE — Telephone Encounter (Signed)
Daughter called and requested refill of Fentanyl patch for patient

## 2019-09-22 ENCOUNTER — Telehealth: Payer: Self-pay | Admitting: Emergency Medicine

## 2019-09-22 NOTE — Telephone Encounter (Signed)
Received call from pt's daughter reporting red tender rash that has increased over last few days in size.  Denies CP/SOB or fever/chills.  Pt scheduled to come for lab/injection tomorrow (09/23/19), MD Kale's schedule full.  Pt will be seen by [facility] between his lab/injection appts for rash.  Daughter [name] aware of all appt times tomorrow and to f/u before then with any questions or concerns or worsening symptoms.

## 2019-09-23 ENCOUNTER — Other Ambulatory Visit: Payer: Medicare Other

## 2019-09-23 ENCOUNTER — Other Ambulatory Visit: Payer: Self-pay

## 2019-09-23 ENCOUNTER — Inpatient Hospital Stay (HOSPITAL_BASED_OUTPATIENT_CLINIC_OR_DEPARTMENT_OTHER): Payer: Medicare Other | Admitting: Medical

## 2019-09-23 ENCOUNTER — Ambulatory Visit: Payer: Medicare Other

## 2019-09-23 ENCOUNTER — Inpatient Hospital Stay: Payer: Medicare Other

## 2019-09-23 ENCOUNTER — Inpatient Hospital Stay: Payer: Medicare Other | Attending: Hematology

## 2019-09-23 VITALS — BP 179/82 | HR 97 | Temp 98.4°F | Resp 17 | Ht 67.0 in | Wt 181.2 lb

## 2019-09-23 DIAGNOSIS — C7951 Secondary malignant neoplasm of bone: Secondary | ICD-10-CM | POA: Insufficient documentation

## 2019-09-23 DIAGNOSIS — G893 Neoplasm related pain (acute) (chronic): Secondary | ICD-10-CM | POA: Diagnosis not present

## 2019-09-23 DIAGNOSIS — C61 Malignant neoplasm of prostate: Secondary | ICD-10-CM | POA: Insufficient documentation

## 2019-09-23 DIAGNOSIS — B029 Zoster without complications: Secondary | ICD-10-CM | POA: Insufficient documentation

## 2019-09-23 DIAGNOSIS — Z79899 Other long term (current) drug therapy: Secondary | ICD-10-CM | POA: Diagnosis not present

## 2019-09-23 LAB — CMP (CANCER CENTER ONLY)
ALT: 14 U/L (ref 0–44)
AST: 17 U/L (ref 15–41)
Albumin: 3.2 g/dL — ABNORMAL LOW (ref 3.5–5.0)
Alkaline Phosphatase: 174 U/L — ABNORMAL HIGH (ref 38–126)
Anion gap: 6 (ref 5–15)
BUN: 29 mg/dL — ABNORMAL HIGH (ref 8–23)
CO2: 29 mmol/L (ref 22–32)
Calcium: 8.5 mg/dL — ABNORMAL LOW (ref 8.9–10.3)
Chloride: 103 mmol/L (ref 98–111)
Creatinine: 2.12 mg/dL — ABNORMAL HIGH (ref 0.61–1.24)
GFR, Est AFR Am: 31 mL/min — ABNORMAL LOW (ref >60)
GFR, Estimated: 27 mL/min — ABNORMAL LOW (ref >60)
Glucose, Bld: 100 mg/dL — ABNORMAL HIGH (ref 70–99)
Potassium: 4.3 mmol/L (ref 3.5–5.1)
Sodium: 138 mmol/L (ref 135–145)
Total Bilirubin: 0.7 mg/dL (ref 0.3–1.2)
Total Protein: 6.4 g/dL — ABNORMAL LOW (ref 6.5–8.1)

## 2019-09-23 LAB — CBC WITH DIFFERENTIAL/PLATELET
Abs Immature Granulocytes: 0.18 10*3/uL — ABNORMAL HIGH (ref 0.00–0.07)
Basophils Absolute: 0 10*3/uL (ref 0.0–0.1)
Basophils Relative: 1 %
Eosinophils Absolute: 0 10*3/uL (ref 0.0–0.5)
Eosinophils Relative: 0 %
HCT: 33.2 % — ABNORMAL LOW (ref 39.0–52.0)
Hemoglobin: 10.2 g/dL — ABNORMAL LOW (ref 13.0–17.0)
Immature Granulocytes: 3 %
Lymphocytes Relative: 26 %
Lymphs Abs: 1.7 10*3/uL (ref 0.7–4.0)
MCH: 31.9 pg (ref 26.0–34.0)
MCHC: 30.7 g/dL (ref 30.0–36.0)
MCV: 103.8 fL — ABNORMAL HIGH (ref 80.0–100.0)
Monocytes Absolute: 0.9 10*3/uL (ref 0.1–1.0)
Monocytes Relative: 13 %
Neutro Abs: 3.7 10*3/uL (ref 1.7–7.7)
Neutrophils Relative %: 57 %
Platelets: 161 10*3/uL (ref 150–400)
RBC: 3.2 MIL/uL — ABNORMAL LOW (ref 4.22–5.81)
RDW: 14.1 % (ref 11.5–15.5)
WBC: 6.6 10*3/uL (ref 4.0–10.5)
nRBC: 0.6 % — ABNORMAL HIGH (ref 0.0–0.2)

## 2019-09-23 MED ORDER — GABAPENTIN 100 MG PO CAPS: 200.0000 mg | ORAL_CAPSULE | Freq: Every day | ORAL | 0 refills | Status: DC

## 2019-09-23 MED ORDER — VALACYCLOVIR HCL 500 MG PO TABS: 500.0000 mg | ORAL_TABLET | Freq: Three times a day (TID) | ORAL | 0 refills | Status: DC

## 2019-09-23 MED ORDER — DENOSUMAB 120 MG/1.7ML ~~LOC~~ SOLN
SUBCUTANEOUS | Status: AC
  Filled 2019-09-23: qty 1.7

## 2019-09-23 MED ORDER — DENOSUMAB 120 MG/1.7ML ~~LOC~~ SOLN
120.0000 mg | Freq: Once | SUBCUTANEOUS | Status: AC
  Administered 2019-09-23: 120 mg via SUBCUTANEOUS

## 2019-09-23 MED ORDER — PREDNISONE 5 MG PO TABS: ORAL_TABLET | ORAL | 0 refills | Status: DC

## 2019-09-23 NOTE — Patient Instructions (Signed)
Denosumab injection What is this medicine? DENOSUMAB (den oh sue mab) slows bone breakdown. Prolia is used to treat osteoporosis in women after menopause and in men, and in people who are taking corticosteroids for 6 months or more. Xgeva is used to treat a high calcium level due to cancer and to prevent bone fractures and other bone problems caused by multiple myeloma or cancer bone metastases. Xgeva is also used to treat giant cell tumor of the bone. This medicine may be used for other purposes; ask your health care provider or pharmacist if you have questions. COMMON BRAND NAME(S): Prolia, XGEVA What should I tell my health care provider before I take this medicine? They need to know if you have any of these conditions:  dental disease  having surgery or tooth extraction  infection  kidney disease  low levels of calcium or Vitamin D in the blood  malnutrition  on hemodialysis  skin conditions or sensitivity  thyroid or parathyroid disease  an unusual reaction to denosumab, other medicines, foods, dyes, or preservatives  pregnant or trying to get pregnant  breast-feeding How should I use this medicine? This medicine is for injection under the skin. It is given by a health care professional in a hospital or clinic setting. A special MedGuide will be given to you before each treatment. Be sure to read this information carefully each time. For Prolia, talk to your pediatrician regarding the use of this medicine in children. Special care may be needed. For Xgeva, talk to your pediatrician regarding the use of this medicine in children. While this drug may be prescribed for children as young as 13 years for selected conditions, precautions do apply. Overdosage: If you think you have taken too much of this medicine contact a poison control center or emergency room at once. NOTE: This medicine is only for you. Do not share this medicine with others. What if I miss a dose? It is  important not to miss your dose. Call your doctor or health care professional if you are unable to keep an appointment. What may interact with this medicine? Do not take this medicine with any of the following medications:  other medicines containing denosumab This medicine may also interact with the following medications:  medicines that lower your chance of fighting infection  steroid medicines like prednisone or cortisone This list may not describe all possible interactions. Give your health care provider a list of all the medicines, herbs, non-prescription drugs, or dietary supplements you use. Also tell them if you smoke, drink alcohol, or use illegal drugs. Some items may interact with your medicine. What should I watch for while using this medicine? Visit your doctor or health care professional for regular checks on your progress. Your doctor or health care professional may order blood tests and other tests to see how you are doing. Call your doctor or health care professional for advice if you get a fever, chills or sore throat, or other symptoms of a cold or flu. Do not treat yourself. This drug may decrease your body's ability to fight infection. Try to avoid being around people who are sick. You should make sure you get enough calcium and vitamin D while you are taking this medicine, unless your doctor tells you not to. Discuss the foods you eat and the vitamins you take with your health care professional. See your dentist regularly. Brush and floss your teeth as directed. Before you have any dental work done, tell your dentist you are   receiving this medicine. Do not become pregnant while taking this medicine or for 5 months after stopping it. Talk with your doctor or health care professional about your birth control options while taking this medicine. Women should inform their doctor if they wish to become pregnant or think they might be pregnant. There is a potential for serious side  effects to an unborn child. Talk to your health care professional or pharmacist for more information. What side effects may I notice from receiving this medicine? Side effects that you should report to your doctor or health care professional as soon as possible:  allergic reactions like skin rash, itching or hives, swelling of the face, lips, or tongue  bone pain  breathing problems  dizziness  jaw pain, especially after dental work  redness, blistering, peeling of the skin  signs and symptoms of infection like fever or chills; cough; sore throat; pain or trouble passing urine  signs of low calcium like fast heartbeat, muscle cramps or muscle pain; pain, tingling, numbness in the hands or feet; seizures  unusual bleeding or bruising  unusually weak or tired Side effects that usually do not require medical attention (report to your doctor or health care professional if they continue or are bothersome):  constipation  diarrhea  headache  joint pain  loss of appetite  muscle pain  runny nose  tiredness  upset stomach This list may not describe all possible side effects. Call your doctor for medical advice about side effects. You may report side effects to FDA at 1-800-FDA-1088. Where should I keep my medicine? This medicine is only given in a clinic, doctor's office, or other health care setting and will not be stored at home. NOTE: This sheet is a summary. It may not cover all possible information. If you have questions about this medicine, talk to your doctor, pharmacist, or health care provider.  2020 Elsevier/Gold Standard (2017-08-24 16:10:44)

## 2019-09-23 NOTE — Patient Instructions (Signed)
Shingles  Shingles, which is also known as herpes zoster, is an infection that causes a painful skin rash and fluid-filled blisters. It is caused by a virus. Shingles only develops in people who:  Have had chickenpox.  Have been given a medicine to protect against chickenpox (have been vaccinated). Shingles is rare in this group. What are the causes? Shingles is caused by varicella-zoster virus (VZV). This is the same virus that causes chickenpox. After a person is exposed to VZV, the virus stays in the body in an inactive (dormant) state. Shingles develops if the virus is reactivated. This can happen many years after the first (initial) exposure to VZV. It is not known what causes this virus to be reactivated. What increases the risk? People who have had chickenpox or received the chickenpox vaccine are at risk for shingles. Shingles infection is more common in people who:  Are older than age 60.  Have a weakened disease-fighting system (immune system), such as people with: ? HIV. ? AIDS. ? Cancer.  Are taking medicines that weaken the immune system, such as transplant medicines.  Are experiencing a lot of stress. What are the signs or symptoms? Early symptoms of this condition include itching, tingling, and pain in an area on your skin. Pain may be described as burning, stabbing, or throbbing. A few days or weeks after early symptoms start, a painful red rash appears. The rash is usually on one side of the body and has a band-like or belt-like pattern. The rash eventually turns into fluid-filled blisters that break open, change into scabs, and dry up in about 2-3 weeks. At any time during the infection, you may also develop:  A fever.  Chills.  A headache.  An upset stomach. How is this diagnosed? This condition is diagnosed with a skin exam. Skin or fluid samples may be taken from the blisters before a diagnosis is made. These samples are examined under a microscope or sent to  a lab for testing. How is this treated? The rash may last for several weeks. There is not a specific cure for this condition. Your health care provider will probably prescribe medicines to help you manage pain, recover more quickly, and avoid long-term problems. Medicines may include:  Antiviral drugs.  Anti-inflammatory drugs.  Pain medicines.  Anti-itching medicines (antihistamines). If the area involved is on your face, you may be referred to a specialist, such as an eye doctor (ophthalmologist) or an ear, nose, and throat (ENT) doctor (otolaryngologist) to help you avoid eye problems, chronic pain, or disability. Follow these instructions at home: Medicines  Take over-the-counter and prescription medicines only as told by your health care provider.  Apply an anti-itch cream or numbing cream to the affected area as told by your health care provider. Relieving itching and discomfort   Apply cold, wet cloths (cold compresses) to the area of the rash or blisters as told by your health care provider.  Cool baths can be soothing. Try adding baking soda or dry oatmeal to the water to reduce itching. Do not bathe in hot water. Blister and rash care  Keep your rash covered with a loose bandage (dressing). Wear loose-fitting clothing to help ease the pain of material rubbing against the rash.  Keep your rash and blisters clean by washing the area with mild soap and cool water as told by your health care provider.  Check your rash every day for signs of infection. Check for: ? More redness, swelling, or pain. ? Fluid   or blood. ? Warmth. ? Pus or a bad smell.  Do not scratch your rash or pick at your blisters. To help avoid scratching: ? Keep your fingernails clean and cut short. ? Wear gloves or mittens while you sleep, if scratching is a problem. General instructions  Rest as told by your health care provider.  Keep all follow-up visits as told by your health care provider. This  is important.  Wash your hands often with soap and water. If soap and water are not available, use hand sanitizer. Doing this lowers your chance of getting a bacterial skin infection.  Before your blisters change into scabs, your shingles infection can cause chickenpox in people who have never had it or have never been vaccinated against it. To prevent this from happening, avoid contact with other people, especially: ? Babies. ? Pregnant women. ? Children who have eczema. ? Elderly people who have transplants. ? People who have chronic illnesses, such as cancer or AIDS. Contact a health care provider if:  Your pain is not relieved with prescribed medicines.  Your pain does not get better after the rash heals.  You have signs of infection in the rash area, such as: ? More redness, swelling, or pain around the rash. ? Fluid or blood coming from the rash. ? The rash area feeling warm to the touch. ? Pus or a bad smell coming from the rash. Get help right away if:  The rash is on your face or nose.  You have facial pain, pain around your eye area, or loss of feeling on one side of your face.  You have difficulty seeing.  You have ear pain or have ringing in your ear.  You have a loss of taste.  Your condition gets worse. Summary  Shingles, which is also known as herpes zoster, is an infection that causes a painful skin rash and fluid-filled blisters.  This condition is diagnosed with a skin exam. Skin or fluid samples may be taken from the blisters and examined before the diagnosis is made.  Keep your rash covered with a loose bandage (dressing). Wear loose-fitting clothing to help ease the pain of material rubbing against the rash.  Before your blisters change into scabs, your shingles infection can cause chickenpox in people who have never had it or have never been vaccinated against it. This information is not intended to replace advice given to you by your health care  provider. Make sure you discuss any questions you have with your health care provider. Document Revised: 08/09/2018 Document Reviewed: 12/20/2016 Elsevier Patient Education  2020 [employer]  

## 2019-09-23 NOTE — Progress Notes (Signed)
Pt seen by PA Lucianne Lei only, no assessment by [facility] RN at this time (time constraints).  PA Lucianne Lei aware.  Injection scheduled for today given during [facility] visit.

## 2019-09-24 ENCOUNTER — Other Ambulatory Visit: Payer: Self-pay | Admitting: Hematology

## 2019-09-24 DIAGNOSIS — C61 Malignant neoplasm of prostate: Secondary | ICD-10-CM

## 2019-09-25 MED FILL — ABIRATERONE ACETATE 250 MG: 250 | 30 days supply | Qty: 120 | Fill #0

## 2019-09-25 NOTE — Progress Notes (Signed)
[facility] Progress Note   Tony [name] [id number] [birth year] 86 y.o.  Tony [name] is managed by Dr. Sullivan Lone  Actively treated with chemotherapy/immunotherapy/hormonal therapy: yes  Current therapy: Delton See and Zytiga  Next scheduled appointment with provider: 10/24/2019  Assessment: Plan:    Herpes zoster without complication - Plan: predniSONE (DELTASONE) 5 MG tablet, gabapentin (NEURONTIN) 100 MG capsule, valACYclovir (VALTREX) 500 MG tablet  Malignant neoplasm of prostate ([name])   Herpes zoster: Tony [name] was placed on Valtrex 500 mg p.o. 3 times daily, gabapentin 200 mg p.o. nightly x21 days, and a 6-day prednisone taper.  Metastatic prostate cancer with bone metastasis: Tony [name] continues to be managed by Dr. Sullivan Lone and is treated with [name] and Delton See.  He will receive Xgeva today.  He is scheduled to be seen next on 10/24/2039.  Please see After Visit Summary for patient specific instructions.  Future Appointments  Date Time Provider [place]  10/24/2019 11:30 AM [facility] None  10/24/2019 12:00 PM Brunetta Genera, MD [facility] None  10/24/2019 12:45 PM [facility] [place] [facility] None  11/18/2019  1:30 PM [facility] 3 [facility] None  11/18/2019  2:00 PM [facility] [place] FL[facility] None  12/16/2019  1:30 PM [facility] 6 [facility] None  12/16/2019  2:00 PM [facility] [place] FLUSH [facility] None  01/13/2020  1:30 PM [facility] 2 [facility] None  01/13/2020  2:00 PM [facility] [place] FLUSH [facility] None  01/16/2020  1:20 PM [name], MD [facility]  02/10/2020  1:30 PM [facility] 3 [facility] None  02/10/2020  2:00 PM [facility] [place] FL[facility] None    No orders of the defined types were placed in this encounter.      Subjective:   Patient ID:  Tony [name] is a 86 y.o. (DOB 26-Dec-1930) male.  Chief Complaint:  Chief Complaint  Patient presents with  . Rash     HPI Tony [name] is a 86 y.o. male with a diagnosis of a metastatic prostate cancer with bony metastasis.  He has managed by Dr. [name].  He presents to the clinic today for Xgeva.  Additionally he presents having had a rash that developed on his left scapula on Sunday and now extends medially along his upper arm and forearm, left palm and thumb and index finger.  He has never had a shingles vaccine.  He reports that the rash is tender.  He denies fevers, chills, or sweats.  Medications: I have reviewed the patient's current medications.  Allergies: No Known Allergies  Past Medical History:  Diagnosis Date  . BPH associated with nocturia    flomax 0.4--> 0.8 mg trial. nocturia if has coffee. some incontinence  . CAD (coronary artery disease)    LAD Stent 2012. 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   . Gout    no rx. apparently 1x in past  . History of stroke    no aspirin before stroke. no deficits. slurred words at time of stroke  . Hyperlipidemia   . Hypertension     Past Surgical History:  Procedure Laterality Date  . CARDIAC SURGERY     Stint  . CORONARY STENT PLACEMENT    . left arm fracture s/p surgery    . right leg fracture s.p surgery- screws like arm      Family History  Problem Relation Age of Onset  . Breast cancer Mother   . Heart  disease Father   . Heart disease Brother        x2  . Prostate cancer Brother     Social History   Socioeconomic History  . Marital status: Married    Spouse name: Not on file  . Number of children: Not on file  . Years of education: Not on file  . Highest education level: Not on file  Occupational History  . Not on file  Tobacco Use  . Smoking status: Former Smoker    Packs/day: 0.50    Years: 1.00    Pack years: 0.50    Types: Cigarettes    Quit date: 05/01/1952    Years since quitting: 67.4  . Smokeless tobacco: Never Used  Substance and Sexual Activity  . Alcohol use: No  . Drug use: No   . Sexual activity: Not on file  Other Topics Concern  . Not on file  Social History Narrative   Married- lives separate from wife. 2 children. Daughter passed from heart attack.       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 Difficulty of Paying Living Expenses:   Food Insecurity:   . Worried About Charity fundraiser in the Last Year:   . Arboriculturist in the Last Year:   Transportation Needs:   . Film/video editor (Medical):   Marland Kitchen Lack of Transportation (Non-Medical):   Physical Activity:   . Days of Exercise per Week:   . Minutes of Exercise per Session:   Stress:   . Feeling of Stress :   Social Connections:   . Frequency of Communication with Friends and Family:   . Frequency of Social Gatherings with Friends and Family:   . Attends Religious Services:   . Active Member of Clubs or Organizations:   . Attends Archivist Meetings:   Marland Kitchen Marital Status:   Intimate Partner Violence:   . Fear of Current or Ex-Partner:   . Emotionally Abused:   Marland Kitchen Physically Abused:   . Sexually Abused:     Past Medical History, Surgical history, Social history, and Family history were reviewed and updated as appropriate.   Please see review of systems for further details on the patient's review from today.   Review of Systems:  Review of Systems  Constitutional: Negative for chills, diaphoresis and fever.  HENT: Negative for facial swelling and trouble swallowing.   Respiratory: Negative for cough, chest tightness and shortness of breath.   Cardiovascular: Negative for chest pain.  Skin: Positive for rash.    Objective:   Physical Exam:  BP (!) 179/82 (BP Location: Left Arm, Patient Position: Sitting)   Pulse 97   Temp 98.4 F (36.9 C)   Resp 17   Ht 5\' 7"  (1.702 m)   Wt 82.2 kg (181 lb 3.2 oz)   SpO2 99%   BMI 28.38 kg/m  ECOG: 1  Physical  Exam Constitutional:      General: He is not in acute distress.    Appearance: Normal appearance. He is not ill-appearing.  Eyes:     General: No scleral icterus.       Right eye: No discharge.        Left eye: No discharge.     Conjunctiva/sclera: Conjunctivae normal.  Skin:    Findings: Erythema and rash present. Rash is vesicular.  Neurological:     Mental Status: He is alert.     Lab Review:     Component Value Date/Time   NA 138 09/23/2019 1327   K 4.3 09/23/2019 1327   CL 103 09/23/2019 1327   CO2 29 09/23/2019 1327   GLUCOSE 100 (H) 09/23/2019 1327   BUN 29 (H) 09/23/2019 1327   CREATININE 2.12 (H) 09/23/2019 1327   CALCIUM 8.5 (L) 09/23/2019 1327   PROT 6.4 (L) 09/23/2019 1327   ALBUMIN 3.2 (L) 09/23/2019 1327   AST 17 09/23/2019 1327   ALT 14 09/23/2019 1327   ALKPHOS 174 (H) 09/23/2019 1327   BILITOT 0.7 09/23/2019 1327   GFRNONAA 27 (L) 09/23/2019 1327   GFRAA 31 (L) 09/23/2019 1327       Component Value Date/Time   WBC 6.6 09/23/2019 1327   RBC 3.20 (L) 09/23/2019 1327   HGB 10.2 (L) 09/23/2019 1327   HGB 9.7 (L) 05/31/2018 1050   HCT 33.2 (L) 09/23/2019 1327   HCT 21.9 (L) 07/20/2017 2103   PLT 161 09/23/2019 1327   PLT 231 05/31/2018 1050   MCV 103.8 (H) 09/23/2019 1327   MCH 31.9 09/23/2019 1327   MCHC 30.7 09/23/2019 1327   RDW 14.1 09/23/2019 1327   LYMPHSABS 1.7 09/23/2019 1327   MONOABS 0.9 09/23/2019 1327   EOSABS 0.0 09/23/2019 1327   BASOSABS 0.0 09/23/2019 1327   -------------------------------  Imaging from last 24 hours (if applicable):  Radiology interpretation: No results found.      This case was discussed with Dr. [name]. He expresses agreement with my management of this patient.

## 2019-09-29 IMAGING — NM NM BONE WHOLE BODY
2 series · 2 of 2 positions shown · non-contrast
Comparison: CT 08/06/2016.

CLINICAL DATA: Prostate cancer.

EXAM:
NUCLEAR MEDICINE WHOLE BODY BONE SCAN
TECHNIQUE: Whole body anterior and posterior images were obtained approximately
3 hours after intravenous injection of radiopharmaceutical.
RADIOPHARMACEUTICALS:  21.6 mCi Yechnetium-GGm MDP IV

[Series 1: wbr_bone_60 whole body · 2.66mm/px · 1 of 1 slices shown (1 of 2)]
[im 1/1]
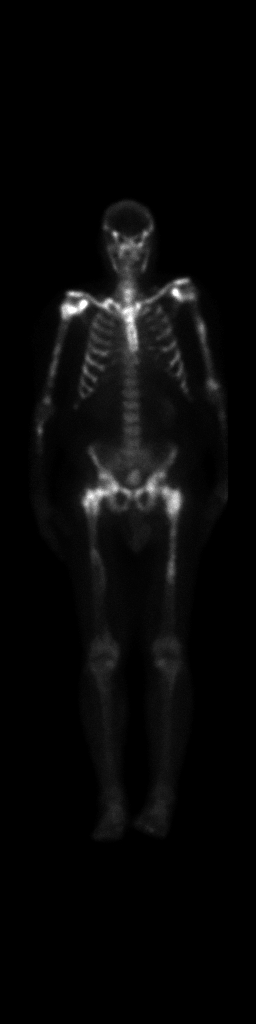

[Series 1: wbr_bone_60 whole body · 2.66mm/px · 1 of 1 slices shown (2 of 2)]
[im 1/1]
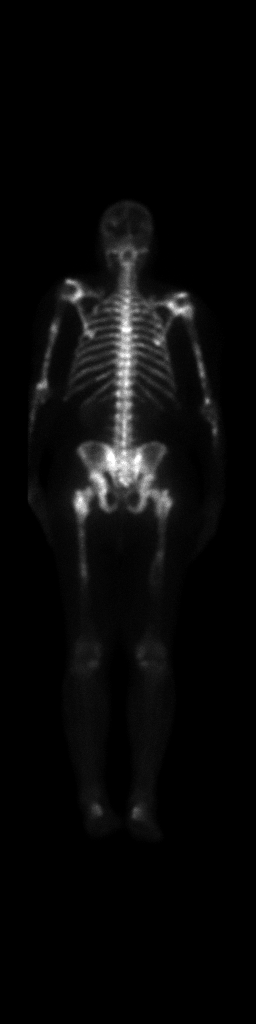

[2 of 2 positions shown; findings below may reference images not displayed]

FINDINGS: There is diffuse abnormal increased radiotracer activity within the
axial and proximal appendicular skeleton compatible with widespread
metastatic disease. Patchy areas of increased uptake within the
shafts of both humeri and femurs noted are compatible with areas of
metastasis. Diminished physiologic soft tissue and renal activity
compatible with super scan.
IMPRESSION: 1. Imaging findings compatible with widespread osseous metastatic
disease involving the axial and proximal appendicular skeleton.

## 2019-10-01 ENCOUNTER — Telehealth: Payer: Self-pay | Admitting: *Deleted

## 2019-10-01 NOTE — Telephone Encounter (Signed)
[name] called to let Dr [name] know that Mr Poynter has missed ~ 11 days of his Zytiga. He received his refill. Daughter noticed that he has 47 tablets left in previous prescription. Mr Mcnair is adamant that he has not missed any doses. She is going to try to figure out a way to help with compliance.

## 2019-10-07 ENCOUNTER — Ambulatory Visit: Payer: Medicare Other

## 2019-10-07 ENCOUNTER — Other Ambulatory Visit: Payer: Medicare Other

## 2019-10-14 ENCOUNTER — Other Ambulatory Visit: Payer: Medicare Other

## 2019-10-14 ENCOUNTER — Ambulatory Visit: Payer: Medicare Other

## 2019-10-16 ENCOUNTER — Other Ambulatory Visit: Payer: Self-pay

## 2019-10-16 ENCOUNTER — Ambulatory Visit (HOSPITAL_COMMUNITY)
Admission: RE | Admit: 2019-10-16 | Discharge: 2019-10-16 | Disposition: A | Discharge status: Home / Self Care | Payer: Medicare Other | Source: Ambulatory Visit | Attending: Hematology | Admitting: Hematology

## 2019-10-16 DIAGNOSIS — C7951 Secondary malignant neoplasm of bone: Secondary | ICD-10-CM

## 2019-10-16 DIAGNOSIS — C61 Malignant neoplasm of prostate: Secondary | ICD-10-CM

## 2019-10-16 MED ORDER — AXUMIN (FLUCICLOVINE F 18) INJECTION
10.0000 | Freq: Once | INTRAVENOUS | Status: AC
  Administered 2019-10-16: 10 via INTRAVENOUS

## 2019-10-17 ENCOUNTER — Other Ambulatory Visit: Payer: Self-pay | Admitting: *Deleted

## 2019-10-17 DIAGNOSIS — C61 Malignant neoplasm of prostate: Secondary | ICD-10-CM

## 2019-10-17 MED ORDER — FENTANYL 25 MCG/HR TD PT72: 1.0000 | MEDICATED_PATCH | TRANSDERMAL | 0 refills | Status: DC

## 2019-10-17 MED ORDER — OXYCODONE HCL 5 MG PO TABS: 5.0000 mg | ORAL_TABLET | ORAL | 0 refills | Status: DC | PRN

## 2019-10-17 NOTE — Telephone Encounter (Signed)
Daughter called -patient needs refill of Fentanyl patches and pain pills

## 2019-10-21 ENCOUNTER — Ambulatory Visit: Payer: Medicare Other

## 2019-10-21 ENCOUNTER — Other Ambulatory Visit: Payer: Medicare Other

## 2019-10-23 NOTE — Progress Notes (Signed)
HEMATOLOGY/ONCOLOGY CLINIC NOTE  Date of Service: 10/24/19    Patient Care Team: Tony Barrack, MD as PCP - General (Family Medicine)  CHIEF COMPLAINTS/PURPOSE OF CONSULTATION:  -metastatic prostate cancer -Myelopthisic anemia  HISTORY OF PRESENTING ILLNESS:   Tony [name] is a wonderful 86 y.o. male who has been referred to [place] by Dr Tony [name] for evaluation and management of anemia. He is accompanied today by his daughter. The pt reports that he is doing well overall.   The pt reports a new onset of fatigue that began in January 2019. His daughter notes being able to tell a difference in his energy levels as early as November 2018. He notes that some cramping pain in his thighs. He notes that his recent 07/23/17 blood transfusion has led to a "terrific, immediate change" in his thigh pain. However, his thigh pain has returned in the last couple days.  He notes that he takes 1010mg Vitamin B12 daily.   He notes that for 6-7 years he took iron pills after his 2012 heart attack and stroke. He notes that he took a statin for 6-7 months and was taken off of it recently. He notes no unresolved symptoms from his stroke.   The pt notes that over the last 4-5 months his appetite has diminished and he has subsequently lost about 20 lbs in that time.  He notes that he has historically not preferred to drink water as such, but hydrates with juice, coffee, and other drinks.  He notes that he believes that he is able to take care of himself adequately at home. He also notes that his cane is sufficient for helping him get around.   Most recent lab results (07/22/17) of CBC  is as follows: all values are WNL except for RBC at 2.94, Hgb at 8.7, HCT at 27.2, RDW at 26.5, Platelets at 134k. CBC from 10/30/16 revealed all values WNL.  CMP 07/21/17 revealed all values WNL except for Sodium at 134, Glucose at 101, Creatinine at 1.85, Calcium at 7.9, Total Protein at 5.7, Albumin at 2.7, Alk Phos at  433.  LDH 07/20/17 elevated at 256. Haptoglobin 07/20/17 elevated at 357.  Vitamin B12 07/06/17 is WNL at 229.   On review of systems, pt reports decreased appetite, losing 20 lbs over 4-5 months, bilateral thigh pain, fatigue, mild leg swelling, and denies fevers, chills, night sweats, bone pains, nausea, abdominal pains, bleeding, blood in the urine, blood in the stools, black stools, changes in bowel habits, light headednss, dizziness, abdominal pains, noticing any new lumps or bumps, testicular pain or swelling, and any other symptoms.   On PMHx the pt reports heart attack and stroke in 2012. On Social Hx the pt denies much ETOH consumption.   Interval History:  Mr. Tony Althausreturns today regarding his metastatic prostate cancer and myelopthisic anemia. We are joined by pt's daughter. The patient's last visit with [place] on 09/[place]/19. The pt reports that he is doing well overall.  The pt reports he is good. He does have knee pain from arthritis and gout but no other pains. Pt has pain while walking. He missed about 10 days worth of Zytiga over the course of a month.   Of note since the patient's last visit, pt has had PET (Axumin) Skull Base to Mid Thigh ([id number] completed on 10/16/19 with results revealing "1. No evidence new metastatic prostate cancer 2. No evidence prostate cancer nodal metastasis or visceral metastasis. 3. Again demonstrate  diffuse sclerotic skeletal metastasis. Majority of these sclerotic skeletal lesions do not have radiotracer activity. There are several discrete foci of radiotracer activity primary in the pelvis and some scattered activity in the spine. Activity is similar in the pelvis and proximal long bones. Potentia mild increase in activity in the lower thoracic spine and upper lumbar spine."  Lab results today (10/24/19) of CBC w/diff and CMP is as follows: all values are WNL except for RBC at 3.03, Hemoglobin at 10, HCT at 32.4, MCV at 106.9, Abs immature  Granulocytes at 0.25, Glucose at 103, BUN at 27, Creatinine at 1.99, Calcium at 8.6, Total Protein at 6, Albumin at 3.1, Alkaline Phosphatase at 224, GFR, Est Non Af Am at 29, GFR, Est AFR Am at 34  On review of systems, pt reports knee pain, healthy appetite, sleeping well, irregular bowl movements and denies back pain, abdominal pain, problems passing urine and any other symptoms.   MEDICAL HISTORY:  Past Medical History:  Diagnosis Date  . BPH associated with nocturia    flomax 0.4--> 0.8 mg trial. nocturia if has coffee. some incontinence  . CAD (coronary artery disease)    LAD Stent 2012. 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   . Gout    no rx. apparently 1x in past  . History of stroke    no aspirin before stroke. no deficits. slurred words at time of stroke  . Hyperlipidemia   . Hypertension     SURGICAL HISTORY: Past Surgical History:  Procedure Laterality Date  . CARDIAC SURGERY     Stint  . CORONARY STENT PLACEMENT    . left arm fracture s/p surgery    . right leg fracture s.p surgery- screws like arm      SOCIAL HISTORY: Social History   Socioeconomic History  . Marital status: Married    Spouse name: Not on file  . Number of children: Not on file  . Years of education: Not on file  . Highest education level: Not on file  Occupational History  . Not on file  Tobacco Use  . Smoking status: Former Smoker    Packs/day: 0.50    Years: 1.00    Pack years: 0.50    Types: Cigarettes    Quit date: 05/01/1952    Years since quitting: 67.5  . Smokeless tobacco: Never Used  Vaping Use  . Vaping Use: Never used  Substance and Sexual Activity  . Alcohol use: No  . Drug use: No  . Sexual activity: Not on file  Other Topics Concern  . Not on file  Social History Narrative   Married- lives separate from wife. 2 children. Daughter passed from heart attack.       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 Difficulty of Paying Living Expenses:   Food Insecurity:   . Worried About Charity fundraiser in the Last Year:   . Arboriculturist in the Last Year:   Transportation Needs:   . Film/video editor (Medical):   Marland Kitchen Lack of Transportation (Non-Medical):   Physical Activity:   . Days of Exercise per Week:   . Minutes of Exercise per Session:   Stress:   . Feeling of Stress :   Social Connections:   . Frequency of Communication with Friends and Family:   .  Frequency of Social Gatherings with Friends and Family:   . Attends Religious Services:   . Active Member of Clubs or Organizations:   . Attends Archivist Meetings:   Marland Kitchen Marital Status:   Intimate Partner Violence:   . Fear of Current or Ex-Partner:   . Emotionally Abused:   Marland Kitchen Physically Abused:   . Sexually Abused:     FAMILY HISTORY: Family History  Problem Relation Age of Onset  . Breast cancer Mother   . Heart disease Father   . Heart disease Brother        x2  . Prostate cancer Brother     ALLERGIES:  is allergic to gabapentin.  MEDICATIONS:  Current Outpatient Medications  Medication Sig Dispense Refill  . abiraterone acetate (ZYTIGA) 250 MG tablet TAKE 4 TABLETS (1,000 MG TOTAL) BY MOUTH DAILY. TAKE ON AN EMPTY STOMACH 1 HOUR BEFORE OR 2 HOURS AFTER A MEAL 120 tablet 0  . aspirin EC 81 MG tablet Take 1 tablet (81 mg total) by mouth daily. 90 tablet 3  . BIDIL 20-37.5 MG tablet TAKE 1 TABLET BY MOUTH TWICE DAILY 180 tablet 3  . calcium-vitamin D (OSCAL 500/200 D-3) 500-200 MG-UNIT tablet Take 1 tablet by mouth daily with breakfast. 30 tablet 2  . fentaNYL (DURAGESIC) 25 MCG/HR Place 1 patch onto the skin every 3 (three) days. 10 patch 0  . furosemide (LASIX) 20 MG tablet Take 1 tablet (20 mg total) by mouth daily as needed for fluid or edema. 30 tablet 3  . metoprolol succinate (TOPROL-XL) 25 MG 24 hr tablet Take 1 tablet (25 mg  total) by mouth daily. 90 tablet 3  . Multiple Vitamins-Minerals (MULTI COMPLETE PO) Take by mouth.    . nitroGLYCERIN (NITROSTAT) 0.4 MG SL tablet Place 1 tablet (0.4 mg total) under the tongue every 5 (five) minutes as needed for chest pain (3 maximum before seeking care). 30 tablet 0  . Omega-3 1000 MG CAPS Take 1 capsule by mouth daily.     Marland Kitchen oxyCODONE (OXY IR/ROXICODONE) 5 MG immediate release tablet Take 1 tablet (5 mg total) by mouth every 4 (four) hours as needed for severe pain. 90 tablet 0  . polyethylene glycol (MIRALAX) packet Take 17 g by mouth daily. 30 each 1  . predniSONE (DELTASONE) 5 MG tablet Take 1 tablet (5 mg total) by mouth 2 (two) times daily with a meal. 180 tablet 2  . senna-docusate (SENNA S) 8.6-50 MG tablet Take 2 tablets by mouth 2 (two) times daily. 120 tablet 2  . tamsulosin (FLOMAX) 0.4 MG CAPS capsule TAKE 1 CAPSULE BY MOUTH TWICE DAILY 180 capsule 3  . vitamin B-12 (CYANOCOBALAMIN) 1000 MCG tablet Take 1 tablet (1,000 mcg total) by mouth daily. 30 tablet 0  . Vitamin D, Ergocalciferol, (DRISDOL) 1.25 MG (50000 UNIT) CAPS capsule Take 1 capsule Monday, Wednesday and Friday. 36 capsule 3  . gabapentin (NEURONTIN) 100 MG capsule Take 2 capsules (200 mg total) by mouth at bedtime. (Patient not taking: Reported on 10/24/2019) 42 capsule 0  . predniSONE (DELTASONE) 5 MG tablet 6 tab x 1 day, 5 tab x 1 day, 4 tab x 1 day, 3 tab x 1 day, 2 tab x 1 day, 1 tab x 1 day, stop (Patient not taking: Reported on 10/24/2019) 21 tablet 0  . valACYclovir (VALTREX) 500 MG tablet Take 1 tablet (500 mg total) by mouth 3 (three) times daily. (Patient not taking: Reported on 10/24/2019) 21 tablet 0  No current facility-administered medications for this visit.    REVIEW OF SYSTEMS:   A 10+ POINT REVIEW OF SYSTEMS WAS OBTAINED including neurology, dermatology, psychiatry, cardiac, respiratory, lymph, extremities, GI, GU, Musculoskeletal, constitutional, breasts, reproductive, HEENT.  All  pertinent positives are noted in the HPI.  All others are negative.   PHYSICAL EXAMINATION: ECOG FS:2 - Symptomatic, <50% confined to bed  Vitals:   10/24/19 1243  BP: (!) 149/78  Pulse: 92  Resp: 18  Temp: 97.6 F (36.4 C)  SpO2: 100%   Wt Readings from Last 3 Encounters:  10/24/19 185 lb 1.6 oz (84 kg)  09/23/19 181 lb 3.2 oz (82.2 kg)  08/27/19 180 lb 14.4 oz (82.1 kg)   Exam was given in wheelchair   GENERAL:alert, in no acute distress and comfortable SKIN: no acute rashes, no significant lesions EYES: conjunctiva are pink and non-injected, sclera anicteric OROPHARYNX: MMM, no exudates, no oropharyngeal erythema or ulceration NECK: supple, no JVD LYMPH:  no palpable lymphadenopathy in the cervical, axillary or inguinal regions LUNGS: clear to auscultation b/l with normal respiratory effort HEART: regular rate & rhythm ABDOMEN:  normoactive bowel sounds , non tender, not distended. Extremity: trace pedal edema PSYCH: alert & oriented x 3 with fluent speech NEURO: no focal motor/sensory deficits  LABORATORY DATA:  I have reviewed the data as listed  . CBC Latest Ref Rng & Units 10/24/2019 09/23/2019 08/27/2019  WBC 4.0 - 10.5 K/uL 6.6 6.6 6.3  Hemoglobin 13.0 - 17.0 g/dL 10.0(L) 10.2(L) 10.6(L)  Hematocrit 39 - 52 % 32.4(L) 33.2(L) 34.3(L)  Platelets 150 - 400 K/uL 175 161 156   . CBC    Component Value Date/Time   WBC 6.6 10/24/2019 1131   RBC 3.03 (L) 10/24/2019 1131   HGB 10.0 (L) 10/24/2019 1131   HGB 9.7 (L) 05/31/2018 1050   HCT 32.4 (L) 10/24/2019 1131   HCT 21.9 (L) 07/20/2017 2103   PLT 175 10/24/2019 1131   PLT 231 05/31/2018 1050   MCV 106.9 (H) 10/24/2019 1131   MCH 33.0 10/24/2019 1131   MCHC 30.9 10/24/2019 1131   RDW 15.0 10/24/2019 1131   LYMPHSABS 1.4 10/24/2019 1131   MONOABS 0.7 10/24/2019 1131   EOSABS 0.0 10/24/2019 1131   BASOSABS 0.0 10/24/2019 1131     . CMP Latest Ref Rng & Units 10/24/2019 09/23/2019 08/27/2019  Glucose 70 -  99 mg/dL 103(H) 100(H) 98  BUN 8 - 23 mg/dL 27(H) 29(H) 31(H)  Creatinine 0.61 - 1.24 mg/dL 1.99(H) 2.12(H) 1.83(H)  Sodium 135 - 145 mmol/L 141 138 137  Potassium 3.5 - 5.1 mmol/L 4.3 4.3 4.2  Chloride 98 - 111 mmol/L 104 103 103  CO2 22 - 32 mmol/L '27 29 25  '$ Calcium 8.9 - 10.3 mg/dL 8.6(L) 8.5(L) 7.9(L)  Total Protein 6.5 - 8.1 g/dL 6.0(L) 6.4(L) 6.3(L)  Total Bilirubin 0.3 - 1.2 mg/dL 0.8 0.7 0.9  Alkaline Phos 38 - 126 U/L 224(H) 174(H) 144(H)  AST 15 - 41 U/L '17 17 17  '$ ALT 0 - 44 U/L '13 14 14    '$ Component     Latest Ref Rng & Units 07/30/2017 09/06/2017 10/04/2017 10/31/2017  Prostate Specific Ag, Serum     0.0 - 4.0 ng/mL 1,186.0 (H) 433.8 (H) 67.4 (H) 55.8 (H)   07/31/17 BM Bx:   RADIOGRAPHIC STUDIES: I have personally reviewed the radiological images as listed and agreed with the findings in the report. NM PET (AXUMIN) SKULL BASE TO MID THIGH  Result Date:  10/17/2019 CLINICAL DATA:  Prostate carcinoma with biochemical recurrence. 86 year old male. EXAM: NUCLEAR MEDICINE PET SKULL BASE TO THIGH TECHNIQUE: 10.0 mCi F-18 Fluciclovine was injected intravenously. Full-ring PET imaging was performed from the skull base to thigh after the radiotracer. CT data was obtained and used for attenuation correction and anatomic localization. COMPARISON:  Axumin PET scan 04/15/2019 FINDINGS: NECK No radiotracer activity in neck lymph nodes. Incidental CT finding: None CHEST No radiotracer accumulation within mediastinal or hilar lymph nodes. No suspicious pulmonary nodules on the CT scan. Incidental CT finding: None ABDOMEN/PELVIS Prostate: Fairly intense activity within the prostate gland without focality. SUV max equal 5.6 which is similar to 4.7 on comparison exam. Lymph nodes: No abnormal radiotracer accumulation within pelvic or abdominal nodes. Liver: No evidence of liver metastasis Incidental CT finding: Extensive sigmoid diverticulosis. SKELETON Diffuse confluent sclerotic skeletal metastasis again  demonstrated. On the background of the diffuse sclerotic skeletal metastasis, there are several foci of moderate radiotracer activity. For example in the LEFT lesser trochanter lesion with SUV max equal 5.7 compared SUV max equal 4.5. Within the posterior RIGHT acetabulum SUV max equal 4.7 compared to 4.4. Activity the T10 vertebral body appears slightly increased with SUV max equal 3.9 this compares to SUV max equal 3.0 on comparison exam. IMPRESSION: 1. No evidence new metastatic prostate cancer 2. No evidence prostate cancer nodal metastasis or visceral metastasis. 3. Again demonstrate diffuse sclerotic skeletal metastasis. Majority of these sclerotic skeletal lesions do not have radiotracer activity. There are several discrete foci of radiotracer activity primary in the pelvis and some scattered activity in the spine. Activity is similar in the pelvis and proximal long bones. Potential mild increase in activity in the lower thoracic spine and upper lumbar spine. Electronically Signed   By: Suzy Bouchard M.D.   On: 10/17/2019 12:42    ASSESSMENT & PLAN:   86 y.o. male with  1. Normocytic Anemia - due to primarily metastatic prostatic cancer (ACD + BM involvement with prostate cancer causing myelopthisic picture)  -Pt presented with a Hgb at 8.7 on 07/22/17, improved to 10.3 after receiving PRBC transfusion. -In review of the patient's previous CBC records, his anemia developed 6-7 months before presenting to care with me, unaccompanied by a drop in his EPO (07/20/17 elevated at 249.8);  LDH elevated  but haptoglobin at 357 on 07/20/17. -suggested against active hemolysis.  2. Recently diagnosed metastatic prostate cancer with extensive bone metastases. With bone mets/BM Mets and LNadenopathy. Exam positive for left retroperitoneal, bilateral pelvis and right inguinal adenopathy. Exam positive for left retroperitoneal, bilateral pelvis and right inguinal adenopathy. Diffuse sclerotic bone  metastasis.   3. Elevated alkaline phosphatase due to bone metastases from prostate cancer -CT BM bx -concerning for significant bone lesions in lower spine and pelvis PSA levels have declined to 26.9  4. Cancer related pain - primarily in b/l thighs. -much improved.  PLAN:  -Discussed pt labwork today, 10/24/19; of CBC w/diff and CMP is as follows: all values are WNL except for RBC at 3.03, Hemoglobin at 10, HCT at 32.4, MCV at 106.9, Abs immature Granulocytes at 0.25, Glucose at 103, BUN at 27, Creatinine at 1.99, Calcium at 8.6, Total Protein at 6, Albumin at 3.1, Alkaline Phosphatase at 224, GFR, Est Non Af Am at 29, GFR, Est AFR Am at 34 -Discussed 10/16/19 of PET (Axumin) Skull Base to Mid Thigh ([id number])  -Advised mild anemia but no changes  -Advised on knee pain -likely not due to cancer  -Advised soft  knee brace or KT tape  -Advised exercising may not help knee pain  -Advised seeing orthopedic doctor for knee pain  -Advised some cancer progression  -Elevated ALP may be due to Zytiga - not prohibitive at this time -The pt has no prohibitive toxicities from continuing 1000 mg Zytiga at this time. -Tumor markers continue to elevate slowly, but there are no signs of clinically symptomatic progression -Will continue Zytiga until clinically significant progression or intolerance. No indication to change treatment at this time -Advised pt that we will likely need to progress to more aggressive treatments overtime, but would not want to subject him to these treatments unless necessary -pt has some areas of progression but not translating to symptoms or blood count changes  -Advised on local radiation -pt does not need currently   -Advised on chemotherapy -pt does not need currently  -Advised on radioactive radium -pt does not need currently -Advised on not driving  -Continue taking Vitamin D supplement  -Continue Xgeva q4weeks  -Continue Lupron q12weeks  -Will see back in 8 weeks  with labs   FOLLOW UP: F/u for Xgeva q4weeks --plz schedule next 6 appointments Continue Lupron q12 weeks-- plz schedule next 4 appointments Labs every 4 weeks with each treatment RTC with Dr [name] with labs in 8 weeks  The total time spent in the appt was 30 minutes and more than 50% was on counseling and direct patient cares.  All of the patient's questions were answered with apparent satisfaction. The patient knows to call the clinic with any problems, questions or concerns.   Sullivan Lone MD MS AAHIVMS [facility] Hematology/Oncology Physician [facility]  (Office):       [phone] (Work cell):  [phone] (Fax):           [phone]  10/24/2019 1:24 PM  I, Dawayne Cirri am acting as a Education administrator for Dr. Sullivan Lone.   .I have reviewed the above documentation for accuracy and completeness, and I agree with the above. Brunetta Genera MD

## 2019-10-24 ENCOUNTER — Other Ambulatory Visit: Payer: Self-pay

## 2019-10-24 ENCOUNTER — Inpatient Hospital Stay (HOSPITAL_BASED_OUTPATIENT_CLINIC_OR_DEPARTMENT_OTHER): Payer: Medicare Other | Admitting: Hematology

## 2019-10-24 ENCOUNTER — Inpatient Hospital Stay: Payer: Medicare Other | Attending: Hematology

## 2019-10-24 ENCOUNTER — Inpatient Hospital Stay: Payer: Medicare Other

## 2019-10-24 VITALS — BP 149/78 | HR 92 | Temp 97.6°F | Resp 18 | Ht 67.0 in | Wt 185.1 lb

## 2019-10-24 VITALS — BP 154/80 | HR 94 | Temp 98.1°F | Resp 18

## 2019-10-24 DIAGNOSIS — C7951 Secondary malignant neoplasm of bone: Secondary | ICD-10-CM | POA: Insufficient documentation

## 2019-10-24 DIAGNOSIS — C61 Malignant neoplasm of prostate: Secondary | ICD-10-CM | POA: Diagnosis present

## 2019-10-24 DIAGNOSIS — G893 Neoplasm related pain (acute) (chronic): Secondary | ICD-10-CM

## 2019-10-24 DIAGNOSIS — D649 Anemia, unspecified: Secondary | ICD-10-CM | POA: Diagnosis not present

## 2019-10-24 DIAGNOSIS — Z79899 Other long term (current) drug therapy: Secondary | ICD-10-CM | POA: Diagnosis not present

## 2019-10-24 LAB — CMP (CANCER CENTER ONLY)
ALT: 13 U/L (ref 0–44)
AST: 17 U/L (ref 15–41)
Albumin: 3.1 g/dL — ABNORMAL LOW (ref 3.5–5.0)
Alkaline Phosphatase: 224 U/L — ABNORMAL HIGH (ref 38–126)
Anion gap: 10 (ref 5–15)
BUN: 27 mg/dL — ABNORMAL HIGH (ref 8–23)
CO2: 27 mmol/L (ref 22–32)
Calcium: 8.6 mg/dL — ABNORMAL LOW (ref 8.9–10.3)
Chloride: 104 mmol/L (ref 98–111)
Creatinine: 1.99 mg/dL — ABNORMAL HIGH (ref 0.61–1.24)
GFR, Est AFR Am: 34 mL/min — ABNORMAL LOW (ref >60)
GFR, Estimated: 29 mL/min — ABNORMAL LOW (ref >60)
Glucose, Bld: 103 mg/dL — ABNORMAL HIGH (ref 70–99)
Potassium: 4.3 mmol/L (ref 3.5–5.1)
Sodium: 141 mmol/L (ref 135–145)
Total Bilirubin: 0.8 mg/dL (ref 0.3–1.2)
Total Protein: 6 g/dL — ABNORMAL LOW (ref 6.5–8.1)

## 2019-10-24 LAB — CBC WITH DIFFERENTIAL/PLATELET
Abs Immature Granulocytes: 0.25 10*3/uL — ABNORMAL HIGH (ref 0.00–0.07)
Basophils Absolute: 0 10*3/uL (ref 0.0–0.1)
Basophils Relative: 0 %
Eosinophils Absolute: 0 10*3/uL (ref 0.0–0.5)
Eosinophils Relative: 1 %
HCT: 32.4 % — ABNORMAL LOW (ref 39.0–52.0)
Hemoglobin: 10 g/dL — ABNORMAL LOW (ref 13.0–17.0)
Immature Granulocytes: 4 %
Lymphocytes Relative: 22 %
Lymphs Abs: 1.4 10*3/uL (ref 0.7–4.0)
MCH: 33 pg (ref 26.0–34.0)
MCHC: 30.9 g/dL (ref 30.0–36.0)
MCV: 106.9 fL — ABNORMAL HIGH (ref 80.0–100.0)
Monocytes Absolute: 0.7 10*3/uL (ref 0.1–1.0)
Monocytes Relative: 10 %
Neutro Abs: 4.2 10*3/uL (ref 1.7–7.7)
Neutrophils Relative %: 63 %
Platelets: 175 10*3/uL (ref 150–400)
RBC: 3.03 MIL/uL — ABNORMAL LOW (ref 4.22–5.81)
RDW: 15 % (ref 11.5–15.5)
WBC: 6.6 10*3/uL (ref 4.0–10.5)
nRBC: 0.6 % — ABNORMAL HIGH (ref 0.0–0.2)

## 2019-10-24 MED ORDER — DENOSUMAB 120 MG/1.7ML ~~LOC~~ SOLN
SUBCUTANEOUS | Status: AC
  Filled 2019-10-24: qty 1.7

## 2019-10-24 MED ORDER — DENOSUMAB 120 MG/1.7ML ~~LOC~~ SOLN
120.0000 mg | Freq: Once | SUBCUTANEOUS | Status: AC
  Administered 2019-10-24: 120 mg via SUBCUTANEOUS

## 2019-10-24 MED ORDER — LEUPROLIDE ACETATE (3 MONTH) 22.5 MG ~~LOC~~ KIT
PACK | SUBCUTANEOUS | Status: AC
  Filled 2019-10-24: qty 22.5

## 2019-10-24 MED ORDER — LEUPROLIDE ACETATE (3 MONTH) 22.5 MG ~~LOC~~ KIT
22.5000 mg | PACK | Freq: Once | SUBCUTANEOUS | Status: AC
  Administered 2019-10-24: 22.5 mg via SUBCUTANEOUS

## 2019-10-24 NOTE — Patient Instructions (Signed)
Leuprolide depot injection What is this medicine? LEUPROLIDE (loo PROE lide) is a man-made protein that acts like a natural hormone in the body. It decreases testosterone in men and decreases estrogen in women. In men, this medicine is used to treat advanced prostate cancer. In women, some forms of this medicine may be used to treat endometriosis, uterine fibroids, or other male hormone-related problems. This medicine may be used for other purposes; ask your health care provider or pharmacist if you have questions. COMMON BRAND NAME(S): Eligard, Fensolv, Lupron Depot, Lupron Depot-Ped, Viadur What should I tell my health care provider before I take this medicine? They need to know if you have any of these conditions:  diabetes  heart disease or previous heart attack  high blood pressure  high cholesterol  mental illness  osteoporosis  pain or difficulty passing urine  seizures  spinal cord metastasis  stroke  suicidal thoughts, plans, or attempt; a previous suicide attempt by you or a family member  tobacco smoker  unusual vaginal bleeding (women)  an unusual or allergic reaction to leuprolide, benzyl alcohol, other medicines, foods, dyes, or preservatives  pregnant or trying to get pregnant  breast-feeding How should I use this medicine? This medicine is for injection into a muscle or for injection under the skin. It is given by a health care professional in a hospital or clinic setting. The specific product will determine how it will be given to you. Make sure you understand which product you receive and how often you will receive it. Talk to your pediatrician regarding the use of this medicine in children. Special care may be needed. Overdosage: If you think you have taken too much of this medicine contact a poison control center or emergency room at once. NOTE: This medicine is only for you. Do not share this medicine with others. What if I miss a dose? It is  important not to miss a dose. Call your doctor or health care professional if you are unable to keep an appointment. Depot injections: Depot injections are given either once-monthly, every 12 weeks, every 16 weeks, or every 24 weeks depending on the product you are prescribed. The product you are prescribed will be based on if you are male or male, and your condition. Make sure you understand your product and dosing. What may interact with this medicine? Do not take this medicine with any of the following medications:  chasteberry This medicine may also interact with the following medications:  herbal or dietary supplements, like black cohosh or DHEA  male hormones, like estrogens or progestins and birth control pills, patches, rings, or injections  male hormones, like testosterone This list may not describe all possible interactions. Give your health care provider a list of all the medicines, herbs, non-prescription drugs, or dietary supplements you use. Also tell them if you smoke, drink alcohol, or use illegal drugs. Some items may interact with your medicine. What should I watch for while using this medicine? Visit your doctor or health care professional for regular checks on your progress. During the first weeks of treatment, your symptoms may get worse, but then will improve as you continue your treatment. You may get hot flashes, increased bone pain, increased difficulty passing urine, or an aggravation of nerve symptoms. Discuss these effects with your doctor or health care professional, some of them may improve with continued use of this medicine. Male patients may experience a menstrual cycle or spotting during the first months of therapy with this medicine.   If this continues, contact your doctor or health care professional. This medicine may increase blood sugar. Ask your healthcare provider if changes in diet or medicines are needed if you have diabetes. What side effects may I  notice from receiving this medicine? Side effects that you should report to your doctor or health care professional as soon as possible:  allergic reactions like skin rash, itching or hives, swelling of the face, lips, or tongue  breathing problems  chest pain  depression or memory disorders  pain in your legs or groin  pain at site where injected or implanted  seizures  severe headache  signs and symptoms of high blood sugar such as being more thirsty or hungry or having to urinate more than normal. You may also feel very tired or have blurry vision  swelling of the feet and legs  suicidal thoughts or other mood changes  visual changes  vomiting Side effects that usually do not require medical attention (report to your doctor or health care professional if they continue or are bothersome):  breast swelling or tenderness  decrease in sex drive or performance  diarrhea  hot flashes  loss of appetite  muscle, joint, or bone pains  nausea  redness or irritation at site where injected or implanted  skin problems or acne This list may not describe all possible side effects. Call your doctor for medical advice about side effects. You may report side effects to FDA at 1-800-FDA-1088. Where should I keep my medicine? This drug is given in a hospital or clinic and will not be stored at home. NOTE: This sheet is a summary. It may not cover all possible information. If you have questions about this medicine, talk to your doctor, pharmacist, or health care provider.  2020 [employer]/Gold Standard (2018-02-14 09:27:03) Denosumab injection What is this medicine? DENOSUMAB (den oh sue mab) slows bone breakdown. Prolia is used to treat osteoporosis in women after menopause and in men, and in people who are taking corticosteroids for 6 months or more. Xgeva is used to treat a high calcium level due to cancer and to prevent bone fractures and other bone problems caused by multiple  myeloma or cancer bone metastases. Xgeva is also used to treat giant cell tumor of the bone. This medicine may be used for other purposes; ask your health care provider or pharmacist if you have questions. COMMON BRAND NAME(S): Prolia, XGEVA What should I tell my health care provider before I take this medicine? They need to know if you have any of these conditions:  dental disease  having surgery or tooth extraction  infection  kidney disease  low levels of calcium or Vitamin D in the blood  malnutrition  on hemodialysis  skin conditions or sensitivity  thyroid or parathyroid disease  an unusual reaction to denosumab, other medicines, foods, dyes, or preservatives  pregnant or trying to get pregnant  breast-feeding How should I use this medicine? This medicine is for injection under the skin. It is given by a health care professional in a hospital or clinic setting. A special MedGuide will be given to you before each treatment. Be sure to read this information carefully each time. For Prolia, talk to your pediatrician regarding the use of this medicine in children. Special care may be needed. For Xgeva, talk to your pediatrician regarding the use of this medicine in children. While this drug may be prescribed for children as young as 13 years for selected conditions, precautions do apply. Overdosage:   If you think you have taken too much of this medicine contact a poison control center or emergency room at once. NOTE: This medicine is only for you. Do not share this medicine with others. What if I miss a dose? It is important not to miss your dose. Call your doctor or health care professional if you are unable to keep an appointment. What may interact with this medicine? Do not take this medicine with any of the following medications:  other medicines containing denosumab This medicine may also interact with the following medications:  medicines that lower your chance of  fighting infection  steroid medicines like prednisone or cortisone This list may not describe all possible interactions. Give your health care provider a list of all the medicines, herbs, non-prescription drugs, or dietary supplements you use. Also tell them if you smoke, drink alcohol, or use illegal drugs. Some items may interact with your medicine. What should I watch for while using this medicine? Visit your doctor or health care professional for regular checks on your progress. Your doctor or health care professional may order blood tests and other tests to see how you are doing. Call your doctor or health care professional for advice if you get a fever, chills or sore throat, or other symptoms of a cold or flu. Do not treat yourself. This drug may decrease your body's ability to fight infection. Try to avoid being around people who are sick. You should make sure you get enough calcium and vitamin D while you are taking this medicine, unless your doctor tells you not to. Discuss the foods you eat and the vitamins you take with your health care professional. See your dentist regularly. Brush and floss your teeth as directed. Before you have any dental work done, tell your dentist you are receiving this medicine. Do not become pregnant while taking this medicine or for 5 months after stopping it. Talk with your doctor or health care professional about your birth control options while taking this medicine. Women should inform their doctor if they wish to become pregnant or think they might be pregnant. There is a potential for serious side effects to an unborn child. Talk to your health care professional or pharmacist for more information. What side effects may I notice from receiving this medicine? Side effects that you should report to your doctor or health care professional as soon as possible:  allergic reactions like skin rash, itching or hives, swelling of the face, lips, or tongue  bone  pain  breathing problems  dizziness  jaw pain, especially after dental work  redness, blistering, peeling of the skin  signs and symptoms of infection like fever or chills; cough; sore throat; pain or trouble passing urine  signs of low calcium like fast heartbeat, muscle cramps or muscle pain; pain, tingling, numbness in the hands or feet; seizures  unusual bleeding or bruising  unusually weak or tired Side effects that usually do not require medical attention (report to your doctor or health care professional if they continue or are bothersome):  constipation  diarrhea  headache  joint pain  loss of appetite  muscle pain  runny nose  tiredness  upset stomach This list may not describe all possible side effects. Call your doctor for medical advice about side effects. You may report side effects to FDA at 1-800-FDA-1088. Where should I keep my medicine? This medicine is only given in a clinic, doctor's office, or other health care setting and will not be   stored at home. NOTE: This sheet is a summary. It may not cover all possible information. If you have questions about this medicine, talk to your doctor, pharmacist, or health care provider.  2020 [employer]/Gold Standard (2017-08-24 16:10:44)  

## 2019-10-27 ENCOUNTER — Other Ambulatory Visit: Payer: Self-pay | Admitting: Hematology

## 2019-10-27 DIAGNOSIS — C61 Malignant neoplasm of prostate: Secondary | ICD-10-CM

## 2019-10-27 NOTE — Telephone Encounter (Signed)
Patient request refill

## 2019-11-03 MED FILL — ABIRATERONE ACETATE 250 MG: 250 | 30 days supply | Qty: 120 | Fill #0

## 2019-11-14 NOTE — Telephone Encounter (Signed)
Pt request refill.

## 2019-11-18 ENCOUNTER — Ambulatory Visit: Payer: Medicare Other

## 2019-11-18 ENCOUNTER — Inpatient Hospital Stay: Payer: Medicare Other

## 2019-11-18 ENCOUNTER — Other Ambulatory Visit: Payer: Self-pay

## 2019-11-18 ENCOUNTER — Other Ambulatory Visit: Payer: Medicare Other

## 2019-11-18 ENCOUNTER — Inpatient Hospital Stay: Payer: Medicare Other | Attending: Hematology

## 2019-11-18 VITALS — BP 147/82 | HR 86 | Resp 17

## 2019-11-18 DIAGNOSIS — Z79899 Other long term (current) drug therapy: Secondary | ICD-10-CM | POA: Insufficient documentation

## 2019-11-18 DIAGNOSIS — C7951 Secondary malignant neoplasm of bone: Secondary | ICD-10-CM | POA: Diagnosis not present

## 2019-11-18 DIAGNOSIS — C61 Malignant neoplasm of prostate: Secondary | ICD-10-CM

## 2019-11-18 DIAGNOSIS — G893 Neoplasm related pain (acute) (chronic): Secondary | ICD-10-CM

## 2019-11-18 LAB — CBC WITH DIFFERENTIAL/PLATELET
Abs Immature Granulocytes: 0.13 10*3/uL — ABNORMAL HIGH (ref 0.00–0.07)
Basophils Absolute: 0.1 10*3/uL (ref 0.0–0.1)
Basophils Relative: 1 %
Eosinophils Absolute: 0 10*3/uL (ref 0.0–0.5)
Eosinophils Relative: 0 %
HCT: 33.7 % — ABNORMAL LOW (ref 39.0–52.0)
Hemoglobin: 10.5 g/dL — ABNORMAL LOW (ref 13.0–17.0)
Immature Granulocytes: 2 %
Lymphocytes Relative: 18 %
Lymphs Abs: 1.2 10*3/uL (ref 0.7–4.0)
MCH: 32.8 pg (ref 26.0–34.0)
MCHC: 31.2 g/dL (ref 30.0–36.0)
MCV: 105.3 fL — ABNORMAL HIGH (ref 80.0–100.0)
Monocytes Absolute: 0.7 10*3/uL (ref 0.1–1.0)
Monocytes Relative: 11 %
Neutro Abs: 4.4 10*3/uL (ref 1.7–7.7)
Neutrophils Relative %: 68 %
Platelets: 170 10*3/uL (ref 150–400)
RBC: 3.2 MIL/uL — ABNORMAL LOW (ref 4.22–5.81)
RDW: 14.7 % (ref 11.5–15.5)
WBC: 6.5 10*3/uL (ref 4.0–10.5)
nRBC: 0.9 % — ABNORMAL HIGH (ref 0.0–0.2)

## 2019-11-18 LAB — CMP (CANCER CENTER ONLY)
ALT: 11 U/L (ref 0–44)
AST: 18 U/L (ref 15–41)
Albumin: 3.1 g/dL — ABNORMAL LOW (ref 3.5–5.0)
Alkaline Phosphatase: 244 U/L — ABNORMAL HIGH (ref 38–126)
Anion gap: 11 (ref 5–15)
BUN: 28 mg/dL — ABNORMAL HIGH (ref 8–23)
CO2: 25 mmol/L (ref 22–32)
Calcium: 9.2 mg/dL (ref 8.9–10.3)
Chloride: 101 mmol/L (ref 98–111)
Creatinine: 2.2 mg/dL — ABNORMAL HIGH (ref 0.61–1.24)
GFR, Est AFR Am: 30 mL/min — ABNORMAL LOW (ref >60)
GFR, Estimated: 26 mL/min — ABNORMAL LOW (ref >60)
Glucose, Bld: 99 mg/dL (ref 70–99)
Potassium: 4 mmol/L (ref 3.5–5.1)
Sodium: 137 mmol/L (ref 135–145)
Total Bilirubin: 0.7 mg/dL (ref 0.3–1.2)
Total Protein: 6.2 g/dL — ABNORMAL LOW (ref 6.5–8.1)

## 2019-11-18 MED ORDER — DENOSUMAB 120 MG/1.7ML ~~LOC~~ SOLN
120.0000 mg | Freq: Once | SUBCUTANEOUS | Status: AC
  Administered 2019-11-18: 120 mg via SUBCUTANEOUS

## 2019-11-18 MED ORDER — DENOSUMAB 120 MG/1.7ML ~~LOC~~ SOLN
SUBCUTANEOUS | Status: AC
  Filled 2019-11-18: qty 1.7

## 2019-11-20 ENCOUNTER — Other Ambulatory Visit: Payer: Self-pay | Admitting: *Deleted

## 2019-11-20 DIAGNOSIS — C61 Malignant neoplasm of prostate: Secondary | ICD-10-CM

## 2019-11-20 MED ORDER — FENTANYL 25 MCG/HR TD PT72: 1.0000 | MEDICATED_PATCH | TRANSDERMAL | 0 refills | Status: DC

## 2019-11-25 MED FILL — BIDIL 20-37.5 MG TABS: 20-37.5 | 30 days supply | Qty: 60 | Fill #0

## 2019-11-27 ENCOUNTER — Other Ambulatory Visit: Payer: Self-pay | Admitting: Hematology

## 2019-11-27 DIAGNOSIS — C7951 Secondary malignant neoplasm of bone: Secondary | ICD-10-CM

## 2019-11-28 ENCOUNTER — Telehealth: Payer: Self-pay | Admitting: Family Medicine

## 2019-11-28 NOTE — Progress Notes (Signed)
°  Chronic Care Management   Note  11/28/2019 Name: Tony [name] MRN: [id number] DOB: 07-Aug-1930  Tony [name] is a 86 y.o. year old male who is a primary care patient of [name], MD. I reached out to [name] by phone today in response to a referral sent by [name]'s PCP, [name], MD.   [name] was given information about Chronic Care Management services today including:  1. CCM service includes personalized support from designated clinical staff supervised by his physician, including individualized plan of care and coordination with other care providers 2. 24/7 contact phone numbers for assistance for urgent and routine care needs. 3. Service will only be billed when office clinical staff spend 20 minutes or more in a month to coordinate care. 4. Only one practitioner may furnish and bill the service in a calendar month. 5. The patient may stop CCM services at any time (effective at the end of the month) by phone call to the office staff.    Chronic Care Management   Outreach Note  11/28/2019 Name: Tony [name] MRN: [id number] DOB: 03/07/1931  Referred by: [name], MD Reason for referral : No chief complaint on file.   An unsuccessful telephone outreach was attempted today. The patient was referred to the pharmacist for assistance with care management and care coordination.   Follow Up Plan:   Earney [place] Up[employer] Scheduler

## 2019-12-01 MED FILL — ABIRATERONE ACETATE 250 MG: 250 | 30 days supply | Qty: 120 | Fill #0

## 2019-12-12 ENCOUNTER — Other Ambulatory Visit: Payer: Self-pay | Admitting: *Deleted

## 2019-12-12 ENCOUNTER — Telehealth: Payer: Self-pay | Admitting: *Deleted

## 2019-12-12 DIAGNOSIS — C7951 Secondary malignant neoplasm of bone: Secondary | ICD-10-CM

## 2019-12-12 DIAGNOSIS — C61 Malignant neoplasm of prostate: Secondary | ICD-10-CM

## 2019-12-12 NOTE — Telephone Encounter (Signed)
Daughter contacted office - states father's rollator, bedside commode and air mattress [has pump/goes over mattress] are wearing out. Wheels don't turn, commode is rusting and pump for mattress doesn't work. She requested home health receive new orders for equipment so that it can be replaced. Dr. [name] informed. Orders placed with his authorization for rollator and bedside commode.  Contacted Zach with [employer] [place] (items originally from [employer], per daughter) to ask about air mattress overlay order. Thedore Mins states insurance often will not cover replacements of equipment until equipment is 84 years old. He will contact patient/daughter to schedule repair for mattress/pump and will discuss replacement costs with daughter and will contact office as needed.

## 2019-12-16 ENCOUNTER — Other Ambulatory Visit: Payer: Self-pay

## 2019-12-16 ENCOUNTER — Other Ambulatory Visit: Payer: Self-pay | Admitting: *Deleted

## 2019-12-16 ENCOUNTER — Other Ambulatory Visit: Payer: Medicare Other

## 2019-12-16 ENCOUNTER — Ambulatory Visit: Payer: Medicare Other

## 2019-12-16 ENCOUNTER — Inpatient Hospital Stay: Payer: Medicare Other | Attending: Hematology

## 2019-12-16 ENCOUNTER — Inpatient Hospital Stay: Payer: Medicare Other

## 2019-12-16 ENCOUNTER — Inpatient Hospital Stay (HOSPITAL_BASED_OUTPATIENT_CLINIC_OR_DEPARTMENT_OTHER): Payer: Medicare Other | Admitting: Hematology

## 2019-12-16 VITALS — BP 123/76 | HR 80 | Temp 98.2°F | Resp 18

## 2019-12-16 VITALS — BP 151/84 | HR 88 | Temp 97.7°F | Resp 18 | Ht 67.0 in | Wt 189.7 lb

## 2019-12-16 DIAGNOSIS — C61 Malignant neoplasm of prostate: Secondary | ICD-10-CM | POA: Insufficient documentation

## 2019-12-16 DIAGNOSIS — Z79899 Other long term (current) drug therapy: Secondary | ICD-10-CM | POA: Insufficient documentation

## 2019-12-16 DIAGNOSIS — R748 Abnormal levels of other serum enzymes: Secondary | ICD-10-CM | POA: Insufficient documentation

## 2019-12-16 DIAGNOSIS — C7951 Secondary malignant neoplasm of bone: Secondary | ICD-10-CM | POA: Diagnosis not present

## 2019-12-16 DIAGNOSIS — G893 Neoplasm related pain (acute) (chronic): Secondary | ICD-10-CM | POA: Insufficient documentation

## 2019-12-16 LAB — CBC WITH DIFFERENTIAL/PLATELET
Abs Immature Granulocytes: 0.24 10*3/uL — ABNORMAL HIGH (ref 0.00–0.07)
Basophils Absolute: 0 10*3/uL (ref 0.0–0.1)
Basophils Relative: 1 %
Eosinophils Absolute: 0 10*3/uL (ref 0.0–0.5)
Eosinophils Relative: 0 %
HCT: 32.1 % — ABNORMAL LOW (ref 39.0–52.0)
Hemoglobin: 10 g/dL — ABNORMAL LOW (ref 13.0–17.0)
Immature Granulocytes: 3 %
Lymphocytes Relative: 24 %
Lymphs Abs: 1.8 10*3/uL (ref 0.7–4.0)
MCH: 32.6 pg (ref 26.0–34.0)
MCHC: 31.2 g/dL (ref 30.0–36.0)
MCV: 104.6 fL — ABNORMAL HIGH (ref 80.0–100.0)
Monocytes Absolute: 0.8 10*3/uL (ref 0.1–1.0)
Monocytes Relative: 10 %
Neutro Abs: 4.6 10*3/uL (ref 1.7–7.7)
Neutrophils Relative %: 62 %
Platelets: 178 10*3/uL (ref 150–400)
RBC: 3.07 MIL/uL — ABNORMAL LOW (ref 4.22–5.81)
RDW: 14.3 % (ref 11.5–15.5)
WBC: 7.4 10*3/uL (ref 4.0–10.5)
nRBC: 0.5 % — ABNORMAL HIGH (ref 0.0–0.2)

## 2019-12-16 LAB — CMP (CANCER CENTER ONLY)
ALT: 10 U/L (ref 0–44)
AST: 16 U/L (ref 15–41)
Albumin: 3.2 g/dL — ABNORMAL LOW (ref 3.5–5.0)
Alkaline Phosphatase: 243 U/L — ABNORMAL HIGH (ref 38–126)
Anion gap: 7 (ref 5–15)
BUN: 25 mg/dL — ABNORMAL HIGH (ref 8–23)
CO2: 28 mmol/L (ref 22–32)
Calcium: 9.3 mg/dL (ref 8.9–10.3)
Chloride: 104 mmol/L (ref 98–111)
Creatinine: 2.16 mg/dL — ABNORMAL HIGH (ref 0.61–1.24)
GFR, Est AFR Am: 31 mL/min — ABNORMAL LOW (ref >60)
GFR, Estimated: 27 mL/min — ABNORMAL LOW (ref >60)
Glucose, Bld: 85 mg/dL (ref 70–99)
Potassium: 4.4 mmol/L (ref 3.5–5.1)
Sodium: 139 mmol/L (ref 135–145)
Total Bilirubin: 0.7 mg/dL (ref 0.3–1.2)
Total Protein: 6.3 g/dL — ABNORMAL LOW (ref 6.5–8.1)

## 2019-12-16 MED ORDER — DENOSUMAB 120 MG/1.7ML ~~LOC~~ SOLN
SUBCUTANEOUS | Status: AC
  Filled 2019-12-16: qty 1.7

## 2019-12-16 MED ORDER — DENOSUMAB 120 MG/1.7ML ~~LOC~~ SOLN
120.0000 mg | Freq: Once | SUBCUTANEOUS | Status: AC
  Administered 2019-12-16: 120 mg via SUBCUTANEOUS

## 2019-12-16 NOTE — Progress Notes (Signed)
HEMATOLOGY/ONCOLOGY CLINIC NOTE  Date of Service: 12/16/19    Patient Care Team: Tony Barrack, MD as PCP - General (Family Medicine)  CHIEF COMPLAINTS/PURPOSE OF CONSULTATION:  -metastatic prostate cancer -Myelopthisic anemia  HISTORY OF PRESENTING ILLNESS:   Tony [name] is a wonderful 86 y.o. male who has been referred to [place] by Dr Tony [name] for evaluation and management of anemia. He is accompanied today by his daughter. The pt reports that he is doing well overall.   The pt reports a new onset of fatigue that began in January 2019. His daughter notes being able to tell a difference in his energy levels as early as November 2018. He notes that some cramping pain in his thighs. He notes that his recent 07/23/17 blood transfusion has led to a "terrific, immediate change" in his thigh pain. However, his thigh pain has returned in the last couple days.  He notes that he takes 1021mg Vitamin B12 daily.   He notes that for 6-7 years he took iron pills after his 2012 heart attack and stroke. He notes that he took a statin for 6-7 months and was taken off of it recently. He notes no unresolved symptoms from his stroke.   The pt notes that over the last 4-5 months his appetite has diminished and he has subsequently lost about 20 lbs in that time.  He notes that he has historically not preferred to drink water as such, but hydrates with juice, coffee, and other drinks.  He notes that he believes that he is able to take care of himself adequately at home. He also notes that his cane is sufficient for helping him get around.   Most recent lab results (07/22/17) of CBC  is as follows: all values are WNL except for RBC at 2.94, Hgb at 8.7, HCT at 27.2, RDW at 26.5, Platelets at 134k. CBC from 10/30/16 revealed all values WNL.  CMP 07/21/17 revealed all values WNL except for Sodium at 134, Glucose at 101, Creatinine at 1.85, Calcium at 7.9, Total Protein at 5.7, Albumin at 2.7, Alk Phos at  433.  LDH 07/20/17 elevated at 256. Haptoglobin 07/20/17 elevated at 357.  Vitamin B12 07/06/17 is WNL at 229.   On review of systems, pt reports decreased appetite, losing 20 lbs over 4-5 months, bilateral thigh pain, fatigue, mild leg swelling, and denies fevers, chills, night sweats, bone pains, nausea, abdominal pains, bleeding, blood in the urine, blood in the stools, black stools, changes in bowel habits, light headednss, dizziness, abdominal pains, noticing any new lumps or bumps, testicular pain or swelling, and any other symptoms.   On PMHx the pt reports heart attack and stroke in 2012. On Social Hx the pt denies much ETOH consumption.   Interval History:  Tony [name] today regarding his metastatic prostate cancer and myelopthisic anemia. We are joined by his daughter, Tony [name] The patient's last visit with [place] on 10/24/2019. The pt reports that he is doing well overall.  The pt reports that he has been well, except for his chronic knee pain. Pt believes that he has recieved steroid injections in his knee previously. Pt denies any issues taking Zytiga at this time.   Lab results today (12/16/19) of CBC w/diff and CMP is as follows: all values are WNL except for RBC at 3.07, Hgb at 10.0, HCT at 32.1, MCV at 104.6, nRBC at 0.5, Abs Immature Granulocytes at 0.24K, BUN at 25, Creatinine at 2.16, Total Protein  at 6.3, Albumin at 3.2, ALP at 243, GFR Est Af Am at 31.  On review of systems, pt reports knee pain, foot swelling and denies fevers, night sweats, chills, low appetite, unexpected weight loss, constipation, SOB, new bone pain, abdominal pain and any other symptoms.    MEDICAL HISTORY:  Past Medical History:  Diagnosis Date  . BPH associated with nocturia    flomax 0.4--> 0.8 mg trial. nocturia if has coffee. some incontinence  . CAD (coronary artery disease)    LAD Stent 2012. 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   . Gout    no rx.  apparently 1x in past  . History of stroke    no aspirin before stroke. no deficits. slurred words at time of stroke  . Hyperlipidemia   . Hypertension     SURGICAL HISTORY: Past Surgical History:  Procedure Laterality Date  . CARDIAC SURGERY     Stint  . CORONARY STENT PLACEMENT    . left arm fracture s/p surgery    . right leg fracture s.p surgery- screws like arm      SOCIAL HISTORY: Social History   Socioeconomic History  . Marital status: Married    Spouse name: Not on file  . Number of children: Not on file  . Years of education: Not on file  . Highest education level: Not on file  Occupational History  . Not on file  Tobacco Use  . Smoking status: Former Smoker    Packs/day: 0.50    Years: 1.00    Pack years: 0.50    Types: Cigarettes    Quit date: 05/01/1952    Years since quitting: 67.6  . Smokeless tobacco: Never Used  Vaping Use  . Vaping Use: Never used  Substance and Sexual Activity  . Alcohol use: No  . Drug use: No  . Sexual activity: Not on file  Other Topics Concern  . Not on file  Social History Narrative   Married- lives separate from wife. 2 children. Daughter passed from heart attack.       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 Difficulty of Paying Living Expenses:   Food Insecurity:   . Worried About Charity fundraiser in the Last Year:   . Arboriculturist in the Last Year:   Transportation Needs:   . Film/video editor (Medical):   Marland Kitchen Lack of Transportation (Non-Medical):   Physical Activity:   . Days of Exercise per Week:   . Minutes of Exercise per Session:   Stress:   . Feeling of Stress :   Social Connections:   . Frequency of Communication with Friends and Family:   . Frequency of Social Gatherings with Friends and Family:   . Attends Religious Services:   . Active Member of Clubs or Organizations:   . Attends English as a second language teacher Meetings:   Marland Kitchen Marital Status:   Intimate Partner Violence:   . Fear of Current or Ex-Partner:   . Emotionally Abused:   Marland Kitchen Physically Abused:   . Sexually Abused:     FAMILY HISTORY: Family History  Problem Relation Age of Onset  . Breast cancer Mother   . Heart disease Father   . Heart disease Brother        x2  . Prostate cancer Brother  ALLERGIES:  is allergic to gabapentin.  MEDICATIONS:  Current Outpatient Medications  Medication Sig Dispense Refill  . abiraterone acetate (ZYTIGA) 250 MG tablet TAKE 4 TABLETS (1,000 MG TOTAL) BY MOUTH DAILY. TAKE ON AN EMPTY STOMACH 1 HOUR BEFORE OR 2 HOURS AFTER A MEAL 120 tablet 0  . aspirin EC 81 MG tablet Take 1 tablet (81 mg total) by mouth daily. 90 tablet 3  . BIDIL 20-37.5 MG tablet TAKE 1 TABLET BY MOUTH TWICE DAILY 180 tablet 3  . calcium-vitamin D (OSCAL 500/200 D-3) 500-200 MG-UNIT tablet Take 1 tablet by mouth daily with breakfast. 30 tablet 2  . fentaNYL (DURAGESIC) 25 MCG/HR Place 1 patch onto the skin every 3 (three) days. 10 patch 0  . furosemide (LASIX) 20 MG tablet Take 1 tablet (20 mg total) by mouth daily as needed for fluid or edema. 30 tablet 3  . gabapentin (NEURONTIN) 100 MG capsule Take 2 capsules (200 mg total) by mouth at bedtime. (Patient not taking: Reported on 10/24/2019) 42 capsule 0  . metoprolol succinate (TOPROL-XL) 25 MG 24 hr tablet Take 1 tablet (25 mg total) by mouth daily. 90 tablet 3  . Multiple Vitamins-Minerals (MULTI COMPLETE PO) Take by mouth.    . nitroGLYCERIN (NITROSTAT) 0.4 MG SL tablet Place 1 tablet (0.4 mg total) under the tongue every 5 (five) minutes as needed for chest pain (3 maximum before seeking care). 30 tablet 0  . Omega-3 1000 MG CAPS Take 1 capsule by mouth daily.     Marland Kitchen oxyCODONE (OXY IR/ROXICODONE) 5 MG immediate release tablet Take 1 tablet (5 mg total) by mouth every 4 (four) hours as needed for severe pain. 90 tablet 0  . polyethylene glycol (MIRALAX) packet  Take 17 g by mouth daily. 30 each 1  . predniSONE (DELTASONE) 5 MG tablet Take 1 tablet (5 mg total) by mouth 2 (two) times daily with a meal. 180 tablet 2  . predniSONE (DELTASONE) 5 MG tablet 6 tab x 1 day, 5 tab x 1 day, 4 tab x 1 day, 3 tab x 1 day, 2 tab x 1 day, 1 tab x 1 day, stop (Patient not taking: Reported on 10/24/2019) 21 tablet 0  . senna-docusate (SENNA S) 8.6-50 MG tablet Take 2 tablets by mouth 2 (two) times daily. 120 tablet 2  . tamsulosin (FLOMAX) 0.4 MG CAPS capsule TAKE 1 CAPSULE BY MOUTH TWICE DAILY 180 capsule 3  . valACYclovir (VALTREX) 500 MG tablet Take 1 tablet (500 mg total) by mouth 3 (three) times daily. (Patient not taking: Reported on 10/24/2019) 21 tablet 0  . vitamin B-12 (CYANOCOBALAMIN) 1000 MCG tablet Take 1 tablet (1,000 mcg total) by mouth daily. 30 tablet 0  . Vitamin D, Ergocalciferol, (DRISDOL) 1.25 MG (50000 UNIT) CAPS capsule Take 1 capsule Monday, Wednesday and Friday. 36 capsule 3   No current facility-administered medications for this visit.    REVIEW OF SYSTEMS:   A 10+ POINT REVIEW OF SYSTEMS WAS OBTAINED including neurology, dermatology, psychiatry, cardiac, respiratory, lymph, extremities, GI, GU, Musculoskeletal, constitutional, breasts, reproductive, HEENT.  All pertinent positives are noted in the HPI.  All others are negative.   PHYSICAL EXAMINATION: ECOG FS:2 - Symptomatic, <50% confined to bed  Vitals:   12/16/19 1025  BP: (!) 151/84  Pulse: 88  Resp: 18  Temp: 97.7 F (36.5 C)  SpO2: 98%   Wt Readings from Last 3 Encounters:  12/16/19 189 lb 11.2 oz (86 kg)  10/24/19 185 lb 1.6 oz (84  kg)  09/23/19 181 lb 3.2 oz (82.2 kg)   Exam was given in a chair   GENERAL:alert, in no acute distress and comfortable SKIN: no acute rashes, no significant lesions EYES: conjunctiva are pink and non-injected, sclera anicteric OROPHARYNX: MMM, no exudates, no oropharyngeal erythema or ulceration NECK: supple, no JVD LYMPH:  no palpable  lymphadenopathy in the cervical, axillary or inguinal regions LUNGS: clear to auscultation b/l with normal respiratory effort HEART: regular rate & rhythm ABDOMEN:  normoactive bowel sounds , non tender, not distended. No palpable hepatosplenomegaly.  Extremity: 1+ pedal edema b/l PSYCH: alert & oriented x 3 with fluent speech NEURO: no focal motor/sensory deficits  LABORATORY DATA:  I have reviewed the data as listed  . CBC Latest Ref Rng & Units 12/16/2019 11/18/2019 10/24/2019  WBC 4.0 - 10.5 K/uL 7.4 6.5 6.6  Hemoglobin 13.0 - 17.0 g/dL 10.0(L) 10.5(L) 10.0(L)  Hematocrit 39 - 52 % 32.1(L) 33.7(L) 32.4(L)  Platelets 150 - 400 K/uL 178 170 175   . CBC    Component Value Date/Time   WBC 7.4 12/16/2019 1000   RBC 3.07 (L) 12/16/2019 1000   HGB 10.0 (L) 12/16/2019 1000   HGB 9.7 (L) 05/31/2018 1050   HCT 32.1 (L) 12/16/2019 1000   HCT 21.9 (L) 07/20/2017 2103   PLT 178 12/16/2019 1000   PLT 231 05/31/2018 1050   MCV 104.6 (H) 12/16/2019 1000   MCH 32.6 12/16/2019 1000   MCHC 31.2 12/16/2019 1000   RDW 14.3 12/16/2019 1000   LYMPHSABS 1.8 12/16/2019 1000   MONOABS 0.8 12/16/2019 1000   EOSABS 0.0 12/16/2019 1000   BASOSABS 0.0 12/16/2019 1000     . CMP Latest Ref Rng & Units 12/16/2019 11/18/2019 10/24/2019  Glucose 70 - 99 mg/dL 85 99 103(H)  BUN 8 - 23 mg/dL 25(H) 28(H) 27(H)  Creatinine 0.61 - 1.24 mg/dL 2.16(H) 2.20(H) 1.99(H)  Sodium 135 - 145 mmol/L 139 137 141  Potassium 3.5 - 5.1 mmol/L 4.4 4.0 4.3  Chloride 98 - 111 mmol/L 104 101 104  CO2 22 - 32 mmol/L '28 25 27  '$ Calcium 8.9 - 10.3 mg/dL 9.3 9.2 8.6(L)  Total Protein 6.5 - 8.1 g/dL 6.3(L) 6.2(L) 6.0(L)  Total Bilirubin 0.3 - 1.2 mg/dL 0.7 0.7 0.8  Alkaline Phos 38 - 126 U/L 243(H) 244(H) 224(H)  AST 15 - 41 U/L '16 18 17  '$ ALT 0 - 44 U/L '10 11 13    '$ Component     Latest Ref Rng & Units 07/30/2017 09/06/2017 10/04/2017 10/31/2017  Prostate Specific Ag, Serum     0.0 - 4.0 ng/mL 1,186.0 (H) 433.8 (H) 67.4 (H) 55.8  (H)   07/31/17 BM Bx:   RADIOGRAPHIC STUDIES: I have personally reviewed the radiological images as listed and agreed with the findings in the report. No results found.  ASSESSMENT & PLAN:   86 y.o. male with  1. Normocytic Anemia - due to primarily metastatic prostatic cancer (ACD + BM involvement with prostate cancer causing myelopthisic picture)  -Pt presented with a Hgb at 8.7 on 07/22/17, improved to 10.3 after receiving PRBC transfusion. -In review of the patient's previous CBC records, his anemia developed 6-7 months before presenting to care with me, unaccompanied by a drop in his EPO (07/20/17 elevated at 249.8);  LDH elevated  but haptoglobin at 357 on 07/20/17. -suggested against active hemolysis.  2. Recently diagnosed metastatic prostate cancer with extensive bone metastases. With bone mets/BM Mets and LNadenopathy. Exam positive for left retroperitoneal,  bilateral pelvis and right inguinal adenopathy. Exam positive for left retroperitoneal, bilateral pelvis and right inguinal adenopathy. Diffuse sclerotic bone metastasis.   3. Elevated alkaline phosphatase due to bone metastases from prostate cancer -CT BM bx -concerning for significant bone lesions in lower spine and pelvis PSA levels have declined to 26.9  4. Cancer related pain - primarily in b/l thighs. -much improved.  PLAN:  -Discussed pt labwork today, 12/16/19; blood counts and chemistries are stable, kidney function is unchanged. Will check PSA next month.  -The pt has no prohibitive toxicities from continuing 1000 mg Zytiga at this time. -Will continue Zytiga until clinically significant progression or intolerance. No indication to change treatment at this time.  -Advised pt that increasing pain medications to control knee pain could cause him to be sleepy and fatigued.  -Advised pt that steroids and Zytiga can cause foot swelling and fluid retention.  -Recommend pt elevate legs and stay as active as possible.  Pt has Lasix to use prn.  -Will repeat scans every 6 months. Will repeat PSA every 3-4 months.  -Will refer pt to PT and Orthopedics -Continue taking Vitamin D supplement  -Continue Xgeva q4weeks  -Continue Lupron q12weeks    FOLLOW UP: Referral to home care services for home PT Continue Vertell Novak with labs plz schedule next 4 doses -conitnue lupron q12weeks - plz schedule next 4 doses RTC with Dr [name] every 8 weeks with labs with every other dose of Xgeva x 2   The total time spent in the appt was 30 minutes and more than 50% was on counseling and direct patient cares.  All of the patient's questions were answered with apparent satisfaction. The patient knows to call the clinic with any problems, questions or concerns.    Sullivan Lone MD [facility] Hematology/Oncology Physician [facility]  (Office):       [phone] (Work cell):  [phone] (Fax):           [phone]  12/16/2019 5:04 PM   I, Yevette Edwards, am acting as a scribe for Dr. Sullivan Lone.   .I have reviewed the above documentation for accuracy and completeness, and I agree with the above. Brunetta Genera MD

## 2019-12-17 ENCOUNTER — Other Ambulatory Visit: Payer: Self-pay | Admitting: *Deleted

## 2019-12-17 DIAGNOSIS — G893 Neoplasm related pain (acute) (chronic): Secondary | ICD-10-CM

## 2019-12-17 DIAGNOSIS — C7951 Secondary malignant neoplasm of bone: Secondary | ICD-10-CM

## 2019-12-17 DIAGNOSIS — C61 Malignant neoplasm of prostate: Secondary | ICD-10-CM

## 2019-12-18 ENCOUNTER — Telehealth: Payer: Self-pay | Admitting: Family Medicine

## 2019-12-18 ENCOUNTER — Telehealth: Payer: Self-pay | Admitting: *Deleted

## 2019-12-18 MED FILL — BIDIL 20-37.5 MG TABS: 20-37.5 | 90 days supply | Qty: 180 | Fill #1

## 2019-12-18 NOTE — Progress Notes (Signed)
  Chronic Care Management   Outreach Note  12/18/2019 Name: Tony [name] MRN: [id number] DOB: [birth year]  Referred by: [name], MD Reason for referral : No chief complaint on file.   An unsuccessful telephone outreach was attempted today. The patient was referred to the pharmacist for assistance with care management and care coordination.   Follow Up Plan:   Earney Hamburg Upstream Scheduler

## 2019-12-18 NOTE — Telephone Encounter (Signed)
Lyanne Co [phone] at [facility] to f/u on DME order for pt a rollator, mattress and gel overlay, and bedside commode. Message was left to return call to Dr.Kale desk nurse and to f/u with pt daughter. Left daughter name and number as well.

## 2019-12-24 ENCOUNTER — Other Ambulatory Visit: Payer: Self-pay | Admitting: Hematology

## 2019-12-24 DIAGNOSIS — C61 Malignant neoplasm of prostate: Secondary | ICD-10-CM

## 2019-12-25 MED FILL — ABIRATERONE ACETATE 250 MG: 250 | 30 days supply | Qty: 120 | Fill #0

## 2019-12-30 ENCOUNTER — Other Ambulatory Visit: Payer: Self-pay | Admitting: *Deleted

## 2019-12-30 ENCOUNTER — Telehealth: Payer: Self-pay | Admitting: Family Medicine

## 2019-12-30 DIAGNOSIS — C61 Malignant neoplasm of prostate: Secondary | ICD-10-CM

## 2019-12-30 MED ORDER — FENTANYL 25 MCG/HR TD PT72: 1.0000 | MEDICATED_PATCH | TRANSDERMAL | 0 refills | Status: DC

## 2019-12-30 NOTE — Progress Notes (Signed)
  Chronic Care Management   Note  12/30/2019 Name: JABAREE MERCADO MRN: [id number] DOB: December 22, 1930  KIMBERLEY DASTRUP is a 86 y.o. year old male who is a primary care patient of [name], MD. I reached out to [name] by phone today in response to a referral sent by Mr. Kassie Mends Stansell's PCP, [name], MD.   Mr. Swiney was given information about Chronic Care Management services today including:  1. CCM service includes personalized support from designated clinical staff supervised by his physician, including individualized plan of care and coordination with other care providers 2. 24/7 contact phone numbers for assistance for urgent and routine care needs. 3. Service will only be billed when office clinical staff spend 20 minutes or more in a month to coordinate care. 4. Only one practitioner may furnish and bill the service in a calendar month. 5. The patient may stop CCM services at any time (effective at the end of the month) by phone call to the office staff.   Patient wishes to consider information provided and/or speak with a member of the care team before deciding about enrollment in care management services.   Follow up plan:   Earney Hamburg Upstream Scheduler

## 2019-12-30 NOTE — Telephone Encounter (Signed)
daughter called - patient needs fentanyl refill

## 2020-01-13 ENCOUNTER — Other Ambulatory Visit: Payer: Self-pay

## 2020-01-13 ENCOUNTER — Inpatient Hospital Stay: Payer: Medicare Other

## 2020-01-13 ENCOUNTER — Ambulatory Visit: Payer: Medicare Other

## 2020-01-13 ENCOUNTER — Other Ambulatory Visit: Payer: Medicare Other

## 2020-01-13 ENCOUNTER — Inpatient Hospital Stay: Payer: Medicare Other | Attending: Hematology

## 2020-01-13 VITALS — BP 146/48 | HR 82 | Temp 98.1°F | Resp 18

## 2020-01-13 DIAGNOSIS — C7951 Secondary malignant neoplasm of bone: Secondary | ICD-10-CM

## 2020-01-13 DIAGNOSIS — Z79899 Other long term (current) drug therapy: Secondary | ICD-10-CM | POA: Diagnosis not present

## 2020-01-13 DIAGNOSIS — C61 Malignant neoplasm of prostate: Secondary | ICD-10-CM

## 2020-01-13 DIAGNOSIS — C7651 Malignant neoplasm of right lower limb: Secondary | ICD-10-CM | POA: Insufficient documentation

## 2020-01-13 LAB — CBC WITH DIFFERENTIAL/PLATELET
Abs Immature Granulocytes: 0.19 10*3/uL — ABNORMAL HIGH (ref 0.00–0.07)
Basophils Absolute: 0 10*3/uL (ref 0.0–0.1)
Basophils Relative: 0 %
Eosinophils Absolute: 0 10*3/uL (ref 0.0–0.5)
Eosinophils Relative: 0 %
HCT: 28.1 % — ABNORMAL LOW (ref 39.0–52.0)
Hemoglobin: 8.8 g/dL — ABNORMAL LOW (ref 13.0–17.0)
Immature Granulocytes: 3 %
Lymphocytes Relative: 22 %
Lymphs Abs: 1.4 10*3/uL (ref 0.7–4.0)
MCH: 33.2 pg (ref 26.0–34.0)
MCHC: 31.3 g/dL (ref 30.0–36.0)
MCV: 106 fL — ABNORMAL HIGH (ref 80.0–100.0)
Monocytes Absolute: 0.7 10*3/uL (ref 0.1–1.0)
Monocytes Relative: 11 %
Neutro Abs: 4.1 10*3/uL (ref 1.7–7.7)
Neutrophils Relative %: 64 %
Platelets: 146 10*3/uL — ABNORMAL LOW (ref 150–400)
RBC: 2.65 MIL/uL — ABNORMAL LOW (ref 4.22–5.81)
RDW: 15.3 % (ref 11.5–15.5)
WBC: 6.4 10*3/uL (ref 4.0–10.5)
nRBC: 0.8 % — ABNORMAL HIGH (ref 0.0–0.2)

## 2020-01-13 LAB — VITAMIN D 25 HYDROXY (VIT D DEFICIENCY, FRACTURES): Vit D, 25-Hydroxy: 143.28 ng/mL — ABNORMAL HIGH (ref 30–100)

## 2020-01-13 LAB — CMP (CANCER CENTER ONLY)
ALT: 10 U/L (ref 0–44)
AST: 17 U/L (ref 15–41)
Albumin: 2.9 g/dL — ABNORMAL LOW (ref 3.5–5.0)
Alkaline Phosphatase: 237 U/L — ABNORMAL HIGH (ref 38–126)
Anion gap: 5 (ref 5–15)
BUN: 26 mg/dL — ABNORMAL HIGH (ref 8–23)
CO2: 29 mmol/L (ref 22–32)
Calcium: 8 mg/dL — ABNORMAL LOW (ref 8.9–10.3)
Chloride: 104 mmol/L (ref 98–111)
Creatinine: 2.08 mg/dL — ABNORMAL HIGH (ref 0.61–1.24)
GFR, Est AFR Am: 32 mL/min — ABNORMAL LOW (ref >60)
GFR, Estimated: 28 mL/min — ABNORMAL LOW (ref >60)
Glucose, Bld: 100 mg/dL — ABNORMAL HIGH (ref 70–99)
Potassium: 4.3 mmol/L (ref 3.5–5.1)
Sodium: 138 mmol/L (ref 135–145)
Total Bilirubin: 0.8 mg/dL (ref 0.3–1.2)
Total Protein: 5.9 g/dL — ABNORMAL LOW (ref 6.5–8.1)

## 2020-01-13 MED ORDER — DENOSUMAB 120 MG/1.7ML ~~LOC~~ SOLN
SUBCUTANEOUS | Status: AC
  Filled 2020-01-13: qty 1.7

## 2020-01-13 MED ORDER — LEUPROLIDE ACETATE (3 MONTH) 22.5 MG ~~LOC~~ KIT
22.5000 mg | PACK | Freq: Once | SUBCUTANEOUS | Status: AC
  Administered 2020-01-13: 22.5 mg via SUBCUTANEOUS

## 2020-01-13 MED ORDER — LEUPROLIDE ACETATE (3 MONTH) 22.5 MG ~~LOC~~ KIT
PACK | SUBCUTANEOUS | Status: AC
  Filled 2020-01-13: qty 22.5

## 2020-01-13 MED ORDER — DENOSUMAB 120 MG/1.7ML ~~LOC~~ SOLN
120.0000 mg | Freq: Once | SUBCUTANEOUS | Status: AC
  Administered 2020-01-13: 120 mg via SUBCUTANEOUS

## 2020-01-13 NOTE — Progress Notes (Signed)
Corr Ca ++ = 8.88 Ok to proceed w/ Xgeva today per Dr. [name].  Kennith Center, Pharm.D., CPP 01/13/2020@2 :43 PM

## 2020-01-13 NOTE — Patient Instructions (Signed)
Denosumab injection °What is this medicine? °DENOSUMAB (den oh sue mab) slows bone breakdown. Prolia is used to treat osteoporosis in women after menopause and in men, and in people who are taking corticosteroids for 6 months or more. Xgeva is used to treat a high calcium level due to cancer and to prevent bone fractures and other bone problems caused by multiple myeloma or cancer bone metastases. Xgeva is also used to treat giant cell tumor of the bone. °This medicine may be used for other purposes; ask your health care provider or pharmacist if you have questions. °COMMON BRAND NAME(S): Prolia, XGEVA °What should I tell my health care provider before I take this medicine? °They need to know if you have any of these conditions: °· dental disease °· having surgery or tooth extraction °· infection °· kidney disease °· low levels of calcium or Vitamin D in the blood °· malnutrition °· on hemodialysis °· skin conditions or sensitivity °· thyroid or parathyroid disease °· an unusual reaction to denosumab, other medicines, foods, dyes, or preservatives °· pregnant or trying to get pregnant °· breast-feeding °How should I use this medicine? °This medicine is for injection under the skin. It is given by a health care professional in a hospital or clinic setting. °A special MedGuide will be given to you before each treatment. Be sure to read this information carefully each time. °For Prolia, talk to your pediatrician regarding the use of this medicine in children. Special care may be needed. For Xgeva, talk to your pediatrician regarding the use of this medicine in children. While this drug may be prescribed for children as young as 13 years for selected conditions, precautions do apply. °Overdosage: If you think you have taken too much of this medicine contact a poison control center or emergency room at once. °NOTE: This medicine is only for you. Do not share this medicine with others. °What if I miss a dose? °It is  important not to miss your dose. Call your doctor or health care professional if you are unable to keep an appointment. °What may interact with this medicine? °Do not take this medicine with any of the following medications: °· other medicines containing denosumab °This medicine may also interact with the following medications: °· medicines that lower your chance of fighting infection °· steroid medicines like prednisone or cortisone °This list may not describe all possible interactions. Give your health care provider a list of all the medicines, herbs, non-prescription drugs, or dietary supplements you use. Also tell them if you smoke, drink alcohol, or use illegal drugs. Some items may interact with your medicine. °What should I watch for while using this medicine? °Visit your doctor or health care professional for regular checks on your progress. Your doctor or health care professional may order blood tests and other tests to see how you are doing. °Call your doctor or health care professional for advice if you get a fever, chills or sore throat, or other symptoms of a cold or flu. Do not treat yourself. This drug may decrease your body's ability to fight infection. Try to avoid being around people who are sick. °You should make sure you get enough calcium and vitamin D while you are taking this medicine, unless your doctor tells you not to. Discuss the foods you eat and the vitamins you take with your health care professional. °See your dentist regularly. Brush and floss your teeth as directed. Before you have any dental work done, tell your dentist you are   receiving this medicine. Do not become pregnant while taking this medicine or for 5 months after stopping it. Talk with your doctor or health care professional about your birth control options while taking this medicine. Women should inform their doctor if they wish to become pregnant or think they might be pregnant. There is a potential for serious side  effects to an unborn child. Talk to your health care professional or pharmacist for more information. What side effects may I notice from receiving this medicine? Side effects that you should report to your doctor or health care professional as soon as possible:  allergic reactions like skin rash, itching or hives, swelling of the face, lips, or tongue  bone pain  breathing problems  dizziness  jaw pain, especially after dental work  redness, blistering, peeling of the skin  signs and symptoms of infection like fever or chills; cough; sore throat; pain or trouble passing urine  signs of low calcium like fast heartbeat, muscle cramps or muscle pain; pain, tingling, numbness in the hands or feet; seizures  unusual bleeding or bruising  unusually weak or tired Side effects that usually do not require medical attention (report to your doctor or health care professional if they continue or are bothersome):  constipation  diarrhea  headache  joint pain  loss of appetite  muscle pain  runny nose  tiredness  upset stomach This list may not describe all possible side effects. Call your doctor for medical advice about side effects. You may report side effects to FDA at 1-800-FDA-1088. Where should I keep my medicine? This medicine is only given in a clinic, doctor's office, or other health care setting and will not be stored at home. NOTE: This sheet is a summary. It may not cover all possible information. If you have questions about this medicine, talk to your doctor, pharmacist, or health care provider.  2020 [employer]/Gold Standard (2017-08-24 16:10:44) Leuprolide injection What is this medicine? LEUPROLIDE (loo PROE lide) is a man-made hormone. It is used to treat the symptoms of prostate cancer. This medicine may also be used to treat children with early onset of puberty. It may be used for other hormonal conditions. This medicine may be used for other purposes; ask your  health care provider or pharmacist if you have questions. COMMON BRAND NAME(S): Lupron What should I tell my health care provider before I take this medicine? They need to know if you have any of these conditions:  diabetes  heart disease or previous heart attack  high blood pressure  high cholesterol  pain or difficulty passing urine  spinal cord metastasis  stroke  tobacco smoker  an unusual or allergic reaction to leuprolide, benzyl alcohol, other medicines, foods, dyes, or preservatives  pregnant or trying to get pregnant  breast-feeding How should I use this medicine? This medicine is for injection under the skin or into a muscle. You will be taught how to prepare and give this medicine. Use exactly as directed. Take your medicine at regular intervals. Do not take your medicine more often than directed. It is important that you put your used needles and syringes in a special sharps container. Do not put them in a trash can. If you do not have a sharps container, call your pharmacist or healthcare provider to get one. A special MedGuide will be given to you by the pharmacist with each prescription and refill. Be sure to read this information carefully each time. Talk to your pediatrician regarding the use of   this medicine in children. While this medicine may be prescribed for children as young as 8 years for selected conditions, precautions do apply. Overdosage: If you think you have taken too much of this medicine contact a poison control center or emergency room at once. NOTE: This medicine is only for you. Do not share this medicine with others. What if I miss a dose? If you miss a dose, take it as soon as you can. If it is almost time for your next dose, take only that dose. Do not take double or extra doses. What may interact with this medicine? Do not take this medicine with any of the following medications:  chasteberry This medicine may also interact with the  following medications:  herbal or dietary supplements, like black cohosh or DHEA  male hormones, like estrogens or progestins and birth control pills, patches, rings, or injections  male hormones, like testosterone This list may not describe all possible interactions. Give your health care provider a list of all the medicines, herbs, non-prescription drugs, or dietary supplements you use. Also tell them if you smoke, drink alcohol, or use illegal drugs. Some items may interact with your medicine. What should I watch for while using this medicine? Visit your doctor or health care professional for regular checks on your progress. During the first week, your symptoms may get worse, but then will improve as you continue your treatment. You may get hot flashes, increased bone pain, increased difficulty passing urine, or an aggravation of nerve symptoms. Discuss these effects with your doctor or health care professional, some of them may improve with continued use of this medicine. Male patients may experience a menstrual cycle or spotting during the first 2 months of therapy with this medicine. If this continues, contact your doctor or health care professional. This medicine may increase blood sugar. Ask your healthcare provider if changes in diet or medicines are needed if you have diabetes. What side effects may I notice from receiving this medicine? Side effects that you should report to your doctor or health care professional as soon as possible:  allergic reactions like skin rash, itching or hives, swelling of the face, lips, or tongue  breathing problems  chest pain  depression or memory disorders  pain in your legs or groin  pain at site where injected  severe headache  signs and symptoms of high blood sugar such as being more thirsty or hungry or having to urinate more than normal. You may also feel very tired or have blurry vision  swelling of the feet and legs  visual  changes  vomiting Side effects that usually do not require medical attention (report to your doctor or health care professional if they continue or are bothersome):  breast swelling or tenderness  decrease in sex drive or performance  diarrhea  hot flashes  loss of appetite  muscle, joint, or bone pains  nausea  redness or irritation at site where injected  skin problems or acne This list may not describe all possible side effects. Call your doctor for medical advice about side effects. You may report side effects to FDA at 1-800-FDA-1088. Where should I keep my medicine? Keep out of the reach of children. Store below 25 degrees C (77 degrees F). Do not freeze. Protect from light. Do not use if it is not clear or if there are particles present. Throw away any unused medicine after the expiration date. NOTE: This sheet is a summary. It may not cover all   possible information. If you have questions about this medicine, talk to your doctor, pharmacist, or health care provider.  2020 [employer]/Gold Standard (2018-02-14 09:52:48)  

## 2020-01-14 LAB — PSA, TOTAL AND FREE
PSA, Free Pct: 12.9 %
PSA, Free: 48 ng/mL
Prostate Specific Ag, Serum: 371 ng/mL — ABNORMAL HIGH (ref 0.0–4.0)

## 2020-01-16 ENCOUNTER — Ambulatory Visit: Payer: Medicare Other | Admitting: Family Medicine

## 2020-01-23 ENCOUNTER — Other Ambulatory Visit: Payer: Self-pay | Admitting: Hematology

## 2020-01-23 DIAGNOSIS — C61 Malignant neoplasm of prostate: Secondary | ICD-10-CM

## 2020-01-23 MED FILL — ABIRATERONE ACETATE 250 MG: 250 | 30 days supply | Qty: 120 | Fill #0

## 2020-01-25 ENCOUNTER — Inpatient Hospital Stay (HOSPITAL_COMMUNITY)
Admission: EM | Admit: 2020-01-25 | Discharge: 2020-01-31 | DRG: 291 | Disposition: A | Discharge status: Home Health | Payer: Medicare Other | Attending: Student | Admitting: Student

## 2020-01-25 ENCOUNTER — Encounter (HOSPITAL_COMMUNITY): Payer: Self-pay

## 2020-01-25 ENCOUNTER — Emergency Department (HOSPITAL_COMMUNITY): Payer: Medicare Other

## 2020-01-25 ENCOUNTER — Other Ambulatory Visit: Payer: Self-pay

## 2020-01-25 DIAGNOSIS — E1122 Type 2 diabetes mellitus with diabetic chronic kidney disease: Secondary | ICD-10-CM | POA: Diagnosis present

## 2020-01-25 DIAGNOSIS — N401 Enlarged prostate with lower urinary tract symptoms: Secondary | ICD-10-CM | POA: Diagnosis present

## 2020-01-25 DIAGNOSIS — Z20822 Contact with and (suspected) exposure to covid-19: Secondary | ICD-10-CM | POA: Diagnosis present

## 2020-01-25 DIAGNOSIS — Z8673 Personal history of transient ischemic attack (TIA), and cerebral infarction without residual deficits: Secondary | ICD-10-CM

## 2020-01-25 DIAGNOSIS — I442 Atrioventricular block, complete: Secondary | ICD-10-CM | POA: Diagnosis not present

## 2020-01-25 DIAGNOSIS — E785 Hyperlipidemia, unspecified: Secondary | ICD-10-CM | POA: Diagnosis not present

## 2020-01-25 DIAGNOSIS — I248 Other forms of acute ischemic heart disease: Secondary | ICD-10-CM | POA: Diagnosis not present

## 2020-01-25 DIAGNOSIS — I5041 Acute combined systolic (congestive) and diastolic (congestive) heart failure: Secondary | ICD-10-CM | POA: Diagnosis not present

## 2020-01-25 DIAGNOSIS — I130 Hypertensive heart and chronic kidney disease with heart failure and stage 1 through stage 4 chronic kidney disease, or unspecified chronic kidney disease: Secondary | ICD-10-CM | POA: Diagnosis not present

## 2020-01-25 DIAGNOSIS — I420 Dilated cardiomyopathy: Secondary | ICD-10-CM | POA: Diagnosis not present

## 2020-01-25 DIAGNOSIS — Z955 Presence of coronary angioplasty implant and graft: Secondary | ICD-10-CM

## 2020-01-25 DIAGNOSIS — R609 Edema, unspecified: Secondary | ICD-10-CM

## 2020-01-25 DIAGNOSIS — Z66 Do not resuscitate: Secondary | ICD-10-CM | POA: Diagnosis not present

## 2020-01-25 DIAGNOSIS — I10 Essential (primary) hypertension: Secondary | ICD-10-CM | POA: Diagnosis not present

## 2020-01-25 DIAGNOSIS — R0609 Other forms of dyspnea: Secondary | ICD-10-CM

## 2020-01-25 DIAGNOSIS — R351 Nocturia: Secondary | ICD-10-CM | POA: Diagnosis present

## 2020-01-25 DIAGNOSIS — Z803 Family history of malignant neoplasm of breast: Secondary | ICD-10-CM

## 2020-01-25 DIAGNOSIS — C7951 Secondary malignant neoplasm of bone: Secondary | ICD-10-CM | POA: Diagnosis present

## 2020-01-25 DIAGNOSIS — N184 Chronic kidney disease, stage 4 (severe): Secondary | ICD-10-CM | POA: Diagnosis not present

## 2020-01-25 DIAGNOSIS — B9620 Unspecified Escherichia coli [E. coli] as the cause of diseases classified elsewhere: Secondary | ICD-10-CM | POA: Diagnosis present

## 2020-01-25 DIAGNOSIS — I472 Ventricular tachycardia: Secondary | ICD-10-CM | POA: Diagnosis not present

## 2020-01-25 DIAGNOSIS — R778 Other specified abnormalities of plasma proteins: Secondary | ICD-10-CM | POA: Diagnosis not present

## 2020-01-25 DIAGNOSIS — Z87891 Personal history of nicotine dependence: Secondary | ICD-10-CM

## 2020-01-25 DIAGNOSIS — R5381 Other malaise: Secondary | ICD-10-CM | POA: Diagnosis not present

## 2020-01-25 DIAGNOSIS — I252 Old myocardial infarction: Secondary | ICD-10-CM

## 2020-01-25 DIAGNOSIS — I517 Cardiomegaly: Secondary | ICD-10-CM | POA: Diagnosis not present

## 2020-01-25 DIAGNOSIS — R600 Localized edema: Secondary | ICD-10-CM | POA: Diagnosis not present

## 2020-01-25 DIAGNOSIS — I5020 Unspecified systolic (congestive) heart failure: Secondary | ICD-10-CM

## 2020-01-25 DIAGNOSIS — I5021 Acute systolic (congestive) heart failure: Secondary | ICD-10-CM | POA: Diagnosis not present

## 2020-01-25 DIAGNOSIS — Z8546 Personal history of malignant neoplasm of prostate: Secondary | ICD-10-CM

## 2020-01-25 DIAGNOSIS — C61 Malignant neoplasm of prostate: Secondary | ICD-10-CM | POA: Diagnosis present

## 2020-01-25 DIAGNOSIS — M170 Bilateral primary osteoarthritis of knee: Secondary | ICD-10-CM | POA: Diagnosis not present

## 2020-01-25 DIAGNOSIS — Z7189 Other specified counseling: Secondary | ICD-10-CM

## 2020-01-25 DIAGNOSIS — N1832 Chronic kidney disease, stage 3b: Secondary | ICD-10-CM | POA: Diagnosis not present

## 2020-01-25 DIAGNOSIS — Z7952 Long term (current) use of systemic steroids: Secondary | ICD-10-CM

## 2020-01-25 DIAGNOSIS — M1990 Unspecified osteoarthritis, unspecified site: Secondary | ICD-10-CM | POA: Diagnosis not present

## 2020-01-25 DIAGNOSIS — R0602 Shortness of breath: Secondary | ICD-10-CM | POA: Diagnosis not present

## 2020-01-25 DIAGNOSIS — Z9109 Other allergy status, other than to drugs and biological substances: Secondary | ICD-10-CM

## 2020-01-25 DIAGNOSIS — Z79899 Other long term (current) drug therapy: Secondary | ICD-10-CM

## 2020-01-25 DIAGNOSIS — I2510 Atherosclerotic heart disease of native coronary artery without angina pectoris: Secondary | ICD-10-CM | POA: Diagnosis not present

## 2020-01-25 DIAGNOSIS — Z8042 Family history of malignant neoplasm of prostate: Secondary | ICD-10-CM

## 2020-01-25 DIAGNOSIS — D631 Anemia in chronic kidney disease: Secondary | ICD-10-CM | POA: Diagnosis not present

## 2020-01-25 DIAGNOSIS — N390 Urinary tract infection, site not specified: Secondary | ICD-10-CM | POA: Diagnosis not present

## 2020-01-25 DIAGNOSIS — M7989 Other specified soft tissue disorders: Secondary | ICD-10-CM | POA: Diagnosis not present

## 2020-01-25 DIAGNOSIS — Z8249 Family history of ischemic heart disease and other diseases of the circulatory system: Secondary | ICD-10-CM

## 2020-01-25 DIAGNOSIS — Z515 Encounter for palliative care: Secondary | ICD-10-CM

## 2020-01-25 DIAGNOSIS — I5023 Acute on chronic systolic (congestive) heart failure: Secondary | ICD-10-CM | POA: Diagnosis not present

## 2020-01-25 DIAGNOSIS — D638 Anemia in other chronic diseases classified elsewhere: Secondary | ICD-10-CM | POA: Diagnosis not present

## 2020-01-25 DIAGNOSIS — I509 Heart failure, unspecified: Secondary | ICD-10-CM | POA: Diagnosis not present

## 2020-01-25 DIAGNOSIS — Z7982 Long term (current) use of aspirin: Secondary | ICD-10-CM

## 2020-01-25 LAB — URINALYSIS, ROUTINE W REFLEX MICROSCOPIC
Bilirubin Urine: NEGATIVE
Glucose, UA: NEGATIVE mg/dL
Ketones, ur: NEGATIVE mg/dL
Nitrite: NEGATIVE
Protein, ur: NEGATIVE mg/dL
RBC / HPF: >50 RBC/hpf — ABNORMAL HIGH (ref 0–5)
Specific Gravity, Urine: 1.018 (ref 1.005–1.030)
WBC, UA: >50 WBC/hpf — ABNORMAL HIGH (ref 0–5)
pH: 5 (ref 5.0–8.0)

## 2020-01-25 LAB — CBC
HCT: 31.3 % — ABNORMAL LOW (ref 39.0–52.0)
Hemoglobin: 9.6 g/dL — ABNORMAL LOW (ref 13.0–17.0)
MCH: 32.8 pg (ref 26.0–34.0)
MCHC: 30.7 g/dL (ref 30.0–36.0)
MCV: 106.8 fL — ABNORMAL HIGH (ref 80.0–100.0)
Platelets: 152 10*3/uL (ref 150–400)
RBC: 2.93 MIL/uL — ABNORMAL LOW (ref 4.22–5.81)
RDW: 15.6 % — ABNORMAL HIGH (ref 11.5–15.5)
WBC: 5.7 10*3/uL (ref 4.0–10.5)
nRBC: 1.2 % — ABNORMAL HIGH (ref 0.0–0.2)

## 2020-01-25 LAB — TROPONIN I (HIGH SENSITIVITY)
Troponin I (High Sensitivity): 22 ng/L — ABNORMAL HIGH (ref <18)
Troponin I (High Sensitivity): 25 ng/L — ABNORMAL HIGH (ref <18)

## 2020-01-25 LAB — BASIC METABOLIC PANEL
Anion gap: 9 (ref 5–15)
BUN: 31 mg/dL — ABNORMAL HIGH (ref 8–23)
CO2: 28 mmol/L (ref 22–32)
Calcium: 7.8 mg/dL — ABNORMAL LOW (ref 8.9–10.3)
Chloride: 104 mmol/L (ref 98–111)
Creatinine, Ser: 2 mg/dL — ABNORMAL HIGH (ref 0.61–1.24)
GFR calc Af Amer: 34 mL/min — ABNORMAL LOW (ref >60)
GFR calc non Af Amer: 29 mL/min — ABNORMAL LOW (ref >60)
Glucose, Bld: 119 mg/dL — ABNORMAL HIGH (ref 70–99)
Potassium: 4.3 mmol/L (ref 3.5–5.1)
Sodium: 141 mmol/L (ref 135–145)

## 2020-01-25 LAB — HEPATIC FUNCTION PANEL
ALT: 13 U/L (ref 0–44)
AST: 23 U/L (ref 15–41)
Albumin: 3.3 g/dL — ABNORMAL LOW (ref 3.5–5.0)
Alkaline Phosphatase: 188 U/L — ABNORMAL HIGH (ref 38–126)
Bilirubin, Direct: 0.1 mg/dL (ref 0.0–0.2)
Indirect Bilirubin: 0.8 mg/dL (ref 0.3–0.9)
Total Bilirubin: 0.9 mg/dL (ref 0.3–1.2)
Total Protein: 6.1 g/dL — ABNORMAL LOW (ref 6.5–8.1)

## 2020-01-25 LAB — RESPIRATORY PANEL BY RT PCR (FLU A&B, COVID)
Influenza A by PCR: NEGATIVE
Influenza B by PCR: NEGATIVE
SARS Coronavirus 2 by RT PCR: NEGATIVE

## 2020-01-25 LAB — BRAIN NATRIURETIC PEPTIDE: B Natriuretic Peptide: 662 pg/mL — ABNORMAL HIGH (ref 0.0–100.0)

## 2020-01-25 MED ORDER — SODIUM CHLORIDE 0.9 % IV SOLN
1.0000 g | Freq: Once | INTRAVENOUS | Status: AC
  Administered 2020-01-25: 1 g via INTRAVENOUS
  Filled 2020-01-25: qty 10

## 2020-01-25 MED ORDER — FUROSEMIDE 10 MG/ML IJ SOLN
20.0000 mg | Freq: Once | INTRAMUSCULAR | Status: AC
  Administered 2020-01-25: 20 mg via INTRAVENOUS
  Filled 2020-01-25: qty 4

## 2020-01-25 NOTE — ED Triage Notes (Signed)
Pt coming in c/o bilateral feet and leg swelling x2 days. New shob with exertion. Has lasix prn and taken last 2 day. Cancer pt

## 2020-01-25 NOTE — ED Notes (Signed)
Patient wife in room

## 2020-01-25 NOTE — ED Provider Notes (Signed)
[facility] DEPT Provider Note   CSN: [id number] Arrival date & time: 01/25/20  1534     History Chief Complaint  Patient presents with  . Leg Swelling    BRAELYNN LUPTON is a 86 y.o. male.  MARRIS FRONTERA is a 86 y.o. male with a history of metastatic prostate cancer, hypertension, hyperlipidemia, CAD s/p stent in 2012, stroke, CKD, who presents to the emergency department for evaluation of lower extremity swelling and dyspnea on exertion.  Patient states that he has always had some mild swelling in his feet and ankles, but over the past 2 to 3 days he has developed significantly worsening swelling all the way up his shins that has made it difficult for him to walk and get around.  He also reports new dyspnea on exertion that is made it difficult for him to do his typical activities.  He denies any associated chest pain.  States that he is prescribed Lasix as needed for lower extremity swelling and had not been using this until swelling got worse over the past 2 days, he has had increased urination with taking this medication but has not noticed improvement in swelling.  Denies any redness, warmth or wounds to the legs, states swelling is nonpainful.  No other medication changes.  Patient is currently on Zytiga for hormone chemotherapy and is followed by Dr. [name] with oncology.  Denies any fevers, cough, abdominal pain, nausea, vomiting or dysuria.        Past Medical History:  Diagnosis Date  . BPH associated with nocturia    flomax 0.4--> 0.8 mg trial. nocturia if has coffee. some incontinence  . CAD (coronary artery disease)    LAD Stent 2012. 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   . Gout    no rx. apparently 1x in past  . History of stroke    no aspirin before stroke. no deficits. slurred words at time of stroke  . Hyperlipidemia   . Hypertension     Patient Active Problem List   Diagnosis Date Noted  . Osteoarthritis of  knee 07/16/2019  . Malignant neoplasm of prostate ([place]) 08/07/2017  . Macrocytic anemia 07/20/2017  . AKI (acute kidney injury) ([place]) 07/20/2017  . Thrombocytopenia ([place]) 07/20/2017  . Claudication of both lower extremities ([place]) 07/20/2017  . Anemia   . History of complete AV block 10/30/2016  . Venous insufficiency 10/30/2016  . CKD (chronic kidney disease), stage III ([place]) 10/30/2016  . Hypertension   . Hyperlipidemia   . BPH associated with nocturia   . CAD (coronary artery disease). MI in 2012 with stent   . History of stroke   . Gout     Past Surgical History:  Procedure Laterality Date  . CARDIAC SURGERY     Stint  . CORONARY STENT PLACEMENT    . left arm fracture s/p surgery    . right leg fracture s.p surgery- screws like arm         Family History  Problem Relation Age of Onset  . Breast cancer Mother   . Heart disease Father   . Heart disease Brother        x2  . Prostate cancer Brother     Social History   Tobacco Use  . Smoking status: Former Smoker    Packs/day: 0.50    Years: 1.00    Pack years: 0.50    Types: Cigarettes    Quit date: 05/01/1952  Years since quitting: 67.7  . Smokeless tobacco: Never Used  Vaping Use  . Vaping Use: Never used  Substance Use Topics  . Alcohol use: No  . Drug use: No    Home Medications Prior to Admission medications   Medication Sig Start Date End Date Taking? Authorizing Provider  abiraterone acetate (ZYTIGA) 250 MG tablet TAKE 4 TABLETS (1,000 MG TOTAL) BY MOUTH DAILY. TAKE ON AN EMPTY STOMACH 1 HOUR BEFORE OR 2 HOURS AFTER A MEAL 01/23/20   Brunetta Genera, MD  aspirin EC 81 MG tablet Take 1 tablet (81 mg total) by mouth daily. 01/29/17   [name], MD  BIDIL 20-37.5 MG tablet TAKE 1 TABLET BY MOUTH TWICE DAILY 05/16/19   [name], MD  calcium-vitamin D (OSCAL 500/200 D-3) 500-200 MG-UNIT tablet Take 1 tablet by mouth daily with breakfast. 08/09/17   Brunetta Genera, MD  fentaNYL  (DURAGESIC) 25 MCG/HR Place 1 patch onto the skin every 3 (three) days. 12/30/19   Brunetta Genera, MD  furosemide (LASIX) 20 MG tablet Take 1 tablet (20 mg total) by mouth daily as needed for fluid or edema. 07/16/19   [name], MD  gabapentin (NEURONTIN) 100 MG capsule Take 2 capsules (200 mg total) by mouth at bedtime. Patient not taking: Reported on 10/24/2019 09/23/19   Sandi Mealy E., PA-C  metoprolol succinate (TOPROL-XL) 25 MG 24 hr tablet Take 1 tablet (25 mg total) by mouth daily. 07/16/19   [name], MD  Multiple Vitamins-Minerals [facility] COMPLETE PO) Take by mouth.    [provider]  nitroGLYCERIN (NITROSTAT) 0.4 MG SL tablet Place 1 tablet (0.4 mg total) under the tongue every 5 (five) minutes as needed for chest pain (3 maximum before seeking care). 11/17/16   Marin Olp, MD  Omega-3 1000 MG CAPS Take 1 capsule by mouth daily.     [provider]  oxyCODONE (OXY IR/ROXICODONE) 5 MG immediate release tablet Take 1 tablet (5 mg total) by mouth every 4 (four) hours as needed for severe pain. 10/17/19   Brunetta Genera, MD  polyethylene glycol [facility]) packet Take 17 g by mouth daily. 09/06/17   Brunetta Genera, MD  predniSONE (DELTASONE) 5 MG tablet Take 1 tablet (5 mg total) by mouth 2 (two) times daily with a meal. 05/23/19 02/17/20  Brunetta Genera, MD  predniSONE (DELTASONE) 5 MG tablet 6 tab x 1 day, 5 tab x 1 day, 4 tab x 1 day, 3 tab x 1 day, 2 tab x 1 day, 1 tab x 1 day, stop Patient not taking: Reported on 10/24/2019 09/23/19   Harle Stanford., PA-C  senna-docusate (SENNA S) 8.6-50 MG tablet Take 2 tablets by mouth 2 (two) times daily. 04/17/19   Brunetta Genera, MD  tamsulosin (FLOMAX) 0.4 MG CAPS capsule TAKE 1 CAPSULE BY MOUTH TWICE DAILY 06/17/19   [name], MD  valACYclovir (VALTREX) 500 MG tablet Take 1 tablet (500 mg total) by mouth 3 (three) times daily. Patient not taking: Reported on 10/24/2019 09/23/19   Harle Stanford., PA-C  vitamin B-12 (CYANOCOBALAMIN) 1000 MCG tablet Take 1 tablet (1,000 mcg total) by mouth daily. 07/22/17   Cristal Ford, DO  Vitamin D, Ergocalciferol, (DRISDOL) 1.25 MG (50000 UNIT) CAPS capsule Take 1 capsule Monday, Wednesday and Friday. 07/04/19   Brunetta Genera, MD    Allergies    Gabapentin  Review of Systems   Review of Systems  Constitutional: Negative for chills and fever.  Respiratory: Positive for shortness of breath. Negative for cough.   Cardiovascular: Positive for leg swelling. Negative for chest pain.  Gastrointestinal: Negative for abdominal pain, nausea and vomiting.  Genitourinary: Negative for dysuria.  Musculoskeletal: Negative for arthralgias and myalgias.  Skin: Negative for color change and rash.  Neurological: Negative for dizziness, weakness, light-headedness and numbness.  All other systems reviewed and are negative.   Physical Exam Updated Vital Signs BP (!) 147/79   Pulse 84   Temp 98.9 F (37.2 C) (Oral)   Resp 18   Ht _0  (1.702 m)   Wt 85.7 kg   SpO2 96%   BMI 29.60 kg/m   Physical Exam Vitals and nursing note reviewed.  Constitutional:      General: He is not in acute distress.    Appearance: Normal appearance. He is well-developed. He is not diaphoretic.     Comments: Elderly male, chronically ill-appearing but in no acute distress  HENT:     Head: Normocephalic and atraumatic.     Mouth/Throat:     Mouth: Mucous membranes are moist.     Pharynx: Oropharynx is clear.  Eyes:     General:        Right eye: No discharge.        Left eye: No discharge.  Cardiovascular:     Rate and Rhythm: Normal rate and regular rhythm.     Heart sounds: Normal heart sounds. No murmur heard.  No friction rub. No gallop.   Pulmonary:     Effort: Pulmonary effort is normal. No respiratory distress.     Breath sounds: Rales present. No wheezing.     Comments: Respirations equal and unlabored, able to speak in full sentences, on  auscultation patient does have some faint crackles in bilateral bases, but no wheezing or rhonchi, good air movement bilaterally Abdominal:     General: Bowel sounds are normal. There is no distension.     Palpations: Abdomen is soft. There is no mass.     Tenderness: There is no abdominal tenderness. There is no guarding.     Comments: Abdomen soft, nondistended, nontender to palpation in all quadrants without guarding or peritoneal signs   Musculoskeletal:        General: No deformity.     Cervical back: Neck supple.     Right lower leg: Edema present.     Left lower leg: Edema present.     Comments: Patient with pitting edema up to the top of the shin bilaterally, no erythema or warmth, legs are nontender.  Skin:    General: Skin is warm and dry.     Capillary Refill: Capillary refill takes less than 2 seconds.  Neurological:     Mental Status: He is alert.     Coordination: Coordination normal.     Comments: Speech is clear, able to follow commands Moves extremities without ataxia, coordination intact  Psychiatric:        Mood and Affect: Mood normal.        Behavior: Behavior normal.     ED Results / Procedures / Treatments   Labs (all labs ordered are listed, but only abnormal results are displayed) Labs Reviewed  URINALYSIS, ROUTINE W REFLEX MICROSCOPIC - Abnormal; Notable for the following components:      Result Value   APPearance HAZY (*)    Hgb urine dipstick SMALL (*)    Leukocytes,Ua MODERATE (*)    RBC /  HPF >50 (*)    WBC, UA >50 (*)    Bacteria, UA MANY (*)    All other components within normal limits  BASIC METABOLIC PANEL - Abnormal; Notable for the following components:   Glucose, Bld 119 (*)    BUN 31 (*)    Creatinine, Ser 2.00 (*)    Calcium 7.8 (*)    GFR calc non Af Amer 29 (*)    GFR calc Af Amer 34 (*)    All other components within normal limits  CBC - Abnormal; Notable for the following components:   RBC 2.93 (*)    Hemoglobin 9.6 (*)     HCT 31.3 (*)    MCV 106.8 (*)    RDW 15.6 (*)    nRBC 1.2 (*)    All other components within normal limits  BRAIN NATRIURETIC PEPTIDE - Abnormal; Notable for the following components:   B Natriuretic Peptide 662.0 (*)    All other components within normal limits  HEPATIC FUNCTION PANEL - Abnormal; Notable for the following components:   Total Protein 6.1 (*)    Albumin 3.3 (*)    Alkaline Phosphatase 188 (*)    All other components within normal limits  TROPONIN I (HIGH SENSITIVITY) - Abnormal; Notable for the following components:   Troponin I (High Sensitivity) 22 (*)    All other components within normal limits  TROPONIN I (HIGH SENSITIVITY) - Abnormal; Notable for the following components:   Troponin I (High Sensitivity) 25 (*)    All other components within normal limits  URINE CULTURE  RESPIRATORY PANEL BY RT PCR (FLU A&B, COVID)    EKG EKG Interpretation  Date/Time:  Sunday January 25 2020 17:34:52 EDT Ventricular Rate:  88 PR Interval:    QRS Duration: 99 QT Interval:  356 QTC Calculation: 431 R Axis:   32 Text Interpretation: Sinus rhythm Low voltage, precordial leads Nonspecific T abnormalities, diffuse leads No STEMI Confirmed by Octaviano Glow [phone]) on 01/25/2020 7:26:54 PM   Radiology DG Chest 2 View  Result Date: 01/25/2020 CLINICAL DATA:  Patient with bilateral feet and leg swelling. EXAM: CHEST - 2 VIEW COMPARISON:  Chest radiograph January 25, 2020 FINDINGS: Monitoring leads overlie the patient. Stable cardiomegaly. No large area pulmonary consolidation. No pleural effusion or pneumothorax. Patchy sclerotic osseous changes throughout the axial and appendicular skeleton compatible with history of metastatic prostate cancer. IMPRESSION: Cardiomegaly. No acute cardiopulmonary process. Electronically Signed   By: Lovey Newcomer M.D.   On: 01/25/2020 18:49    Procedures Procedures (including critical care time)  Medications Ordered in ED Medications    cefTRIAXone (ROCEPHIN) 1 g in sodium chloride 0.9 % 100 mL IVPB (1 g Intravenous New Bag/Given 01/25/20 2011)  furosemide (LASIX) injection 20 mg (20 mg Intravenous Given 01/25/20 2008)    ED Course  I have reviewed the triage vital signs and the nursing notes.  Pertinent labs & imaging results that were available during my care of the patient were reviewed by me and considered in my medical decision making (see chart for details).  Clinical Course as of Jan 25 2015  Nancy Fetter Jan 25, 2020  1941 This is an 86 year old male presenting to emergency department with bilateral leg swelling and dyspnea.  He reports abrupt onset of his symptoms within the past 2 days.  Describes a generalized sense of weakness and feels like he cannot do basic things, even walking with his walker.  His daughter at bedside agrees that he is significantly  weaker today.  On exam he does have fine crackles in his pulmonary exam, he is not hypoxic and speaking full sentences, but does have significant bilateral symmetrical pitting edema.  Clinically this concerning for new onset of congestive heart failure.  There is no prior history of this.  With a troponin of 22 and no ischemic changes on EKG, doubt this is ACS.  His UA is positive likely for infection, which may be a source.  We will treat with ceftriaxone.  He is also on several chemo medications, and these may also be triggering congestive heart failure.  I have a lower suspicion for PE without persistent tachycardia, tachypnea, hypoxia or active chest pain or shortness of breath.  This remains a high clinical suspicion, inpatient team attended VQ scan.   [MT]    Clinical Course User Index [MT] Trifan, Carola Rhine, MD   MDM Rules/Calculators/A&P                          87 year old male presents with lower extremity swelling and dyspnea on exertion.  He is always had mild edema but it significantly worsened over the past 2 days and patient developed dyspnea.  He is not  hypoxic, vitals are normal and patient is in no acute distress.  He denies any associated chest pain.  Has a history of prior MI but has never been diagnosed with heart failure.  Is currently on hormone chemotherapy with Zytiga which can cause some lower extremity edema, but he has not had any recent dose change to this medication and had acute worsening of symptoms.  Concern for potential new onset heart failure.  Basic labs ordered, urinalysis, BNP, troponin, chest x-ray and EKG.  I have independently ordered, reviewed and interpreted all labs and imaging: EKG shows sinus rhythm with nonspecific T wave abnormalities, no STEMI, no significant change Chest x-ray shows cardiomegaly with no evidence of pulmonary edema or other acute cardiopulmonary disease. CBC: No leukocytosis, hemoglobin at baseline BMP: Creatinine at baseline at 2.0, calcium of 7.8, glucose 119, no other significant electrolyte derangements. LFTs: Alk phos of 188 which is similar to prior, no transaminitis BNP: Elevated at 662, no previous available for comparison Troponin: Initial 22, likely in the setting of heart failure, patient without chest pain, EKG without ischemic changes UA: Leukocytes with many bacteria, WBCs and RBCs present, will send for culture and treat for UTI  Concern for potential heart failure given dyspnea on exertion and significant lower extremity edema.  Patient is not hypoxic, tachypneic or tachycardic, but with clear chest x-ray and active cancer there is still concern for potential PE, due to patient's creatinine he is not a candidate for CT angio study.  In the meantime patient given 20 of IV Lasix.  Will consult for medicine admission.  Case discussed with Dr. Cyd Silence with [facility] hospitalist who will see and admit the patient.  Agrees with concern for PE but that this does not need emergent imaging, will order V/Q study for the morning as well as echo.   Final Clinical Impression(s) / ED  Diagnoses Final diagnoses:  Peripheral edema  Dyspnea on exertion  Prostate cancer metastatic to bone [facility])    Rx / DC Orders ED Discharge Orders    None       Janet Berlin 01/25/20 2215    Wyvonnia Dusky, MD 01/26/20 1021

## 2020-01-26 ENCOUNTER — Observation Stay (HOSPITAL_COMMUNITY): Payer: Medicare Other

## 2020-01-26 ENCOUNTER — Encounter (HOSPITAL_COMMUNITY): Payer: Self-pay | Admitting: Internal Medicine

## 2020-01-26 DIAGNOSIS — M170 Bilateral primary osteoarthritis of knee: Secondary | ICD-10-CM | POA: Diagnosis present

## 2020-01-26 DIAGNOSIS — I5021 Acute systolic (congestive) heart failure: Secondary | ICD-10-CM | POA: Diagnosis not present

## 2020-01-26 DIAGNOSIS — N184 Chronic kidney disease, stage 4 (severe): Secondary | ICD-10-CM | POA: Diagnosis not present

## 2020-01-26 DIAGNOSIS — Z8673 Personal history of transient ischemic attack (TIA), and cerebral infarction without residual deficits: Secondary | ICD-10-CM | POA: Diagnosis not present

## 2020-01-26 DIAGNOSIS — I2510 Atherosclerotic heart disease of native coronary artery without angina pectoris: Secondary | ICD-10-CM | POA: Diagnosis not present

## 2020-01-26 DIAGNOSIS — I5023 Acute on chronic systolic (congestive) heart failure: Secondary | ICD-10-CM | POA: Diagnosis not present

## 2020-01-26 DIAGNOSIS — E785 Hyperlipidemia, unspecified: Secondary | ICD-10-CM | POA: Diagnosis present

## 2020-01-26 DIAGNOSIS — Z8546 Personal history of malignant neoplasm of prostate: Secondary | ICD-10-CM | POA: Diagnosis not present

## 2020-01-26 DIAGNOSIS — Z515 Encounter for palliative care: Secondary | ICD-10-CM | POA: Diagnosis not present

## 2020-01-26 DIAGNOSIS — Z955 Presence of coronary angioplasty implant and graft: Secondary | ICD-10-CM | POA: Diagnosis not present

## 2020-01-26 DIAGNOSIS — R351 Nocturia: Secondary | ICD-10-CM | POA: Diagnosis present

## 2020-01-26 DIAGNOSIS — I248 Other forms of acute ischemic heart disease: Secondary | ICD-10-CM | POA: Diagnosis present

## 2020-01-26 DIAGNOSIS — I472 Ventricular tachycardia: Secondary | ICD-10-CM | POA: Diagnosis not present

## 2020-01-26 DIAGNOSIS — C7951 Secondary malignant neoplasm of bone: Secondary | ICD-10-CM | POA: Diagnosis present

## 2020-01-26 DIAGNOSIS — R609 Edema, unspecified: Secondary | ICD-10-CM | POA: Diagnosis not present

## 2020-01-26 DIAGNOSIS — D638 Anemia in other chronic diseases classified elsewhere: Secondary | ICD-10-CM | POA: Diagnosis not present

## 2020-01-26 DIAGNOSIS — N1832 Chronic kidney disease, stage 3b: Secondary | ICD-10-CM | POA: Diagnosis not present

## 2020-01-26 DIAGNOSIS — D631 Anemia in chronic kidney disease: Secondary | ICD-10-CM | POA: Diagnosis present

## 2020-01-26 DIAGNOSIS — I509 Heart failure, unspecified: Secondary | ICD-10-CM | POA: Diagnosis not present

## 2020-01-26 DIAGNOSIS — I10 Essential (primary) hypertension: Secondary | ICD-10-CM | POA: Diagnosis not present

## 2020-01-26 DIAGNOSIS — N390 Urinary tract infection, site not specified: Secondary | ICD-10-CM | POA: Diagnosis present

## 2020-01-26 DIAGNOSIS — I5041 Acute combined systolic (congestive) and diastolic (congestive) heart failure: Secondary | ICD-10-CM | POA: Diagnosis not present

## 2020-01-26 DIAGNOSIS — Z20822 Contact with and (suspected) exposure to covid-19: Secondary | ICD-10-CM | POA: Diagnosis present

## 2020-01-26 DIAGNOSIS — C61 Malignant neoplasm of prostate: Secondary | ICD-10-CM

## 2020-01-26 DIAGNOSIS — I130 Hypertensive heart and chronic kidney disease with heart failure and stage 1 through stage 4 chronic kidney disease, or unspecified chronic kidney disease: Secondary | ICD-10-CM | POA: Diagnosis present

## 2020-01-26 DIAGNOSIS — R0602 Shortness of breath: Secondary | ICD-10-CM | POA: Diagnosis present

## 2020-01-26 DIAGNOSIS — R600 Localized edema: Secondary | ICD-10-CM | POA: Diagnosis not present

## 2020-01-26 DIAGNOSIS — I5020 Unspecified systolic (congestive) heart failure: Secondary | ICD-10-CM

## 2020-01-26 DIAGNOSIS — E1122 Type 2 diabetes mellitus with diabetic chronic kidney disease: Secondary | ICD-10-CM | POA: Diagnosis present

## 2020-01-26 DIAGNOSIS — B9620 Unspecified Escherichia coli [E. coli] as the cause of diseases classified elsewhere: Secondary | ICD-10-CM | POA: Diagnosis present

## 2020-01-26 DIAGNOSIS — Z66 Do not resuscitate: Secondary | ICD-10-CM | POA: Diagnosis not present

## 2020-01-26 DIAGNOSIS — I442 Atrioventricular block, complete: Secondary | ICD-10-CM | POA: Diagnosis present

## 2020-01-26 DIAGNOSIS — N401 Enlarged prostate with lower urinary tract symptoms: Secondary | ICD-10-CM | POA: Diagnosis present

## 2020-01-26 LAB — BASIC METABOLIC PANEL
Anion gap: 9 (ref 5–15)
BUN: 29 mg/dL — ABNORMAL HIGH (ref 8–23)
CO2: 27 mmol/L (ref 22–32)
Calcium: 7.9 mg/dL — ABNORMAL LOW (ref 8.9–10.3)
Chloride: 104 mmol/L (ref 98–111)
Creatinine, Ser: 2.01 mg/dL — ABNORMAL HIGH (ref 0.61–1.24)
GFR calc Af Amer: 34 mL/min — ABNORMAL LOW (ref >60)
GFR calc non Af Amer: 29 mL/min — ABNORMAL LOW (ref >60)
Glucose, Bld: 99 mg/dL (ref 70–99)
Potassium: 4 mmol/L (ref 3.5–5.1)
Sodium: 140 mmol/L (ref 135–145)

## 2020-01-26 LAB — RETICULOCYTES
Immature Retic Fract: 32.5 % — ABNORMAL HIGH (ref 2.3–15.9)
RBC.: 2.88 MIL/uL — ABNORMAL LOW (ref 4.22–5.81)
Retic Count, Absolute: 110.3 K/uL (ref 19.0–186.0)
Retic Ct Pct: 3.8 % — ABNORMAL HIGH (ref 0.4–3.1)

## 2020-01-26 LAB — ECHOCARDIOGRAM COMPLETE
Area-P 1/2: 3.08 cm2
Height: 67 in
S' Lateral: 4.4 cm
Weight: 3024 oz

## 2020-01-26 LAB — MAGNESIUM: Magnesium: 2.4 mg/dL (ref 1.7–2.4)

## 2020-01-26 MED ORDER — PERFLUTREN LIPID MICROSPHERE
1.0000 mL | INTRAVENOUS | Status: AC | PRN
  Administered 2020-01-26: 3 mL via INTRAVENOUS
  Filled 2020-01-26: qty 10

## 2020-01-26 MED ORDER — OXYCODONE HCL 5 MG PO TABS: 5.0000 mg | ORAL_TABLET | ORAL | Status: DC | PRN

## 2020-01-26 MED ORDER — SODIUM CHLORIDE 0.9% FLUSH
3.0000 mL | Freq: Two times a day (BID) | INTRAVENOUS | Status: DC
  Administered 2020-01-26 – 2020-01-31 (×10): 3 mL via INTRAVENOUS

## 2020-01-26 MED ORDER — ISOSORB DINITRATE-HYDRALAZINE 20-37.5 MG PO TABS
1.0000 | ORAL_TABLET | Freq: Two times a day (BID) | ORAL | Status: DC
  Administered 2020-01-26 – 2020-01-31 (×11): 1 via ORAL
  Filled 2020-01-26 (×12): qty 1

## 2020-01-26 MED ORDER — ACETAMINOPHEN 325 MG PO TABS: 650.0000 mg | ORAL_TABLET | ORAL | Status: DC | PRN

## 2020-01-26 MED ORDER — METOPROLOL SUCCINATE ER 25 MG PO TB24
25.0000 mg | ORAL_TABLET | Freq: Every day | ORAL | Status: DC
  Administered 2020-01-26 – 2020-01-27 (×2): 25 mg via ORAL
  Filled 2020-01-26 (×2): qty 1

## 2020-01-26 MED ORDER — ASPIRIN EC 81 MG PO TBEC
81.0000 mg | DELAYED_RELEASE_TABLET | Freq: Every day | ORAL | Status: DC
  Administered 2020-01-26 – 2020-01-31 (×6): 81 mg via ORAL
  Filled 2020-01-26 (×6): qty 1

## 2020-01-26 MED ORDER — POLYETHYLENE GLYCOL 3350 17 G PO PACK
17.0000 g | PACK | Freq: Every day | ORAL | Status: DC | PRN
  Administered 2020-01-27 – 2020-01-29 (×2): 17 g via ORAL
  Filled 2020-01-26 (×2): qty 1

## 2020-01-26 MED ORDER — ONDANSETRON HCL 4 MG/2ML IJ SOLN: 4.0000 mg | Freq: Four times a day (QID) | INTRAMUSCULAR | Status: DC | PRN

## 2020-01-26 MED ORDER — SODIUM CHLORIDE 0.9% FLUSH: 3.0000 mL | INTRAVENOUS | Status: DC | PRN

## 2020-01-26 MED ORDER — NITROGLYCERIN 0.4 MG SL SUBL: 0.4000 mg | SUBLINGUAL_TABLET | SUBLINGUAL | Status: DC | PRN

## 2020-01-26 MED ORDER — FENTANYL 25 MCG/HR TD PT72
1.0000 | MEDICATED_PATCH | TRANSDERMAL | Status: DC
  Administered 2020-01-27 – 2020-01-30 (×2): 1 via TRANSDERMAL
  Filled 2020-01-26 (×2): qty 1

## 2020-01-26 MED ORDER — TAMSULOSIN HCL 0.4 MG PO CAPS
0.4000 mg | ORAL_CAPSULE | Freq: Two times a day (BID) | ORAL | Status: DC
  Administered 2020-01-26 – 2020-01-31 (×11): 0.4 mg via ORAL
  Filled 2020-01-26 (×11): qty 1

## 2020-01-26 MED ORDER — SODIUM CHLORIDE 0.9 % IV SOLN: 250.0000 mL | INTRAVENOUS | Status: DC | PRN

## 2020-01-26 MED ORDER — SODIUM CHLORIDE 0.9 % IV SOLN
1.0000 g | INTRAVENOUS | Status: DC
  Administered 2020-01-26 – 2020-01-27 (×2): 1 g via INTRAVENOUS
  Filled 2020-01-26: qty 1
  Filled 2020-01-26 (×2): qty 10

## 2020-01-26 MED ORDER — PREDNISONE 5 MG PO TABS
5.0000 mg | ORAL_TABLET | Freq: Two times a day (BID) | ORAL | Status: DC
  Administered 2020-01-26 – 2020-01-30 (×9): 5 mg via ORAL
  Filled 2020-01-26 (×10): qty 1

## 2020-01-26 MED ORDER — ENOXAPARIN SODIUM 30 MG/0.3ML ~~LOC~~ SOLN
30.0000 mg | SUBCUTANEOUS | Status: DC
  Administered 2020-01-26 – 2020-01-31 (×7): 30 mg via SUBCUTANEOUS
  Filled 2020-01-26 (×6): qty 0.3

## 2020-01-26 MED ORDER — FUROSEMIDE 10 MG/ML IJ SOLN
20.0000 mg | Freq: Two times a day (BID) | INTRAMUSCULAR | Status: DC
  Administered 2020-01-26 – 2020-01-27 (×3): 20 mg via INTRAVENOUS
  Filled 2020-01-26 (×2): qty 2
  Filled 2020-01-26: qty 4

## 2020-01-26 MED ORDER — ABIRATERONE ACETATE 250 MG PO TABS
1000.0000 mg | ORAL_TABLET | Freq: Every day | ORAL | Status: DC
  Filled 2020-01-26: qty 4

## 2020-01-26 NOTE — ED Notes (Signed)
Echo at bedside

## 2020-01-26 NOTE — Progress Notes (Signed)
  Echocardiogram 2D Echocardiogram has been performed.  [name] 01/26/2020, 12:26 PM

## 2020-01-26 NOTE — Progress Notes (Signed)
86 year old male with prior h/o metastatic prostate cancer to bone, CAD, stage 3 b CKD, coronary artery disease s/p MI with stent to LAD , hypertension presents to ED for bilateral lower extremity edema.  He is being admitted to [facility] for evaluation of CHF.  Patient admitted earlier this morning by Dr. Cyd Silence please see his note for detailed H&P. He was started on IV Lasix, continue the same, get echocardiogram for further evaluation.  Patient seen and examined at bedside appears comfortable not in any distress on room air.   Hosie Poisson, MD

## 2020-01-26 NOTE — H&P (Signed)
History and Physical    Tony [name] DPO:[id number] DOB: [birth year] DOA: 01/25/2020  PCP: [name], MD  Patient coming from: Home   Chief Complaint:  Chief Complaint  Patient presents with  . Leg Swelling     HPI:    86 year old male with past medical history of metastatic prostate cancer to bone, coronary artery disease (S/P MI with Stent to LAD in 2012), chronic kidney disease stage IIIb, hypertension who presents to [facility] emergency department with complaints of progressive bilateral lower extremity edema.  Patient explains that for the past several months he has been experiencing bilateral lower extremity swelling.  He has been mentioning this swelling to his outpatient providers and up to this point it was thought that the swelling was likely secondary to his Zytiga regimen.  However, patient states that in the past 3 to 4 days, the bilateral lower extremity edema has rapidly progressed.  This not associated with any pain.  Patient does complain of some associated mild shortness of breath, particularly with exertion.  Patient denies any paroxysmal nocturnal dyspnea or pillow orthopnea.  Patient denies chest pain.  Patient symptoms continue to worsen until he eventually presented to [facility] emergency department for evaluation.  Upon evaluation in the emergency department patient patient was noted to have extensive peripheral edema.  Chest x-ray was performed which did not reveal any obvious pulmonary edema however.  Patient was administered 20 mg of IV Lasix with excellent urine output.  Due to concerns for acute congestive heart failure the hospitalist group has been called to assess the patient for admission to the hospital.  Review of Systems:   Review of Systems  Constitutional: Positive for malaise/fatigue.  Respiratory: Positive for shortness of breath.   Cardiovascular: Positive for leg swelling.  All other systems reviewed and are  negative.   Past Medical History:  Diagnosis Date  . BPH associated with nocturia    flomax 0.4--> 0.8 mg trial. nocturia if has coffee. some incontinence  . CAD (coronary artery disease)    LAD Stent 2012. 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   . Gout    no rx. apparently 1x in past  . History of stroke    no aspirin before stroke. no deficits. slurred words at time of stroke  . Hyperlipidemia   . Hypertension     Past Surgical History:  Procedure Laterality Date  . CARDIAC SURGERY     Stint  . CORONARY STENT PLACEMENT    . left arm fracture s/p surgery    . right leg fracture s.p surgery- screws like arm       reports that he quit smoking about 67 years ago. His smoking use included cigarettes. He has a 0.50 pack-year smoking history. He has never used smokeless tobacco. He reports that he does not drink alcohol and does not use drugs.  Allergies  Allergen Reactions  . Gabapentin Other (See Comments)    Patient states caused hallucinations     Family History  Problem Relation Age of Onset  . Breast cancer Mother   . Heart disease Father   . Heart disease Brother        x2  . Prostate cancer Brother      Prior to Admission medications   Medication Sig Start Date End Date Taking? Authorizing Provider  abiraterone acetate (ZYTIGA) 250 MG tablet TAKE 4 TABLETS (1,000 MG TOTAL) BY MOUTH DAILY. TAKE ON AN EMPTY  STOMACH 1 HOUR BEFORE OR 2 HOURS AFTER A MEAL 01/23/20   Brunetta Genera, MD  aspirin EC 81 MG tablet Take 1 tablet (81 mg total) by mouth daily. 01/29/17   [name], MD  BIDIL 20-37.5 MG tablet TAKE 1 TABLET BY MOUTH TWICE DAILY 05/16/19   [name], MD  calcium-vitamin D (OSCAL 500/200 D-3) 500-200 MG-UNIT tablet Take 1 tablet by mouth daily with breakfast. 08/09/17   Brunetta Genera, MD  fentaNYL (DURAGESIC) 25 MCG/HR Place 1 patch onto the skin every 3 (three) days. 12/30/19   Brunetta Genera, MD  furosemide  (LASIX) 20 MG tablet Take 1 tablet (20 mg total) by mouth daily as needed for fluid or edema. 07/16/19   [name], MD  gabapentin (NEURONTIN) 100 MG capsule Take 2 capsules (200 mg total) by mouth at bedtime. Patient not taking: Reported on 10/24/2019 09/23/19   Sandi Mealy E., PA-C  metoprolol succinate (TOPROL-XL) 25 MG 24 hr tablet Take 1 tablet (25 mg total) by mouth daily. 07/16/19   [name], MD  Multiple Vitamins-Minerals [facility] COMPLETE PO) Take by mouth.    [provider]  nitroGLYCERIN (NITROSTAT) 0.4 MG SL tablet Place 1 tablet (0.4 mg total) under the tongue every 5 (five) minutes as needed for chest pain (3 maximum before seeking care). 11/17/16   Marin Olp, MD  Omega-3 1000 MG CAPS Take 1 capsule by mouth daily.     [provider]  oxyCODONE (OXY IR/ROXICODONE) 5 MG immediate release tablet Take 1 tablet (5 mg total) by mouth every 4 (four) hours as needed for severe pain. 10/17/19   Brunetta Genera, MD  polyethylene glycol [facility]) packet Take 17 g by mouth daily. 09/06/17   Brunetta Genera, MD  predniSONE (DELTASONE) 5 MG tablet Take 1 tablet (5 mg total) by mouth 2 (two) times daily with a meal. 05/23/19 02/17/20  Brunetta Genera, MD  predniSONE (DELTASONE) 5 MG tablet 6 tab x 1 day, 5 tab x 1 day, 4 tab x 1 day, 3 tab x 1 day, 2 tab x 1 day, 1 tab x 1 day, stop Patient not taking: Reported on 10/24/2019 09/23/19   Harle Stanford., PA-C  senna-docusate (SENNA S) 8.6-50 MG tablet Take 2 tablets by mouth 2 (two) times daily. 04/17/19   Brunetta Genera, MD  tamsulosin (FLOMAX) 0.4 MG CAPS capsule TAKE 1 CAPSULE BY MOUTH TWICE DAILY 06/17/19   [name], MD  valACYclovir (VALTREX) 500 MG tablet Take 1 tablet (500 mg total) by mouth 3 (three) times daily. Patient not taking: Reported on 10/24/2019 09/23/19   Harle Stanford., PA-C  vitamin B-12 (CYANOCOBALAMIN) 1000 MCG tablet Take 1 tablet (1,000 mcg total) by mouth daily. 07/22/17    Cristal Ford, DO  Vitamin D, Ergocalciferol, (DRISDOL) 1.25 MG (50000 UNIT) CAPS capsule Take 1 capsule Monday, Wednesday and Friday. 07/04/19   Brunetta Genera, MD    Physical Exam: Vitals:   01/25/20 2100 01/25/20 2200 01/25/20 2215 01/26/20 0000  BP: 139/75 (!) 158/93  (!) 143/65  Pulse: 83 90 81 73  Resp: 19 (!) 24 15 17   Temp:      TempSrc:      SpO2: 96% 93% 97% 96%  Weight:      Height:        Constitutional: Acute alert and oriented x3, no associated distress.   Skin: no rashes, no lesions, good skin turgor noted. Eyes:  Pupils are equally reactive to light.  No evidence of scleral icterus or conjunctival pallor.  ENMT: Moist mucous membranes noted.  Posterior pharynx clear of any exudate or lesions.   Neck: normal, supple, no masses, no thyromegaly.  Notable elevated jugular venous pulse at 90 degrees. Respiratory: Mild bibasilar rales without any evidence of associated wheezing.. Normal respiratory effort. No accessory muscle use.  Cardiovascular: Regular rate and rhythm, no murmurs / rubs / gallops.  Notable bilateral lower extremity pitting edema that tracks in the distal bilateral lower extremities up to the lower back.  2+ pedal pulses. No carotid bruits.  Chest:   Nontender without crepitus or deformity.   Back:   Nontender without crepitus or deformity. Abdomen: Abdomen is soft and nontender.  No evidence of intra-abdominal masses.  Positive bowel sounds noted in all quadrants.   Musculoskeletal: Bilateral lower extremity edema as noted above.  No joint deformity upper and lower extremities. Good ROM, no contractures. Normal muscle tone.  Neurologic: CN 2-12 grossly intact. Sensation intact.  Patient moving all 4 extremities spontaneously.  Patient is following all commands.  Patient is responsive to verbal stimuli.   Psychiatric: Patient exhibits normal mood with appropriate affect.  Patient seems to possess insight as to their current situation.     Labs on  Admission: I have personally reviewed following labs and imaging studies -   CBC: Recent Labs  Lab 01/25/20 1633  WBC 5.7  HGB 9.6*  HCT 31.3*  MCV 106.8*  PLT 132   Basic Metabolic Panel: Recent Labs  Lab 01/25/20 1633  NA 141  K 4.3  CL 104  CO2 28  GLUCOSE 119*  BUN 31*  CREATININE 2.00*  CALCIUM 7.8*   GFR: Estimated Creatinine Clearance: 27.2 mL/min (A) (by C-G formula based on SCr of 2 mg/dL (H)). Liver Function Tests: Recent Labs  Lab 01/25/20 1812  AST 23  ALT 13  ALKPHOS 188*  BILITOT 0.9  PROT 6.1*  ALBUMIN 3.3*   No results for input(s): LIPASE, AMYLASE in the last 168 hours. No results for input(s): AMMONIA in the last 168 hours. Coagulation Profile: No results for input(s): INR, PROTIME in the last 168 hours. Cardiac Enzymes: No results for input(s): CKTOTAL, CKMB, CKMBINDEX, TROPONINI in the last 168 hours. BNP (last 3 results) No results for input(s): PROBNP in the last 8760 hours. HbA1C: No results for input(s): HGBA1C in the last 72 hours. CBG: No results for input(s): GLUCAP in the last 168 hours. Lipid Profile: No results for input(s): CHOL, HDL, LDLCALC, TRIG, CHOLHDL, LDLDIRECT in the last 72 hours. Thyroid Function Tests: No results for input(s): TSH, T4TOTAL, FREET4, T3FREE, THYROIDAB in the last 72 hours. Anemia Panel: No results for input(s): VITAMINB12, FOLATE, FERRITIN, TIBC, IRON, RETICCTPCT in the last 72 hours. Urine analysis:    Component Value Date/Time   COLORURINE YELLOW 01/25/2020 1619   APPEARANCEUR HAZY (A) 01/25/2020 1619   LABSPEC 1.018 01/25/2020 1619   PHURINE 5.0 01/25/2020 1619   GLUCOSEU NEGATIVE 01/25/2020 1619   HGBUR SMALL (A) 01/25/2020 1619   BILIRUBINUR NEGATIVE 01/25/2020 1619   KETONESUR NEGATIVE 01/25/2020 1619   PROTEINUR NEGATIVE 01/25/2020 1619   UROBILINOGEN 1.0 05/06/2012 2022   NITRITE NEGATIVE 01/25/2020 1619   LEUKOCYTESUR MODERATE (A) 01/25/2020 1619    Radiological Exams on  Admission - Personally Reviewed: DG Chest 2 View  Result Date: 01/25/2020 CLINICAL DATA:  Patient with bilateral feet and leg swelling. EXAM: CHEST - 2 VIEW COMPARISON:  Chest radiograph January 25, 2020 FINDINGS: Monitoring leads overlie the patient. Stable cardiomegaly. No large area pulmonary consolidation. No pleural effusion or pneumothorax. Patchy sclerotic osseous changes throughout the axial and appendicular skeleton compatible with history of metastatic prostate cancer. IMPRESSION: Cardiomegaly. No acute cardiopulmonary process. Electronically Signed   By: Lovey Newcomer M.D.   On: 01/25/2020 18:49    EKG: Personally reviewed.  Rhythm is normal sinus rhythm with heart rate of 80 bpm.  No dynamic ST segment changes appreciated.  Assessment/Plan Active Problems:   Acute on chronic congestive heart failure [facility])   Patient presenting with several month history of progressive bilateral lower extremity edema which has rapidly worsened in the past 3 to 4 days.  With some associated dyspnea on exertion  On presentation patient found to have markedly elevated BNP and extensive bilateral lower extremity edema that tracks up to the lower back.  Surprisingly, no evidence of pulmonary edema on chest x-ray.  Patient is likely suffering from some degree of acute congestive heart failure, either systolic or diastolic, particularly with his history of coronary artery disease.  Patient has already been given 20 mg of IV Lasix by the emergency department staff.  While this dose typically would be relatively low for someone with his creatinine, he seems to be having excellent urine output with this dose and therefore this will be continued at 20 mg IV twice daily.  This can be increased later if necessary.  Strict input and output monitoring  Daily weights  Serial chemistries for monitoring of renal function and electrolytes  Echocardiogram ordered for the morning    Essential  hypertension  . Resume patients home regimen or oral antihypertensives . Titrate antihypertensive regimen as necessary to achieve adequate BP control . PRN intravenous antihypertensives for excessively elevated blood pressure    Coronary artery disease involving native coronary artery of native heart without angina pectoris  . Patient is currently chest pain free . Monitoring patient on telemetry . Continue home regimen of antiplatelet therapy, l and AV nodal blocking therapy . Upon review of outpatient notes it seems that patient's home regimen of statin therapy was recently discontinued due to lower extremity pain.    Chronic kidney disease, stage 3b  . Strict intake and output monitoring . Creatinine near baseline . Minimizing nephrotoxic agents as much as possible . Serial chemistries to monitor renal function and electrolytes    Malignant neoplasm of prostate metastatic to bone [facility])   Continuing home regimen of Zytiga  Continuing home regimen of Flomax.  Notable elevation of alkaline phosphatase which is chronic for this patient with metastasis to the bone  Continue outpatient follow-up with oncology, Dr. [name]    Code Status:  Full code Family Communication: deferred   Status is: Observation  The patient remains OBS appropriate and will d/c before 2 midnights.  Dispo: The patient is from: Home              Anticipated d/c is to: Home              Anticipated d/c date is: 2 days              Patient currently is not medically stable to d/c.        Vernelle Emerald MD [facility] Pager [phone]  If 7PM-7AM, please contact night-coverage [email or web address] Use universal [place] password for that web site. If you do not have the password, please call the hospital operator.  01/26/2020, 1:43 AM

## 2020-01-26 NOTE — Plan of Care (Signed)

## 2020-01-26 NOTE — Evaluation (Signed)
Physical Therapy Evaluation Patient Details Name: Tony [name] MRN: [id number] DOB: [birth year] Today's Date: 01/26/2020   History of Present Illness  86 year old male with prior h/o metastatic prostate cancer to bone, CAD, stage 3 b CKD, coronary artery disease s/p MI with stent to LAD , hypertension presents to ED for bilateral lower extremity edema and admitted for acute on chronic congestive heart failure  Clinical Impression  Pt admitted with above diagnosis.  Pt currently with functional limitations due to the deficits listed below (see PT Problem List). Pt will benefit from skilled PT to increase their independence and safety with mobility to allow discharge to the venue listed below.  Pt assisted with ambulating short distance limited by fatigue and pt c/o swollen feet.  Pt plans to return home upon d/c and typically uses a rollator for ambulation however reports he needs a new one.     Follow Up Recommendations No PT follow up;Supervision for mobility/OOB    Equipment Recommendations  None recommended by PT  (may need new rollator?)   Recommendations for Other Services       Precautions / Restrictions Precautions Precautions: Fall      Mobility  Bed Mobility Overal bed mobility: Needs Assistance Bed Mobility: Supine to Sit;Sit to Supine     Supine to sit: Min guard Sit to supine: Mod assist   General bed mobility comments: assist for LEs onto bed  Transfers Overall transfer level: Needs assistance Equipment used: Rolling walker (2 wheeled) Transfers: Sit to/from Stand Sit to Stand: Min guard         General transfer comment: min/guard for safety  Ambulation/Gait Ambulation/Gait assistance: Min guard Gait Distance (Feet): 80 Feet Assistive device: Rolling walker (2 wheeled) Gait Pattern/deviations: Step-through pattern;Decreased stride length     General Gait Details: steady with RW, distance to tolerance, pt reports tingling in feet (very  edematous)  Stairs            Wheelchair Mobility    Modified Rankin (Stroke Patients Only)       Balance Overall balance assessment: Needs assistance         Standing balance support: No upper extremity supported Standing balance-Leahy Scale: Fair Standing balance comment: able to put on mask with both hands in standing, uses walker for ambulation                             Pertinent Vitals/Pain Pain Assessment: No/denies pain    Home Living Family/patient expects to be discharged to:: Private residence Living Arrangements: Children (daughter)   Type of Home: House Home Access: Level entry     Home Layout: One level Home Equipment: Environmental consultant - 4 wheels      Prior Function Level of Independence: Independent with assistive device(s)         Comments: uses rollator     Hand Dominance        Extremity/Trunk Assessment        Lower Extremity Assessment Lower Extremity Assessment: Generalized weakness (edematous lower legs)       Communication   Communication: No difficulties  Cognition Arousal/Alertness: Awake/alert Behavior During Therapy: WFL for tasks assessed/performed Overall Cognitive Status: Within Functional Limits for tasks assessed                                        General  Comments      Exercises     Assessment/Plan    PT Assessment Patient needs continued PT services  PT Problem List Decreased activity tolerance;Decreased mobility;Decreased knowledge of use of DME       PT Treatment Interventions DME instruction;Gait training;Balance training;Therapeutic exercise;Functional mobility training;Therapeutic activities;Patient/family education    PT Goals (Current goals can be found in the Care Plan section)  Acute Rehab PT Goals PT Goal Formulation: With patient Time For Goal Achievement: 02/09/20 Potential to Achieve Goals: Good    Frequency Min 3X/week   Barriers to discharge         Co-evaluation               AM-PAC PT "6 Clicks" Mobility  Outcome Measure Help needed turning from your back to your side while in a flat bed without using bedrails?: A Little Help needed moving from lying on your back to sitting on the side of a flat bed without using bedrails?: A Little Help needed moving to and from a bed to a chair (including a wheelchair)?: A Little Help needed standing up from a chair using your arms (e.g., wheelchair or bedside chair)?: A Little Help needed to walk in hospital room?: A Little Help needed climbing 3-5 steps with a railing? : A Little 6 Click Score: 18    End of Session Equipment Utilized During Treatment: Gait belt Activity Tolerance: Patient tolerated treatment well Patient left: in bed;with call bell/phone within reach Nurse Communication: Mobility status PT Visit Diagnosis: Other abnormalities of gait and mobility (R26.89)    Time: 1200-1220 PT Time Calculation (min) (ACUTE ONLY): 20 min   Charges:   PT Evaluation $PT Eval Low Complexity: 1 Low        Kati PT, DPT Acute Rehabilitation Services Pager: [phone] Office: [phone]  York Ram E 01/26/2020, 2:11 PM

## 2020-01-27 ENCOUNTER — Ambulatory Visit: Payer: Medicare Other | Admitting: Family Medicine

## 2020-01-27 DIAGNOSIS — I5041 Acute combined systolic (congestive) and diastolic (congestive) heart failure: Secondary | ICD-10-CM

## 2020-01-27 DIAGNOSIS — Z515 Encounter for palliative care: Secondary | ICD-10-CM

## 2020-01-27 DIAGNOSIS — I2510 Atherosclerotic heart disease of native coronary artery without angina pectoris: Secondary | ICD-10-CM

## 2020-01-27 DIAGNOSIS — N184 Chronic kidney disease, stage 4 (severe): Secondary | ICD-10-CM

## 2020-01-27 DIAGNOSIS — I10 Essential (primary) hypertension: Secondary | ICD-10-CM

## 2020-01-27 DIAGNOSIS — Z7189 Other specified counseling: Secondary | ICD-10-CM

## 2020-01-27 DIAGNOSIS — I420 Dilated cardiomyopathy: Secondary | ICD-10-CM

## 2020-01-27 LAB — IRON AND TIBC
Iron: 43 ug/dL — ABNORMAL LOW (ref 45–182)
Saturation Ratios: 27 % (ref 17.9–39.5)
TIBC: 159 ug/dL — ABNORMAL LOW (ref 250–450)
UIBC: 116 ug/dL

## 2020-01-27 LAB — BASIC METABOLIC PANEL
Anion gap: 8 (ref 5–15)
BUN: 32 mg/dL — ABNORMAL HIGH (ref 8–23)
CO2: 27 mmol/L (ref 22–32)
Calcium: 7 mg/dL — ABNORMAL LOW (ref 8.9–10.3)
Chloride: 103 mmol/L (ref 98–111)
Creatinine, Ser: 2.22 mg/dL — ABNORMAL HIGH (ref 0.61–1.24)
GFR calc Af Amer: 30 mL/min — ABNORMAL LOW (ref >60)
GFR calc non Af Amer: 26 mL/min — ABNORMAL LOW (ref >60)
Glucose, Bld: 129 mg/dL — ABNORMAL HIGH (ref 70–99)
Potassium: 3.9 mmol/L (ref 3.5–5.1)
Sodium: 138 mmol/L (ref 135–145)

## 2020-01-27 LAB — FOLATE: Folate: 26.7 ng/mL (ref >5.9)

## 2020-01-27 LAB — FERRITIN: Ferritin: 1208 ng/mL — ABNORMAL HIGH (ref 24–336)

## 2020-01-27 LAB — VITAMIN B12: Vitamin B-12: 1026 pg/mL — ABNORMAL HIGH (ref 180–914)

## 2020-01-27 MED ORDER — FUROSEMIDE 10 MG/ML IJ SOLN
40.0000 mg | Freq: Two times a day (BID) | INTRAMUSCULAR | Status: DC
  Administered 2020-01-27 – 2020-01-30 (×6): 40 mg via INTRAVENOUS
  Filled 2020-01-27 (×6): qty 4

## 2020-01-27 MED ORDER — CARVEDILOL 6.25 MG PO TABS
6.2500 mg | ORAL_TABLET | Freq: Two times a day (BID) | ORAL | Status: DC
  Administered 2020-01-27 – 2020-01-28 (×2): 6.25 mg via ORAL
  Filled 2020-01-27 (×2): qty 1

## 2020-01-27 NOTE — Progress Notes (Signed)
PROGRESS NOTE    Tony [name]  OZD:[id number] DOB: 11-08-1930 DOA: 01/25/2020 PCP: [name], MD    Chief Complaint  Patient presents with  . Leg Swelling    Brief Narrative:   86 year old male with prior h/o metastatic prostate cancer to bone, CAD, stage 3 b CKD, coronary artery disease s/p MI with stent to LAD , hypertension presents to ED for bilateral lower extremity edema.  He was admitted to [facility] for evaluation of CHF.  Patient was started on IV Lasix the dose increased to 40 mg twice daily by cardiology.  Echocardiogram ordered showed Left ventricular ejection fraction, by estimation, is 35 to 40%. The left ventricle has moderately decreased function. The left ventricle  demonstrates global hypokinesis. There is moderate concentric left ventricular hypertrophy. Left ventricular  diastolic parameters are consistent with Grade I diastolic dysfunction (impaired relaxation).  Right ventricular systolic function is mildly reduced and there is mildly elevated pulmonary artery systolic pressure.  Assessment & Plan:   Active Problems:   Essential hypertension   Coronary artery disease involving native coronary artery of native heart without angina pectoris   Chronic kidney disease, stage 3b   Prostate cancer metastatic to bone (HCC)   Acute on chronic congestive heart failure (HCC)   Bilateral leg edema   Palliative care by specialist   Goals of care, counseling/discussion   New onset acute systolic and diastolic heart failure in the setting of known coronary artery disease Elevated BNP at 662, mildly elevated troponins, bilateral leg edema Patient was started on IV Lasix with good diuresis.  Patient has diuresed about 3.5 L since admission.  Cardiology consulted recommended to increase IV Lasix to 40 mg twice daily. Echocardiogram reviewed with the patient. Continue with strict intake and output and daily weights. Patient's pedal edema has improved. Patient currently denies  any chest pain shortness of breath, palpitations, orthopnea or PND.  Elevated troponins probably from demand ischemia from CHF patient denies any chest pain at this time.   History of coronary artery disease s/p PCI to LAD. Continue with aspirin, Coreg 6.25 mg twice daily, 1 tab twice daily.   Essential hypertension Blood pressure parameters are better controlled.  E. coli UTI  patient is on Rocephin, no urine cultures are growing E. coli further sensitivities are pending.   Stage IIIb CKD Creatinine at baseline around 2, continue to monitor creatinine while on IV Lasix.    Metastatic prostate cancer Follows up with Dr [name] as an outpatient.   Anemia of chronic disease Baseline hemoglobin around 10, currently at 9.6. Anemia panel reviewed showed a low iron levels around 43. Ferritin 1208, vitamin B12 1026, adequate folate levels. Continue to monitor    DVT prophylaxis: Lovenox Code Status: Full code Family Communication: (Daughter at bedside Disposition:   Status is: Inpatient  Remains inpatient appropriate because:Ongoing diagnostic testing needed not appropriate for outpatient work up, Unsafe d/c plan, IV treatments appropriate due to intensity of illness or inability to take PO and Inpatient level of care appropriate due to severity of illness further diuresis with IV Lasix and IV Rocephin for UTI  Dispo: The patient is from: Home              Anticipated d/c is to: Pending              Anticipated d/c date is: 2 days              Patient currently is not medically stable to  d/c.       Consultants:  Cardiology Palliative care  Procedures: Echocardiogram Antimicrobials: Rocephin for urinary tract infection since admission.  Subjective: Patient in good spirits reports feeling much better after diuresis, denies any chest pain or shortness of breath at this time  Objective: Vitals:   01/26/20 2210 01/27/20 0151 01/27/20 0500 01/27/20 1333  BP: 136/71  118/61  127/67  Pulse: 81 74  86  Resp: 20 16  20   Temp: 98.4 F (36.9 C) 98.3 F (36.8 C)  97.9 F (36.6 C)  TempSrc:    Oral  SpO2: 98% 98%  100%  Weight:   83.7 kg   Height:        Intake/Output Summary (Last 24 hours) at 01/27/2020 1719 Last data filed at 01/27/2020 1705 Gross per 24 hour  Intake 940.07 ml  Output 1300 ml  Net -359.93 ml   Filed Weights   01/25/20 1542 01/27/20 0500  Weight: 85.7 kg 83.7 kg    Examination:  General exam: Appears calm and comfortable  Respiratory system: Clear to auscultation. Respiratory effort normal. Cardiovascular system: S1 & S2 heard, RRR. No JVD, murmurs, improving pedal edema. Gastrointestinal system: Abdomen is nondistended, soft and nontender. Normal bowel sounds heard. Central nervous system: Alert and oriented. No focal neurological deficits. Extremities: Improving pedal edema Skin: No rashes, lesions or ulcers Psychiatry:  Mood & affect appropriate.     Data Reviewed: I have personally reviewed following labs and imaging studies  CBC: Recent Labs  Lab 01/25/20 1633  WBC 5.7  HGB 9.6*  HCT 31.3*  MCV 106.8*  PLT 952    Basic Metabolic Panel: Recent Labs  Lab 01/25/20 1633 01/26/20 0452 01/27/20 0454  NA 141 140 138  K 4.3 4.0 3.9  CL 104 104 103  CO2 28 27 27   GLUCOSE 119* 99 129*  BUN 31* 29* 32*  CREATININE 2.00* 2.01* 2.22*  CALCIUM 7.8* 7.9* 7.0*  MG  --  2.4  --     GFR: Estimated Creatinine Clearance: 24.2 mL/min (A) (by C-G formula based on SCr of 2.22 mg/dL (H)).  Liver Function Tests: Recent Labs  Lab 01/25/20 1812  AST 23  ALT 13  ALKPHOS 188*  BILITOT 0.9  PROT 6.1*  ALBUMIN 3.3*    CBG: No results for input(s): GLUCAP in the last 168 hours.   Recent Results (from the past 240 hour(s))  Urine C&S     Status: Abnormal (Preliminary result)   Collection Time: 01/25/20  4:19 PM   Specimen: Urine, Random  Result Value Ref Range Status   Specimen Description   Final     URINE, RANDOM Performed at [facility] [address], [place], [place] [address]    Special Requests   Final    NONE Performed at [facility], [place] [address], [place], [place] [address]    Culture (A)  Final    >=100,000 COLONIES/mL ESCHERICHIA COLI SUSCEPTIBILITIES TO FOLLOW Performed at [facility], [place] [address], [place], [place] [address]    Report Status PENDING  Incomplete  Respiratory Panel by RT PCR (Flu A&B, Covid) - Nasopharyngeal Swab     Status: None   Collection Time: 01/25/20  8:22 PM   Specimen: Nasopharyngeal Swab  Result Value Ref Range Status   SARS Coronavirus 2 by RT PCR NEGATIVE NEGATIVE Final    Comment: (NOTE) SARS-CoV-2 target nucleic acids are NOT DETECTED.  The SARS-CoV-2 RNA is generally detectable in upper  respiratoy specimens during the acute phase of infection. The lowest concentration of SARS-CoV-2 viral copies this assay can detect is 131 copies/mL. A negative result does not preclude SARS-Cov-2 infection and should not be used as the sole basis for treatment or other patient management decisions. A negative result may occur with  improper specimen collection/handling, submission of specimen other than nasopharyngeal swab, presence of viral mutation(s) within the areas targeted by this assay, and inadequate number of viral copies (<131 copies/mL). A negative result must be combined with clinical observations, patient history, and epidemiological information. The expected result is Negative.  Fact Sheet for Patients:  PinkCheek.be  Fact Sheet for Healthcare Providers:  GravelBags.it  This test is no t yet approved or cleared by the [place] FDA and  has been authorized for detection and/or diagnosis of SARS-CoV-2 by FDA under an Emergency Use Authorization (EUA). This EUA will remain  in effect (meaning this test can be used) for the  duration of the COVID-19 declaration under Section 564(b)(1) of the Act, 21 U.S.C. section 360bbb-3(b)(1), unless the authorization is terminated or revoked sooner.     Influenza A by PCR NEGATIVE NEGATIVE Final   Influenza B by PCR NEGATIVE NEGATIVE Final    Comment: (NOTE) The Xpert Xpress SARS-CoV-2/FLU/RSV assay is intended as an aid in  the diagnosis of influenza from Nasopharyngeal swab specimens and  should not be used as a sole basis for treatment. Nasal washings and  aspirates are unacceptable for Xpert Xpress SARS-CoV-2/FLU/RSV  testing.  Fact Sheet for Patients: PinkCheek.be  Fact Sheet for Healthcare Providers: GravelBags.it  This test is not yet approved or cleared by the [place] FDA and  has been authorized for detection and/or diagnosis of SARS-CoV-2 by  FDA under an Emergency Use Authorization (EUA). This EUA will remain  in effect (meaning this test can be used) for the duration of the  Covid-19 declaration under Section 564(b)(1) of the Act, 21  U.S.C. section 360bbb-3(b)(1), unless the authorization is  terminated or revoked. Performed at [facility], [place] [address], [place], [place] [address]          Radiology Studies: DG Chest 2 View  Result Date: 01/25/2020 CLINICAL DATA:  Patient with bilateral feet and leg swelling. EXAM: CHEST - 2 VIEW COMPARISON:  Chest radiograph January 25, 2020 FINDINGS: Monitoring leads overlie the patient. Stable cardiomegaly. No large area pulmonary consolidation. No pleural effusion or pneumothorax. Patchy sclerotic osseous changes throughout the axial and appendicular skeleton compatible with history of metastatic prostate cancer. IMPRESSION: Cardiomegaly. No acute cardiopulmonary process. Electronically Signed   By: Lovey Newcomer M.D.   On: 01/25/2020 18:49   ECHOCARDIOGRAM COMPLETE  Result Date: 01/26/2020    ECHOCARDIOGRAM REPORT    Patient Name:   Tony [name] [facility] Date of Exam: 01/26/2020 Medical Rec #:  [id number]      Height:       67.0 in Accession #:    [id number]     Weight:       189.0 lb Date of Birth:  Oct 22, 1930      BSA:          1.974 m Patient Age:    69 years       BP:           147/81 mmHg Patient Gender: M              HR:           72 bpm. Exam Location:  Inpatient Procedure: 2D Echo and Intracardiac Opacification Agent Indications:    CHF- Acute Systolic T73.22  History:        Patient has prior history of Echocardiogram examinations, most                 recent 03/02/2011. CAD; Risk Factors:Hypertension and                 Dyslipidemia.  Sonographer:    Mikki Santee RDCS (AE) Referring Phys: [id number] Trion  1. Left ventricular ejection fraction, by estimation, is 35 to 40%. The left ventricle has moderately decreased function. The left ventricle demonstrates global hypokinesis. There is moderate concentric left ventricular hypertrophy. Left ventricular diastolic parameters are consistent with Grade I diastolic dysfunction (impaired relaxation).  2. Right ventricular systolic function is mildly reduced. The right ventricular size is mildly enlarged. There is mildly elevated pulmonary artery systolic pressure. The estimated right ventricular systolic pressure is 62.3 mmHg.  3. The mitral valve is normal in structure. Mild mitral valve regurgitation. No evidence of mitral stenosis.  4. Tricuspid valve regurgitation is moderate.  5. The aortic valve is normal in structure. Aortic valve regurgitation is mild. No aortic stenosis is present.  6. The inferior vena cava is normal in size with greater than 50% respiratory variability, suggesting right atrial pressure of 3 mmHg. FINDINGS  Left Ventricle: Left ventricular ejection fraction, by estimation, is 35 to 40%. The left ventricle has moderately decreased function. The left ventricle demonstrates global hypokinesis. Definity contrast agent was given IV to  delineate the left ventricular endocardial borders. The left ventricular internal cavity size was normal in size. There is moderate concentric left ventricular hypertrophy. Abnormal (paradoxical) septal motion, consistent with left bundle branch block. Left ventricular diastolic parameters are consistent with Grade I diastolic dysfunction (impaired relaxation). Normal left ventricular filling pressure. Right Ventricle: The right ventricular size is mildly enlarged. No increase in right ventricular wall thickness. Right ventricular systolic function is mildly reduced. There is mildly elevated pulmonary artery systolic pressure. The tricuspid regurgitant  velocity is 3.14 m/s, and with an assumed right atrial pressure of 3 mmHg, the estimated right ventricular systolic pressure is 76.2 mmHg. Left Atrium: Left atrial size was normal in size. Right Atrium: Right atrial size was normal in size. Pericardium: There is no evidence of pericardial effusion. Mitral Valve: The mitral valve is normal in structure. There is mild thickening of the mitral valve leaflet(s). Mild mitral annular calcification. Mild mitral valve regurgitation. No evidence of mitral valve stenosis. Tricuspid Valve: The tricuspid valve is normal in structure. Tricuspid valve regurgitation is moderate . No evidence of tricuspid stenosis. Aortic Valve: The aortic valve is normal in structure. Aortic valve regurgitation is mild. No aortic stenosis is present. Pulmonic Valve: The pulmonic valve was normal in structure. Pulmonic valve regurgitation is not visualized. No evidence of pulmonic stenosis. Aorta: The aortic root is normal in size and structure. Venous: The inferior vena cava is normal in size with greater than 50% respiratory variability, suggesting right atrial pressure of 3 mmHg. IAS/Shunts: No atrial level shunt detected by color flow Doppler.  LEFT VENTRICLE PLAX 2D LVIDd:         5.20 cm  Diastology LVIDs:         4.40 cm  LV e' medial:     4.64 cm/s LV PW:         1.10 cm  LV E/e' medial:  16.5 LV IVS:  1.30 cm  LV e' lateral:   5.34 cm/s LVOT diam:     2.10 cm  LV E/e' lateral: 14.3 LV SV:         58 LV SV Index:   29 LVOT Area:     3.46 cm  RIGHT VENTRICLE RV S prime:     18.30 cm/s TAPSE (M-mode): 1.9 cm LEFT ATRIUM           Index       RIGHT ATRIUM           Index LA diam:      3.20 cm 1.62 cm/m  RA Area:     17.90 cm LA Vol (A4C): 78.9 ml 39.97 ml/m RA Volume:   47.10 ml  23.86 ml/m  AORTIC VALVE LVOT Vmax:   101.00 cm/s LVOT Vmean:  59.100 cm/s LVOT VTI:    0.168 m  AORTA Ao Root diam: 3.10 cm MITRAL VALVE                TRICUSPID VALVE MV Area (PHT): 3.08 cm     TR Peak grad:   39.4 mmHg MV Decel Time: 246 msec     TR Vmax:        314.00 cm/s MV E velocity: 76.40 cm/s MV A velocity: 135.00 cm/s  SHUNTS MV E/A ratio:  0.57         Systemic VTI:  0.17 m                             Systemic Diam: 2.10 cm Ena Dawley MD Electronically signed by Ena Dawley MD Signature Date/Time: 01/26/2020/12:57:47 PM    Final         Scheduled Meds: . abiraterone acetate  1,000 mg Oral Daily  . aspirin EC  81 mg Oral Daily  . carvedilol  6.25 mg Oral BID WC  . enoxaparin (LOVENOX) injection  30 mg Subcutaneous Q24H  . fentaNYL  1 patch Transdermal Q72H  . furosemide  40 mg Intravenous BID  . isosorbide-hydrALAZINE  1 tablet Oral BID  . predniSONE  5 mg Oral BID WC  . sodium chloride flush  3 mL Intravenous Q12H  . tamsulosin  0.4 mg Oral BID   Continuous Infusions: . sodium chloride    . cefTRIAXone (ROCEPHIN)  IV Stopped (01/26/20 2201)     LOS: 1 day       Hosie Poisson, MD [facility]   To contact the attending provider between 7A-7P or the covering provider during after hours 7P-7A, please log into the web site [email or web address] and access using universal [place] password for that web site. If you do not have the password, please call the hospital operator.  01/27/2020, 5:19 PM

## 2020-01-27 NOTE — Consult Note (Signed)
Cardiology Consultation:   Patient ID: Tony [name] MRN: [id number]; DOB: [birth year]  Admit date: 01/25/2020 Date of Consult: 01/27/2020  Primary Care Provider: [name], MD [facility] Cardiologist: Larae Grooms, MD  [facility] Electrophysiologist:  None    Patient Profile:   Tony [name] is a 86 y.o. male with a history of CAD s/p prior stenting to LAD in 2012 with occluded RCA with collaterals also noted at that time, prior stroke in 2012, hypertension, hyperlipidemia with intolerance to statins, CKD stage IV, BPH, gout, chronic anemia, and stage IV prostate cancer with metastasis to bone who is being seen today for the evaluation of CHF at the request of Dr. Karleen Hampshire.  History of Present Illness:   [name] is a 86 year old male with the above history. He has a history of CAD with NSTEMI in 12/2010 that was treated at [facility] in [place]. At that time, he was found to be in complete heart block secondary to ACS. Cardiac cath showed 100% RCA occlusion with left to right collaterals and 80% LAD stenosis. He underwent successful PTCA and stenting of LAD lesion. RCA lesion was treated medically. He also reportedly had a stroke and kidney failure (requiring 1 session of dialysis) at same time of MI. He was first seen by Dr. Irish Lack in 2018 to get established with a Cardiologist here. He was last seen by Ellen Henri, PA-C, in 08/2017 at which time he reported recently being diagnosed with stage 4 prostate cancer with metastasis to the spine. He was doing well from a cardiac standpoint.  Patient presented to the [facility] ED on 01/25/2020 for further evaluation of shortness of breath and lower extremity edema. Patient has chronic lower extremity edema but reports this has been getting significantly worse over the last week. He actually denies much shortness of breath to me. He states he had one episode of shortness of breath while walking down a hill on day of  presentation but states he was not "gasping for air." He denies any other dyspnea on exertion; however, he is not very active these days with COVID and with his knee arthritis. No orthopnea or PND. He does think he has gained a little weight but attributes this to the Boost he drinks. He does not follow a low sodium diet. No chest pain, palpitations, dizziness, or near syncope. No recent fevers or illnesses. No abnormal bleeding including hematochezia, melena, hematuria, hemoptysis.  In the ED, vitals stable. EKG showed sinus rhythm with T wave inversions in inferior leads and lead V6 which are not new. High-sensitivity troponin minimally elevated and flat at 22 >> 25. BNP elevated at 662. Chest x-ray showed cardiomegaly but no acute findings. WBC 5.7, Hgb 9.6, Plts 152. Na 141, K 4.3, Glucose 119, BUN 31, Cr 2.00. Respiratory panel negative for COVID and Influenza. Patient was admitted and started on IV Lasix. Echo showed LVEF of 35-40% with moderate LVH and global hypokinesis. RV also mildly enlarged with mildly reduced systolic function and mildly elevated PASP of 42.4 mmHg. Cardiology consulted for further evaluation.  At the time of this evaluation, patient resting comfortably in no acute distress. He reports mild improvement in lower extremity edema with IV Lasix.   Past Medical History:  Diagnosis Date   BPH associated with nocturia    flomax 0.4--> 0.8 mg trial. nocturia if has coffee. some incontinence   CAD (coronary artery disease)    LAD Stent 2012. Stroke and kidney failure (dialysis x1)  at same time of MI.    Gout    no rx. apparently 1x in past   History of stroke    no aspirin before stroke. no deficits. slurred words at time of stroke   Hyperlipidemia    Hypertension     Past Surgical History:  Procedure Laterality Date   CARDIAC SURGERY     Stint   CORONARY STENT PLACEMENT     left arm fracture s/p surgery     right leg fracture s.p surgery- screws like arm        Home Medications:  Prior to Admission medications   Medication Sig Start Date End Date Taking? Authorizing Provider  abiraterone acetate (ZYTIGA) 250 MG tablet TAKE 4 TABLETS (1,000 MG TOTAL) BY MOUTH DAILY. TAKE ON AN EMPTY STOMACH 1 HOUR BEFORE OR 2 HOURS AFTER A MEAL 01/23/20  Yes Brunetta Genera, MD  aspirin EC 81 MG tablet Take 1 tablet (81 mg total) by mouth daily. 01/29/17  Yes [name], MD  BIDIL 20-37.5 MG tablet TAKE 1 TABLET BY MOUTH TWICE DAILY Patient taking differently: Take 1 tablet by mouth in the morning and at bedtime.  05/16/19  Yes [name], MD  calcium-vitamin D (OSCAL 500/200 D-3) 500-200 MG-UNIT tablet Take 1 tablet by mouth daily with breakfast. 08/09/17  Yes Brunetta Genera, MD  fentaNYL (DURAGESIC) 25 MCG/HR Place 1 patch onto the skin every 3 (three) days. 12/30/19  Yes Brunetta Genera, MD  furosemide (LASIX) 20 MG tablet Take 1 tablet (20 mg total) by mouth daily as needed for fluid or edema. 07/16/19  Yes [name], MD  metoprolol succinate (TOPROL-XL) 25 MG 24 hr tablet Take 1 tablet (25 mg total) by mouth daily. 07/16/19  Yes [name], MD  Multiple Vitamins-Minerals [facility] COMPLETE PO) Take 1 tablet by mouth daily.    Yes [provider]  nitroGLYCERIN (NITROSTAT) 0.4 MG SL tablet Place 1 tablet (0.4 mg total) under the tongue every 5 (five) minutes as needed for chest pain (3 maximum before seeking care). 11/17/16  Yes Marin Olp, MD  Omega-3 1000 MG CAPS Take 1 capsule by mouth daily.    Yes [provider]  oxyCODONE (OXY IR/ROXICODONE) 5 MG immediate release tablet Take 1 tablet (5 mg total) by mouth every 4 (four) hours as needed for severe pain. 10/17/19  Yes Brunetta Genera, MD  predniSONE (DELTASONE) 5 MG tablet Take 1 tablet (5 mg total) by mouth 2 (two) times daily with a meal. 05/23/19 02/17/20 Yes Brunetta Genera, MD  senna-docusate (SENNA S) 8.6-50 MG tablet Take 2 tablets by mouth  2 (two) times daily. 04/17/19  Yes Brunetta Genera, MD  tamsulosin (FLOMAX) 0.4 MG CAPS capsule TAKE 1 CAPSULE BY MOUTH TWICE DAILY Patient taking differently: Take 0.4 mg by mouth in the morning and at bedtime.  06/17/19  Yes [name], MD  vitamin B-12 (CYANOCOBALAMIN) 1000 MCG tablet Take 1 tablet (1,000 mcg total) by mouth daily. 07/22/17  Yes Mikhail, Velta Addison, DO  Vitamin D, Ergocalciferol, (DRISDOL) 1.25 MG (50000 UNIT) CAPS capsule Take 1 capsule Monday, Wednesday and Friday. 07/04/19  Yes Brunetta Genera, MD  gabapentin (NEURONTIN) 100 MG capsule Take 2 capsules (200 mg total) by mouth at bedtime. Patient not taking: Reported on 10/24/2019 09/23/19   Sandi Mealy E., PA-C  polyethylene glycol [facility]) packet Take 17 g by mouth daily. Patient not taking: Reported on 01/26/2020 09/06/17   [name],  Cloria Spring, MD  predniSONE (DELTASONE) 5 MG tablet 6 tab x 1 day, 5 tab x 1 day, 4 tab x 1 day, 3 tab x 1 day, 2 tab x 1 day, 1 tab x 1 day, stop Patient not taking: Reported on 10/24/2019 09/23/19   Harle Stanford., PA-C  valACYclovir (VALTREX) 500 MG tablet Take 1 tablet (500 mg total) by mouth 3 (three) times daily. Patient not taking: Reported on 10/24/2019 09/23/19   Harle Stanford., PA-C    Inpatient Medications: Scheduled Meds:  abiraterone acetate  1,000 mg Oral Daily   aspirin EC  81 mg Oral Daily   enoxaparin (LOVENOX) injection  30 mg Subcutaneous Q24H   fentaNYL  1 patch Transdermal Q72H   furosemide  20 mg Intravenous BID   isosorbide-hydrALAZINE  1 tablet Oral BID   metoprolol succinate  25 mg Oral Daily   predniSONE  5 mg Oral BID WC   sodium chloride flush  3 mL Intravenous Q12H   tamsulosin  0.4 mg Oral BID   Continuous Infusions:  sodium chloride     cefTRIAXone (ROCEPHIN)  IV Stopped (01/26/20 2201)   PRN Meds: sodium chloride, acetaminophen, nitroGLYCERIN, ondansetron (ZOFRAN) IV, oxyCODONE, polyethylene glycol, sodium chloride flush  Allergies:      Allergies  Allergen Reactions   Gabapentin Other (See Comments)    Patient states caused hallucinations     Social History:   Social History   Socioeconomic History   Marital status: Married    Spouse name: Not on file   Number of children: Not on file   Years of education: Not on file   Highest education level: Not on file  Occupational History   Not on file  Tobacco Use   Smoking status: Former Smoker    Packs/day: 0.50    Years: 1.00    Pack years: 0.50    Types: Cigarettes    Quit date: 05/01/1952    Years since quitting: 67.7   Smokeless tobacco: Never Used  Vaping Use   Vaping Use: Never used  Substance and Sexual Activity   Alcohol use: No   Drug use: No   Sexual activity: Not on file  Other Topics Concern   Not on file  Social History Narrative   Married- lives separate from wife. 2 children. Daughter passed from heart attack.       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 in community afterwards in [employer] Strain:    Difficulty of Paying Living Expenses: Not on [employer] Insecurity:    Worried About Charity fundraiser in the Last Year: Not on file   [employer] of Food in the Last Year: Not on file  Transportation Needs:    Film/video editor (Medical): Not on file   Lack of Transportation (Non-Medical): Not on file  Physical Activity:    Days of Exercise per Week: Not on file   Minutes of Exercise per Session: Not on file  Stress:    Feeling of Stress : Not on file  Social Connections:    Frequency of Communication with Friends and Family: Not on file   Frequency of Social Gatherings with Friends and Family: Not on file   Attends Religious Services: Not on file   Active Member of Clubs or Organizations: Not on file   Attends Archivist Meetings: Not on file   Marital Status: Not on file  Intimate Partner Violence:    Fear of Current or Ex-Partner: Not on  file   Emotionally Abused: Not on file   Physically Abused: Not on file   Sexually Abused: Not on file    Family History:    Family History  Problem Relation Age of Onset   Breast cancer Mother    Heart disease Father    Heart disease Brother        x2   Prostate cancer Brother      ROS:  Please see the history of present illness.  Review of Systems  Constitutional: Negative for fever.  HENT: Negative for congestion.   Respiratory: Positive for shortness of breath. Negative for cough and hemoptysis.   Cardiovascular: Positive for leg swelling. Negative for chest pain, orthopnea and claudication.  Gastrointestinal: Negative for abdominal pain, blood in stool and melena.  Genitourinary: Negative for hematuria.  Musculoskeletal: Positive for joint pain (chronic knee pain).  Neurological: Negative for dizziness and loss of consciousness.  Endo/Heme/Allergies: Does not bruise/bleed easily.  Psychiatric/Behavioral: Negative for substance abuse.   Physical Exam/Data:   Vitals:   01/26/20 1425 01/26/20 2210 01/27/20 0151 01/27/20 0500  BP:  136/71 118/61   Pulse:  81 74   Resp:  20 16   Temp: 97.7 F (36.5 C) 98.4 F (36.9 C) 98.3 F (36.8 C)   TempSrc: Oral     SpO2:  98% 98%   Weight:    83.7 kg  Height:        Intake/Output Summary (Last 24 hours) at 01/27/2020 0914 Last data filed at 01/27/2020 0831 Gross per 24 hour  Intake 100.07 ml  Output 1550 ml  Net -1449.93 ml   Last 3 Weights 01/27/2020 01/25/2020 12/16/2019  Weight (lbs) 184 lb 8.4 oz 189 lb 189 lb 11.2 oz  Weight (kg) 83.7 kg 85.73 kg 86.047 kg     Body mass index is 28.9 kg/m.  General: 86 y.o. male resting comfortably in no acute distress.  HEENT: Normocephalic and atraumatic. Sclera clear.  Neck: Supple. No carotid bruits. No JVD. Heart: RRR. Distinct S1 and S2. No murmurs, gallops, or rubs.  Lungs: No increased work of breathing. Clear to ausculation bilaterally. No wheezes, rhonchi, or  rales.  Abdomen: Soft, obese, and non-tender to palpation. Bowel sounds present. MSK: Normal strength and tone for age. Extremities: 2+ pitting edema of bilateral lower extremities. Skin: Warm and dry. Neuro: Alert and oriented x3. No focal deficits. Psych: Normal affect. Responds appropriately.  EKG:  The EKG was personally reviewed and demonstrates: Normal sinus rhythm,r ate 88 bpm, with T wave inversions in inferior leads and lead V6 which are not new  Telemetry:  Telemetry was personally reviewed and demonstrates: Normal sinus rhythm with rates in the 70's to 80's.  Relevant CV Studies:  Echocardiogram 01/26/2020: Impressions: 1. Left ventricular ejection fraction, by estimation, is 35 to 40%. The  left ventricle has moderately decreased function. The left ventricle  demonstrates global hypokinesis. There is moderate concentric left  ventricular hypertrophy. Left ventricular  diastolic parameters are consistent with Grade I diastolic dysfunction  (impaired relaxation).  2. Right ventricular systolic function is mildly reduced. The right  ventricular size is mildly enlarged. There is mildly elevated pulmonary  artery systolic pressure. The estimated right ventricular systolic  pressure is 19.4 mmHg.  3. The mitral valve is normal in structure. Mild mitral valve  regurgitation. No evidence of mitral stenosis.  4. Tricuspid valve regurgitation is moderate.  5. The aortic  valve is normal in structure. Aortic valve regurgitation is  mild. No aortic stenosis is present.  6. The inferior vena cava is normal in size with greater than 50%  respiratory variability, suggesting right atrial pressure of 3 mmHg.   Laboratory Data:  High Sensitivity Troponin:   Recent Labs  Lab 01/25/20 1817 01/25/20 2014  TROPONINIHS 22* 25*     Chemistry Recent Labs  Lab 01/25/20 1633 01/26/20 0452 01/27/20 0454  NA 141 140 138  K 4.3 4.0 3.9  CL 104 104 103  CO2 28 27 27   GLUCOSE 119*  99 129*  BUN 31* 29* 32*  CREATININE 2.00* 2.01* 2.22*  CALCIUM 7.8* 7.9* 7.0*  GFRNONAA 29* 29* 26*  GFRAA 34* 34* 30*  ANIONGAP 9 9 8     Recent Labs  Lab 01/25/20 1812  PROT 6.1*  ALBUMIN 3.3*  AST 23  ALT 13  ALKPHOS 188*  BILITOT 0.9   Hematology Recent Labs  Lab 01/25/20 1633 01/26/20 0452  WBC 5.7  --   RBC 2.93* 2.88*  HGB 9.6*  --   HCT 31.3*  --   MCV 106.8*  --   MCH 32.8  --   MCHC 30.7  --   RDW 15.6*  --   PLT 152  --    BNP Recent Labs  Lab 01/25/20 1633  BNP 662.0*    DDimer No results for input(s): DDIMER in the last 168 hours.   Radiology/Studies:  DG Chest 2 View  Result Date: 01/25/2020 CLINICAL DATA:  Patient with bilateral feet and leg swelling. EXAM: CHEST - 2 VIEW COMPARISON:  Chest radiograph January 25, 2020 FINDINGS: Monitoring leads overlie the patient. Stable cardiomegaly. No large area pulmonary consolidation. No pleural effusion or pneumothorax. Patchy sclerotic osseous changes throughout the axial and appendicular skeleton compatible with history of metastatic prostate cancer. IMPRESSION: Cardiomegaly. No acute cardiopulmonary process. Electronically Signed   By: Lovey Newcomer M.D.   On: 01/25/2020 18:49   ECHOCARDIOGRAM COMPLETE  Result Date: 01/26/2020    ECHOCARDIOGRAM REPORT   Patient Name:   RAESHAWN VO [facility] Date of Exam: 01/26/2020 Medical Rec #:  [id number]      Height:       67.0 in Accession #:    [id number]     Weight:       189.0 lb Date of Birth:  1930-08-05      BSA:          1.974 m Patient Age:    35 years       BP:           147/81 mmHg Patient Gender: M              HR:           72 bpm. Exam Location:  Inpatient Procedure: 2D Echo and Intracardiac Opacification Agent Indications:    CHF- Acute Systolic Q75.91  History:        Patient has prior history of Echocardiogram examinations, most                 recent 03/02/2011. CAD; Risk Factors:Hypertension and                 Dyslipidemia.  Sonographer:    Mikki Santee RDCS  (AE) Referring Phys: [id number] [place]  1. Left ventricular ejection fraction, by estimation, is 35 to 40%. The left ventricle has moderately decreased function. The left ventricle demonstrates global hypokinesis. There is moderate concentric  left ventricular hypertrophy. Left ventricular diastolic parameters are consistent with Grade I diastolic dysfunction (impaired relaxation).  2. Right ventricular systolic function is mildly reduced. The right ventricular size is mildly enlarged. There is mildly elevated pulmonary artery systolic pressure. The estimated right ventricular systolic pressure is 29.9 mmHg.  3. The mitral valve is normal in structure. Mild mitral valve regurgitation. No evidence of mitral stenosis.  4. Tricuspid valve regurgitation is moderate.  5. The aortic valve is normal in structure. Aortic valve regurgitation is mild. No aortic stenosis is present.  6. The inferior vena cava is normal in size with greater than 50% respiratory variability, suggesting right atrial pressure of 3 mmHg. FINDINGS  Left Ventricle: Left ventricular ejection fraction, by estimation, is 35 to 40%. The left ventricle has moderately decreased function. The left ventricle demonstrates global hypokinesis. Definity contrast agent was given IV to delineate the left ventricular endocardial borders. The left ventricular internal cavity size was normal in size. There is moderate concentric left ventricular hypertrophy. Abnormal (paradoxical) septal motion, consistent with left bundle branch block. Left ventricular diastolic parameters are consistent with Grade I diastolic dysfunction (impaired relaxation). Normal left ventricular filling pressure. Right Ventricle: The right ventricular size is mildly enlarged. No increase in right ventricular wall thickness. Right ventricular systolic function is mildly reduced. There is mildly elevated pulmonary artery systolic pressure. The tricuspid regurgitant  velocity  is 3.14 m/s, and with an assumed right atrial pressure of 3 mmHg, the estimated right ventricular systolic pressure is 37.1 mmHg. Left Atrium: Left atrial size was normal in size. Right Atrium: Right atrial size was normal in size. Pericardium: There is no evidence of pericardial effusion. Mitral Valve: The mitral valve is normal in structure. There is mild thickening of the mitral valve leaflet(s). Mild mitral annular calcification. Mild mitral valve regurgitation. No evidence of mitral valve stenosis. Tricuspid Valve: The tricuspid valve is normal in structure. Tricuspid valve regurgitation is moderate . No evidence of tricuspid stenosis. Aortic Valve: The aortic valve is normal in structure. Aortic valve regurgitation is mild. No aortic stenosis is present. Pulmonic Valve: The pulmonic valve was normal in structure. Pulmonic valve regurgitation is not visualized. No evidence of pulmonic stenosis. Aorta: The aortic root is normal in size and structure. Venous: The inferior vena cava is normal in size with greater than 50% respiratory variability, suggesting right atrial pressure of 3 mmHg. IAS/Shunts: No atrial level shunt detected by color flow Doppler.  LEFT VENTRICLE PLAX 2D LVIDd:         5.20 cm  Diastology LVIDs:         4.40 cm  LV e' medial:    4.64 cm/s LV PW:         1.10 cm  LV E/e' medial:  16.5 LV IVS:        1.30 cm  LV e' lateral:   5.34 cm/s LVOT diam:     2.10 cm  LV E/e' lateral: 14.3 LV SV:         58 LV SV Index:   29 LVOT Area:     3.46 cm  RIGHT VENTRICLE RV S prime:     18.30 cm/s TAPSE (M-mode): 1.9 cm LEFT ATRIUM           Index       RIGHT ATRIUM           Index LA diam:      3.20 cm 1.62 cm/m  RA Area:     17.90 cm  LA Vol (A4C): 78.9 ml 39.97 ml/m RA Volume:   47.10 ml  23.86 ml/m  AORTIC VALVE LVOT Vmax:   101.00 cm/s LVOT Vmean:  59.100 cm/s LVOT VTI:    0.168 m  AORTA Ao Root diam: 3.10 cm MITRAL VALVE                TRICUSPID VALVE MV Area (PHT): 3.08 cm     TR Peak grad:    39.4 mmHg MV Decel Time: 246 msec     TR Vmax:        314.00 cm/s MV E velocity: 76.40 cm/s MV A velocity: 135.00 cm/s  SHUNTS MV E/A ratio:  0.57         Systemic VTI:  0.17 m                             Systemic Diam: 2.10 cm Ena Dawley MD Electronically signed by Ena Dawley MD Signature Date/Time: 01/26/2020/12:57:47 PM    Final     New York Heart Association (NYHA) Functional Class NYHA Class II  Assessment and Plan:   New Onset Systolic CHF - BNP elevated in the 600's.  - Chest x-ray showed cardiomegaly but no edema.  - Echo showed LVEF of 35-40% (down from around 50% at time of MI) with moderate LVH and global hypokinesis. RV also mildly enlarged with mildly reduced systolic function and mildly elevated PASP of 42.4 mmHg. - Currently on IV Lasix 20mg  twice daily. Documented urinary output of 2 L in the past 24 hours and net negative 3.6 L this admission. Weight down 4.5 lbs since admission. Weight actually lower than it was at last office visit with Oncology. Creatinine slightly higher today but overall stable. - Patient has significant lower extremity edema but lungs clear.  - Will continue current dose of Lasix for now.  - Continue Toprol-XL 25mg  daily and Bidil 20-37.5mg  twice daily.  - NO ACEi/ARB/ARNI or MRA given renal function.  - Continue to monitor daily weights, strict I/O's, and renal function.  - Given patient's advanced age, metastatic prostate cancer, and CKD with baseline creatinine around 2.0, I would favor medical therapy at this time compared to ischemic evaluation especially considering patient denies any chest pain. Will discuss with MD.  Demand Ischemia CAD - History of NSTEMI in 2012 s/p stenting to LAD. Also noted to have occluded RCA with collaterals at that time. - High-sensitivity troponin minimally elevated and flat at 22 >> 25. Not consistent with ACS. Suspect demand ischemia in setting of CHF and chronic anemia. - EKG shows no acute ischemic  changes.  - No angina. - Continue aspirin and beta-blocker. Intolerant to statin.  Hypertension - BP mildly elevated yesterday but well controlled this morning. - Continue Toprol-XL and Bidil as above. - Of note, patient on Prednisone so this may be causing elevated BP.  Hyperlipidemia - No lipid panel in our system.  - Intolerant to statins in the past due to mylagias.  - Given age and metastatic cancer, don't think we need to be overly aggressive in treating this.   CKD Stage IV - Creatinine 2.00 on admission and 2.22 today after diuresis. Baseline around 1.8 to 2.1. - Continue to monitor closely with diuresis.   Elevated Alkaline Phosphatase - Chronic. Due to bone metastases.   Chronic Anemia - Hemoglobin 9.6 on admission. - Iron slightly low. Ferritin high. TIBC low. - Vitamin B12 high.  - Folate normal. -  Management per primary team.  Metastatic Prostate Cancer - Followed by Dr. [name] as outpatient.    For questions or updates, please contact [place] Please consult [email or web address] for contact info under    Signed, Darreld Mclean, PA-C  01/27/2020 9:14 AM

## 2020-01-27 NOTE — Consult Note (Signed)
Consultation Note Date: 01/27/2020   Patient Name: Tony [name]  DOB: 1931-02-11  MRN: [id number]  Age / Sex: 86 y.o., male  PCP: [name], MD Referring Physician: Hosie Poisson, MD  Reason for Consultation: Establishing goals of care  HPI/Patient Profile: 86 y.o. male  with past medical history of metastatic prostate cancer to bone, CAD s/p MI with stent placement 2012, CKD stage IIIB, HTN, anemia admitted on 01/25/2020 with progressive bilateral lower extremity edema for several months. Followed by Dr. [name] for metastatic prostate cancer on Zytiga. Hospital admission for acute on chronic CHF found to have EF 35-40% receiving lasix. Palliative medicine consultation for goals of care.   Clinical Assessment and Goals of Care:  I have reviewed medical records, discussed with care team, and met with patient at bedside to discuss goals of care. Tauheed is awake, alert, oriented and able to participate in discussion.   I introduced Palliative Medicine as specialized medical care for people living with serious illness. It focuses on providing relief from the symptoms and stress of a serious illness. The goal is to improve quality of life for both the patient and the family.  Hosea lives with a roommate. He has great support from his daughter, [name], her husband and grandchildren. [name] checks in daily. Prior to admission, using a walker for ambulation. Appetite has been fair. Only complains of bilateral knee pain secondary to arthritis. Denies cancer related pain.   Discussed events leading up to admission and course of hospitalization including diagnoses and interventions. Discussed pending cardiology consultation.   I attempted to elicit values and goals of care important to the patient. He is willing to stay hospitalized as long as necessary to stabilize. He is planning to continue oncology follow-up but does  speak of majority of his problems (weakness, swelling, and arthritic pain) starting since starting the Zytiga. Encouraged ongoing discussions with his oncologist about options.   Advanced directives, concepts specific to code status, artifical feeding and hydration were discussed. He tells me his daughter, [name] "takes care of all that." He is unsure if there is a documented living will/POA. Introduced and discussed AD packet and MOST form with patient. Encouraged he discuss with his daughter when available. Patient makes comments that he has 'lived a good life' and blessed to have lived this long. It does not sound as if he would desire prolonged heroic measures if EOL is imminent. Encouraged discussions with his daughter while he is cognizant to make decisions and share his wishes. AD packet and MOST form left at bedside for him and daughter to discuss including PMT contact information.   SUMMARY OF RECOMMENDATIONS    Initial palliative discussion. Ongoing palliative discussions pending clinical course.  Continue full code/full scope treatment.  AD packet and MOST form left at bedside for patient to further discuss with his daughter. PMT contact information given.  Continue outpatient oncology follow-up.  Patient willing to remain hospitalized as long as necessary to stabilize. Hopeful to return home following hospitalization. Great family support.  Code Status/Advance Care Planning:  Full code  Symptom Management:   Per attending  Palliative Prophylaxis:   Aspiration, Delirium Protocol, Frequent Pain Assessment and Oral Care  Additional Recommendations (Limitations, Scope, Preferences):  Full Scope Treatment  Psycho-social/Spiritual:   Desire for further Chaplaincy support: yes  Additional Recommendations: Caregiving  Support/Resources  Prognosis:   Unable to determine  Discharge Planning: To Be Determined      Primary Diagnoses: Present on Admission: . Chronic  kidney disease, stage 3b . Essential hypertension . Malignant neoplasm of prostate metastatic to bone ([place]) . Bilateral leg edema   I have reviewed the medical record, interviewed the patient and family, and examined the patient. The following aspects are pertinent.  Past Medical History:  Diagnosis Date  . BPH associated with nocturia    flomax 0.4--> 0.8 mg trial. nocturia if has coffee. some incontinence  . CAD (coronary artery disease)    LAD Stent 2012. 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   . Gout    no rx. apparently 1x in past  . History of stroke    no aspirin before stroke. no deficits. slurred words at time of stroke  . Hyperlipidemia   . Hypertension    Social History   Socioeconomic History  . Marital status: Married    Spouse name: Not on file  . Number of children: Not on file  . Years of education: Not on file  . Highest education level: Not on file  Occupational History  . Not on file  Tobacco Use  . Smoking status: Former Smoker    Packs/day: 0.50    Years: 1.00    Pack years: 0.50    Types: Cigarettes    Quit date: 05/01/1952    Years since quitting: 67.7  . Smokeless tobacco: Never Used  Vaping Use  . Vaping Use: Never used  Substance and Sexual Activity  . Alcohol use: No  . Drug use: No  . Sexual activity: Not on file  Other Topics Concern  . Not on file  Social History Narrative   Married- lives separate from wife. 2 children. Daughter passed from heart attack.       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 Difficulty of Paying Living Expenses: Not on file  Food Insecurity:   . Worried About Charity fundraiser in the Last Year: Not on file  . Ran Out of Food in the Last Year: Not on file  Transportation Needs:   . Lack of Transportation (Medical): Not on file  . Lack of Transportation (Non-Medical): Not on file    Physical Activity:   . Days of Exercise per Week: Not on file  . Minutes of Exercise per Session: Not on file  Stress:   . Feeling of Stress : Not on file  Social Connections:   . Frequency of Communication with Friends and Family: Not on file  . Frequency of Social Gatherings with Friends and Family: Not on file  . Attends Religious Services: Not on file  . Active Member of Clubs or Organizations: Not on file  . Attends Archivist Meetings: Not on file  . Marital Status: Not on file   Family History  Problem Relation Age of Onset  . Breast cancer Mother   . Heart disease Father   . Heart disease Brother  x2  . Prostate cancer Brother    Scheduled Meds: . abiraterone acetate  1,000 mg Oral Daily  . aspirin EC  81 mg Oral Daily  . enoxaparin (LOVENOX) injection  30 mg Subcutaneous Q24H  . fentaNYL  1 patch Transdermal Q72H  . furosemide  20 mg Intravenous BID  . isosorbide-hydrALAZINE  1 tablet Oral BID  . metoprolol succinate  25 mg Oral Daily  . predniSONE  5 mg Oral BID WC  . sodium chloride flush  3 mL Intravenous Q12H  . tamsulosin  0.4 mg Oral BID   Continuous Infusions: . sodium chloride    . cefTRIAXone (ROCEPHIN)  IV Stopped (01/26/20 2201)   PRN Meds:.sodium chloride, acetaminophen, nitroGLYCERIN, ondansetron (ZOFRAN) IV, oxyCODONE, polyethylene glycol, sodium chloride flush Medications Prior to Admission:  Prior to Admission medications   Medication Sig Start Date End Date Taking? Authorizing Provider  abiraterone acetate (ZYTIGA) 250 MG tablet TAKE 4 TABLETS (1,000 MG TOTAL) BY MOUTH DAILY. TAKE ON AN EMPTY STOMACH 1 HOUR BEFORE OR 2 HOURS AFTER A MEAL 01/23/20  Yes Brunetta Genera, MD  aspirin EC 81 MG tablet Take 1 tablet (81 mg total) by mouth daily. 01/29/17  Yes [name], MD  BIDIL 20-37.5 MG tablet TAKE 1 TABLET BY MOUTH TWICE DAILY Patient taking differently: Take 1 tablet by mouth in the morning and at bedtime.  05/16/19   Yes [name], MD  calcium-vitamin D (OSCAL 500/200 D-3) 500-200 MG-UNIT tablet Take 1 tablet by mouth daily with breakfast. 08/09/17  Yes Brunetta Genera, MD  fentaNYL (DURAGESIC) 25 MCG/HR Place 1 patch onto the skin every 3 (three) days. 12/30/19  Yes Brunetta Genera, MD  furosemide (LASIX) 20 MG tablet Take 1 tablet (20 mg total) by mouth daily as needed for fluid or edema. 07/16/19  Yes [name], MD  metoprolol succinate (TOPROL-XL) 25 MG 24 hr tablet Take 1 tablet (25 mg total) by mouth daily. 07/16/19  Yes [name], MD  Multiple Vitamins-Minerals [facility] COMPLETE PO) Take 1 tablet by mouth daily.    Yes [provider]  nitroGLYCERIN (NITROSTAT) 0.4 MG SL tablet Place 1 tablet (0.4 mg total) under the tongue every 5 (five) minutes as needed for chest pain (3 maximum before seeking care). 11/17/16  Yes Marin Olp, MD  Omega-3 1000 MG CAPS Take 1 capsule by mouth daily.    Yes [provider]  oxyCODONE (OXY IR/ROXICODONE) 5 MG immediate release tablet Take 1 tablet (5 mg total) by mouth every 4 (four) hours as needed for severe pain. 10/17/19  Yes Brunetta Genera, MD  predniSONE (DELTASONE) 5 MG tablet Take 1 tablet (5 mg total) by mouth 2 (two) times daily with a meal. 05/23/19 02/17/20 Yes Brunetta Genera, MD  senna-docusate (SENNA S) 8.6-50 MG tablet Take 2 tablets by mouth 2 (two) times daily. 04/17/19  Yes Brunetta Genera, MD  tamsulosin (FLOMAX) 0.4 MG CAPS capsule TAKE 1 CAPSULE BY MOUTH TWICE DAILY Patient taking differently: Take 0.4 mg by mouth in the morning and at bedtime.  06/17/19  Yes [name], MD  vitamin B-12 (CYANOCOBALAMIN) 1000 MCG tablet Take 1 tablet (1,000 mcg total) by mouth daily. 07/22/17  Yes Mikhail, Velta Addison, DO  Vitamin D, Ergocalciferol, (DRISDOL) 1.25 MG (50000 UNIT) CAPS capsule Take 1 capsule Monday, Wednesday and Friday. 07/04/19  Yes Brunetta Genera, MD  gabapentin (NEURONTIN) 100 MG capsule  Take 2 capsules (200 mg total) by  mouth at bedtime. Patient not taking: Reported on 10/24/2019 09/23/19   Sandi Mealy E., PA-C  polyethylene glycol [facility]) packet Take 17 g by mouth daily. Patient not taking: Reported on 01/26/2020 09/06/17   Brunetta Genera, MD  predniSONE (DELTASONE) 5 MG tablet 6 tab x 1 day, 5 tab x 1 day, 4 tab x 1 day, 3 tab x 1 day, 2 tab x 1 day, 1 tab x 1 day, stop Patient not taking: Reported on 10/24/2019 09/23/19   Harle Stanford., PA-C  valACYclovir (VALTREX) 500 MG tablet Take 1 tablet (500 mg total) by mouth 3 (three) times daily. Patient not taking: Reported on 10/24/2019 09/23/19   Harle Stanford., PA-C   Allergies  Allergen Reactions  . Gabapentin Other (See Comments)    Patient states caused hallucinations    Review of Systems  Constitutional: Positive for activity change.  Cardiovascular: Positive for leg swelling.   Physical Exam Vitals and nursing note reviewed.  Constitutional:      General: He is awake.  HENT:     Head: Normocephalic and atraumatic.  Pulmonary:     Effort: No tachypnea, accessory muscle usage or respiratory distress.  Skin:    General: Skin is warm and dry.  Neurological:     Mental Status: He is alert and oriented to person, place, and time.  Psychiatric:        Mood and Affect: Mood normal.        Speech: Speech normal.        Behavior: Behavior normal.        Cognition and Memory: Cognition normal.    Vital Signs: BP 118/61 (BP Location: Right Arm)   Pulse 74   Temp 98.3 F (36.8 C)   Resp 16   Ht $R'5\' 7"'VS$  (1.702 m)   Wt 83.7 kg   SpO2 98%   BMI 28.90 kg/m  Pain Scale: 0-10   Pain Score: 0-No pain   SpO2: SpO2: 98 % O2 Device:SpO2: 98 % O2 Flow Rate: .   IO: Intake/output summary:   Intake/Output Summary (Last 24 hours) at 01/27/2020 0914 Last data filed at 01/27/2020 0831 Gross per 24 hour  Intake 100.07 ml  Output 1550 ml  Net -1449.93 ml    LBM: Last BM Date: 01/26/20 Baseline Weight: Weight:  85.7 kg Most recent weight: Weight: 83.7 kg     Palliative Assessment/Data: PPS 60%    Time Total: 35min Greater than 50%  of this time was spent counseling and coordinating care related to the above assessment and plan.  Signed by:  Ihor Dow, DNP, FNP-C Palliative Medicine Team  Phone: [phone] Fax: [phone]   Please contact Palliative Medicine Team phone at [phone] for questions and concerns.  For individual provider: See Shea Evans

## 2020-01-28 DIAGNOSIS — R609 Edema, unspecified: Secondary | ICD-10-CM

## 2020-01-28 DIAGNOSIS — I5023 Acute on chronic systolic (congestive) heart failure: Secondary | ICD-10-CM

## 2020-01-28 DIAGNOSIS — R600 Localized edema: Secondary | ICD-10-CM

## 2020-01-28 DIAGNOSIS — N1832 Chronic kidney disease, stage 3b: Secondary | ICD-10-CM

## 2020-01-28 LAB — BASIC METABOLIC PANEL
Anion gap: 10 (ref 5–15)
BUN: 35 mg/dL — ABNORMAL HIGH (ref 8–23)
CO2: 28 mmol/L (ref 22–32)
Calcium: 6.8 mg/dL — ABNORMAL LOW (ref 8.9–10.3)
Chloride: 102 mmol/L (ref 98–111)
Creatinine, Ser: 2.18 mg/dL — ABNORMAL HIGH (ref 0.61–1.24)
GFR calc Af Amer: 30 mL/min — ABNORMAL LOW (ref >60)
GFR calc non Af Amer: 26 mL/min — ABNORMAL LOW (ref >60)
Glucose, Bld: 125 mg/dL — ABNORMAL HIGH (ref 70–99)
Potassium: 3.9 mmol/L (ref 3.5–5.1)
Sodium: 140 mmol/L (ref 135–145)

## 2020-01-28 LAB — CBC
HCT: 29.3 % — ABNORMAL LOW (ref 39.0–52.0)
Hemoglobin: 9.2 g/dL — ABNORMAL LOW (ref 13.0–17.0)
MCH: 33.2 pg (ref 26.0–34.0)
MCHC: 31.4 g/dL (ref 30.0–36.0)
MCV: 105.8 fL — ABNORMAL HIGH (ref 80.0–100.0)
Platelets: 154 10*3/uL (ref 150–400)
RBC: 2.77 MIL/uL — ABNORMAL LOW (ref 4.22–5.81)
RDW: 15.2 % (ref 11.5–15.5)
WBC: 5.4 10*3/uL (ref 4.0–10.5)
nRBC: 0.4 % — ABNORMAL HIGH (ref 0.0–0.2)

## 2020-01-28 LAB — URINE CULTURE: Culture: >=100000 — AB

## 2020-01-28 MED ORDER — CARVEDILOL 12.5 MG PO TABS
12.5000 mg | ORAL_TABLET | Freq: Two times a day (BID) | ORAL | Status: DC
  Administered 2020-01-28 – 2020-01-31 (×7): 12.5 mg via ORAL
  Filled 2020-01-28 (×7): qty 1

## 2020-01-28 NOTE — Progress Notes (Signed)
Progress Note  Patient Name: Tony [name] Date of Encounter: 01/28/2020  [facility] Cardiologist: Larae Grooms, MD   Subjective   Patient put out 1.5L urine overnight. He is feeling better this morning. He said he was up and about the room yesterday and felt minimally sob. No chest pain. Still volume up on exam.   Inpatient Medications    Scheduled Meds: . abiraterone acetate  1,000 mg Oral Daily  . aspirin EC  81 mg Oral Daily  . carvedilol  6.25 mg Oral BID WC  . enoxaparin (LOVENOX) injection  30 mg Subcutaneous Q24H  . fentaNYL  1 patch Transdermal Q72H  . furosemide  40 mg Intravenous BID  . isosorbide-hydrALAZINE  1 tablet Oral BID  . predniSONE  5 mg Oral BID WC  . sodium chloride flush  3 mL Intravenous Q12H  . tamsulosin  0.4 mg Oral BID   Continuous Infusions: . sodium chloride    . cefTRIAXone (ROCEPHIN)  IV 1 g (01/27/20 2005)   PRN Meds: sodium chloride, acetaminophen, nitroGLYCERIN, ondansetron (ZOFRAN) IV, oxyCODONE, polyethylene glycol, sodium chloride flush   Vital Signs    Vitals:   01/27/20 1333 01/27/20 2135 01/28/20 0500 01/28/20 0629  BP: 127/67 127/74  135/74  Pulse: 86 75  72  Resp: _0 Temp: 97.9 F (36.6 C) 99 F (37.2 C)  98 F (36.7 C)  TempSrc: Oral Oral  Oral  SpO2: 100% 98%  95%  Weight:   80.9 kg   Height:        Intake/Output Summary (Last 24 hours) at 01/28/2020 0750 Last data filed at 01/27/2020 2135 Gross per 24 hour  Intake 840 ml  Output 1500 ml  Net -660 ml   Last 3 Weights 01/28/2020 01/27/2020 01/25/2020  Weight (lbs) 178 lb 5.6 oz 184 lb 8.4 oz 189 lb  Weight (kg) 80.9 kg 83.7 kg 85.73 kg      Telemetry    NSR, HR 70s - Personally Reviewed  ECG    No new - Personally Reviewed  Physical Exam   GEN: No acute distress.   Neck: No JVD Cardiac: RRR, no murmurs, rubs, or gallops.  Respiratory: Clear to auscultation bilaterally. GI: Soft, nontender, non-distended  MS: 2+ B/L edema; No  deformity. Neuro:  Nonfocal  Psych: Normal affect   Labs    High Sensitivity Troponin:   Recent Labs  Lab 01/25/20 1817 01/25/20 2014  TROPONINIHS 22* 25*      Chemistry Recent Labs  Lab 01/25/20 1633 01/25/20 1812 01/26/20 0452 01/27/20 0454 01/28/20 0344  NA   < >  --  140 138 140  K   < >  --  4.0 3.9 3.9  CL   < >  --  104 103 102  CO2   < >  --  _1 GLUCOSE   < >  --  99 129* 125*  BUN   < >  --  29* 32* 35*  CREATININE   < >  --  2.01* 2.22* 2.18*  CALCIUM   < >  --  7.9* 7.0* 6.8*  PROT  --  6.1*  --   --   --   ALBUMIN  --  3.3*  --   --   --   AST  --  23  --   --   --   ALT  --  13  --   --   --  ALKPHOS  --  188*  --   --   --   BILITOT  --  0.9  --   --   --   GFRNONAA   < >  --  29* 26* 26*  GFRAA   < >  --  34* 30* 30*  ANIONGAP   < >  --  _0 < > = values in this interval not displayed.     Hematology Recent Labs  Lab 01/25/20 1633 01/26/20 0452  WBC 5.7  --   RBC 2.93* 2.88*  HGB 9.6*  --   HCT 31.3*  --   MCV 106.8*  --   MCH 32.8  --   MCHC 30.7  --   RDW 15.6*  --   PLT 152  --     BNP Recent Labs  Lab 01/25/20 1633  BNP 662.0*     DDimer No results for input(s): DDIMER in the last 168 hours.   Radiology    ECHOCARDIOGRAM COMPLETE  Result Date: 01/26/2020    ECHOCARDIOGRAM REPORT   Patient Name:   Tony [name] Date of Exam: 01/26/2020 Medical Rec #:  [id number]      Height:       67.0 in Accession #:    [id number]     Weight:       189.0 lb Date of Birth:  01/16/1931      BSA:          1.974 m Patient Age:    51 years       BP:           147/81 mmHg Patient Gender: M              HR:           72 bpm. Exam Location:  Inpatient Procedure: 2D Echo and Intracardiac Opacification Agent Indications:    CHF- Acute Systolic W29.56  History:        Patient has prior history of Echocardiogram examinations, most                 recent 03/02/2011. CAD; Risk Factors:Hypertension and                 Dyslipidemia.  Sonographer:     Mikki Santee RDCS (AE) Referring Phys: [id number] [place]  1. Left ventricular ejection fraction, by estimation, is 35 to 40%. The left ventricle has moderately decreased function. The left ventricle demonstrates global hypokinesis. There is moderate concentric left ventricular hypertrophy. Left ventricular diastolic parameters are consistent with Grade I diastolic dysfunction (impaired relaxation).  2. Right ventricular systolic function is mildly reduced. The right ventricular size is mildly enlarged. There is mildly elevated pulmonary artery systolic pressure. The estimated right ventricular systolic pressure is 78.4 mmHg.  3. The mitral valve is normal in structure. Mild mitral valve regurgitation. No evidence of mitral stenosis.  4. Tricuspid valve regurgitation is moderate.  5. The aortic valve is normal in structure. Aortic valve regurgitation is mild. No aortic stenosis is present.  6. The inferior vena cava is normal in size with greater than 50% respiratory variability, suggesting right atrial pressure of 3 mmHg. FINDINGS  Left Ventricle: Left ventricular ejection fraction, by estimation, is 35 to 40%. The left ventricle has moderately decreased function. The left ventricle demonstrates global hypokinesis. Definity contrast agent was given IV to delineate the left ventricular endocardial borders. The left ventricular internal cavity size was normal in  size. There is moderate concentric left ventricular hypertrophy. Abnormal (paradoxical) septal motion, consistent with left bundle branch block. Left ventricular diastolic parameters are consistent with Grade I diastolic dysfunction (impaired relaxation). Normal left ventricular filling pressure. Right Ventricle: The right ventricular size is mildly enlarged. No increase in right ventricular wall thickness. Right ventricular systolic function is mildly reduced. There is mildly elevated pulmonary artery systolic pressure. The tricuspid  regurgitant  velocity is 3.14 m/s, and with an assumed right atrial pressure of 3 mmHg, the estimated right ventricular systolic pressure is 54.0 mmHg. Left Atrium: Left atrial size was normal in size. Right Atrium: Right atrial size was normal in size. Pericardium: There is no evidence of pericardial effusion. Mitral Valve: The mitral valve is normal in structure. There is mild thickening of the mitral valve leaflet(s). Mild mitral annular calcification. Mild mitral valve regurgitation. No evidence of mitral valve stenosis. Tricuspid Valve: The tricuspid valve is normal in structure. Tricuspid valve regurgitation is moderate . No evidence of tricuspid stenosis. Aortic Valve: The aortic valve is normal in structure. Aortic valve regurgitation is mild. No aortic stenosis is present. Pulmonic Valve: The pulmonic valve was normal in structure. Pulmonic valve regurgitation is not visualized. No evidence of pulmonic stenosis. Aorta: The aortic root is normal in size and structure. Venous: The inferior vena cava is normal in size with greater than 50% respiratory variability, suggesting right atrial pressure of 3 mmHg. IAS/Shunts: No atrial level shunt detected by color flow Doppler.  LEFT VENTRICLE PLAX 2D LVIDd:         5.20 cm  Diastology LVIDs:         4.40 cm  LV e' medial:    4.64 cm/s LV PW:         1.10 cm  LV E/e' medial:  16.5 LV IVS:        1.30 cm  LV e' lateral:   5.34 cm/s LVOT diam:     2.10 cm  LV E/e' lateral: 14.3 LV SV:         58 LV SV Index:   29 LVOT Area:     3.46 cm  RIGHT VENTRICLE RV S prime:     18.30 cm/s TAPSE (M-mode): 1.9 cm LEFT ATRIUM           Index       RIGHT ATRIUM           Index LA diam:      3.20 cm 1.62 cm/m  RA Area:     17.90 cm LA Vol (A4C): 78.9 ml 39.97 ml/m RA Volume:   47.10 ml  23.86 ml/m  AORTIC VALVE LVOT Vmax:   101.00 cm/s LVOT Vmean:  59.100 cm/s LVOT VTI:    0.168 m  AORTA Ao Root diam: 3.10 cm MITRAL VALVE                TRICUSPID VALVE MV Area (PHT): 3.08 cm      TR Peak grad:   39.4 mmHg MV Decel Time: 246 msec     TR Vmax:        314.00 cm/s MV E velocity: 76.40 cm/s MV A velocity: 135.00 cm/s  SHUNTS MV E/A ratio:  0.57         Systemic VTI:  0.17 m                             Systemic Diam: 2.10 cm Ena Dawley MD  Electronically signed by Ena Dawley MD Signature Date/Time: 01/26/2020/12:57:47 PM    Final     Cardiac Studies   Echo 01/26/20 1. Left ventricular ejection fraction, by estimation, is 35 to 40%. The  left ventricle has moderately decreased function. The left ventricle  demonstrates global hypokinesis. There is moderate concentric left  ventricular hypertrophy. Left ventricular  diastolic parameters are consistent with Grade I diastolic dysfunction  (impaired relaxation).  2. Right ventricular systolic function is mildly reduced. The right  ventricular size is mildly enlarged. There is mildly elevated pulmonary  artery systolic pressure. The estimated right ventricular systolic  pressure is 19.4 mmHg.  3. The mitral valve is normal in structure. Mild mitral valve  regurgitation. No evidence of mitral stenosis.  4. Tricuspid valve regurgitation is moderate.  5. The aortic valve is normal in structure. Aortic valve regurgitation is  mild. No aortic stenosis is present.  6. The inferior vena cava is normal in size with greater than 50%  respiratory variability, suggesting right atrial pressure of 3 mmHg.   Patient Profile     86 y.o. male with a history of CAD s/p prior stenting to LAD in 2012 with occluded RCA with collaterals also noted at that time, prior stroke in 2012, hypertension, hyperlipidemia with intolerance to statins, CKD stage IV, BPH, gout, chronic anemia, and stage IV prostate cancer with metastasis to bone who is being seen today for the evaluation of CHF.  Assessment & Plan    New onset systolic CHF - BNP elevated in the 600. CXR showed cardiomegaly but no edema - Echo showed LVEF 35-40% (down from 50%  at time of MI) with moderate LVH and global hypokinesis. RV also mildly enlarged with mildly reduced systolic function and mildly elevated PASP 56mHg - On lasix 275m>>increased to 4037mID - continue Bidil. Toprol was changed to coreg - No ACE/ARB/Entresto with CKD - Unsure etiology of CM, possible that prednisone and zytiga are contributing to volume retention. Possible also tx for prostate cancer can lead to cardiotoxicity - encouraged low salt diet and fluid restriction - Patient does not want cath or invasive procedures. He is not having chest pain so plan for conservative management - Overnight he put out 1.5L. net -4.1L. Weight is down 189lbs to 178lbs - creatinine stable, 2.22>2.18. continue with diuresis  CAD s/p stenting to LAD in 2012/Suspected demand ischemia - NSTEMI in 2012 with stenting to LAD also noted to have RCA with collaterals - HS troponin minimally elevated and flat, suspect demand ischemia - EKG with no ischemic changes - continue aspirin, BB. Intolerant to statin - Plan for medical management as above  HTN - Continue BB and Bidil - BP today 135/74  HLD - No statin due to intolerance due to myalgias - will order FLP  CKD stage IV - creatinine 2.0. Baseline 1.8-2.1 - continue to monitor with diuresis  Elevated alk phos - chronic from bone metastases  Chronic anemia - Hgb 9.6 on admission - Cbc this AM   For questions or updates, please contact [facility] [place] consult [email or web address] for contact info under        Signed, Ramla Hase H FNinfa MeekerA-C  01/28/2020, 7:50 AM

## 2020-01-28 NOTE — Progress Notes (Addendum)
Daily Progress Note   Patient Name: Tony [name]       Date: 01/28/2020 DOB: [birth year]  Age: 86 y.o. MRN#: [id number] Attending Physician: Patrecia Pour, MD Primary Care Physician: [name], MD Admit Date: 01/25/2020  Reason for Consultation/Follow-up: Establishing goals of care  GOC:  Received call from daughter, Tony [name]. She was not at bedside yesterday during initial palliative consultation.   Introduced role of palliative medicine.   Discussed course of hospitalization including diagnoses, interventions, plan of care. Tony [name] is hopeful to hear from Dr. [name] regarding options moving forward. She acknowledges that Tony [name] is likely contributing to heart failure/fluid retention but if medication is discontinued, his cancer could progress faster. She is hopeful to hear from Dr. [name] before he is discharged.  Tony [name] is also interested in home health services. Could he receive visits by home health RN to assist with heart failure management? Reassured her of f/u with outpatient cardiology also.   Tony [name] reviewed AD packet and MOST form that was left at bedside yesterday. She mentioned that her father stated "let me go to glory" when resuscitation was discussed. Encouraged ongoing discussions with her father while he is of sound mind to express his wishes. Encouraged consideration of limitations to care including DNR with underlying cancer and now heart failure along with age, expressing fear of poor outcomes for meaningful recovery if he required aggressive measures such as resuscitation/life support. Discussed ongoing medical management for heart failure and concept of treating the treatable. Quality of life approach. Tony [name] is appreciative of information and knows she can call PMT phone if  her and her father are interested in completing MOST form prior to discharge.   Answered questions and concerns.    Length of Stay: 2  Current Medications: Scheduled Meds:  . abiraterone acetate  1,000 mg Oral Daily  . aspirin EC  81 mg Oral Daily  . carvedilol  6.25 mg Oral BID WC  . enoxaparin (LOVENOX) injection  30 mg Subcutaneous Q24H  . fentaNYL  1 patch Transdermal Q72H  . furosemide  40 mg Intravenous BID  . isosorbide-hydrALAZINE  1 tablet Oral BID  . predniSONE  5 mg Oral BID WC  . sodium chloride flush  3 mL Intravenous Q12H  . tamsulosin  0.4 mg Oral BID    Continuous Infusions: .  sodium chloride    . cefTRIAXone (ROCEPHIN)  IV 1 g (01/27/20 2005)    PRN Meds: sodium chloride, acetaminophen, nitroGLYCERIN, ondansetron (ZOFRAN) IV, oxyCODONE, polyethylene glycol, sodium chloride flush  Physical Exam Vitals and nursing note reviewed.             Vital Signs: BP 135/74 (BP Location: Left Arm)   Pulse 72   Temp 98 F (36.7 C) (Oral)   Resp 18   Ht 5\' 7"  (1.702 m)   Wt 80.9 kg   SpO2 95%   BMI 27.93 kg/m  SpO2: SpO2: 95 % O2 Device: O2 Device: Room Air O2 Flow Rate:    Intake/output summary:   Intake/Output Summary (Last 24 hours) at 01/28/2020 0843 Last data filed at 01/27/2020 2135 Gross per 24 hour  Intake 840 ml  Output 1300 ml  Net -460 ml   LBM: Last BM Date: 01/26/20 Baseline Weight: Weight: 85.7 kg Most recent weight: Weight: 80.9 kg       Palliative Assessment/Data: PPS 60%      Patient Active Problem List   Diagnosis Date Noted  . Palliative care by specialist   . Goals of care, counseling/discussion   . Acute on chronic congestive heart failure ([place]) 01/26/2020  . Bilateral leg edema 01/26/2020  . Osteoarthritis of knee 07/16/2019  . Prostate cancer metastatic to bone ([place]) 08/07/2017  . Macrocytic anemia 07/20/2017  . AKI (acute kidney injury) ([place]) 07/20/2017  . Thrombocytopenia ([place]) 07/20/2017  . Claudication of both  lower extremities ([place]) 07/20/2017  . Anemia   . History of complete AV block 10/30/2016  . Venous insufficiency 10/30/2016  . Chronic kidney disease, stage 3b 10/30/2016  . Essential hypertension   . Hyperlipidemia   . BPH associated with nocturia   . Coronary artery disease involving native coronary artery of native heart without angina pectoris   . History of stroke   . Gout     Palliative Care Assessment & Plan   Patient Profile: 86 y.o. male  with past medical history of metastatic prostate cancer to bone, CAD s/p MI with stent placement 2012, CKD stage IIIB, HTN, anemia admitted on 01/25/2020 with progressive bilateral lower extremity edema for several months. Followed by Dr. [name] for metastatic prostate cancer on Zytiga. Hospital admission for acute on chronic CHF found to have EF 35-40% receiving lasix. Palliative medicine consultation for goals of care.   Assessment: Metastatic prostate cancer to bone New onset acute systolic and diastolic heart failure Hx of CAD s/p PCI E. Coli UTI CKD stage IIIb Anemia of chronic disease  Recommendations/Plan:  Ongoing palliative discussions pending clinical course.   Daughter hopeful for oncology consultation inpatient.   Continue current plan of care and medical management.   AD packet and MOST form reviewed with patient and daughter. Encouraged ongoing discussions and consideration of limitations to care with underlying metastatic cancer/heart failure. Daughter has PMT contact information and understands she can call PMT provider if her and her father are ready to complete MOST form prior to discharge.   May benefit from outpatient palliative f/u.   Outpatient cardiology follow-up.   Home health services. Daughter wondering if he is eligible for home health RN to assist with heart failure management.   Code Status: FULL   Code Status Orders  (From admission, onward)         Start     Ordered   01/26/20 0139  Full code   Continuous  01/26/20 0142        Code Status History    Date Active Date Inactive Code Status Order ID Comments User Context   07/20/2017 2021 07/22/2017 2144 Full Code [id number]  Gwynne Edinger, MD Inpatient   Advance Care Planning Activity       Prognosis:   Unable to determine  Discharge Planning:  To Be Determined  Care plan was discussed with daughter Tony [name]), Dr. Bonner Puna via secure chat  Thank you for allowing the Palliative Medicine Team to assist in the care of this patient.   Total Time 20 Prolonged Time Billed no      Greater than 50%  of this time was spent counseling and coordinating care related to the above assessment and plan.  Ihor Dow, DNP, FNP-C Palliative Medicine Team  Phone: [phone] Fax: [phone]  Please contact Palliative Medicine Team phone at [phone] for questions and concerns.

## 2020-01-28 NOTE — Progress Notes (Signed)
Physical Therapy Treatment Patient Details Name: Tony [name] MRN: [id number] DOB: 09/28/1930 Today's Date: 01/28/2020    History of Present Illness 86 year old male with prior h/o metastatic prostate cancer to bone, CAD, stage 3 b CKD, coronary artery disease s/p MI with stent to LAD , hypertension presents to ED for bilateral lower extremity edema and admitted for acute on chronic congestive heart failure    PT Comments    Pt assisted to bathroom and then ambulated in hallway with rollator.  Pt steady and only reports mild dyspnea upon returning to room.  Pt encouraged to ambulate with nursing staff as tolerated.  Follow Up Recommendations  No PT follow up;Supervision for mobility/OOB     Equipment Recommendations  None recommended by PT    Recommendations for Other Services       Precautions / Restrictions Precautions Precautions: Fall    Mobility  Bed Mobility Overal bed mobility: Needs Assistance Bed Mobility: Supine to Sit     Supine to sit: Min guard        Transfers Overall transfer level: Needs assistance Equipment used: Rolling walker (2 wheeled) Transfers: Sit to/from Stand Sit to Stand: Min guard         General transfer comment: min/guard for safety  Ambulation/Gait Ambulation/Gait assistance: Min guard Gait Distance (Feet): 80 Feet Assistive device: 4-wheeled walker Gait Pattern/deviations: Step-through pattern;Decreased stride length     General Gait Details: steady with RW, distance to tolerance, reports mild dyspnea end of ambulation   Stairs             Wheelchair Mobility    Modified Rankin (Stroke Patients Only)       Balance                                            Cognition Arousal/Alertness: Awake/alert Behavior During Therapy: WFL for tasks assessed/performed Overall Cognitive Status: Within Functional Limits for tasks assessed                                         Exercises      General Comments        Pertinent Vitals/Pain Pain Assessment: No/denies pain    Home Living                      Prior Function            PT Goals (current goals can now be found in the care plan section) Progress towards PT goals: Progressing toward goals    Frequency    Min 3X/week      PT Plan Current plan remains appropriate    Co-evaluation              AM-PAC PT "6 Clicks" Mobility   Outcome Measure  Help needed turning from your back to your side while in a flat bed without using bedrails?: A Little Help needed moving from lying on your back to sitting on the side of a flat bed without using bedrails?: A Little Help needed moving to and from a bed to a chair (including a wheelchair)?: A Little Help needed standing up from a chair using your arms (e.g., wheelchair or bedside chair)?: A Little Help needed to walk in hospital room?:  A Little Help needed climbing 3-5 steps with a railing? : A Little 6 Click Score: 18    End of Session Equipment Utilized During Treatment: Gait belt Activity Tolerance: Patient tolerated treatment well Patient left: with call bell/phone within reach;in chair;with family/visitor present Nurse Communication: Mobility status PT Visit Diagnosis: Other abnormalities of gait and mobility (R26.89)     Time: 1055-1110 PT Time Calculation (min) (ACUTE ONLY): 15 min  Charges:  $Gait Training: 8-22 mins                    Arlyce Dice, DPT Acute Rehabilitation Services Pager: [phone] Office: [phone]  York Ram E 01/28/2020, 1:03 PM

## 2020-01-28 NOTE — Progress Notes (Signed)
PROGRESS NOTE  TREVEON BOURCIER  CBJ:[id number] DOB: 16-Nov-1930 DOA: 01/25/2020 PCP: [name], MD   Brief Narrative: 86 year old male with prior h/o metastatic prostate cancer to bone, CAD, stage 3 b CKD,coronary artery disease s/p MI with stent to LAD,hypertension presents to ED for bilateral lower extremity edema. He was admitted to [facility] for evaluation of CHF.  Patient was started on IV Lasix the dose increased to 40 mg twice daily by cardiology.  Echocardiogram ordered showed Left ventricular ejection fraction, by estimation, is 35 to 40%. The left ventricle has moderately decreased function. The left ventricle  demonstrates global hypokinesis. There is moderate concentric left ventricular hypertrophy. Left ventricular  diastolic parameters are consistent with Grade I diastolic dysfunction (impaired relaxation).  Right ventricular systolic function is mildly reduced and there is mildly elevated pulmonary artery systolic pressure.  Assessment & Plan: Active Problems:   Essential hypertension   Coronary artery disease involving native coronary artery of native heart without angina pectoris   Chronic kidney disease, stage 3b   Prostate cancer metastatic to bone ([facility])   Acute on chronic congestive heart failure ([facility])   Bilateral leg edema   Palliative care by specialist   Goals of care, counseling/discussion   Peripheral edema  New onset acute systolic and diastolic heart failure in the setting of known coronary artery disease: Global hypokinesis argues against ischemic etiology/infarct.  - Continue lasix IV per cardiology. Remains clinically volume overloaded.  - Continue I/O, daily weights (standing preferred) - Dietitian consulted for CHF diet guidance  Elevated troponin: No chest pain, not highly suspicious for ACS.   History of coronary artery disease s/p PCI to LAD. - Continue ASA, coreg (dose increased per cardiology).  - Intolerant of statin in the past. Consider PCSK9.    HTN:  - Continue Tx as above  E. coli UTI:  - s/p 3 days ceftriaxone (pan-sensitive). Symptoms not present currently.   Stage IIIb CKD - SCr roughly stable with IV diuresis. Will continue to monitor.   Metastatic prostate cancer:  - Will discuss further treatments with Dr. [name]. Currently to receive zytiga, prednisone.  - Continue pain medications.   Anemia of chronic disease Baseline hemoglobin around 10, currently at 9.6. Anemia panel reviewed showed a low iron levels around 43. Ferritin 1208, vitamin B12 1026, adequate folate levels. - Continue to monitor  DVT prophylaxis: Lovenox Code Status: Full Family Communication: Daughter at bedside Disposition Plan:  Status is: Inpatient  Remains inpatient appropriate because:IV treatments appropriate due to intensity of illness or inability to take PO  Dispo: The patient is from: Home              Anticipated d/c is to: Home              Anticipated d/c date is: 3 days              Patient currently is not medically stable to d/c.  Consultants:   Oncology   Cardiology  Procedures:   None  Antimicrobials:  Ceftriaxone   Subjective: Waffles on issue of dyspnea, but does say he gets winded with exertion, more so during the time his feet have been swelling. Both are improved since admission. No chest pain. Feet still swelling significantly more than baseline.   Objective: Vitals:   01/27/20 2135 01/28/20 0500 01/28/20 0629 01/28/20 1320  BP: 127/74  135/74 116/66  Pulse: 75  72 76  Resp: 20  18 16   Temp: 99 F (37.2  C)  98 F (36.7 C) 98.6 F (37 C)  TempSrc: Oral  Oral Oral  SpO2: 98%  95% 99%  Weight:  80.9 kg    Height:        Intake/Output Summary (Last 24 hours) at 01/28/2020 1623 Last data filed at 01/28/2020 1601 Gross per 24 hour  Intake 360 ml  Output 1850 ml  Net -1490 ml   Filed Weights   01/25/20 1542 01/27/20 0500 01/28/20 0500  Weight: 85.7 kg 83.7 kg 80.9 kg    Gen: Pleasant,  elderly male in no distress Pulm: Non-labored breathing room air with mask on. Minimal crackles at bilateral bases.  CV: Regular rate and rhythm. No murmur, rub, or gallop. No JVD, 2+ pedal edema with TED hose on. GI: Abdomen soft, non-tender, non-distended, with normoactive bowel sounds. No organomegaly or masses felt. Ext: Warm, no deformities Skin: No rashes, lesions or ulcers Neuro: Alert and oriented. No focal neurological deficits. Psych: Judgement and insight appear normal. Mood & affect appropriate.   Data Reviewed: I have personally reviewed following labs and imaging studies  CBC: Recent Labs  Lab 01/25/20 1633 01/28/20 0942  WBC 5.7 5.4  HGB 9.6* 9.2*  HCT 31.3* 29.3*  MCV 106.8* 105.8*  PLT 152 734   Basic Metabolic Panel: Recent Labs  Lab 01/25/20 1633 01/26/20 0452 01/27/20 0454 01/28/20 0344  NA 141 140 138 140  K 4.3 4.0 3.9 3.9  CL 104 104 103 102  CO2 28 27 27 28   GLUCOSE 119* 99 129* 125*  BUN 31* 29* 32* 35*  CREATININE 2.00* 2.01* 2.22* 2.18*  CALCIUM 7.8* 7.9* 7.0* 6.8*  MG  --  2.4  --   --    GFR: Estimated Creatinine Clearance: 24.3 mL/min (A) (by C-G formula based on SCr of 2.18 mg/dL (H)). Liver Function Tests: Recent Labs  Lab 01/25/20 1812  AST 23  ALT 13  ALKPHOS 188*  BILITOT 0.9  PROT 6.1*  ALBUMIN 3.3*   No results for input(s): LIPASE, AMYLASE in the last 168 hours. No results for input(s): AMMONIA in the last 168 hours. Coagulation Profile: No results for input(s): INR, PROTIME in the last 168 hours. Cardiac Enzymes: No results for input(s): CKTOTAL, CKMB, CKMBINDEX, TROPONINI in the last 168 hours. BNP (last 3 results) No results for input(s): PROBNP in the last 8760 hours. HbA1C: No results for input(s): HGBA1C in the last 72 hours. CBG: No results for input(s): GLUCAP in the last 168 hours. Lipid Profile: No results for input(s): CHOL, HDL, LDLCALC, TRIG, CHOLHDL, LDLDIRECT in the last 72 hours. Thyroid Function  Tests: No results for input(s): TSH, T4TOTAL, FREET4, T3FREE, THYROIDAB in the last 72 hours. Anemia Panel: Recent Labs    01/26/20 0452 01/27/20 0454  VITAMINB12  --  1,026*  FOLATE  --  26.7  FERRITIN  --  1,208*  TIBC  --  159*  IRON  --  43*  RETICCTPCT 3.8*  --    Urine analysis:    Component Value Date/Time   COLORURINE YELLOW 01/25/2020 1619   APPEARANCEUR HAZY (A) 01/25/2020 1619   LABSPEC 1.018 01/25/2020 1619   PHURINE 5.0 01/25/2020 1619   GLUCOSEU NEGATIVE 01/25/2020 1619   HGBUR SMALL (A) 01/25/2020 1619   BILIRUBINUR NEGATIVE 01/25/2020 1619   KETONESUR NEGATIVE 01/25/2020 1619   PROTEINUR NEGATIVE 01/25/2020 1619   UROBILINOGEN 1.0 05/06/2012 2022   NITRITE NEGATIVE 01/25/2020 1619   LEUKOCYTESUR MODERATE (A) 01/25/2020 1619   Recent Results (from the  past 240 hour(s))  Urine C&S     Status: Abnormal   Collection Time: 01/25/20  4:19 PM   Specimen: Urine, Random  Result Value Ref Range Status   Specimen Description   Final    URINE, RANDOM Performed at [facility] [address] [place], [place] [address]    Special Requests   Final    NONE Performed at [facility], [place] [address], [place], [place] [address]    Culture >=100,000 COLONIES/mL ESCHERICHIA COLI (A)  Final   Report Status 01/28/2020 FINAL  Final   Organism ID, Bacteria ESCHERICHIA COLI (A)  Final      Susceptibility   Escherichia coli - MIC*    AMPICILLIN <=2 SENSITIVE Sensitive     CEFAZOLIN <=4 SENSITIVE Sensitive     CEFTRIAXONE <=0.25 SENSITIVE Sensitive     CIPROFLOXACIN <=0.25 SENSITIVE Sensitive     GENTAMICIN <=1 SENSITIVE Sensitive     IMIPENEM 0.5 SENSITIVE Sensitive     NITROFURANTOIN <=16 SENSITIVE Sensitive     TRIMETH/SULFA <=20 SENSITIVE Sensitive     AMPICILLIN/SULBACTAM <=2 SENSITIVE Sensitive     PIP/TAZO <=4 SENSITIVE Sensitive     * >=100,000 COLONIES/mL ESCHERICHIA COLI  Respiratory Panel by RT PCR (Flu A&B, Covid) -  Nasopharyngeal Swab     Status: None   Collection Time: 01/25/20  8:22 PM   Specimen: Nasopharyngeal Swab  Result Value Ref Range Status   SARS Coronavirus 2 by RT PCR NEGATIVE NEGATIVE Final    Comment: (NOTE) SARS-CoV-2 target nucleic acids are NOT DETECTED.  The SARS-CoV-2 RNA is generally detectable in upper respiratoy specimens during the acute phase of infection. The lowest concentration of SARS-CoV-2 viral copies this assay can detect is 131 copies/mL. A negative result does not preclude SARS-Cov-2 infection and should not be used as the sole basis for treatment or other patient management decisions. A negative result may occur with  improper specimen collection/handling, submission of specimen other than nasopharyngeal swab, presence of viral mutation(s) within the areas targeted by this assay, and inadequate number of viral copies (<131 copies/mL). A negative result must be combined with clinical observations, patient history, and epidemiological information. The expected result is Negative.  Fact Sheet for Patients:  PinkCheek.be  Fact Sheet for Healthcare Providers:  GravelBags.it  This test is no t yet approved or cleared by the [place] FDA and  has been authorized for detection and/or diagnosis of SARS-CoV-2 by FDA under an Emergency Use Authorization (EUA). This EUA will remain  in effect (meaning this test can be used) for the duration of the COVID-19 declaration under Section 564(b)(1) of the Act, 21 U.S.C. section 360bbb-3(b)(1), unless the authorization is terminated or revoked sooner.     Influenza A by PCR NEGATIVE NEGATIVE Final   Influenza B by PCR NEGATIVE NEGATIVE Final    Comment: (NOTE) The Xpert Xpress SARS-CoV-2/FLU/RSV assay is intended as an aid in  the diagnosis of influenza from Nasopharyngeal swab specimens and  should not be used as a sole basis for treatment. Nasal washings and    aspirates are unacceptable for Xpert Xpress SARS-CoV-2/FLU/RSV  testing.  Fact Sheet for Patients: PinkCheek.be  Fact Sheet for Healthcare Providers: GravelBags.it  This test is not yet approved or cleared by the [place] FDA and  has been authorized for detection and/or diagnosis of SARS-CoV-2 by  FDA under an Emergency Use Authorization (EUA). This EUA will remain  in effect (meaning this test can be used)  for the duration of the  Covid-19 declaration under Section 564(b)(1) of the Act, 21  U.S.C. section 360bbb-3(b)(1), unless the authorization is  terminated or revoked. Performed at [facility], [place] [address] [place], [place] [address]       Radiology Studies: No results found.  Scheduled Meds: . abiraterone acetate  1,000 mg Oral Daily  . aspirin EC  81 mg Oral Daily  . carvedilol  12.5 mg Oral BID WC  . enoxaparin (LOVENOX) injection  30 mg Subcutaneous Q24H  . fentaNYL  1 patch Transdermal Q72H  . furosemide  40 mg Intravenous BID  . isosorbide-hydrALAZINE  1 tablet Oral BID  . predniSONE  5 mg Oral BID WC  . sodium chloride flush  3 mL Intravenous Q12H  . tamsulosin  0.4 mg Oral BID   Continuous Infusions: . sodium chloride    . cefTRIAXone (ROCEPHIN)  IV 1 g (01/27/20 2005)     LOS: 2 days   Time spent: 25 minutes.  Patrecia Pour, MD [facility] [email or web address] 01/28/2020, 4:23 PM

## 2020-01-29 DIAGNOSIS — R609 Edema, unspecified: Secondary | ICD-10-CM

## 2020-01-29 DIAGNOSIS — R778 Other specified abnormalities of plasma proteins: Secondary | ICD-10-CM

## 2020-01-29 DIAGNOSIS — D638 Anemia in other chronic diseases classified elsewhere: Secondary | ICD-10-CM

## 2020-01-29 LAB — BASIC METABOLIC PANEL
Anion gap: 9 (ref 5–15)
BUN: 33 mg/dL — ABNORMAL HIGH (ref 8–23)
CO2: 29 mmol/L (ref 22–32)
Calcium: 6.4 mg/dL — CL (ref 8.9–10.3)
Chloride: 100 mmol/L (ref 98–111)
Creatinine, Ser: 2.14 mg/dL — ABNORMAL HIGH (ref 0.61–1.24)
GFR calc Af Amer: 31 mL/min — ABNORMAL LOW (ref >60)
GFR calc non Af Amer: 27 mL/min — ABNORMAL LOW (ref >60)
Glucose, Bld: 102 mg/dL — ABNORMAL HIGH (ref 70–99)
Potassium: 3.7 mmol/L (ref 3.5–5.1)
Sodium: 138 mmol/L (ref 135–145)

## 2020-01-29 LAB — LIPID PANEL
Cholesterol: 210 mg/dL — ABNORMAL HIGH (ref 0–200)
HDL: 51 mg/dL (ref >40)
LDL Cholesterol: 129 mg/dL — ABNORMAL HIGH (ref 0–99)
Total CHOL/HDL Ratio: 4.1 RATIO
Triglycerides: 149 mg/dL (ref <150)
VLDL: 30 mg/dL (ref 0–40)

## 2020-01-29 LAB — ALBUMIN: Albumin: 2.7 g/dL — ABNORMAL LOW (ref 3.5–5.0)

## 2020-01-29 MED ORDER — CALCIUM CARBONATE 1250 (500 CA) MG PO TABS
1.0000 | ORAL_TABLET | Freq: Two times a day (BID) | ORAL | Status: DC
  Administered 2020-01-29 – 2020-01-31 (×5): 500 mg via ORAL
  Filled 2020-01-29 (×5): qty 1

## 2020-01-29 MED ORDER — CALCIUM GLUCONATE-NACL 1-0.675 GM/50ML-% IV SOLN
1.0000 g | Freq: Once | INTRAVENOUS | Status: AC
  Administered 2020-01-29: 1000 mg via INTRAVENOUS
  Filled 2020-01-29 (×2): qty 50

## 2020-01-29 NOTE — Progress Notes (Signed)
PROGRESS NOTE  Tony [name] XVQ:[id number] DOB: 1930/05/20   PCP: [name], MD  Patient is from: Home  DOA: 01/25/2020 LOS: 3  Brief Narrative / Interim history: 86 year old male with history of metastatic prostate cancer to bone followed by Dr. [name], CAD s/p stent to LAD in 2012, CKD-3B, HTN, complete AV and osteoarthritis presenting with acute on chronic bilateral lower extremity edema, and admitted for new acute combined CHF.  He was started on IV Lasix.  Cardiology consulted.  Echocardiogram with LVEF of 35 to 40%, global hypokinesis, moderate concentric LVH, G1-DD and mildly elevated PASP.  Per cardiology, patient refused ischemic work-up or any invasive procedure.  Patient is diuresing well on IV Lasix.  Started on GDMT per cardiology.  Oncology consulted and discontinued his Zytiga as it contributed to his lower extremity edema.  Palliative medicine following as well.  Subjective: Seen and examined earlier this morning.  No major events overnight of this morning.  Reports improvement in edema.  He denies chest pain, dyspnea, GI or UTI symptoms.  Objective: Vitals:   01/28/20 0629 01/28/20 1320 01/28/20 2014 01/29/20 0513  BP: 135/74 116/66 118/64 129/76  Pulse: 72 76 77 73  Resp: 18 16 18 20   Temp: 98 F (36.7 C) 98.6 F (37 C) 98.5 F (36.9 C) 98.2 F (36.8 C)  TempSrc: Oral Oral Oral Oral  SpO2: 95% 99% 96% 97%  Weight:    80.2 kg  Height:        Intake/Output Summary (Last 24 hours) at 01/29/2020 1240 Last data filed at 01/29/2020 0516 Gross per 24 hour  Intake 360 ml  Output 1810 ml  Net -1450 ml   Filed Weights   01/27/20 0500 01/28/20 0500 01/29/20 0513  Weight: 83.7 kg 80.9 kg 80.2 kg    Examination:  GENERAL: No apparent distress.  Nontoxic. HEENT: MMM.  Vision and hearing grossly intact.  NECK: Supple.  No apparent JVD.  RESP: On room air.  No IWOB.  Fair aeration bilaterally. CVS:  RRR. Heart sounds normal.  ABD/GI/GU: BS+. Abd soft, NTND.    MSK/EXT:  Moves extremities. No apparent deformity.  Trace edema under TED hose. SKIN: no apparent skin lesion or wound NEURO: Awake, alert and oriented appropriately.  No apparent focal neuro deficit. PSYCH: Calm. Normal affect.  Procedures:  None  Microbiology summarized: COVID-19 PCR negative.  Assessment & Plan: New onset acute combined CHF: patient presented with acute on chronic BLE edema.  Multiple risk factors including dietary indiscretion, sedentary lifestyle, cardiac disease and medications.  Echocardiogram as above.  Responded well to IV Lasix.  He had about 2 L UOP/24 hours.  Net -6 L so far.  Weight downtrending. -Cardiology managing-continue IV Lasix 40 mg twice daily -GDMT-Bijuva and Coreg.  No Entresto/ACE inhibitor/ARB/Aldactone given CKD-3B. -Monitor fluid status, renal function and electrolytes -Counseled on sodium and fluid restrictions. -Could benefit from home health nurse for CHF management  Elevated troponin: Likely demand ischemia in the setting of CHF.  No chest pain.  Also not interested in ischemic work-up -Cardiac meds as above.  Also on aspirin.  History of CAD s/p LAD stent in 2012.  No chest pain.  Elevated troponin likely demand ischemia. -Cardiac meds and aspirin as above - Intolerant of statin in the past. Consider PCSK9.   Essential hypertension -Meds as above.  E. coli UTI: Urine culture with pansensitive E. coli.  Symptoms resolved. -Completed 3 days of ceftriaxone  CKD-3B/bone mineral disorder: Baseline creatinine 2.0-2.2.  Stable. -  Continue home Os-Cal -IV calcium gluconate 1 g x 1 -Continue monitoring while on diuretics  Metastatic prostate cancer with metastasis to bone: On Zytiga.  -Oncology consulted.  Zytiga discontinued. -I would wean his prednisone to 5 mg daily and eventually stop.  Takes for arthritic pain related to Zytiga -Continue home Flomax  Osteoarthritis -Tylenol 500 mg every 8 hours while awake -Adjust  prednisone as above  Anemia of chronic disease: Baseline Hgb 9-10> 9.6 (admit)> 9.2.  Anemia panel suggests anemia of chronic disease -Continue monitoring  Debility/physical deconditioning -PT/OT   Body mass index is 27.69 kg/m.         DVT prophylaxis:  enoxaparin (LOVENOX) injection 30 mg Start: 01/26/20 1000  Code Status: Full code Family Communication: Updated patient's daughter over the phone. Status is: Inpatient  Remains inpatient appropriate because:IV treatments appropriate due to intensity of illness or inability to take PO and Inpatient level of care appropriate due to severity of illness   Dispo: The patient is from: Home              Anticipated d/c is to: Home              Anticipated d/c date is: 2 days              Patient currently is not medically stable to d/c.       Consultants:  Cardiology Oncology   Sch Meds:  Scheduled Meds:  aspirin EC  81 mg Oral Daily   calcium carbonate  1 tablet Oral BID WC   carvedilol  12.5 mg Oral BID WC   enoxaparin (LOVENOX) injection  30 mg Subcutaneous Q24H   fentaNYL  1 patch Transdermal Q72H   furosemide  40 mg Intravenous BID   isosorbide-hydrALAZINE  1 tablet Oral BID   predniSONE  5 mg Oral BID WC   sodium chloride flush  3 mL Intravenous Q12H   tamsulosin  0.4 mg Oral BID   Continuous Infusions:  sodium chloride     PRN Meds:.sodium chloride, acetaminophen, nitroGLYCERIN, ondansetron (ZOFRAN) IV, oxyCODONE, polyethylene glycol, sodium chloride flush  Antimicrobials: Anti-infectives (From admission, onward)   Start     Dose/Rate Route Frequency Ordered Stop   01/26/20 2000  cefTRIAXone (ROCEPHIN) 1 g in sodium chloride 0.9 % 100 mL IVPB  Status:  Discontinued        1 g 200 mL/hr over 30 Minutes Intravenous Every 24 hours 01/26/20 1524 01/28/20 1629   01/25/20 1930  cefTRIAXone (ROCEPHIN) 1 g in sodium chloride 0.9 % 100 mL IVPB        1 g 200 mL/hr over 30 Minutes Intravenous  Once  01/25/20 1926 01/25/20 2239       I have personally reviewed the following labs and images: CBC: Recent Labs  Lab 01/25/20 1633 01/28/20 0942  WBC 5.7 5.4  HGB 9.6* 9.2*  HCT 31.3* 29.3*  MCV 106.8* 105.8*  PLT 152 154   BMP &GFR Recent Labs  Lab 01/25/20 1633 01/26/20 0452 01/27/20 0454 01/28/20 0344 01/29/20 0545  NA 141 140 138 140 138  K 4.3 4.0 3.9 3.9 3.7  CL 104 104 103 102 100  CO2 28 27 27 28 29   GLUCOSE 119* 99 129* 125* 102*  BUN 31* 29* 32* 35* 33*  CREATININE 2.00* 2.01* 2.22* 2.18* 2.14*  CALCIUM 7.8* 7.9* 7.0* 6.8* 6.4*  MG  --  2.4  --   --   --    Estimated Creatinine Clearance: 24.7  mL/min (A) (by C-G formula based on SCr of 2.14 mg/dL (H)). Liver & Pancreas: Recent Labs  Lab 01/25/20 1812 01/29/20 0545  AST 23  --   ALT 13  --   ALKPHOS 188*  --   BILITOT 0.9  --   PROT 6.1*  --   ALBUMIN 3.3* 2.7*   No results for input(s): LIPASE, AMYLASE in the last 168 hours. No results for input(s): AMMONIA in the last 168 hours. Diabetic: No results for input(s): HGBA1C in the last 72 hours. No results for input(s): GLUCAP in the last 168 hours. Cardiac Enzymes: No results for input(s): CKTOTAL, CKMB, CKMBINDEX, TROPONINI in the last 168 hours. No results for input(s): PROBNP in the last 8760 hours. Coagulation Profile: No results for input(s): INR, PROTIME in the last 168 hours. Thyroid Function Tests: No results for input(s): TSH, T4TOTAL, FREET4, T3FREE, THYROIDAB in the last 72 hours. Lipid Profile: Recent Labs    01/29/20 0545  CHOL 210*  HDL 51  LDLCALC 129*  TRIG 149  CHOLHDL 4.1   Anemia Panel: Recent Labs    01/27/20 0454  VITAMINB12 1,026*  FOLATE 26.7  FERRITIN 1,208*  TIBC 159*  IRON 43*   Urine analysis:    Component Value Date/Time   COLORURINE YELLOW 01/25/2020 1619   APPEARANCEUR HAZY (A) 01/25/2020 1619   LABSPEC 1.018 01/25/2020 1619   PHURINE 5.0 01/25/2020 1619   GLUCOSEU NEGATIVE 01/25/2020 1619    HGBUR SMALL (A) 01/25/2020 1619   BILIRUBINUR NEGATIVE 01/25/2020 1619   KETONESUR NEGATIVE 01/25/2020 1619   PROTEINUR NEGATIVE 01/25/2020 1619   UROBILINOGEN 1.0 05/06/2012 2022   NITRITE NEGATIVE 01/25/2020 1619   LEUKOCYTESUR MODERATE (A) 01/25/2020 1619   Sepsis Labs: Invalid input(s): PROCALCITONIN, [place]  Microbiology: Recent Results (from the past 240 hour(s))  Urine C&S     Status: Abnormal   Collection Time: 01/25/20  4:19 PM   Specimen: Urine, Random  Result Value Ref Range Status   Specimen Description   Final    URINE, RANDOM Performed at [facility] [address] [place], [place] [address]    Special Requests   Final    NONE Performed at [facility], [place] [address], [place], [place] [address]    Culture >=100,000 COLONIES/mL ESCHERICHIA COLI (A)  Final   Report Status 01/28/2020 FINAL  Final   Organism ID, Bacteria ESCHERICHIA COLI (A)  Final      Susceptibility   Escherichia coli - MIC*    AMPICILLIN <=2 SENSITIVE Sensitive     CEFAZOLIN <=4 SENSITIVE Sensitive     CEFTRIAXONE <=0.25 SENSITIVE Sensitive     CIPROFLOXACIN <=0.25 SENSITIVE Sensitive     GENTAMICIN <=1 SENSITIVE Sensitive     IMIPENEM 0.5 SENSITIVE Sensitive     NITROFURANTOIN <=16 SENSITIVE Sensitive     TRIMETH/SULFA <=20 SENSITIVE Sensitive     AMPICILLIN/SULBACTAM <=2 SENSITIVE Sensitive     PIP/TAZO <=4 SENSITIVE Sensitive     * >=100,000 COLONIES/mL ESCHERICHIA COLI  Respiratory Panel by RT PCR (Flu A&B, Covid) - Nasopharyngeal Swab     Status: None   Collection Time: 01/25/20  8:22 PM   Specimen: Nasopharyngeal Swab  Result Value Ref Range Status   SARS Coronavirus 2 by RT PCR NEGATIVE NEGATIVE Final    Comment: (NOTE) SARS-CoV-2 target nucleic acids are NOT DETECTED.  The SARS-CoV-2 RNA is generally detectable in upper respiratoy specimens during the acute phase of infection. The lowest concentration of SARS-CoV-2 viral copies  this  assay can detect is 131 copies/mL. A negative result does not preclude SARS-Cov-2 infection and should not be used as the sole basis for treatment or other patient management decisions. A negative result may occur with  improper specimen collection/handling, submission of specimen other than nasopharyngeal swab, presence of viral mutation(s) within the areas targeted by this assay, and inadequate number of viral copies (<131 copies/mL). A negative result must be combined with clinical observations, patient history, and epidemiological information. The expected result is Negative.  Fact Sheet for Patients:  PinkCheek.be  Fact Sheet for Healthcare Providers:  GravelBags.it  This test is no t yet approved or cleared by the [place] FDA and  has been authorized for detection and/or diagnosis of SARS-CoV-2 by FDA under an Emergency Use Authorization (EUA). This EUA will remain  in effect (meaning this test can be used) for the duration of the COVID-19 declaration under Section 564(b)(1) of the Act, 21 U.S.C. section 360bbb-3(b)(1), unless the authorization is terminated or revoked sooner.     Influenza A by PCR NEGATIVE NEGATIVE Final   Influenza B by PCR NEGATIVE NEGATIVE Final    Comment: (NOTE) The Xpert Xpress SARS-CoV-2/FLU/RSV assay is intended as an aid in  the diagnosis of influenza from Nasopharyngeal swab specimens and  should not be used as a sole basis for treatment. Nasal washings and  aspirates are unacceptable for Xpert Xpress SARS-CoV-2/FLU/RSV  testing.  Fact Sheet for Patients: PinkCheek.be  Fact Sheet for Healthcare Providers: GravelBags.it  This test is not yet approved or cleared by the [place] FDA and  has been authorized for detection and/or diagnosis of SARS-CoV-2 by  FDA under an Emergency Use Authorization (EUA). This EUA will  remain  in effect (meaning this test can be used) for the duration of the  Covid-19 declaration under Section 564(b)(1) of the Act, 21  U.S.C. section 360bbb-3(b)(1), unless the authorization is  terminated or revoked. Performed at [facility], [place] [address], [place],  [address]     Radiology Studies: No results found.    Maddie Brazier T. Adak  If 7PM-7AM, please contact night-coverage [email or web address] 01/29/2020, 12:40 PM

## 2020-01-29 NOTE — Progress Notes (Addendum)
Daily Progress Note   Patient Name: Tony [name]       Date: 01/29/2020 DOB: [birth year]  Age: 86 y.o. MRN#: [id number] Attending Physician: Mercy Riding, MD Primary Care Physician: [name], MD Admit Date: 01/25/2020  Reason for Consultation/Follow-up: Establishing goals of care  Subjective/GOC:  Patient awake, alert, and in good spirits this morning. Reports he is feeling better and appreciative of the care he is receiving. He remains willing to stay hospitalized as long as necessary for improvement in his condition. He is hopeful to hear from oncology while he is hospitalized (This NP spoke with Dr. Bonner Puna about oncology consult yesterday).   No family at bedside. MOST and AD packet at bedside and have been reviewed with patient and daughter via telephone. Again encouraged patient to have daughter call PMT provider if they are ready to complete documentation prior to discharge. PMT contact information left at bedside. Answered questions.   Length of Stay: 3  Current Medications: Scheduled Meds:  . abiraterone acetate  1,000 mg Oral Daily  . aspirin EC  81 mg Oral Daily  . carvedilol  12.5 mg Oral BID WC  . enoxaparin (LOVENOX) injection  30 mg Subcutaneous Q24H  . fentaNYL  1 patch Transdermal Q72H  . furosemide  40 mg Intravenous BID  . isosorbide-hydrALAZINE  1 tablet Oral BID  . predniSONE  5 mg Oral BID WC  . sodium chloride flush  3 mL Intravenous Q12H  . tamsulosin  0.4 mg Oral BID    Continuous Infusions: . sodium chloride    . calcium gluconate      PRN Meds: sodium chloride, acetaminophen, nitroGLYCERIN, ondansetron (ZOFRAN) IV, oxyCODONE, polyethylene glycol, sodium chloride flush  Physical Exam Vitals and nursing note reviewed.  Constitutional:       General: He is awake.  HENT:     Head: Normocephalic and atraumatic.  Pulmonary:     Effort: No tachypnea, accessory muscle usage or respiratory distress.  Skin:    General: Skin is warm and dry.  Neurological:     Mental Status: He is alert and oriented to person, place, and time.            Vital Signs: BP 129/76 (BP Location: Right Arm)   Pulse 73   Temp 98.2 F (36.8 C) (Oral)  Resp 20   Ht 5\' 7"  (1.702 m)   Wt 80.2 kg   SpO2 97%   BMI 27.69 kg/m  SpO2: SpO2: 97 % O2 Device: O2 Device: Room Air O2 Flow Rate:    Intake/output summary:   Intake/Output Summary (Last 24 hours) at 01/29/2020 2035 Last data filed at 01/29/2020 0516 Gross per 24 hour  Intake 360 ml  Output 2110 ml  Net -1750 ml   LBM: Last BM Date: 01/26/20 Baseline Weight: Weight: 85.7 kg Most recent weight: Weight: 80.2 kg       Palliative Assessment/Data: PPS 60%    Flowsheet Rows     Most Recent Value  Intake Tab  Referral Department Critical care  Unit at Time of Referral Med/Surg Unit  Palliative Care Primary Diagnosis Cancer  Date Notified 01/26/20  Palliative Care Type New Palliative care  Reason for referral Clarify Goals of Care  Date of Admission 01/25/20  Date first seen by Palliative Care 01/27/20  # of days Palliative referral response time 1 Day(s)  # of days IP prior to Palliative referral 1  Clinical Assessment  Psychosocial & Spiritual Assessment  Palliative Care Outcomes      Patient Active Problem List   Diagnosis Date Noted  . Peripheral edema   . Palliative care by specialist   . Goals of care, counseling/discussion   . Acute on chronic congestive heart failure ([place]) 01/26/2020  . Bilateral leg edema 01/26/2020  . Osteoarthritis of knee 07/16/2019  . Prostate cancer metastatic to bone ([place]) 08/07/2017  . Macrocytic anemia 07/20/2017  . AKI (acute kidney injury) ([place]) 07/20/2017  . Thrombocytopenia ([place]) 07/20/2017  . Claudication of both lower extremities  ([place]) 07/20/2017  . Anemia   . History of complete AV block 10/30/2016  . Venous insufficiency 10/30/2016  . Chronic kidney disease, stage 3b 10/30/2016  . Essential hypertension   . Hyperlipidemia   . BPH associated with nocturia   . Coronary artery disease involving native coronary artery of native heart without angina pectoris   . History of stroke   . Gout     Palliative Care Assessment & Plan   Patient Profile: 87 y.o. male  with past medical history of metastatic prostate cancer to bone, CAD s/p MI with stent placement 2012, CKD stage IIIB, HTN, anemia admitted on 01/25/2020 with progressive bilateral lower extremity edema for several months. Followed by Dr. [name] for metastatic prostate cancer on Zytiga. Hospital admission for acute on chronic CHF found to have EF 35-40% receiving lasix. Palliative medicine consultation for goals of care.   Assessment: Metastatic prostate cancer to bone New onset acute systolic and diastolic heart failure Hx of CAD s/p PCI E. Coli UTI CKD stage IIIb Anemia of chronic disease  Recommendations/Plan:  Ongoing palliative discussions pending clinical course.   Daughter hopeful for oncology consultation inpatient. Discussed with Dr. Bonner Puna.  Continue current plan of care and medical management.   AD packet and MOST form reviewed with patient and daughter. Encouraged ongoing discussions and consideration of limitations to care with underlying metastatic cancer/heart failure. Daughter has PMT contact information and understands she can call PMT provider if her and her father are ready to complete MOST form prior to discharge.   May benefit from outpatient palliative f/u.   Outpatient cardiology follow-up.   Home health services. Daughter wondering if he is eligible for home health RN to assist with heart failure management. TOC team notified.  Code Status: FULL   Code Status Orders  (  From admission, onward)         Start     Ordered    01/26/20 0139  Full code  Continuous        01/26/20 0142        Code Status History    Date Active Date Inactive Code Status Order ID Comments User Context   07/20/2017 2021 07/22/2017 2144 Full Code [id number]  Wouk, Ailene Rud, MD Inpatient   Advance Care Planning Activity       Prognosis:   Unable to determine  Discharge Planning:  To Be Determined  Care plan was discussed with RN, patient  Thank you for allowing the Palliative Medicine Team to assist in the care of this patient.   Total Time 15 Prolonged Time Billed no    Greater than 50% of this time was spent counseling and coordinating care related to the above assessment and plan.   Ihor Dow, DNP, FNP-C Palliative Medicine Team  Phone: [phone] Fax: [phone]  Please contact Palliative Medicine Team phone at [phone] for questions and concerns.

## 2020-01-29 NOTE — Progress Notes (Signed)
Progress Note  Patient Name: Tony [name] Date of Encounter: 01/29/2020  [facility] Cardiologist: Larae Grooms, MD   Subjective   Patient put out -2.1L overnight. Volume status improving. Patient had a good night last night. No chest pain.   Inpatient Medications    Scheduled Meds: . abiraterone acetate  1,000 mg Oral Daily  . aspirin EC  81 mg Oral Daily  . carvedilol  12.5 mg Oral BID WC  . enoxaparin (LOVENOX) injection  30 mg Subcutaneous Q24H  . fentaNYL  1 patch Transdermal Q72H  . furosemide  40 mg Intravenous BID  . isosorbide-hydrALAZINE  1 tablet Oral BID  . predniSONE  5 mg Oral BID WC  . sodium chloride flush  3 mL Intravenous Q12H  . tamsulosin  0.4 mg Oral BID   Continuous Infusions: . sodium chloride    . calcium gluconate     PRN Meds: sodium chloride, acetaminophen, nitroGLYCERIN, ondansetron (ZOFRAN) IV, oxyCODONE, polyethylene glycol, sodium chloride flush   Vital Signs    Vitals:   01/28/20 0629 01/28/20 1320 01/28/20 2014 01/29/20 0513  BP: 135/74 116/66 118/64 129/76  Pulse: 72 76 77 73  Resp: $Remo'18 16 18 20  'xpFfW$ Temp: 98 F (36.7 C) 98.6 F (37 C) 98.5 F (36.9 C) 98.2 F (36.8 C)  TempSrc: Oral Oral Oral Oral  SpO2: 95% 99% 96% 97%  Weight:    80.2 kg  Height:        Intake/Output Summary (Last 24 hours) at 01/29/2020 0801 Last data filed at 01/29/2020 0516 Gross per 24 hour  Intake 360 ml  Output 2110 ml  Net -1750 ml   Last 3 Weights 01/29/2020 01/28/2020 01/27/2020  Weight (lbs) 176 lb 12.9 oz 178 lb 5.6 oz 184 lb 8.4 oz  Weight (kg) 80.2 kg 80.9 kg 83.7 kg      Telemetry    NSR, HR 70s, PVCs, 3 beats NSVT - Personally Reviewed  ECG    No new - Personally Reviewed  Physical Exam   GEN: No acute distress.   Neck: No JVD Cardiac: RRR, no murmurs, rubs, or gallops.  Respiratory: Clear to auscultation bilaterally. GI: Soft, nontender, non-distended  MS: mild edema, R>L; No deformity. Neuro:  Nonfocal  Psych:  Normal affect   Labs    High Sensitivity Troponin:   Recent Labs  Lab 01/25/20 1817 01/25/20 2014  TROPONINIHS 22* 25*      Chemistry Recent Labs  Lab 01/25/20 1812 01/26/20 0452 01/27/20 0454 01/28/20 0344 01/29/20 0545  NA  --    < > 138 140 138  K  --    < > 3.9 3.9 3.7  CL  --    < > 103 102 100  CO2  --    < > $R'27 28 29  'Gb$ GLUCOSE  --    < > 129* 125* 102*  BUN  --    < > 32* 35* 33*  CREATININE  --    < > 2.22* 2.18* 2.14*  CALCIUM  --    < > 7.0* 6.8* 6.4*  PROT 6.1*  --   --   --   --   ALBUMIN 3.3*  --   --   --   --   AST 23  --   --   --   --   ALT 13  --   --   --   --   ALKPHOS 188*  --   --   --   --  BILITOT 0.9  --   --   --   --   GFRNONAA  --    < > 26* 26* 27*  GFRAA  --    < > 30* 30* 31*  ANIONGAP  --    < > $R'8 10 9   'fz$ < > = values in this interval not displayed.     Hematology Recent Labs  Lab 01/25/20 1633 01/26/20 0452 01/28/20 0942  WBC 5.7  --  5.4  RBC 2.93* 2.88* 2.77*  HGB 9.6*  --  9.2*  HCT 31.3*  --  29.3*  MCV 106.8*  --  105.8*  MCH 32.8  --  33.2  MCHC 30.7  --  31.4  RDW 15.6*  --  15.2  PLT 152  --  154    BNP Recent Labs  Lab 01/25/20 1633  BNP 662.0*     DDimer No results for input(s): DDIMER in the last 168 hours.   Radiology    No results found.  Cardiac Studies    Echo 01/26/20 1. Left ventricular ejection fraction, by estimation, is 35 to 40%. The  left ventricle has moderately decreased function. The left ventricle  demonstrates global hypokinesis. There is moderate concentric left  ventricular hypertrophy. Left ventricular  diastolic parameters are consistent with Grade I diastolic dysfunction  (impaired relaxation).  2. Right ventricular systolic function is mildly reduced. The right  ventricular size is mildly enlarged. There is mildly elevated pulmonary  artery systolic pressure. The estimated right ventricular systolic  pressure is 51.7 mmHg.  3. The mitral valve is normal in structure.  Mild mitral valve  regurgitation. No evidence of mitral stenosis.  4. Tricuspid valve regurgitation is moderate.  5. The aortic valve is normal in structure. Aortic valve regurgitation is  mild. No aortic stenosis is present.  6. The inferior vena cava is normal in size with greater than 50%  respiratory variability, suggesting right atrial pressure of 3 mmHg.    Patient Profile     86 y.o. male with a historyof CAD s/p prior stenting to LAD in 2012 with occluded RCA with collaterals also noted at that time, prior stroke in 2012, hypertension, hyperlipidemia with intolerance to statins, CKD stage IV, BPH, gout, chronic anemia, and stage IV prostate cancer with metastasis to bonewho is being seen today for the evaluation ofCHF.  Assessment & Plan    New onset systolic CHF - BNP elevated in the 600. CXR showed cardiomegaly but no edema - Echo showed LVEF 35-40% (down from 50% at time of MI) with moderate LVH and global hypokinesis. RV also mildly enlarged with mildly reduced systolic function and mildly elevated PASP 61mmHg - On lasix $Remove'20mg'MWnBpiR$  >>increased to $RemoveBefore'40mg'rwkKmgGoAHIvG$  BID - continue Bidil. Toprol was changed to coreg - No ACE/ARB/Entresto with CKD - Unsure etiology of CM, possible that prednisone and zytiga are contributing to volume retention. Possible also tx for prostate cancer can lead to cardiotoxicity - encouraged low salt diet and fluid restriction - Patient does not want cath or invasive procedures so no plan for Myoview. He is not having chest pain so plan for conservative management - Overnight he put out 2.1L. net -5.8L. Weight is down 2 lbs since yesterday - creatinine improving with diuresis, 2.22>2.18>2.14. continue diuresis. Suspect he will not need much more IV lasix. Can likely switch to PO tomorrow.  - dietician consulted for CHF diet  CAD s/p stenting to LAD in 2012/Suspected demand ischemia - NSTEMI in 2012 with stenting  to LAD also noted to have RCA with collaterals -  HS troponin minimally elevated and flat, suspect demand ischemia - EKG with no ischemic changes - continue aspirin, BB. Intolerant to statin - Plan for medical management as above  HTN - Continue BB and Bidil - BP today 135/74  HLD - No statin due to intolerance due to myalgias - LDL 129  CKD stage IV - creatinine 2.0. Baseline 1.8-2.1 - continue to monitor with diuresis  Elevated alk phos - chronic from bone metastases  Chronic anemia - Hgb 9.6 on admission - Hgb so far stable  Goals of care - palliative consulted  For questions or updates, please contact [place] Please consult [email or web address] for contact info under        Signed, Marcayla Budge Ninfa Meeker, PA-C  01/29/2020, 8:01 AM

## 2020-01-29 NOTE — Progress Notes (Signed)
HEMATOLOGY-ONCOLOGY PROGRESS NOTE  SUBJECTIVE: The patient was admitted with lower extremity edema due to CHF.  He has been receiving IV Lasix.  Echocardiogram this admission showed ejection fraction of 35 to 40% which was decreased compared to prior.  He reports that his lower extremity edema has improved significantly.  He is not complaining of any chest pain or shortness of breath today.  No bony pain reported.  REVIEW OF SYSTEMS:   Constitutional: Denies fevers and chills.  13 pound weight loss since admission secondary to diuresis Eyes: Denies blurriness of vision Ears, nose, mouth, throat, and face: Denies mucositis or sore throat Respiratory: Denies cough, dyspnea or wheezes Cardiovascular: Denies palpitation, chest discomfort Gastrointestinal:  Denies nausea, heartburn or change in bowel habits Skin: Denies abnormal skin rashes Lymphatics: Denies new lymphadenopathy or easy bruising Neurological:Denies numbness, tingling or new weaknesses Behavioral/Psych: Mood is stable, no new changes  Extremities: Lower extremity edema improved with diuresis All other systems were reviewed with the patient and are negative.  I have reviewed the past medical history, past surgical history, social history and family history with the patient and they are unchanged from previous note.   PHYSICAL EXAMINATION: ECOG PERFORMANCE STATUS: 2 - Symptomatic, <50% confined to bed  Vitals:   01/29/20 0513 01/29/20 1407  BP: 129/76 124/63  Pulse: 73 85  Resp: 20 14  Temp: 98.2 F (36.8 C) 98.1 F (36.7 C)  SpO2: 97% 99%   Filed Weights   01/27/20 0500 01/28/20 0500 01/29/20 0513  Weight: 83.7 kg 80.9 kg 80.2 kg    Intake/Output from previous day: 09/29 0701 - 09/30 0700 In: 360 [P.O.:360] Out: 2110 [Urine:2110]  GENERAL:alert, no distress and comfortable SKIN: skin color, texture, turgor are normal, no rashes or significant lesions LUNGS: clear to auscultation and percussion with normal  breathing effort HEART: regular rate & rhythm and no murmurs, trace bilateral lower extremity edema ABDOMEN:abdomen soft, non-tender and normal bowel sounds NEURO: alert & oriented x 3 with fluent speech, no focal motor/sensory deficits  LABORATORY DATA:  I have reviewed the data as listed CMP Latest Ref Rng & Units 01/29/2020 01/28/2020 01/27/2020  Glucose 70 - 99 mg/dL 102(H) 125(H) 129(H)  BUN 8 - 23 mg/dL 33(H) 35(H) 32(H)  Creatinine 0.61 - 1.24 mg/dL 2.14(H) 2.18(H) 2.22(H)  Sodium 135 - 145 mmol/L 138 140 138  Potassium 3.5 - 5.1 mmol/L 3.7 3.9 3.9  Chloride 98 - 111 mmol/L 100 102 103  CO2 22 - 32 mmol/L 29 28 27   Calcium 8.9 - 10.3 mg/dL 6.4(LL) 6.8(L) 7.0(L)  Total Protein 6.5 - 8.1 g/dL - - -  Total Bilirubin 0.3 - 1.2 mg/dL - - -  Alkaline Phos 38 - 126 U/L - - -  AST 15 - 41 U/L - - -  ALT 0 - 44 U/L - - -    Lab Results  Component Value Date   WBC 5.4 01/28/2020   HGB 9.2 (L) 01/28/2020   HCT 29.3 (L) 01/28/2020   MCV 105.8 (H) 01/28/2020   PLT 154 01/28/2020   NEUTROABS 4.1 01/13/2020    DG Chest 2 View  Result Date: 01/25/2020 CLINICAL DATA:  Patient with bilateral feet and leg swelling. EXAM: CHEST - 2 VIEW COMPARISON:  Chest radiograph January 25, 2020 FINDINGS: Monitoring leads overlie the patient. Stable cardiomegaly. No large area pulmonary consolidation. No pleural effusion or pneumothorax. Patchy sclerotic osseous changes throughout the axial and appendicular skeleton compatible with history of metastatic prostate cancer. IMPRESSION: Cardiomegaly. No acute  cardiopulmonary process. Electronically Signed   By: Lovey Newcomer M.D.   On: 01/25/2020 18:49   ECHOCARDIOGRAM COMPLETE  Result Date: 01/26/2020    ECHOCARDIOGRAM REPORT   Patient Name:   [name] Date of Exam: 01/26/2020 Medical Rec #:  [id number]      Height:       67.0 in Accession #:    [id number]     Weight:       189.0 lb Date of Birth:  [birth year]      BSA:          1.974 m Patient Age:    86  years       BP:           147/81 mmHg Patient Gender: M              HR:           72 bpm. Exam Location:  Inpatient Procedure: 2D Echo and Intracardiac Opacification Agent Indications:    CHF- Acute Systolic J88.41  History:        Patient has prior history of Echocardiogram examinations, most                 recent 03/02/2011. CAD; Risk Factors:Hypertension and                 Dyslipidemia.  Sonographer:    Mikki Santee RDCS (AE) Referring Phys: [id number] [place]  1. Left ventricular ejection fraction, by estimation, is 35 to 40%. The left ventricle has moderately decreased function. The left ventricle demonstrates global hypokinesis. There is moderate concentric left ventricular hypertrophy. Left ventricular diastolic parameters are consistent with Grade I diastolic dysfunction (impaired relaxation).  2. Right ventricular systolic function is mildly reduced. The right ventricular size is mildly enlarged. There is mildly elevated pulmonary artery systolic pressure. The estimated right ventricular systolic pressure is 60.1 mmHg.  3. The mitral valve is normal in structure. Mild mitral valve regurgitation. No evidence of mitral stenosis.  4. Tricuspid valve regurgitation is moderate.  5. The aortic valve is normal in structure. Aortic valve regurgitation is mild. No aortic stenosis is present.  6. The inferior vena cava is normal in size with greater than 50% respiratory variability, suggesting right atrial pressure of 3 mmHg. FINDINGS  Left Ventricle: Left ventricular ejection fraction, by estimation, is 35 to 40%. The left ventricle has moderately decreased function. The left ventricle demonstrates global hypokinesis. Definity contrast agent was given IV to delineate the left ventricular endocardial borders. The left ventricular internal cavity size was normal in size. There is moderate concentric left ventricular hypertrophy. Abnormal (paradoxical) septal motion, consistent with left  bundle branch block. Left ventricular diastolic parameters are consistent with Grade I diastolic dysfunction (impaired relaxation). Normal left ventricular filling pressure. Right Ventricle: The right ventricular size is mildly enlarged. No increase in right ventricular wall thickness. Right ventricular systolic function is mildly reduced. There is mildly elevated pulmonary artery systolic pressure. The tricuspid regurgitant  velocity is 3.14 m/s, and with an assumed right atrial pressure of 3 mmHg, the estimated right ventricular systolic pressure is 09.3 mmHg. Left Atrium: Left atrial size was normal in size. Right Atrium: Right atrial size was normal in size. Pericardium: There is no evidence of pericardial effusion. Mitral Valve: The mitral valve is normal in structure. There is mild thickening of the mitral valve leaflet(s). Mild mitral annular calcification. Mild mitral valve regurgitation. No evidence of mitral valve stenosis. Tricuspid Valve: The tricuspid  valve is normal in structure. Tricuspid valve regurgitation is moderate . No evidence of tricuspid stenosis. Aortic Valve: The aortic valve is normal in structure. Aortic valve regurgitation is mild. No aortic stenosis is present. Pulmonic Valve: The pulmonic valve was normal in structure. Pulmonic valve regurgitation is not visualized. No evidence of pulmonic stenosis. Aorta: The aortic root is normal in size and structure. Venous: The inferior vena cava is normal in size with greater than 50% respiratory variability, suggesting right atrial pressure of 3 mmHg. IAS/Shunts: No atrial level shunt detected by color flow Doppler.  LEFT VENTRICLE PLAX 2D LVIDd:         5.20 cm  Diastology LVIDs:         4.40 cm  LV e' medial:    4.64 cm/s LV PW:         1.10 cm  LV E/e' medial:  16.5 LV IVS:        1.30 cm  LV e' lateral:   5.34 cm/s LVOT diam:     2.10 cm  LV E/e' lateral: 14.3 LV SV:         58 LV SV Index:   29 LVOT Area:     3.46 cm  RIGHT VENTRICLE RV S  prime:     18.30 cm/s TAPSE (M-mode): 1.9 cm LEFT ATRIUM           Index       RIGHT ATRIUM           Index LA diam:      3.20 cm 1.62 cm/m  RA Area:     17.90 cm LA Vol (A4C): 78.9 ml 39.97 ml/m RA Volume:   47.10 ml  23.86 ml/m  AORTIC VALVE LVOT Vmax:   101.00 cm/s LVOT Vmean:  59.100 cm/s LVOT VTI:    0.168 m  AORTA Ao Root diam: 3.10 cm MITRAL VALVE                TRICUSPID VALVE MV Area (PHT): 3.08 cm     TR Peak grad:   39.4 mmHg MV Decel Time: 246 msec     TR Vmax:        314.00 cm/s MV E velocity: 76.40 cm/s MV A velocity: 135.00 cm/s  SHUNTS MV E/A ratio:  0.57         Systemic VTI:  0.17 m                             Systemic Diam: 2.10 cm Ena Dawley MD Electronically signed by Ena Dawley MD Signature Date/Time: 01/26/2020/12:57:47 PM    Final     ASSESSMENT AND PLAN: 87 y.o. male with  1. Normocytic Anemia - due to primarily metastatic prostatic cancer (ACD + BM involvement with prostate cancer causing myelopthisic picture)  -Pt presented with a Hgb at 8.7 on 07/22/17, improved to 10.3 after receiving PRBC transfusion. -In review of the patient's previous CBC records, his anemia developed 6-7 months before presenting to care with me, unaccompanied by a drop in his EPO (07/20/17 elevated at 249.8);  LDH elevated  but haptoglobin at 357 on 07/20/17. -suggested against active hemolysis. -Hemoglobin remained stable overall this admission. -He has no evidence of vitamin B12, iron, or folate deficiency.   -Recommend continued monitoring and transfuse only if hemoglobin is less than 7.  2. Metastatic prostate cancer with extensive bone metastases. With bone mets/BM Mets and LNadenopathy. Exam positive for left  retroperitoneal, bilateral pelvis and right inguinal adenopathy. Exam positive for left retroperitoneal, bilateral pelvis and right inguinal adenopathy. Diffuse sclerotic bone metastasis. -He is currently on Zytiga 1000 mg daily and prednisone 5 mg twice daily. -Zytiga can  cause edema is likely contributing to his symptoms, but it is not known to cause cardiomyopathy -PSA noted to be rising with last check on 01/13/2020 (up to 371 from 181 on 07/29/2019) -We will discontinue Zytiga for now given rising PSA and that is likely contributing to edema, but will continue prednisone 5 mg twice daily as prednisone combined with Zytiga minimizes edema.  We do not want to stop this abruptly. -The patient has known bone lesions and is receiving Xgeva.  He has hypocalcemia noted on his lab with a corrected calcium of 7.  We will start him on calcium carbonate twice a day.  The patient is currently scheduled for outpatient follow-up on 02/10/2020.  He was advised to keep this appointment.  We will determine at that visit treatment options for his metastatic prostate cancer.   LOS: 3 days   Mikey Bussing, DNP, AGPCNP-BC, AOCNP 01/29/20

## 2020-01-29 NOTE — Progress Notes (Signed)
CRITICAL VALUE ALERT  Critical Value:  Ca 6.4  Date & Time Notied:  9/30 0755  Provider Notified: Dr Cyndia Skeeters  Orders Received/Actions taken: MD called back, no orders received at this time. Will continue to monitor.

## 2020-01-30 ENCOUNTER — Other Ambulatory Visit: Payer: Self-pay | Admitting: Medical

## 2020-01-30 DIAGNOSIS — R5381 Other malaise: Secondary | ICD-10-CM

## 2020-01-30 DIAGNOSIS — I5031 Acute diastolic (congestive) heart failure: Secondary | ICD-10-CM

## 2020-01-30 DIAGNOSIS — Z66 Do not resuscitate: Secondary | ICD-10-CM

## 2020-01-30 DIAGNOSIS — M1990 Unspecified osteoarthritis, unspecified site: Secondary | ICD-10-CM

## 2020-01-30 LAB — RENAL FUNCTION PANEL
Albumin: 2.8 g/dL — ABNORMAL LOW (ref 3.5–5.0)
Anion gap: 11 (ref 5–15)
BUN: 39 mg/dL — ABNORMAL HIGH (ref 8–23)
CO2: 29 mmol/L (ref 22–32)
Calcium: 6.7 mg/dL — ABNORMAL LOW (ref 8.9–10.3)
Chloride: 99 mmol/L (ref 98–111)
Creatinine, Ser: 2.05 mg/dL — ABNORMAL HIGH (ref 0.61–1.24)
GFR calc Af Amer: 33 mL/min — ABNORMAL LOW (ref >60)
GFR calc non Af Amer: 28 mL/min — ABNORMAL LOW (ref >60)
Glucose, Bld: 123 mg/dL — ABNORMAL HIGH (ref 70–99)
Phosphorus: 1.6 mg/dL — ABNORMAL LOW (ref 2.5–4.6)
Potassium: 4 mmol/L (ref 3.5–5.1)
Sodium: 139 mmol/L (ref 135–145)

## 2020-01-30 LAB — MAGNESIUM: Magnesium: 1.9 mg/dL (ref 1.7–2.4)

## 2020-01-30 LAB — HEMOGLOBIN AND HEMATOCRIT, BLOOD
HCT: 28.8 % — ABNORMAL LOW (ref 39.0–52.0)
Hemoglobin: 9 g/dL — ABNORMAL LOW (ref 13.0–17.0)

## 2020-01-30 MED ORDER — CALCIUM GLUCONATE-NACL 2-0.675 GM/100ML-% IV SOLN
2.0000 g | Freq: Once | INTRAVENOUS | Status: AC
  Administered 2020-01-30: 2000 mg via INTRAVENOUS
  Filled 2020-01-30: qty 100

## 2020-01-30 MED ORDER — SODIUM PHOSPHATES 45 MMOLE/15ML IV SOLN
20.0000 mmol | Freq: Once | INTRAVENOUS | Status: AC
  Administered 2020-01-30: 20 mmol via INTRAVENOUS
  Filled 2020-01-30: qty 6.67

## 2020-01-30 MED ORDER — PREDNISONE 5 MG PO TABS
5.0000 mg | ORAL_TABLET | Freq: Every day | ORAL | Status: DC
  Administered 2020-01-31: 5 mg via ORAL
  Filled 2020-01-30: qty 1

## 2020-01-30 MED ORDER — POLYETHYLENE GLYCOL 3350 17 G PO PACK
17.0000 g | PACK | Freq: Every day | ORAL | Status: DC
  Administered 2020-01-31: 17 g via ORAL
  Filled 2020-01-30: qty 1

## 2020-01-30 MED ORDER — FUROSEMIDE 40 MG PO TABS
40.0000 mg | ORAL_TABLET | Freq: Every day | ORAL | Status: DC
  Administered 2020-01-31: 40 mg via ORAL
  Filled 2020-01-30: qty 1

## 2020-01-30 MED ORDER — SENNOSIDES-DOCUSATE SODIUM 8.6-50 MG PO TABS
2.0000 | ORAL_TABLET | Freq: Two times a day (BID) | ORAL | Status: DC
  Administered 2020-01-30 – 2020-01-31 (×2): 2 via ORAL
  Filled 2020-01-30 (×2): qty 2

## 2020-01-30 NOTE — Progress Notes (Signed)
PROGRESS NOTE  Tony [name] CWC:[id number] DOB: Sep 17, 1930   PCP: [name], MD  Patient is from: Home  DOA: 01/25/2020 LOS: 4  Brief Narrative / Interim history: 86 year old male with history of metastatic prostate cancer to bone followed by Dr. [name], CAD s/p stent to LAD in 2012, CKD-3B, HTN, complete AV and osteoarthritis presenting with acute on chronic bilateral lower extremity edema, and admitted for new acute combined CHF.  He was started on IV Lasix.  Cardiology consulted.  Echocardiogram with LVEF of 35 to 40%, global hypokinesis, moderate concentric LVH, G1-DD and mildly elevated PASP.  Per cardiology, patient refused ischemic work-up or any invasive procedure.  Patient is diuresing well on IV Lasix.  Started on GDMT per cardiology.  Oncology consulted and discontinued his Zytiga as it contributed to his lower extremity edema.  Palliative medicine following as well.  Subjective: Seen and examined earlier this morning.  No major events overnight or this morning.  He denies chest pain, dyspnea, GI or UTI symptoms.  However, he feels anxious about going home yet.  He says he felt "woozy" when he tried to get up with therapy yesterday.  However, he denies lightheadedness or vertigo.  He would like to work with therapy again today.   Objective: Vitals:   01/29/20 2132 01/30/20 0619 01/30/20 1224 01/30/20 1225  BP: 126/74 129/74 132/66   Pulse: 80 75 84   Resp: 18 18 18    Temp: 98.2 F (36.8 C) 98.3 F (36.8 C)  (!) 97.5 F (36.4 C)  TempSrc: Oral   Oral  SpO2: 97% 97% 96%   Weight:      Height:        Intake/Output Summary (Last 24 hours) at 01/30/2020 1257 Last data filed at 01/30/2020 1200 Gross per 24 hour  Intake 240 ml  Output 1350 ml  Net -1110 ml   Filed Weights   01/27/20 0500 01/28/20 0500 01/29/20 0513  Weight: 83.7 kg 80.9 kg 80.2 kg    Examination:  GENERAL: No apparent distress.  Nontoxic. HEENT: MMM.  Vision and hearing grossly intact.  NECK:  Supple.  No apparent JVD.  RESP: On room air.  No IWOB.  Fair aeration bilaterally. CVS:  RRR. Heart sounds normal.  ABD/GI/GU: BS+. Abd soft, NTND.  MSK/EXT:  Moves extremities. No apparent deformity. No edema.  TED hose in place. SKIN: no apparent skin lesion or wound NEURO: Awake, alert and oriented appropriately.  No apparent focal neuro deficit. PSYCH: Calm. Normal affect.   Procedures:  None  Microbiology summarized: COVID-19 PCR negative.  Assessment & Plan: New onset acute combined CHF: patient presented with acute on chronic BLE edema.  Multiple risk factors including dietary indiscretion, sedentary lifestyle, cardiac disease and medications.  Echocardiogram as above.  Responded well to IV Lasix.  Net -7.3L.  Edema resolved.  Zytiga discontinued.  Prednisone reduced to 5 mg daily.  -Cardiology managing-transitioned to p.o. Lasix -GDMT-BiDil and Coreg.  No Entresto/ACE inhibitor/ARB/Aldactone given CKD-3B. -Monitor fluid status, renal function and electrolytes -Counseled on sodium and fluid restrictions. -Could benefit from home health nurse for CHF management -Continue PT/OT  Elevated troponin: Likely demand ischemia in the setting of CHF.  No chest pain.  Also not interested in ischemic work-up -Cardiac meds as above.  Also on aspirin.  History of CAD s/p LAD stent in 2012.  No chest pain.  Elevated troponin likely demand ischemia. -Cardiac meds as above. -Intolerance to statin.  Consider PCSK9 inhibitors.   Essential hypertension:  Normotensive -Cardiac meds as above.  E. coli UTI: Urine culture with pansensitive E. coli.  Symptoms resolved. -Completed 3 days of ceftriaxone  CKD-3B: Baseline creatinine 2.0-2.2.  Stable. -Continue home Os-Cal -Continue monitoring while on diuretics -Would benefit from nephrology referral given associated anemia  Hypocalcemia/hypophosphatemia-likely from prostate cancer. -IV sodium phosphate and calcium gluconate  Metastatic  prostate cancer with metastasis to bone: On Zytiga.  -Oncology consulted.  Zytiga discontinued. -Reduced prednisone to 5 mg daily -Continue home Flomax  Osteoarthritis -Tylenol 500 mg every 8 hours while awake -Adjust prednisone as above  Anemia of chronic disease: Baseline Hgb 9-10> 9.6 (admit)> 9.2.  Anemia panel suggests anemia of chronic disease -Continue monitoring  Debility/physical deconditioning: Uses walker at baseline. -PT/OT   Body mass index is 27.69 kg/m.         DVT prophylaxis:  enoxaparin (LOVENOX) injection 30 mg Start: 01/26/20 1000  Code Status: Full code Family Communication: Updated patient's daughter at bedside Status is: Inpatient  Remains inpatient appropriate because:Inpatient level of care appropriate due to severity of illness   Dispo: The patient is from: Home              Anticipated d/c is to: Home              Anticipated d/c date is: 1 day              Patient currently is not medically stable to d/c.       Consultants:  Cardiology Oncology   Sch Meds:  Scheduled Meds:  aspirin EC  81 mg Oral Daily   calcium carbonate  1 tablet Oral BID WC   carvedilol  12.5 mg Oral BID WC   enoxaparin (LOVENOX) injection  30 mg Subcutaneous Q24H   fentaNYL  1 patch Transdermal Q72H   [START ON 01/31/2020] furosemide  40 mg Oral Daily   isosorbide-hydrALAZINE  1 tablet Oral BID   [START ON 01/31/2020] predniSONE  5 mg Oral Q breakfast   sodium chloride flush  3 mL Intravenous Q12H   tamsulosin  0.4 mg Oral BID   Continuous Infusions:  sodium chloride     sodium phosphate  Dextrose 5% IVPB 20 mmol (01/30/20 0915)   PRN Meds:.sodium chloride, acetaminophen, nitroGLYCERIN, ondansetron (ZOFRAN) IV, oxyCODONE, polyethylene glycol, sodium chloride flush  Antimicrobials: Anti-infectives (From admission, onward)   Start     Dose/Rate Route Frequency Ordered Stop   01/26/20 2000  cefTRIAXone (ROCEPHIN) 1 g in sodium chloride 0.9 %  100 mL IVPB  Status:  Discontinued        1 g 200 mL/hr over 30 Minutes Intravenous Every 24 hours 01/26/20 1524 01/28/20 1629   01/25/20 1930  cefTRIAXone (ROCEPHIN) 1 g in sodium chloride 0.9 % 100 mL IVPB        1 g 200 mL/hr over 30 Minutes Intravenous  Once 01/25/20 1926 01/25/20 2239       I have personally reviewed the following labs and images: CBC: Recent Labs  Lab 01/25/20 1633 01/28/20 0942 01/30/20 0507  WBC 5.7 5.4  --   HGB 9.6* 9.2* 9.0*  HCT 31.3* 29.3* 28.8*  MCV 106.8* 105.8*  --   PLT 152 154  --    BMP &GFR Recent Labs  Lab 01/26/20 0452 01/27/20 0454 01/28/20 0344 01/29/20 0545 01/30/20 0507  NA 140 138 140 138 139  K 4.0 3.9 3.9 3.7 4.0  CL 104 103 102 100 99  CO2 27 27 28 29  29  GLUCOSE 99 129* 125* 102* 123*  BUN 29* 32* 35* 33* 39*  CREATININE 2.01* 2.22* 2.18* 2.14* 2.05*  CALCIUM 7.9* 7.0* 6.8* 6.4* 6.7*  MG 2.4  --   --   --  1.9  PHOS  --   --   --   --  1.6*   Estimated Creatinine Clearance: 25.7 mL/min (A) (by C-G formula based on SCr of 2.05 mg/dL (H)). Liver & Pancreas: Recent Labs  Lab 01/25/20 1812 01/29/20 0545 01/30/20 0507  AST 23  --   --   ALT 13  --   --   ALKPHOS 188*  --   --   BILITOT 0.9  --   --   PROT 6.1*  --   --   ALBUMIN 3.3* 2.7* 2.8*   No results for input(s): LIPASE, AMYLASE in the last 168 hours. No results for input(s): AMMONIA in the last 168 hours. Diabetic: No results for input(s): HGBA1C in the last 72 hours. No results for input(s): GLUCAP in the last 168 hours. Cardiac Enzymes: No results for input(s): CKTOTAL, CKMB, CKMBINDEX, TROPONINI in the last 168 hours. No results for input(s): PROBNP in the last 8760 hours. Coagulation Profile: No results for input(s): INR, PROTIME in the last 168 hours. Thyroid Function Tests: No results for input(s): TSH, T4TOTAL, FREET4, T3FREE, THYROIDAB in the last 72 hours. Lipid Profile: Recent Labs    01/29/20 0545  CHOL 210*  HDL 51  LDLCALC 129*    TRIG 149  CHOLHDL 4.1   Anemia Panel: No results for input(s): VITAMINB12, FOLATE, FERRITIN, TIBC, IRON, RETICCTPCT in the last 72 hours. Urine analysis:    Component Value Date/Time   COLORURINE YELLOW 01/25/2020 1619   APPEARANCEUR HAZY (A) 01/25/2020 1619   LABSPEC 1.018 01/25/2020 1619   PHURINE 5.0 01/25/2020 1619   GLUCOSEU NEGATIVE 01/25/2020 1619   HGBUR SMALL (A) 01/25/2020 1619   BILIRUBINUR NEGATIVE 01/25/2020 1619   KETONESUR NEGATIVE 01/25/2020 1619   PROTEINUR NEGATIVE 01/25/2020 1619   UROBILINOGEN 1.0 05/06/2012 2022   NITRITE NEGATIVE 01/25/2020 1619   LEUKOCYTESUR MODERATE (A) 01/25/2020 1619   Sepsis Labs: Invalid input(s): PROCALCITONIN, [place]  Microbiology: Recent Results (from the past 240 hour(s))  Urine C&S     Status: Abnormal   Collection Time: 01/25/20  4:19 PM   Specimen: Urine, Random  Result Value Ref Range Status   Specimen Description   Final    URINE, RANDOM Performed at [facility] [address], [place], [place] [address]    Special Requests   Final    NONE Performed at [facility], [place] [address] [place], [place] [address]    Culture >=100,000 COLONIES/mL ESCHERICHIA COLI (A)  Final   Report Status 01/28/2020 FINAL  Final   Organism ID, Bacteria ESCHERICHIA COLI (A)  Final      Susceptibility   Escherichia coli - MIC*    AMPICILLIN <=2 SENSITIVE Sensitive     CEFAZOLIN <=4 SENSITIVE Sensitive     CEFTRIAXONE <=0.25 SENSITIVE Sensitive     CIPROFLOXACIN <=0.25 SENSITIVE Sensitive     GENTAMICIN <=1 SENSITIVE Sensitive     IMIPENEM 0.5 SENSITIVE Sensitive     NITROFURANTOIN <=16 SENSITIVE Sensitive     TRIMETH/SULFA <=20 SENSITIVE Sensitive     AMPICILLIN/SULBACTAM <=2 SENSITIVE Sensitive     PIP/TAZO <=4 SENSITIVE Sensitive     * >=100,000 COLONIES/mL ESCHERICHIA COLI  Respiratory Panel by RT PCR (Flu A&B, Covid) - Nasopharyngeal Swab  Status: None   Collection Time:  01/25/20  8:22 PM   Specimen: Nasopharyngeal Swab  Result Value Ref Range Status   SARS Coronavirus 2 by RT PCR NEGATIVE NEGATIVE Final    Comment: (NOTE) SARS-CoV-2 target nucleic acids are NOT DETECTED.  The SARS-CoV-2 RNA is generally detectable in upper respiratoy specimens during the acute phase of infection. The lowest concentration of SARS-CoV-2 viral copies this assay can detect is 131 copies/mL. A negative result does not preclude SARS-Cov-2 infection and should not be used as the sole basis for treatment or other patient management decisions. A negative result may occur with  improper specimen collection/handling, submission of specimen other than nasopharyngeal swab, presence of viral mutation(s) within the areas targeted by this assay, and inadequate number of viral copies (<131 copies/mL). A negative result must be combined with clinical observations, patient history, and epidemiological information. The expected result is Negative.  Fact Sheet for Patients:  PinkCheek.be  Fact Sheet for Healthcare Providers:  GravelBags.it  This test is no t yet approved or cleared by the [place] FDA and  has been authorized for detection and/or diagnosis of SARS-CoV-2 by FDA under an Emergency Use Authorization (EUA). This EUA will remain  in effect (meaning this test can be used) for the duration of the COVID-19 declaration under Section 564(b)(1) of the Act, 21 U.S.C. section 360bbb-3(b)(1), unless the authorization is terminated or revoked sooner.     Influenza A by PCR NEGATIVE NEGATIVE Final   Influenza B by PCR NEGATIVE NEGATIVE Final    Comment: (NOTE) The Xpert Xpress SARS-CoV-2/FLU/RSV assay is intended as an aid in  the diagnosis of influenza from Nasopharyngeal swab specimens and  should not be used as a sole basis for treatment. Nasal washings and  aspirates are unacceptable for Xpert Xpress  SARS-CoV-2/FLU/RSV  testing.  Fact Sheet for Patients: PinkCheek.be  Fact Sheet for Healthcare Providers: GravelBags.it  This test is not yet approved or cleared by the [place] FDA and  has been authorized for detection and/or diagnosis of SARS-CoV-2 by  FDA under an Emergency Use Authorization (EUA). This EUA will remain  in effect (meaning this test can be used) for the duration of the  Covid-19 declaration under Section 564(b)(1) of the Act, 21  U.S.C. section 360bbb-3(b)(1), unless the authorization is  terminated or revoked. Performed at [facility], [place] [address] [place], [place] [address]     Radiology Studies: No results found.    Shemar Plemmons T. [place]  If 7PM-7AM, please contact night-coverage [email or web address] 01/30/2020, 12:57 PM

## 2020-01-30 NOTE — Progress Notes (Signed)
Physical Therapy Treatment Patient Details Name: Tony [name] MRN: [id number] DOB: [birth year] Today's Date: 01/30/2020    History of Present Illness 86 year old male with prior h/o metastatic prostate cancer to bone, CAD, stage 3 b CKD, coronary artery disease s/p MI with stent to LAD , hypertension presents to ED for bilateral lower extremity edema and admitted for acute on chronic congestive heart failure    PT Comments    Pt progressing today. Reviewed LE exercises and amb/safety. Pt is motivated Encouraged to amb once more with nursing staff later today.  Pt is also concerned about d/c and progression of activity once home; he is amenable to HHPT at d/c. Recommendations updated.  Will continue to follow in acute setting    Follow Up Recommendations  Home health PT;Supervision for mobility/OOB     Equipment Recommendations  None recommended by PT    Recommendations for Other Services       Precautions / Restrictions Precautions Precautions: Fall    Mobility  Bed Mobility Overal bed mobility: Needs Assistance Bed Mobility: Supine to Sit     Supine to sit: Min guard;HOB elevated     General bed mobility comments: min/guard to elevate trunk  Transfers Overall transfer level: Needs assistance Equipment used: 4-wheeled walker Transfers: Sit to/from Stand Sit to Stand: Min guard         General transfer comment: min/guard for safety, cues for hand placement and to control descent   Ambulation/Gait Ambulation/Gait assistance: Min Gaffer (Feet): 90 Feet Assistive device: 4-wheeled walker Gait Pattern/deviations: Step-through pattern;Decreased stride length;Trunk flexed     General Gait Details: steady with rollator, reports mild dyspnea end of ambulation, cues to stay behind RW with turns   Chief Strategy Officer    Modified Rankin (Stroke Patients Only)       Balance           Standing balance  support: No upper extremity supported Standing balance-Leahy Scale: Fair Standing balance comment: able to put on mask with both hands in standing, uses walker for ambulation                            Cognition Arousal/Alertness: Awake/alert Behavior During Therapy: WFL for tasks assessed/performed Overall Cognitive Status: Within Functional Limits for tasks assessed                                        Exercises General Exercises - Lower Extremity Long Arc Quad: AROM;Both;10 reps;Seated Hip ABduction/ADduction: AROM;Both;10 reps Hip Flexion/Marching: AROM;Both;10 reps;Seated Toe Raises: AROM;Both;10 reps;Seated Heel Raises: AROM;Both;10 reps;Seated    General Comments        Pertinent Vitals/Pain Pain Assessment: No/denies pain    Home Living                      Prior Function            PT Goals (current goals can now be found in the care plan section) Acute Rehab PT Goals PT Goal Formulation: With patient Time For Goal Achievement: 02/09/20 Potential to Achieve Goals: Good Progress towards PT goals: Progressing toward goals    Frequency    Min 3X/week      PT Plan Current plan remains appropriate    Co-evaluation  AM-PAC PT "6 Clicks" Mobility   Outcome Measure  Help needed turning from your back to your side while in a flat bed without using bedrails?: A Little Help needed moving from lying on your back to sitting on the side of a flat bed without using bedrails?: A Little Help needed moving to and from a bed to a chair (including a wheelchair)?: A Little Help needed standing up from a chair using your arms (e.g., wheelchair or bedside chair)?: A Little Help needed to walk in hospital room?: A Little Help needed climbing 3-5 steps with a railing? : A Little 6 Click Score: 18    End of Session Equipment Utilized During Treatment: Gait belt Activity Tolerance: Patient tolerated treatment  well Patient left: in chair;with call bell/phone within reach;with chair alarm set Nurse Communication: Mobility status PT Visit Diagnosis: Other abnormalities of gait and mobility (R26.89)     Time: 1540-0867 PT Time Calculation (min) (ACUTE ONLY): 24 min  Charges:  $Gait Training: 8-22 mins $Therapeutic Exercise: 8-22 mins                     Baxter Flattery, PT  Acute Rehab Dept ([place]) [phone] Pager [phone]  01/30/2020    [facility] 01/30/2020, 3:19 PM

## 2020-01-30 NOTE — Progress Notes (Signed)
Daily Progress Note   Patient Name: Tony [name]       Date: 01/30/2020 DOB: [birth year]  Age: 86 y.o. MRN#: [id number] Attending Physician: Mercy Riding, MD Primary Care Physician: [name], MD Admit Date: 01/25/2020  Reason for Consultation/Follow-up: Establishing goals of care  Subjective: Patient awake, alert, oriented. In good spirits this morning. Ate 100% of breakfast. He is eager to ambulate with nursing staff today. No complaints.   GOC:  F/u GOC with patient and daughter, Tony [name] at bedside.   Tony [name] provided patient's living will/HCPOA documentation. Copy placed in chart to be scanned into EMR.   Discussed diagnoses, interventions, plan of care including plans for outpatient oncology and cardiology follow-ups. Patient/daughter interested in home health services.   Discussed patient's wishes in regards to aggressive medical interventions (cpr/shock/ventilator). Discussed medical recommendation against aggressive measures if he should naturally pass, as these interventions will likely be more futile/cause more harm with underlying cancer, heart failure, and age/frailty. Patient agrees that when his time comes, he would wish to pass naturally and peacefully.   Patient/daughter ready to complete MOST form this morning. Decisions include: DNR/DNI, limited additional interventions including rehospitalization, CPAP/BiPAP if indicated, IVF/ABX if indicated, and NO feeding tube. Electronic [employer]yn[employer] MOST form completed. Durable DNR completed. Daughter understands and respects her father's decisions. Discussed continued medical management and treating the treatable. Copies of MOST and durable DNR placed in chart and given to daughter.   Answered questions. Daughter has PMT contact  information. Lovely patient and daughter.    Length of Stay: 4  Current Medications: Scheduled Meds:  . aspirin EC  81 mg Oral Daily  . calcium carbonate  1 tablet Oral BID WC  . carvedilol  12.5 mg Oral BID WC  . enoxaparin (LOVENOX) injection  30 mg Subcutaneous Q24H  . fentaNYL  1 patch Transdermal Q72H  . [START ON 01/31/2020] furosemide  40 mg Oral Daily  . isosorbide-hydrALAZINE  1 tablet Oral BID  . predniSONE  5 mg Oral BID WC  . sodium chloride flush  3 mL Intravenous Q12H  . tamsulosin  0.4 mg Oral BID    Continuous Infusions: . sodium chloride    . sodium phosphate  Dextrose 5% IVPB 20 mmol (01/30/20 0915)    PRN Meds: sodium chloride, acetaminophen, nitroGLYCERIN, ondansetron (ZOFRAN) IV,  oxyCODONE, polyethylene glycol, sodium chloride flush  Physical Exam Vitals and nursing note reviewed.  Constitutional:      General: He is awake.  HENT:     Head: Normocephalic and atraumatic.  Pulmonary:     Effort: No tachypnea, accessory muscle usage or respiratory distress.  Skin:    General: Skin is warm and dry.  Neurological:     Mental Status: He is alert and oriented to person, place, and time.            Vital Signs: BP 129/74 (BP Location: Right Arm)   Pulse 75   Temp 98.3 F (36.8 C)   Resp 18   Ht 5\' 7"  (1.702 m)   Wt 80.2 kg   SpO2 97%   BMI 27.69 kg/m  SpO2: SpO2: 97 % O2 Device: O2 Device: Room Air O2 Flow Rate:    Intake/output summary:   Intake/Output Summary (Last 24 hours) at 01/30/2020 1003 Last data filed at 01/30/2020 0620 Gross per 24 hour  Intake --  Output 1400 ml  Net -1400 ml   LBM: Last BM Date: 01/28/20 Baseline Weight: Weight: 85.7 kg Most recent weight: Weight: 80.2 kg       Palliative Assessment/Data: PPS 60%    Flowsheet Rows     Most Recent Value  Intake Tab  Referral Department Critical care  Unit at Time of Referral Med/Surg Unit  Palliative Care Primary Diagnosis Cancer  Date Notified 01/26/20  Palliative  Care Type New Palliative care  Reason for referral Clarify Goals of Care  Date of Admission 01/25/20  Date first seen by Palliative Care 01/27/20  # of days Palliative referral response time 1 Day(s)  # of days IP prior to Palliative referral 1  Clinical Assessment  Psychosocial & Spiritual Assessment  Palliative Care Outcomes      Patient Active Problem List   Diagnosis Date Noted  . Peripheral edema   . Palliative care by specialist   . Goals of care, counseling/discussion   . Acute on chronic congestive heart failure ([place]) 01/26/2020  . Bilateral leg edema 01/26/2020  . Osteoarthritis of knee 07/16/2019  . Prostate cancer metastatic to bone ([place]) 08/07/2017  . Macrocytic anemia 07/20/2017  . AKI (acute kidney injury) ([place]) 07/20/2017  . Thrombocytopenia ([place]) 07/20/2017  . Claudication of both lower extremities ([place]) 07/20/2017  . Anemia   . History of complete AV block 10/30/2016  . Venous insufficiency 10/30/2016  . Chronic kidney disease, stage 3b ([place]) 10/30/2016  . Essential hypertension   . Hyperlipidemia   . BPH associated with nocturia   . Coronary artery disease involving native coronary artery of native heart without angina pectoris   . History of stroke   . Gout     Palliative Care Assessment & Plan   Patient Profile: 86 y.o. male  with past medical history of metastatic prostate cancer to bone, CAD s/p MI with stent placement 2012, CKD stage IIIB, HTN, anemia admitted on 01/25/2020 with progressive bilateral lower extremity edema for several months. Followed by Dr. [name] for metastatic prostate cancer on Zytiga. Hospital admission for acute on chronic CHF found to have EF 35-40% receiving lasix. Palliative medicine consultation for goals of care.   Assessment: Metastatic prostate cancer to bone New onset acute systolic and diastolic heart failure Hx of CAD s/p PCI E. Coli UTI CKD stage IIIb Anemia of chronic disease  Recommendations/Plan:  Appreciate  oncology and cardiology recommendations.   Continue current plan of care and medical  management.  Patient plans for outpatient oncology and cardiology f/u.   HCPOA/living will documentation placed in paper chart to be scanned into EMR. Daughter, Tony [name] is documented HCPOA.  Patient/daughter ready to complete MOST form today. Patient's decisions include: DNR/DNI, limited additional interventions, IVF/ABX if indicated, and NO feeding tube. Electronic Vynca MOST completed. Durable DNR completed.   TOC team consult to assist with home health services on discharge.   Code Status: DNR/DNI   Code Status Orders  (From admission, onward)         Start     Ordered   01/26/20 0139  Full code  Continuous        01/26/20 0142        Code Status History    Date Active Date Inactive Code Status Order ID Comments User Context   07/20/2017 2021 07/22/2017 2144 Full Code [id number]  Wouk, Ailene Rud, MD Inpatient   Advance Care Planning Activity       Prognosis:   Unable to determine  Discharge Planning:  Home with [place] was discussed with RN, patient, daughter Tony [name]), updated Dr. Cyndia Skeeters via secure chat  Thank you for allowing the Palliative Medicine Team to assist in the care of this patient.   Total Time 40 Prolonged Time Billed no    Greater than 50% of this time was spent counseling and coordinating care related to the above assessment and plan.   Ihor Dow, DNP, FNP-C Palliative Medicine Team  Phone: [phone] Fax: [phone]  Please contact Palliative Medicine Team phone at [phone] for questions and concerns.

## 2020-01-30 NOTE — Progress Notes (Addendum)
Progress Note  Patient Name: Tony [name] Date of Encounter: 01/30/2020  [facility] Cardiologist: Larae Grooms, MD   Subjective   Patient put out 1.4L urine overnight. Creatinine continues to improve. Patient ambulated yesterday and felt breathing was a little short, can repeat today. No chest pain. Euvolemic on exam.   Inpatient Medications    Scheduled Meds: . aspirin EC  81 mg Oral Daily  . calcium carbonate  1 tablet Oral BID WC  . carvedilol  12.5 mg Oral BID WC  . enoxaparin (LOVENOX) injection  30 mg Subcutaneous Q24H  . fentaNYL  1 patch Transdermal Q72H  . furosemide  40 mg Intravenous BID  . isosorbide-hydrALAZINE  1 tablet Oral BID  . predniSONE  5 mg Oral BID WC  . sodium chloride flush  3 mL Intravenous Q12H  . tamsulosin  0.4 mg Oral BID   Continuous Infusions: . sodium chloride    . calcium gluconate    . sodium phosphate  Dextrose 5% IVPB     PRN Meds: sodium chloride, acetaminophen, nitroGLYCERIN, ondansetron (ZOFRAN) IV, oxyCODONE, polyethylene glycol, sodium chloride flush   Vital Signs    Vitals:   01/29/20 1407 01/29/20 2132 01/29/20 2132 01/30/20 0619  BP: 124/63  126/74 129/74  Pulse: 85  80 75  Resp: _0 Temp: 98.1 F (36.7 C) 98.2 F (36.8 C) 98.2 F (36.8 C) 98.3 F (36.8 C)  TempSrc: Oral Oral Oral   SpO2: 99%  97% 97%  Weight:      Height:        Intake/Output Summary (Last 24 hours) at 01/30/2020 0736 Last data filed at 01/30/2020 5883 Gross per 24 hour  Intake --  Output 1400 ml  Net -1400 ml   Last 3 Weights 01/29/2020 01/28/2020 01/27/2020  Weight (lbs) 176 lb 12.9 oz 178 lb 5.6 oz 184 lb 8.4 oz  Weight (kg) 80.2 kg 80.9 kg 83.7 kg      Telemetry    NSR, HR 60-70, PVCs - Personally Reviewed  ECG    No new - Personally Reviewed  Physical Exam   GEN: No acute distress.   Neck: No JVD Cardiac: RRR, no murmurs, rubs, or gallops.  Respiratory: Clear to auscultation bilaterally. GI: Soft, nontender,  non-distended  MS: No edema; No deformity. Neuro:  Nonfocal  Psych: Normal affect   Labs    High Sensitivity Troponin:   Recent Labs  Lab 01/25/20 1817 01/25/20 2014  TROPONINIHS 22* 25*      Chemistry Recent Labs  Lab 01/25/20 1812 01/26/20 0452 01/28/20 0344 01/29/20 0545 01/30/20 0507  NA  --    < > 140 138 139  K  --    < > 3.9 3.7 4.0  CL  --    < > 102 100 99  CO2  --    < > _1 GLUCOSE  --    < > 125* 102* 123*  BUN  --    < > 35* 33* 39*  CREATININE  --    < > 2.18* 2.14* 2.05*  CALCIUM  --    < > 6.8* 6.4* 6.7*  PROT 6.1*  --   --   --   --   ALBUMIN 3.3*  --   --  2.7* 2.8*  AST 23  --   --   --   --   ALT 13  --   --   --   --  ALKPHOS 188*  --   --   --   --   BILITOT 0.9  --   --   --   --   GFRNONAA  --    < > 26* 27* 28*  GFRAA  --    < > 30* 31* 33*  ANIONGAP  --    < > _0 < > = values in this interval not displayed.     Hematology Recent Labs  Lab 01/25/20 1633 01/26/20 0452 01/28/20 0942 01/30/20 0507  WBC 5.7  --  5.4  --   RBC 2.93* 2.88* 2.77*  --   HGB 9.6*  --  9.2* 9.0*  HCT 31.3*  --  29.3* 28.8*  MCV 106.8*  --  105.8*  --   MCH 32.8  --  33.2  --   MCHC 30.7  --  31.4  --   RDW 15.6*  --  15.2  --   PLT 152  --  154  --     BNP Recent Labs  Lab 01/25/20 1633  BNP 662.0*     DDimer No results for input(s): DDIMER in the last 168 hours.   Radiology    No results found.  Cardiac Studies   Echo 01/26/20 1. Left ventricular ejection fraction, by estimation, is 35 to 40%. The  left ventricle has moderately decreased function. The left ventricle  demonstrates global hypokinesis. There is moderate concentric left  ventricular hypertrophy. Left ventricular  diastolic parameters are consistent with Grade I diastolic dysfunction  (impaired relaxation).  2. Right ventricular systolic function is mildly reduced. The right  ventricular size is mildly enlarged. There is mildly elevated pulmonary  artery  systolic pressure. The estimated right ventricular systolic  pressure is 40.9 mmHg.  3. The mitral valve is normal in structure. Mild mitral valve  regurgitation. No evidence of mitral stenosis.  4. Tricuspid valve regurgitation is moderate.  5. The aortic valve is normal in structure. Aortic valve regurgitation is  mild. No aortic stenosis is present.  6. The inferior vena cava is normal in size with greater than 50%  respiratory variability, suggesting right atrial pressure of 3 mmHg.   Patient Profile     86 y.o. male with a historyof CAD s/p prior stenting to LAD in 2012 with occluded RCA with collaterals also noted at that time, prior stroke in 2012, hypertension, hyperlipidemia with intolerance to statins, CKD stage IV, BPH, gout, chronic anemia, and stage IV prostate cancer with metastasis to bonewho is being seen today for the evaluation ofCHF.  Assessment & Plan    New onset systolic CHF - BNP elevated in the 600. CXR showed cardiomegaly but no edema - Echo showed LVEF 35-40% (down from 50% at time of MI) with moderate LVH and global hypokinesis. RV also mildly enlarged with mildly reduced systolic function and mildly elevated PASP 53mHg - Patient does not want cath or invasive procedures so no plan for Myoview. He is not having chest pain  -  possible that prednisone and zytiga are contributing to volume retention.  - On lasix 254m>increased to 4061mID -continue Bidil. Toprol was changed to coreg - No ACE/ARB/Entresto with CKD - encouraged low salt diet and fluid restriction - Overnight he put out 1.4L. net -7.2L. Weight is down 2 lbs since yesterday - creatinine improving with diuresis, 2.18>2.14>2.05. Will switch to PO lasix 40 mg today - dietician consulted for CHF diet  CAD s/p stenting to LAD  in 2012/Suspected demand ischemia - NSTEMI in 2012 with stenting to LAD also noted to have RCA with collaterals - HS troponin minimally elevated and flat, suspect  demand ischemia - EKG with no ischemic changes - continue aspirin, BB. Intolerant to statin - Plan for medical management as above  HTN - Continue BB and Bidil - BP today 129/74  HLD - No statin due to intolerance due to myalgias -LDL 129  CKD stage IV - creatinine 2stable. Baseline 1.8-2.1 - continue to monitor with diuresis  Elevated alk phos - chronic from bone metastases  Chronic anemia - Hgb 9.6 on admission - Hgb so far stable  Goals of care - palliative consulted  For questions or updates, please contact [place] Please consult [email or web address] for contact info under        Signed, Satchel Heidinger Ninfa Meeker, PA-C  01/30/2020, 7:36 AM

## 2020-01-30 NOTE — Progress Notes (Signed)
Nutrition Education Note  RD consulted for low sodium diet education.   Patient has medical history which includes stage 3 CKD and HTN. He was admitted with acute CHF.   RD provided "Low Sodium Nutrition Therapy," "Sodium Content of Foods," and "Sodium Free Flavoring Tips" handouts from the Academy of [employer] and Dietetics. Reviewed patient's dietary recall. Provided examples on ways to decrease sodium intake in diet. Discouraged intake of processed foods and use of salt shaker. Encouraged fresh fruits and vegetables as well as whole grain sources of carbohydrates to maximize fiber intake.  Patient avoids canned items and uses fresh or frozen vegetables. He is very open to using seasoning blends and single herbs and spices in order to cut back on the use of salt in cooking.    RD discussed why it is important for patient to adhere to diet recommendations, and emphasized the role of fluids, foods to avoid, and importance of weighing self daily. Teach back method used.  Expect good compliance.  Body mass index is 27.69 kg/m. Pt meets criteria for overweight based on current BMI.  Current diet order is Heart Healthy and he has been eating mainly 50-100% of meals. Labs and medications reviewed. No further nutrition interventions warranted at this time. RD contact information provided. If additional nutrition issues arise, please re-consult RD.      Tony Matin, MS, RD, LDN, CNSC Inpatient Clinical Dietitian RD pager # available in [place]  After hours/weekend pager # available in [facility]

## 2020-01-31 LAB — RENAL FUNCTION PANEL
Albumin: 2.8 g/dL — ABNORMAL LOW (ref 3.5–5.0)
Anion gap: 10 (ref 5–15)
BUN: 41 mg/dL — ABNORMAL HIGH (ref 8–23)
CO2: 29 mmol/L (ref 22–32)
Calcium: 6.9 mg/dL — ABNORMAL LOW (ref 8.9–10.3)
Chloride: 100 mmol/L (ref 98–111)
Creatinine, Ser: 2 mg/dL — ABNORMAL HIGH (ref 0.61–1.24)
GFR calc Af Amer: 34 mL/min — ABNORMAL LOW (ref >60)
GFR calc non Af Amer: 29 mL/min — ABNORMAL LOW (ref >60)
Glucose, Bld: 107 mg/dL — ABNORMAL HIGH (ref 70–99)
Phosphorus: 2 mg/dL — ABNORMAL LOW (ref 2.5–4.6)
Potassium: 3.8 mmol/L (ref 3.5–5.1)
Sodium: 139 mmol/L (ref 135–145)

## 2020-01-31 LAB — HEMOGLOBIN AND HEMATOCRIT, BLOOD
HCT: 28.8 % — ABNORMAL LOW (ref 39.0–52.0)
Hemoglobin: 8.8 g/dL — ABNORMAL LOW (ref 13.0–17.0)

## 2020-01-31 LAB — MAGNESIUM: Magnesium: 2.1 mg/dL (ref 1.7–2.4)

## 2020-01-31 MED ORDER — CARVEDILOL 12.5 MG PO TABS
12.5000 mg | ORAL_TABLET | Freq: Two times a day (BID) | ORAL | 1 refills | Status: DC
Start: 2020-01-31 — End: 2020-05-10

## 2020-01-31 MED ORDER — MAGNESIUM SULFATE IN D5W 1-5 GM/100ML-% IV SOLN
1.0000 g | Freq: Once | INTRAVENOUS | Status: AC
  Administered 2020-01-31: 1 g via INTRAVENOUS
  Filled 2020-01-31: qty 100

## 2020-01-31 MED ORDER — PREDNISONE 5 MG PO TABS: 5.0000 mg | ORAL_TABLET | Freq: Every day | ORAL | 8 refills | Status: DC

## 2020-01-31 MED ORDER — FUROSEMIDE 40 MG PO TABS
40.0000 mg | ORAL_TABLET | Freq: Every day | ORAL | 0 refills | Status: DC
Start: 2020-01-31 — End: 2020-02-20

## 2020-01-31 NOTE — Progress Notes (Signed)
Pt states daughter is at home setting up for his discharge and will pick hi up around 4 pm today SRP, RN

## 2020-01-31 NOTE — Progress Notes (Signed)
Physical Therapy Treatment Patient Details Name: Tony [name] MRN: [id number] DOB: [birth year] Today's Date: 01/31/2020    History of Present Illness 86 year old male with prior h/o metastatic prostate cancer to bone, CAD, stage 3 b CKD, coronary artery disease s/p MI with stent to LAD , hypertension presents to ED for bilateral lower extremity edema and admitted for acute on chronic congestive heart failure    PT Comments    Pt progressing with improved activity tolerance, incr gait distance today. Less assist needed overall. Continue to recommend HHPT  Follow Up Recommendations  Home health PT;Supervision for mobility/OOB     Equipment Recommendations  None recommended by PT    Recommendations for Other Services       Precautions / Restrictions Precautions Precautions: Fall Restrictions Weight Bearing Restrictions: No    Mobility  Bed Mobility Overal bed mobility: Needs Assistance Bed Mobility: Supine to Sit     Supine to sit: HOB elevated;Supervision     General bed mobility comments: for safety   Transfers Overall transfer level: Needs assistance Equipment used: 4-wheeled walker Transfers: Sit to/from Stand Sit to Stand: Supervision         General transfer comment: supervision  for safety, cues for hand placement and to control descent   Ambulation/Gait Ambulation/Gait assistance: Supervision Gait Distance (Feet): 120 Feet Assistive device: 4-wheeled walker Gait Pattern/deviations: Step-through pattern;Decreased stride length;Trunk flexed     General Gait Details: steady with rollator, improved safety with turns   Stairs             Wheelchair Mobility    Modified Rankin (Stroke Patients Only)       Balance           Standing balance support: No upper extremity supported Standing balance-Leahy Scale: Fair Standing balance comment: able to put on mask with both hands in standing, uses walker for ambulation                             Cognition Arousal/Alertness: Awake/alert Behavior During Therapy: WFL for tasks assessed/performed Overall Cognitive Status: Within Functional Limits for tasks assessed                                        Exercises      General Comments        Pertinent Vitals/Pain Pain Assessment: No/denies pain    Home Living                      Prior Function            PT Goals (current goals can now be found in the care plan section) Acute Rehab PT Goals PT Goal Formulation: With patient Time For Goal Achievement: 02/09/20 Potential to Achieve Goals: Good Progress towards PT goals: Progressing toward goals    Frequency    Min 3X/week      PT Plan Current plan remains appropriate    Co-evaluation              AM-PAC PT "6 Clicks" Mobility   Outcome Measure  Help needed turning from your back to your side while in a flat bed without using bedrails?: A Little Help needed moving from lying on your back to sitting on the side of a flat bed without using bedrails?: A Little Help needed moving  to and from a bed to a chair (including a wheelchair)?: A Little Help needed standing up from a chair using your arms (e.g., wheelchair or bedside chair)?: A Little Help needed to walk in hospital room?: A Little Help needed climbing 3-5 steps with a railing? : A Little 6 Click Score: 18    End of Session Equipment Utilized During Treatment: Gait belt Activity Tolerance: Patient tolerated treatment well Patient left: in bed;with bed alarm set;with call bell/phone within reach Nurse Communication: Mobility status PT Visit Diagnosis: Other abnormalities of gait and mobility (R26.89)     Time: 1000-1020 PT Time Calculation (min) (ACUTE ONLY): 20 min  Charges:  $Gait Training: 8-22 mins                     Baxter Flattery, PT  Acute Rehab Dept ([place]) [phone] Pager [phone]  01/31/2020    [facility] 01/31/2020, 10:33  AM

## 2020-01-31 NOTE — TOC Initial Note (Signed)
Transition of Care [facility]) - Initial/Assessment Note    Patient Details  Name: Tony [name] MRN: [id number] Date of Birth: 02-Apr-1931  [facility] CM/SW Contact:    Joaquin Courts, RN Phone Number: 01/31/2020, 1:13 PM  Clinical Narrative:   Patient set up with HHPT/OT with [facility].                 Expected Discharge Plan: [place] Barriers to Discharge: No Barriers Identified   Patient Goals and CMS Choice Patient states their goals for this hospitalization and ongoing recovery are:: to go home CMS Medicare.gov Compare Post Acute Care list provided to:: Patient Choice offered to / list presented to : Patient  Expected Discharge Plan and Services Expected Discharge Plan: [place]   Discharge Planning Services: CM Consult Post Acute Care Choice: [place] arrangements for the past 2 months: Single Family Home Expected Discharge Date: 01/31/20               DME Arranged: N/A DME Agency: NA       HH Arranged: PT, OT HH Agency: [facility] Date [place]: 01/31/20 Time HH Agency Contacted: 66 Representative spoke with at [place]: [place] Arrangements/Services Living arrangements for the past 2 months: [place]   Patient language and need for interpreter reviewed:: Yes Do you feel safe going back to the place where you live?: Yes      Need for Family Participation in Patient Care: Yes (Comment) Care giver support system in place?: Yes (comment)   Criminal Activity/Legal Involvement Pertinent to Current Situation/Hospitalization: No - Comment as needed  Activities of Daily Living Home Assistive Devices/Equipment: Walker (specify type), Cane (specify quad or straight) (cane with 4 little feet, 4 wheeled walker, upper/lower dentures) ADL Screening (condition at time of admission) Patient's cognitive ability adequate to safely complete daily activities?: Yes Is  the patient deaf or have difficulty hearing?: No Does the patient have difficulty seeing, even when wearing glasses/contacts?: No Does the patient have difficulty concentrating, remembering, or making decisions?: No Patient able to express need for assistance with ADLs?: Yes Does the patient have difficulty dressing or bathing?: No Independently performs ADLs?: No Communication: Independent Dressing (OT): Independent Grooming: Independent Feeding: Independent Bathing: Independent Toileting: Independent with device (comment) In/Out Bed: Independent Walks in Home: Independent with device (comment) Does the patient have difficulty walking or climbing stairs?: Yes Weakness of Legs: Both Weakness of Arms/Hands: None  Permission Sought/Granted                  Emotional Assessment Appearance:: Appears stated age         Psych Involvement: No (comment)  Admission diagnosis:  Shortness of breath [R06.02] Peripheral edema [R60.9] Dyspnea on exertion [R06.00] Bilateral leg edema [R60.0] Prostate cancer metastatic to bone ([place]) [C61, C79.51] Patient Active Problem List   Diagnosis Date Noted  . DNR (do not resuscitate)   . Peripheral edema   . Palliative care by specialist   . Goals of care, counseling/discussion   . Acute on chronic congestive heart failure ([place]) 01/26/2020  . Bilateral leg edema 01/26/2020  . Osteoarthritis of knee 07/16/2019  . Prostate cancer metastatic to bone ([place]) 08/07/2017  . Macrocytic anemia 07/20/2017  . AKI (acute kidney injury) ([place]) 07/20/2017  . Thrombocytopenia ([place]) 07/20/2017  . Claudication of both lower extremities ([place]) 07/20/2017  . Anemia   . History of complete AV  block 10/30/2016  . Venous insufficiency 10/30/2016  . Chronic kidney disease, stage 3b ([place]) 10/30/2016  . Essential hypertension   . Hyperlipidemia   . BPH associated with nocturia   . Coronary artery disease involving native coronary artery of native heart without  angina pectoris   . History of stroke   . Gout    PCP:  [name], MD Pharmacy:   [facility] [place], [employer] - [address] AT [address] [place] [address] Phone: [phone] Fax: [place], [place] - [address] [place] [address] Phone: [phone] Fax: [phone]     Social Determinants of Health (SDOH) Interventions    Readmission Risk Interventions No flowsheet data found.

## 2020-01-31 NOTE — Plan of Care (Signed)

## 2020-01-31 NOTE — Progress Notes (Addendum)
Progress Note  Patient Name: Tony [name] Date of Encounter: 01/31/2020  [facility] Cardiologist: Larae Grooms, MD   Subjective   Denies any chest pain or SOB.  LE edema improved with compression hose, diuretics and stopping Zytiga.  He put out 1.3L yesterday and is net neg 8L since admit.  Weight down 12lbs from admit.  Had 7 beats NSVT on tele > asymptomatic  Inpatient Medications    Scheduled Meds: . aspirin EC  81 mg Oral Daily  . calcium carbonate  1 tablet Oral BID WC  . carvedilol  12.5 mg Oral BID WC  . enoxaparin (LOVENOX) injection  30 mg Subcutaneous Q24H  . fentaNYL  1 patch Transdermal Q72H  . furosemide  40 mg Oral Daily  . isosorbide-hydrALAZINE  1 tablet Oral BID  . polyethylene glycol  17 g Oral Daily  . predniSONE  5 mg Oral Q breakfast  . senna-docusate  2 tablet Oral BID  . sodium chloride flush  3 mL Intravenous Q12H  . tamsulosin  0.4 mg Oral BID   Continuous Infusions: . sodium chloride     PRN Meds: sodium chloride, acetaminophen, nitroGLYCERIN, ondansetron (ZOFRAN) IV, oxyCODONE, sodium chloride flush   Vital Signs    Vitals:   01/30/20 2058 01/31/20 0344 01/31/20 0500 01/31/20 0523  BP: 130/70 129/83  132/72  Pulse: 79 81  72  Resp: $Remo'17 16  19  'dgKwh$ Temp: 98.3 F (36.8 C) 98.3 F (36.8 C)  97.9 F (36.6 C)  TempSrc: Oral Oral  Oral  SpO2: 97% 97%  99%  Weight:   80.4 kg   Height:        Intake/Output Summary (Last 24 hours) at 01/31/2020 0759 Last data filed at 01/31/2020 0500 Gross per 24 hour  Intake 630.71 ml  Output 1325 ml  Net -694.29 ml   Last 3 Weights 01/31/2020 01/29/2020 01/28/2020  Weight (lbs) 177 lb 4 oz 176 lb 12.9 oz 178 lb 5.6 oz  Weight (kg) 80.4 kg 80.2 kg 80.9 kg      Telemetry    NSR with  - Personally Reviewed  ECG    No new EKG to review - Personally Reviewed  Physical Exam   GEN: Well nourished, well developed in no acute distress HEENT: Normal NECK: No JVD; No carotid bruits LYMPHATICS: No  lymphadenopathy CARDIAC:RRR, no murmurs, rubs, gallops RESPIRATORY:  Clear to auscultation without rales, wheezing or rhonchi  ABDOMEN: Soft, non-tender, non-distended MUSCULOSKELETAL:  No edema; No deformity  SKIN: Warm and dry NEUROLOGIC:  Alert and oriented x 3 PSYCHIATRIC:  Normal affect    Labs    High Sensitivity Troponin:   Recent Labs  Lab 01/25/20 1817 01/25/20 2014  TROPONINIHS 22* 25*      Chemistry Recent Labs  Lab 01/25/20 1812 01/26/20 0452 01/29/20 0545 01/30/20 0507 01/31/20 0407  NA  --    < > 138 139 139  K  --    < > 3.7 4.0 3.8  CL  --    < > 100 99 100  CO2  --    < > $R'29 29 29  'KS$ GLUCOSE  --    < > 102* 123* 107*  BUN  --    < > 33* 39* 41*  CREATININE  --    < > 2.14* 2.05* 2.00*  CALCIUM  --    < > 6.4* 6.7* 6.9*  PROT 6.1*  --   --   --   --  ALBUMIN 3.3*  --  2.7* 2.8* 2.8*  AST 23  --   --   --   --   ALT 13  --   --   --   --   ALKPHOS 188*  --   --   --   --   BILITOT 0.9  --   --   --   --   GFRNONAA  --    < > 27* 28* 29*  GFRAA  --    < > 31* 33* 34*  ANIONGAP  --    < > $R'9 11 10   'Hd$ < > = values in this interval not displayed.     Hematology Recent Labs  Lab 01/25/20 1633 01/25/20 1633 01/26/20 0452 01/28/20 0942 01/30/20 0507 01/31/20 0407  WBC 5.7  --   --  5.4  --   --   RBC 2.93*  --  2.88* 2.77*  --   --   HGB 9.6*   < >  --  9.2* 9.0* 8.8*  HCT 31.3*   < >  --  29.3* 28.8* 28.8*  MCV 106.8*  --   --  105.8*  --   --   MCH 32.8  --   --  33.2  --   --   MCHC 30.7  --   --  31.4  --   --   RDW 15.6*  --   --  15.2  --   --   PLT 152  --   --  154  --   --    < > = values in this interval not displayed.    BNP Recent Labs  Lab 01/25/20 1633  BNP 662.0*     DDimer No results for input(s): DDIMER in the last 168 hours.   Radiology    No results found.  Cardiac Studies   Echo 01/26/20 1. Left ventricular ejection fraction, by estimation, is 35 to 40%. The  left ventricle has moderately decreased function.  The left ventricle  demonstrates global hypokinesis. There is moderate concentric left  ventricular hypertrophy. Left ventricular  diastolic parameters are consistent with Grade I diastolic dysfunction  (impaired relaxation).  2. Right ventricular systolic function is mildly reduced. The right  ventricular size is mildly enlarged. There is mildly elevated pulmonary  artery systolic pressure. The estimated right ventricular systolic  pressure is 30.0 mmHg.  3. The mitral valve is normal in structure. Mild mitral valve  regurgitation. No evidence of mitral stenosis.  4. Tricuspid valve regurgitation is moderate.  5. The aortic valve is normal in structure. Aortic valve regurgitation is  mild. No aortic stenosis is present.  6. The inferior vena cava is normal in size with greater than 50%  respiratory variability, suggesting right atrial pressure of 3 mmHg.   Patient Profile     86 y.o. male with a historyof CAD s/p prior stenting to LAD in 2012 with occluded RCA with collaterals also noted at that time, prior stroke in 2012, hypertension, hyperlipidemia with intolerance to statins, CKD stage IV, BPH, gout, chronic anemia, and stage IV prostate cancer with metastasis to bonewho is being seen today for the evaluation ofCHF.  Assessment & Plan    New onset systolic CHF - BNP elevated in the 600. CXR showed cardiomegaly but no edema - Echo showed LVEF 35-40% (down from 50% at time of MI) with moderate LVH and global hypokinesis. RV also mildly enlarged with mildly reduced systolic function and  mildly elevated PASP 77mmHg - Patient does not want cath or invasive procedures so no plan for Myoview. He is not having chest pain  -  possible that prednisone and zytiga are contributing to volume retention.  - On lasix 20mg >>increased to 40mg  BID IV - he put out 1.3L yesterday and is net neg 8L.  Weight down 12lbs from admit - SCr continues to improve with diuresis (2.18>2.14>2.02>2  today). - I think he is now euvolemic - continue Lasix 40mg  daily -continue Bidil. Toprol was changed to coreg 12.5mg  BID - No ACE/ARB/Entresto with CKD - encouraged low salt diet and fluid restriction - dietician consulted for CHF diet  CAD s/p stenting to LAD in 2012/Suspected demand ischemia - NSTEMI in 2012 with stenting to LAD also noted to have RCA with collaterals - HS troponin minimally elevated and flat, suspect demand ischemia - denies any chest pain or SOB - EKG with no ischemic changes - continue aspirin, BB. Intolerant to statin - Plan for medical management as above>>patient is not interested in pursuing any further ischemic workup  HTN - Bp controlled at 132/21mmHg - Continue Carvedilol 12.5mg  BID and Bidil 20-37.5mg  BID  HLD - No statin due to intolerance due to myalgias -LDL 129  CKD stage IV - creatinine stable. Baseline 1.8-2.1 and 2 today - continue to monitor with diuresis  Elevated alk phos - chronic from bone metastases  Chronic anemia - Hgb 9.6 on admission - Hgb so far stable at 8.8 likely related to blood draws  Goals of care - palliative consulted  NSVT - 6 beats on tele yesterday>> asymptomatic - continue to monitor - continue medical management with BB>>patient is DNR  [facility] will sign off.   Medication Recommendations:  ASA 81mg  daily, Carvedilol 12.5mg  BID, Lasix 40mg  daily, Bidil 20-37.5mg  BID Other recommendations (labs, testing, etc):  BMET in 1 week Follow up as an outpatient:  Followup in 1-2 weeks with Dr. Irish Lack  For questions or updates, please contact [place] Please consult [email or web address] for contact info under        Signed, Fransico Him, MD  01/31/2020, 7:59 AM

## 2020-01-31 NOTE — Progress Notes (Addendum)
Noted 7 runs of non sustained vtach, asymptomatic, vs checked and stable. Hospitalist oncall B. Kyere informed at [phone].   Flurin.[place]- ordered for magnesium Iv infusion x 1.

## 2020-01-31 NOTE — Discharge Summary (Signed)
Physician Discharge Summary  [name] SWF:[id number] DOB: 10-10-1930 DOA: 01/25/2020  PCP: [name], MD  Admit date: 01/25/2020 Discharge date: 01/31/2020  Admitted From: Home Disposition: Home  Recommendations for Outpatient Follow-up:  1. Follow ups as below. 2. Please obtain CBC/BMP/Mag in 1 week 3. Please follow up on the following pending results: None  Home Health: PT/OT Equipment/Devices: Patient has appropriate DME  Discharge Condition: Stable CODE STATUS: DNR/DNI   Follow-up Information    [facility] Follow up on 02/09/2020.   Specialty: Cardiology Why: Please go to [facility] on [address] any time 7:30-4:30 for blood work Sport and exercise psychologist information: [address] [place] [address] [phone]       [name], MD. Schedule an appointment as soon as possible for a visit in 2 week(s).   Specialties: Cardiology, Radiology, Interventional Cardiology Contact information: [address] [place] [address] [phone]        [name], MD. Schedule an appointment as soon as possible for a visit in 1 week(s).   Specialty: Family Medicine Contact information: [address] [place] [address] [phone]        [facility] Follow up.   Specialty: Home Health Services Why: agency will provide home health physical therapy and occupational therapy. Contact information: [place] [address] [phone]               Hospital Course: 86 year old male with history of metastatic prostate cancer to bone followed by Dr. [name], CAD s/p stent to LAD in 2012, CKD-3B, HTN, complete AV and osteoarthritis presenting with acute on chronic bilateral lower extremity edema, and admitted for new acute combined CHF.  He was started on IV Lasix.  Cardiology consulted.  Echocardiogram with LVEF of 35 to 40%, global hypokinesis, moderate  concentric LVH, G1-DD and mildly elevated PASP.  Per cardiology, patient refused ischemic work-up or any invasive procedure.  Patient is diuresing well on IV Lasix with a net negative of 8 L. Weight down from 189 to 177 pounds.  He was transitioned to p.o. Lasix 40 mg daily. Edema resolved completely. He was ambulated on room air and maintained appropriate saturation without distress. Educated on heart healthy diet including fluid and sodium restriction by dietitian. He was cleared for discharge by cardiology on oral Lasix, Coreg, BiDil and aspirin as below.    Oncology consulted and discontinued his Zytiga given risk for lower extremity edema. Also reduced prednisone to 5 mg daily.   Further treatment of his prostate cancer to be discussed at his next oncology follow-up.  Palliative medicine consulted and met with patient and patient's daughter, Tony [name] who is HCPOA. MOST form completed. CODE STATUS changed to DNR/DNI. Other patient's decisions include IVF/antibiotic if indicated but no feeding tube.   Follow-ups as above.  See individual problem list below for more hospital course.  Discharge Diagnoses:  New onset acute combined CHF: patient presented with acute on chronic BLE edema.  Multiple risk factors including dietary indiscretion, sedentary lifestyle, cardiac disease and medications.  Echo as above. Edema resolved.  Zytiga discontinued.  Prednisone reduced to 5 mg daily. Diuresed well with IV Lasix. Net -8 L. Weight down from 189 to 177 pounds. -Cleared for discharge by cardiology on p.o. Lasix, Coreg, BiDil and aspirin as below. -No Entresto/ACE inhibitor/ARB/Aldactone given CKD-3B. -Met with dietitian for education on heart healthy diet, sodium and fluid restriction. -Encouraged to keep  weight log -Outpatient follow-up as above.  Elevated troponin: Likely demand ischemia in the setting of CHF.  No chest pain.  Also not interested in ischemic work-up -Cardiac meds as above.    History of CAD s/p LAD stent in 2012.  No chest pain.  Elevated troponin likely demand ischemia. -Cardiac meds as above. -Intolerance to statin.  Consider PCSK9 inhibitors.   Essential hypertension: Normotensive -Cardiac meds as above.  E. coli UTI: Urine culture with pansensitive E. coli.  Symptoms resolved. -Completed 3 days of ceftriaxone  CKD-3B: Baseline creatinine 2.0-2.2.  Stable. Hypocalcemia/hypophosphatemia-likely from prostate cancer. Improved. -Recheck renal function in 1 week -Recommend nephrology referral given associated anemia and hypocalcemia -Continue p.o. calcium  Metastatic prostate cancer with metastasis to bone: On Zytiga and prednisone -Zytiga discontinued and prednisone reduced to 5 mg daily after discussion with oncology -Continue home Flomax -Follow-up with oncology in 1 week to discuss further treatment plan  Osteoarthritis -Tylenol 500 mg every 8 hours while awake -Continue home fentanyl and oxycodone. -Adjust prednisone as above  Anemia of chronic disease: Baseline Hgb 9-10> 9.6 (admit)>  8.8.  Anemia panel suggests anemia of chronic disease -Could benefit from nephrology referral given associated CKD-3B  Debility/physical deconditioning: Uses walker at baseline. -Home health PT/OT. Patient has appropriate DME's.   Body mass index is 27.76 kg/m.            Discharge Exam: Vitals:   01/31/20 0834 01/31/20 1457  BP: 131/69 119/60  Pulse: 78   Resp: 12 18  Temp:    SpO2: 97%     GENERAL: No apparent distress.  Nontoxic. HEENT: MMM.  Vision and hearing grossly intact.  NECK: Supple.  No apparent JVD.  RESP: On room air. No IWOB.  Fair aeration bilaterally. CVS:  RRR. Heart sounds normal.  ABD/GI/GU: Bowel sounds present. Soft. Non tender.  MSK/EXT:  Moves extremities. No apparent deformity. No edema.  SKIN: no apparent skin lesion or wound NEURO: Awake, alert and oriented appropriately.  No apparent focal neuro  deficit. PSYCH: Calm. Normal affect.  Discharge Instructions  Discharge Instructions    (HEART FAILURE PATIENTS) Call MD:  Anytime you have any of the following symptoms: 1) 3 pound weight gain in 24 hours or 5 pounds in 1 week 2) shortness of breath, with or without a dry hacking cough 3) swelling in the hands, feet or stomach 4) if you have to sleep on extra pillows at night in order to breathe.   Complete by: As directed    Call MD for:  difficulty breathing, headache or visual disturbances   Complete by: As directed    Call MD for:  extreme fatigue   Complete by: As directed    Call MD for:  persistant dizziness or light-headedness   Complete by: As directed    Diet - low sodium heart healthy   Complete by: As directed    Discharge instructions   Complete by: As directed    It has been a pleasure taking care of you!  You were hospitalized and treated for lower extremity edema and heart failure.  Your symptoms improved to the point we think is safe to let you go home and follow-up with your primary care doctor and cardiologist.  We have made some changes to your home medication during this hospitalization.  Please review your new medication list and the directions on your medications before you take them.  In regards to your heart failure,  it is important that you take  your medications as prescribed, avoid alcohol or over-the-counter pain medication other than plain Tylenol, limit the amount of water/fluid you drink to less than 6 cups (1500 cc) a day,  limit your sodium (salt) intake to less than 2 g (2000 mg) a day and weigh yourself daily at the same time and keeping your weight log.   Please go to your hospital follow-up appointments or call to schedule as recommended.   Take care,   Increase activity slowly   Complete by: As directed      Allergies as of 01/31/2020      Reactions   Gabapentin Other (See Comments)   Patient states caused hallucinations       Medication  List    STOP taking these medications   abiraterone acetate 250 MG tablet Commonly known as: ZYTIGA   gabapentin 100 MG capsule Commonly known as: Neurontin   metoprolol succinate 25 MG 24 hr tablet Commonly known as: TOPROL-XL   valACYclovir 500 MG tablet Commonly known as: Valtrex     TAKE these medications   aspirin EC 81 MG tablet Take 1 tablet (81 mg total) by mouth daily.   BiDil 20-37.5 MG tablet Generic drug: isosorbide-hydrALAZINE TAKE 1 TABLET BY MOUTH TWICE DAILY What changed: when to take this   calcium-vitamin D 500-200 MG-UNIT tablet Commonly known as: Oscal 500/200 D-3 Take 1 tablet by mouth daily with breakfast.   carvedilol 12.5 MG tablet Commonly known as: COREG Take 1 tablet (12.5 mg total) by mouth 2 (two) times daily with a meal.   fentaNYL 25 MCG/HR Commonly known as: DURAGESIC Place 1 patch onto the skin every 3 (three) days.   furosemide 40 MG tablet Commonly known as: LASIX Take 1 tablet (40 mg total) by mouth daily. What changed:   medication strength  how much to take  when to take this  reasons to take this   MULTI COMPLETE PO Take 1 tablet by mouth daily.   nitroGLYCERIN 0.4 MG SL tablet Commonly known as: NITROSTAT Place 1 tablet (0.4 mg total) under the tongue every 5 (five) minutes as needed for chest pain (3 maximum before seeking care).   Omega-3 1000 MG Caps Take 1 capsule by mouth daily.   oxyCODONE 5 MG immediate release tablet Commonly known as: Oxy IR/ROXICODONE Take 1 tablet (5 mg total) by mouth every 4 (four) hours as needed for severe pain.   polyethylene glycol 17 g packet Commonly known as: MiraLax Take 17 g by mouth daily.   predniSONE 5 MG tablet Commonly known as: DELTASONE Take 1 tablet (5 mg total) by mouth daily with breakfast. What changed:   when to take this  Another medication with the same name was removed. Continue taking this medication, and follow the directions you see here.    senna-docusate 8.6-50 MG tablet Commonly known as: Senna S Take 2 tablets by mouth 2 (two) times daily.   tamsulosin 0.4 MG Caps capsule Commonly known as: FLOMAX TAKE 1 CAPSULE BY MOUTH TWICE DAILY What changed: when to take this   vitamin B-12 1000 MCG tablet Commonly known as: CYANOCOBALAMIN Take 1 tablet (1,000 mcg total) by mouth daily.   Vitamin D (Ergocalciferol) 1.25 MG (50000 UNIT) Caps capsule Commonly known as: DRISDOL Take 1 capsule Monday, Wednesday and Friday.       Consultations:  Cardiology  Oncology  Palliative medicine  Procedures/Studies:  2D Echo on 01/26/2020 1. Left ventricular ejection fraction, by estimation, is 35 to 40%. The  left  ventricle has moderately decreased function. The left ventricle  demonstrates global hypokinesis. There is moderate concentric left  ventricular hypertrophy. Left ventricular  diastolic parameters are consistent with Grade I diastolic dysfunction  (impaired relaxation).  2. Right ventricular systolic function is mildly reduced. The right  ventricular size is mildly enlarged. There is mildly elevated pulmonary  artery systolic pressure. The estimated right ventricular systolic  pressure is 83.3 mmHg.  3. The mitral valve is normal in structure. Mild mitral valve  regurgitation. No evidence of mitral stenosis.  4. Tricuspid valve regurgitation is moderate.  5. The aortic valve is normal in structure. Aortic valve regurgitation is  mild. No aortic stenosis is present.  6. The inferior vena cava is normal in size with greater than 50%  respiratory variability, suggesting right atrial pressure of 3 mmHg.    DG Chest 2 View  Result Date: 01/25/2020 CLINICAL DATA:  Patient with bilateral feet and leg swelling. EXAM: CHEST - 2 VIEW COMPARISON:  Chest radiograph January 25, 2020 FINDINGS: Monitoring leads overlie the patient. Stable cardiomegaly. No large area pulmonary consolidation. No pleural effusion or  pneumothorax. Patchy sclerotic osseous changes throughout the axial and appendicular skeleton compatible with history of metastatic prostate cancer. IMPRESSION: Cardiomegaly. No acute cardiopulmonary process. Electronically Signed   By: Lovey Newcomer M.D.   On: 01/25/2020 18:49   ECHOCARDIOGRAM COMPLETE  Result Date: 01/26/2020    ECHOCARDIOGRAM REPORT   Patient Name:   Tony [name] [facility] Date of Exam: 01/26/2020 Medical Rec #:  [id number]      Height:       67.0 in Accession #:    [id number]     Weight:       189.0 lb Date of Birth:  December 29, 1930      BSA:          1.974 m Patient Age:    21 years       BP:           147/81 mmHg Patient Gender: M              HR:           72 bpm. Exam Location:  Inpatient Procedure: 2D Echo and Intracardiac Opacification Agent Indications:    CHF- Acute Systolic I09.73  History:        Patient has prior history of Echocardiogram examinations, most                 recent 03/02/2011. CAD; Risk Factors:Hypertension and                 Dyslipidemia.  Sonographer:    Mikki Santee RDCS (AE) Referring Phys: [id number] [place]  1. Left ventricular ejection fraction, by estimation, is 35 to 40%. The left ventricle has moderately decreased function. The left ventricle demonstrates global hypokinesis. There is moderate concentric left ventricular hypertrophy. Left ventricular diastolic parameters are consistent with Grade I diastolic dysfunction (impaired relaxation).  2. Right ventricular systolic function is mildly reduced. The right ventricular size is mildly enlarged. There is mildly elevated pulmonary artery systolic pressure. The estimated right ventricular systolic pressure is 26.8 mmHg.  3. The mitral valve is normal in structure. Mild mitral valve regurgitation. No evidence of mitral stenosis.  4. Tricuspid valve regurgitation is moderate.  5. The aortic valve is normal in structure. Aortic valve regurgitation is mild. No aortic stenosis is present.  6. The inferior  vena cava is normal in size with greater than 50% respiratory  variability, suggesting right atrial pressure of 3 mmHg. FINDINGS  Left Ventricle: Left ventricular ejection fraction, by estimation, is 35 to 40%. The left ventricle has moderately decreased function. The left ventricle demonstrates global hypokinesis. Definity contrast agent was given IV to delineate the left ventricular endocardial borders. The left ventricular internal cavity size was normal in size. There is moderate concentric left ventricular hypertrophy. Abnormal (paradoxical) septal motion, consistent with left bundle branch block. Left ventricular diastolic parameters are consistent with Grade I diastolic dysfunction (impaired relaxation). Normal left ventricular filling pressure. Right Ventricle: The right ventricular size is mildly enlarged. No increase in right ventricular wall thickness. Right ventricular systolic function is mildly reduced. There is mildly elevated pulmonary artery systolic pressure. The tricuspid regurgitant  velocity is 3.14 m/s, and with an assumed right atrial pressure of 3 mmHg, the estimated right ventricular systolic pressure is 26.9 mmHg. Left Atrium: Left atrial size was normal in size. Right Atrium: Right atrial size was normal in size. Pericardium: There is no evidence of pericardial effusion. Mitral Valve: The mitral valve is normal in structure. There is mild thickening of the mitral valve leaflet(s). Mild mitral annular calcification. Mild mitral valve regurgitation. No evidence of mitral valve stenosis. Tricuspid Valve: The tricuspid valve is normal in structure. Tricuspid valve regurgitation is moderate . No evidence of tricuspid stenosis. Aortic Valve: The aortic valve is normal in structure. Aortic valve regurgitation is mild. No aortic stenosis is present. Pulmonic Valve: The pulmonic valve was normal in structure. Pulmonic valve regurgitation is not visualized. No evidence of pulmonic stenosis. Aorta:  The aortic root is normal in size and structure. Venous: The inferior vena cava is normal in size with greater than 50% respiratory variability, suggesting right atrial pressure of 3 mmHg. IAS/Shunts: No atrial level shunt detected by color flow Doppler.  LEFT VENTRICLE PLAX 2D LVIDd:         5.20 cm  Diastology LVIDs:         4.40 cm  LV e' medial:    4.64 cm/s LV PW:         1.10 cm  LV E/e' medial:  16.5 LV IVS:        1.30 cm  LV e' lateral:   5.34 cm/s LVOT diam:     2.10 cm  LV E/e' lateral: 14.3 LV SV:         58 LV SV Index:   29 LVOT Area:     3.46 cm  RIGHT VENTRICLE RV S prime:     18.30 cm/s TAPSE (M-mode): 1.9 cm LEFT ATRIUM           Index       RIGHT ATRIUM           Index LA diam:      3.20 cm 1.62 cm/m  RA Area:     17.90 cm LA Vol (A4C): 78.9 ml 39.97 ml/m RA Volume:   47.10 ml  23.86 ml/m  AORTIC VALVE LVOT Vmax:   101.00 cm/s LVOT Vmean:  59.100 cm/s LVOT VTI:    0.168 m  AORTA Ao Root diam: 3.10 cm MITRAL VALVE                TRICUSPID VALVE MV Area (PHT): 3.08 cm     TR Peak grad:   39.4 mmHg MV Decel Time: 246 msec     TR Vmax:        314.00 cm/s MV E velocity: 76.40 cm/s MV A velocity: 135.00  cm/s  SHUNTS MV E/A ratio:  0.57         Systemic VTI:  0.17 m                             Systemic Diam: 2.10 cm Ena Dawley MD Electronically signed by Ena Dawley MD Signature Date/Time: 01/26/2020/12:57:47 PM    Final        The results of significant diagnostics from this hospitalization (including imaging, microbiology, ancillary and laboratory) are listed below for reference.     Microbiology: Recent Results (from the past 240 hour(s))  Urine C&S     Status: Abnormal   Collection Time: 01/25/20  4:19 PM   Specimen: Urine, Random  Result Value Ref Range Status   Specimen Description   Final    URINE, RANDOM Performed at [facility] [address], [place], [place] [address]    Special Requests   Final    NONE Performed at [facility], [place] [address], [place], [place] [address]    Culture >=100,000 COLONIES/mL ESCHERICHIA COLI (A)  Final   Report Status 01/28/2020 FINAL  Final   Organism ID, Bacteria ESCHERICHIA COLI (A)  Final      Susceptibility   Escherichia coli - MIC*    AMPICILLIN <=2 SENSITIVE Sensitive     CEFAZOLIN <=4 SENSITIVE Sensitive     CEFTRIAXONE <=0.25 SENSITIVE Sensitive     CIPROFLOXACIN <=0.25 SENSITIVE Sensitive     GENTAMICIN <=1 SENSITIVE Sensitive     IMIPENEM 0.5 SENSITIVE Sensitive     NITROFURANTOIN <=16 SENSITIVE Sensitive     TRIMETH/SULFA <=20 SENSITIVE Sensitive     AMPICILLIN/SULBACTAM <=2 SENSITIVE Sensitive     PIP/TAZO <=4 SENSITIVE Sensitive     * >=100,000 COLONIES/mL ESCHERICHIA COLI  Respiratory Panel by RT PCR (Flu A&B, Covid) - Nasopharyngeal Swab     Status: None   Collection Time: 01/25/20  8:22 PM   Specimen: Nasopharyngeal Swab  Result Value Ref Range Status   SARS Coronavirus 2 by RT PCR NEGATIVE NEGATIVE Final    Comment: (NOTE) SARS-CoV-2 target nucleic acids are NOT DETECTED.  The SARS-CoV-2 RNA is generally detectable in upper respiratoy specimens during the acute phase of infection. The lowest concentration of SARS-CoV-2 viral copies this assay can detect is 131 copies/mL. A negative result does not preclude SARS-Cov-2 infection and should not be used as the sole basis for treatment or other patient management decisions. A negative result may occur with  improper specimen collection/handling, submission of specimen other than nasopharyngeal swab, presence of viral mutation(s) within the areas targeted by this assay, and inadequate number of viral copies (<131 copies/mL). A negative result must be combined with clinical observations, patient history, and epidemiological information. The expected result is Negative.  Fact Sheet for Patients:  PinkCheek.be  Fact Sheet for Healthcare Providers:    GravelBags.it  This test is no t yet approved or cleared by the [place] FDA and  has been authorized for detection and/or diagnosis of SARS-CoV-2 by FDA under an Emergency Use Authorization (EUA). This EUA will remain  in effect (meaning this test can be used) for the duration of the COVID-19 declaration under Section 564(b)(1) of the Act, 21 U.S.C. section 360bbb-3(b)(1), unless the authorization is terminated or revoked sooner.     Influenza A by PCR NEGATIVE NEGATIVE Final   Influenza B by PCR NEGATIVE NEGATIVE Final    Comment: (NOTE)  The Xpert Xpress SARS-CoV-2/FLU/RSV assay is intended as an aid in  the diagnosis of influenza from Nasopharyngeal swab specimens and  should not be used as a sole basis for treatment. Nasal washings and  aspirates are unacceptable for Xpert Xpress SARS-CoV-2/FLU/RSV  testing.  Fact Sheet for Patients: PinkCheek.be  Fact Sheet for Healthcare Providers: GravelBags.it  This test is not yet approved or cleared by the [place] FDA and  has been authorized for detection and/or diagnosis of SARS-CoV-2 by  FDA under an Emergency Use Authorization (EUA). This EUA will remain  in effect (meaning this test can be used) for the duration of the  Covid-19 declaration under Section 564(b)(1) of the Act, 21  U.S.C. section 360bbb-3(b)(1), unless the authorization is  terminated or revoked. Performed at [facility], [place] [address], [place], [place] [address]      Labs: BNP (last 3 results) Recent Labs    01/25/20 1633  BNP 470.9*   Basic Metabolic Panel: Recent Labs  Lab 01/26/20 0452 01/26/20 0452 01/27/20 0454 01/28/20 0344 01/29/20 0545 01/30/20 0507 01/31/20 0407  NA 140   < > 138 140 138 139 139  K 4.0   < > 3.9 3.9 3.7 4.0 3.8  CL 104   < > 103 102 100 99 100  CO2 27   < > _0 GLUCOSE 99   < > 129* 125*  102* 123* 107*  BUN 29*   < > 32* 35* 33* 39* 41*  CREATININE 2.01*   < > 2.22* 2.18* 2.14* 2.05* 2.00*  CALCIUM 7.9*   < > 7.0* 6.8* 6.4* 6.7* 6.9*  MG 2.4  --   --   --   --  1.9 2.1  PHOS  --   --   --   --   --  1.6* 2.0*   < > = values in this interval not displayed.   Liver Function Tests: Recent Labs  Lab 01/25/20 1812 01/29/20 0545 01/30/20 0507 01/31/20 0407  AST 23  --   --   --   ALT 13  --   --   --   ALKPHOS 188*  --   --   --   BILITOT 0.9  --   --   --   PROT 6.1*  --   --   --   ALBUMIN 3.3* 2.7* 2.8* 2.8*   No results for input(s): LIPASE, AMYLASE in the last 168 hours. No results for input(s): AMMONIA in the last 168 hours. CBC: Recent Labs  Lab 01/25/20 1633 01/28/20 0942 01/30/20 0507 01/31/20 0407  WBC 5.7 5.4  --   --   HGB 9.6* 9.2* 9.0* 8.8*  HCT 31.3* 29.3* 28.8* 28.8*  MCV 106.8* 105.8*  --   --   PLT 152 154  --   --    Cardiac Enzymes: No results for input(s): CKTOTAL, CKMB, CKMBINDEX, TROPONINI in the last 168 hours. BNP: Invalid input(s): POCBNP CBG: No results for input(s): GLUCAP in the last 168 hours. D-Dimer No results for input(s): DDIMER in the last 72 hours. Hgb A1c No results for input(s): HGBA1C in the last 72 hours. Lipid Profile Recent Labs    01/29/20 0545  CHOL 210*  HDL 51  LDLCALC 129*  TRIG 149  CHOLHDL 4.1   Thyroid function studies No results for input(s): TSH, T4TOTAL, T3FREE, THYROIDAB in the last 72 hours.  Invalid input(s): FREET3 Anemia work up No results for  input(s): VITAMINB12, FOLATE, FERRITIN, TIBC, IRON, RETICCTPCT in the last 72 hours. Urinalysis    Component Value Date/Time   COLORURINE YELLOW 01/25/2020 1619   APPEARANCEUR HAZY (A) 01/25/2020 1619   LABSPEC 1.018 01/25/2020 1619   PHURINE 5.0 01/25/2020 1619   GLUCOSEU NEGATIVE 01/25/2020 1619   HGBUR SMALL (A) 01/25/2020 1619   BILIRUBINUR NEGATIVE 01/25/2020 1619   KETONESUR NEGATIVE 01/25/2020 1619   PROTEINUR NEGATIVE 01/25/2020  1619   UROBILINOGEN 1.0 05/06/2012 2022   NITRITE NEGATIVE 01/25/2020 1619   LEUKOCYTESUR MODERATE (A) 01/25/2020 1619   Sepsis Labs Invalid input(s): PROCALCITONIN,  WBC,  LACTICIDVEN   Time coordinating discharge: 40 minutes  SIGNED:  Mercy Riding, MD  [facility] 01/31/2020, 4:13 PM  If 7PM-7AM, please contact night-coverage [email or web address]

## 2020-02-02 ENCOUNTER — Telehealth: Payer: Self-pay

## 2020-02-02 NOTE — Telephone Encounter (Cosign Needed)
Transition Care Management Follow-up Telephone Call  Date of discharge and from where: [facility] 01/31/20  How have you been since you were released from the hospital? good  Any questions or concerns? No  Items Reviewed:  Did the pt receive and understand the discharge instructions provided? Yes   Medications obtained and verified? Yes   Any new allergies since your discharge? No   Dietary orders reviewed? Yes  Do you have support at home? Yes   Functional Questionnaire: (I = Independent and D = Dependent) ADLs: D  Bathing/Dressing- D  Meal Prep- D  Eating- D  Maintaining continence- D  Transferring/Ambulation- D  Managing Meds- D  Follow up appointments reviewed:   PCP Hospital f/u appt confirmed? Daughter wants to get appt information with Cardiologist and then will call for  [facility] follow up appt    [facility] f/u appt confirmed? Daughter will call to schedule appt doesn't want just a lab appt   Are transportation arrangements needed? No   If their condition worsens, is the pt aware to call PCP or go to the Emergency Dept.? Yes  Was the patient provided with contact information for the PCP's office or ED? Yes  Was to pt encouraged to call back with questions or concerns? Yes

## 2020-02-03 ENCOUNTER — Telehealth: Payer: Self-pay | Admitting: Interventional Cardiology

## 2020-02-03 NOTE — Telephone Encounter (Signed)
Left message to call office

## 2020-02-03 NOTE — Telephone Encounter (Signed)
   Pt's daughter calling, she said the pt have labs on 10/11 and 10/12. She asked if pt can get the labs on 10/12 and Dr. [name] will just send the result to Dr. Irish Lack

## 2020-02-03 NOTE — Telephone Encounter (Signed)
Spoke with daughter and advised I will cancel labs scheduled in our on 10/11 and send a message to Dr. Irish Lack and his nurse.  Pt having labs at [facility] on 10/12 and they typically draw a CMP.  Daughter aware that if they do not draw the labs we need, we will be in contact to get a new lab appt here.  Daughter appreciative for call.

## 2020-02-04 DIAGNOSIS — I2510 Atherosclerotic heart disease of native coronary artery without angina pectoris: Secondary | ICD-10-CM | POA: Diagnosis not present

## 2020-02-04 DIAGNOSIS — N1832 Chronic kidney disease, stage 3b: Secondary | ICD-10-CM | POA: Diagnosis not present

## 2020-02-04 DIAGNOSIS — I5041 Acute combined systolic (congestive) and diastolic (congestive) heart failure: Secondary | ICD-10-CM | POA: Diagnosis not present

## 2020-02-04 DIAGNOSIS — M6281 Muscle weakness (generalized): Secondary | ICD-10-CM | POA: Diagnosis not present

## 2020-02-04 DIAGNOSIS — I130 Hypertensive heart and chronic kidney disease with heart failure and stage 1 through stage 4 chronic kidney disease, or unspecified chronic kidney disease: Secondary | ICD-10-CM | POA: Diagnosis not present

## 2020-02-04 DIAGNOSIS — C7951 Secondary malignant neoplasm of bone: Secondary | ICD-10-CM | POA: Diagnosis not present

## 2020-02-04 NOTE — Telephone Encounter (Signed)
Noted  

## 2020-02-06 ENCOUNTER — Telehealth: Payer: Self-pay

## 2020-02-06 DIAGNOSIS — I130 Hypertensive heart and chronic kidney disease with heart failure and stage 1 through stage 4 chronic kidney disease, or unspecified chronic kidney disease: Secondary | ICD-10-CM | POA: Diagnosis not present

## 2020-02-06 DIAGNOSIS — I5041 Acute combined systolic (congestive) and diastolic (congestive) heart failure: Secondary | ICD-10-CM | POA: Diagnosis not present

## 2020-02-06 DIAGNOSIS — M6281 Muscle weakness (generalized): Secondary | ICD-10-CM | POA: Diagnosis not present

## 2020-02-06 DIAGNOSIS — N1832 Chronic kidney disease, stage 3b: Secondary | ICD-10-CM | POA: Diagnosis not present

## 2020-02-06 DIAGNOSIS — C7951 Secondary malignant neoplasm of bone: Secondary | ICD-10-CM | POA: Diagnosis not present

## 2020-02-06 DIAGNOSIS — I2510 Atherosclerotic heart disease of native coronary artery without angina pectoris: Secondary | ICD-10-CM | POA: Diagnosis not present

## 2020-02-06 NOTE — Telephone Encounter (Signed)
Home Health Verbal Orders  Agency:  [facility]   Requesting OT/ PT/ Skilled nursing/ Social Work/ Speech: Occupational Therapy    Frequency:  2 week 3 / 1 week 1 starting on 02/09/20

## 2020-02-06 NOTE — Telephone Encounter (Signed)
Returned Rob's call and provided verbal orders.

## 2020-02-09 ENCOUNTER — Other Ambulatory Visit: Payer: Self-pay | Admitting: *Deleted

## 2020-02-09 ENCOUNTER — Other Ambulatory Visit: Payer: Medicare Other

## 2020-02-09 DIAGNOSIS — I5041 Acute combined systolic (congestive) and diastolic (congestive) heart failure: Secondary | ICD-10-CM | POA: Diagnosis not present

## 2020-02-09 DIAGNOSIS — I2510 Atherosclerotic heart disease of native coronary artery without angina pectoris: Secondary | ICD-10-CM | POA: Diagnosis not present

## 2020-02-09 DIAGNOSIS — I130 Hypertensive heart and chronic kidney disease with heart failure and stage 1 through stage 4 chronic kidney disease, or unspecified chronic kidney disease: Secondary | ICD-10-CM | POA: Diagnosis not present

## 2020-02-09 DIAGNOSIS — C61 Malignant neoplasm of prostate: Secondary | ICD-10-CM

## 2020-02-09 DIAGNOSIS — C7951 Secondary malignant neoplasm of bone: Secondary | ICD-10-CM | POA: Diagnosis not present

## 2020-02-09 DIAGNOSIS — N1832 Chronic kidney disease, stage 3b: Secondary | ICD-10-CM | POA: Diagnosis not present

## 2020-02-09 DIAGNOSIS — M6281 Muscle weakness (generalized): Secondary | ICD-10-CM | POA: Diagnosis not present

## 2020-02-09 NOTE — Progress Notes (Signed)
HEMATOLOGY/ONCOLOGY CLINIC NOTE  Date of Service: 02/10/20    Patient Care Team: [name], MD as PCP - General (Family Medicine) [name], MD as PCP - Cardiology (Cardiology)  CHIEF COMPLAINTS/PURPOSE OF CONSULTATION:  -metastatic prostate cancer -Myelopthisic anemia  HISTORY OF PRESENTING ILLNESS:   Tony [name] is a wonderful 86 y.o. male who has been referred to [place] by Dr Dimas Chyle for evaluation and management of anemia. He is accompanied today by his daughter. The pt reports that he is doing well overall.   The pt reports a new onset of fatigue that began in January 2019. His daughter notes being able to tell a difference in his energy levels as early as November 2018. He notes that some cramping pain in his thighs. He notes that his recent 07/23/17 blood transfusion has led to a "terrific, immediate change" in his thigh pain. However, his thigh pain has returned in the last couple days.  He notes that he takes 1036mcg Vitamin B12 daily.   He notes that for 6-7 years he took iron pills after his 2012 heart attack and stroke. He notes that he took a statin for 6-7 months and was taken off of it recently. He notes no unresolved symptoms from his stroke.   The pt notes that over the last 4-5 months his appetite has diminished and he has subsequently lost about 20 lbs in that time.  He notes that he has historically not preferred to drink water as such, but hydrates with juice, coffee, and other drinks.  He notes that he believes that he is able to take care of himself adequately at home. He also notes that his cane is sufficient for helping him get around.   Most recent lab results (07/22/17) of CBC  is as follows: all values are WNL except for RBC at 2.94, Hgb at 8.7, HCT at 27.2, RDW at 26.5, Platelets at 134k. CBC from 10/30/16 revealed all values WNL.  CMP 07/21/17 revealed all values WNL except for Sodium at 134, Glucose at 101, Creatinine at 1.85, Calcium  at 7.9, Total Protein at 5.7, Albumin at 2.7, Alk Phos at 433.  LDH 07/20/17 elevated at 256. Haptoglobin 07/20/17 elevated at 357.  Vitamin B12 07/06/17 is WNL at 229.   On review of systems, pt reports decreased appetite, losing 20 lbs over 4-5 months, bilateral thigh pain, fatigue, mild leg swelling, and denies fevers, chills, night sweats, bone pains, nausea, abdominal pains, bleeding, blood in the urine, blood in the stools, black stools, changes in bowel habits, light headednss, dizziness, abdominal pains, noticing any new lumps or bumps, testicular pain or swelling, and any other symptoms.   On PMHx the pt reports heart attack and stroke in 2012. On Social Hx the pt denies much ETOH consumption.   Interval History: Tony [name] returns today regarding his metastatic prostate cancer and myelophthisic anemia. We are joined by his daughter, [name]. The patient's last visit with [place] was on 12/16/2019. The pt reports that he is doing well overall.  The pt reports that he has felt well since his latest hospital discharge. Pt is currently taking 5 mg Prednisone daily and has remained off of Abiraterone since discharge. His leg swelling has nearly resolved. Pt is receiving at-home PT twice per week. This is improving his walk. He continues using the Fentanyl Patch and has not needed much short-acting pain relief.   Lab results today (02/10/20) of CBC w/diff and CMP is as  follows: all values are WNL except for RBC at 2.64, Hgb at 8.5, HCT at 27.7, MCV at 104.9, nRBC at 1.1, Abs Immature Granulocytes at 0.16K, Glucose at 115, BUN at 38, Creatinine at 2.50, Albumin at 2.8, ALP at 168, GFR Est at 22. 02/10/2020 PSA at 486.0   Component     Latest Ref Rng & Units 06/03/2019 07/01/2019 07/29/2019 01/13/2020  Prostate Specific Ag, Serum     0.0 - 4.0 ng/mL 117.0 (H) 162.0 (H) 181.0 (H) 371.0 (H)   On review of systems, pt reports chronic knee pain, improved leg swelling and denies new bone pain, new back  pain, fatigue, headaches, abdominal pain and any other symptoms.   MEDICAL HISTORY:  Past Medical History:  Diagnosis Date  . BPH associated with nocturia    flomax 0.4--> 0.8 mg trial. nocturia if has coffee. some incontinence  . CAD (coronary artery disease)    LAD Stent 2012. 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   . Gout    no rx. apparently 1x in past  . History of stroke    no aspirin before stroke. no deficits. slurred words at time of stroke  . Hyperlipidemia   . Hypertension     SURGICAL HISTORY: Past Surgical History:  Procedure Laterality Date  . CARDIAC SURGERY     Stint  . CORONARY STENT PLACEMENT    . left arm fracture s/p surgery    . right leg fracture s.p surgery- screws like arm      SOCIAL HISTORY: Social History   Socioeconomic History  . Marital status: Married    Spouse name: Not on file  . Number of children: Not on file  . Years of education: Not on file  . Highest education level: Not on file  Occupational History  . Not on file  Tobacco Use  . Smoking status: Former Smoker    Packs/day: 0.50    Years: 1.00    Pack years: 0.50    Types: Cigarettes    Quit date: 05/01/1952    Years since quitting: 67.8  . Smokeless tobacco: Never Used  Vaping Use  . Vaping Use: Never used  Substance and Sexual Activity  . Alcohol use: No  . Drug use: No  . Sexual activity: Not on file  Other Topics Concern  . Not on file  Social History Narrative   Married- lives separate from wife. 2 children. Daughter passed from heart attack.       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 Difficulty of Paying Living Expenses: Not on file  Food Insecurity:   . Worried About Charity fundraiser in the Last Year: Not on file  . Ran Out of Food in the Last Year: Not on file  Transportation Needs:   . Lack of Transportation (Medical): Not on  file  . Lack of Transportation (Non-Medical): Not on file  Physical Activity:   . Days of Exercise per Week: Not on file  . Minutes of Exercise per Session: Not on file  Stress:   . Feeling of Stress : Not on file  Social Connections:   . Frequency of Communication with Friends and Family: Not on file  . Frequency of Social Gatherings with Friends and Family: Not on file  . Attends Religious Services: Not on file  . Active Member of  Clubs or Organizations: Not on file  . Attends Archivist Meetings: Not on file  . Marital Status: Not on file  Intimate Partner Violence:   . Fear of Current or Ex-Partner: Not on file  . Emotionally Abused: Not on file  . Physically Abused: Not on file  . Sexually Abused: Not on file    FAMILY HISTORY: Family History  Problem Relation Age of Onset  . Breast cancer Mother   . Heart disease Father   . Heart disease Brother        x2  . Prostate cancer Brother     ALLERGIES:  is allergic to gabapentin.  MEDICATIONS:  Current Outpatient Medications  Medication Sig Dispense Refill  . aspirin EC 81 MG tablet Take 1 tablet (81 mg total) by mouth daily. 90 tablet 3  . BIDIL 20-37.5 MG tablet TAKE 1 TABLET BY MOUTH TWICE DAILY (Patient taking differently: Take 1 tablet by mouth in the morning and at bedtime. ) 180 tablet 3  . calcium-vitamin D (OSCAL 500/200 D-3) 500-200 MG-UNIT tablet Take 1 tablet by mouth daily with breakfast. 30 tablet 2  . carvedilol (COREG) 12.5 MG tablet Take 1 tablet (12.5 mg total) by mouth 2 (two) times daily with a meal. 90 tablet 1  . fentaNYL (DURAGESIC) 25 MCG/HR Place 1 patch onto the skin every 3 (three) days. 10 patch 0  . furosemide (LASIX) 40 MG tablet Take 1 tablet (40 mg total) by mouth daily. 90 tablet 0  . Multiple Vitamins-Minerals (MULTI COMPLETE PO) Take 1 tablet by mouth daily.     . nitroGLYCERIN (NITROSTAT) 0.4 MG SL tablet Place 1 tablet (0.4 mg total) under the tongue every 5 (five) minutes as  needed for chest pain (3 maximum before seeking care). (Patient not taking: Reported on 02/02/2020) 30 tablet 0  . Omega-3 1000 MG CAPS Take 1 capsule by mouth daily.     Marland Kitchen oxyCODONE (OXY IR/ROXICODONE) 5 MG immediate release tablet Take 1 tablet (5 mg total) by mouth every 4 (four) hours as needed for severe pain. (Patient not taking: Reported on 02/02/2020) 90 tablet 0  . polyethylene glycol (MIRALAX) packet Take 17 g by mouth daily. 30 each 1  . predniSONE (DELTASONE) 5 MG tablet Take 1 tablet (5 mg total) by mouth daily with breakfast. 30 tablet 8  . senna-docusate (SENNA S) 8.6-50 MG tablet Take 2 tablets by mouth 2 (two) times daily. 120 tablet 2  . tamsulosin (FLOMAX) 0.4 MG CAPS capsule TAKE 1 CAPSULE BY MOUTH TWICE DAILY (Patient taking differently: Take 0.4 mg by mouth in the morning and at bedtime. ) 180 capsule 3  . vitamin B-12 (CYANOCOBALAMIN) 1000 MCG tablet Take 1 tablet (1,000 mcg total) by mouth daily. 30 tablet 0  . Vitamin D, Ergocalciferol, (DRISDOL) 1.25 MG (50000 UNIT) CAPS capsule Take 1 capsule Monday, Wednesday and Friday. 36 capsule 3   No current facility-administered medications for this visit.    REVIEW OF SYSTEMS:   A 10+ POINT REVIEW OF SYSTEMS WAS OBTAINED including neurology, dermatology, psychiatry, cardiac, respiratory, lymph, extremities, GI, GU, Musculoskeletal, constitutional, breasts, reproductive, HEENT.  All pertinent positives are noted in the HPI.  All others are negative.   PHYSICAL EXAMINATION: ECOG FS:2 - Symptomatic, <50% confined to bed  Vitals:   02/10/20 1045  BP: 137/76  Pulse: 89  Resp: 18  Temp: (!) 96.5 F (35.8 C)  SpO2: 98%   Wt Readings from Last 3 Encounters:  02/10/20 176 lb 9.6  oz (80.1 kg)  01/31/20 177 lb 4 oz (80.4 kg)  12/16/19 189 lb 11.2 oz (86 kg)   GENERAL:alert, in no acute distress and comfortable SKIN: no acute rashes, no significant lesions EYES: conjunctiva are pink and non-injected, sclera  anicteric OROPHARYNX: MMM, no exudates, no oropharyngeal erythema or ulceration NECK: supple, no JVD LYMPH:  no palpable lymphadenopathy in the cervical, axillary or inguinal regions LUNGS: clear to auscultation b/l with normal respiratory effort HEART: regular rate & rhythm ABDOMEN:  normoactive bowel sounds , non tender, not distended. No palpable hepatosplenomegaly.  Extremity: no pedal edema PSYCH: alert & oriented x 3 with fluent speech NEURO: no focal motor/sensory deficits  LABORATORY DATA:  I have reviewed the data as listed  . CBC Latest Ref Rng & Units 02/10/2020 01/31/2020 01/30/2020  WBC 4.0 - 10.5 K/uL 5.7 - -  Hemoglobin 13.0 - 17.0 g/dL 8.5(L) 8.8(L) 9.0(L)  Hematocrit 39 - 52 % 27.7(L) 28.8(L) 28.8(L)  Platelets 150 - 400 K/uL 256 - -   . CBC    Component Value Date/Time   WBC 5.7 02/10/2020 1025   WBC 5.4 01/28/2020 0942   RBC 2.64 (L) 02/10/2020 1025   HGB 8.5 (L) 02/10/2020 1025   HCT 27.7 (L) 02/10/2020 1025   HCT 21.9 (L) 07/20/2017 2103   PLT 256 02/10/2020 1025   MCV 104.9 (H) 02/10/2020 1025   MCH 32.2 02/10/2020 1025   MCHC 30.7 02/10/2020 1025   RDW 15.0 02/10/2020 1025   LYMPHSABS 1.3 02/10/2020 1025   MONOABS 0.5 02/10/2020 1025   EOSABS 0.0 02/10/2020 1025   BASOSABS 0.0 02/10/2020 1025     . CMP Latest Ref Rng & Units 01/31/2020 01/30/2020 01/29/2020  Glucose 70 - 99 mg/dL 107(H) 123(H) 102(H)  BUN 8 - 23 mg/dL 41(H) 39(H) 33(H)  Creatinine 0.61 - 1.24 mg/dL 2.00(H) 2.05(H) 2.14(H)  Sodium 135 - 145 mmol/L 139 139 138  Potassium 3.5 - 5.1 mmol/L 3.8 4.0 3.7  Chloride 98 - 111 mmol/L 100 99 100  CO2 22 - 32 mmol/L $RemoveB'29 29 29  'OiIzvbBE$ Calcium 8.9 - 10.3 mg/dL 6.9(L) 6.7(L) 6.4(LL)  Total Protein 6.5 - 8.1 g/dL - - -  Total Bilirubin 0.3 - 1.2 mg/dL - - -  Alkaline Phos 38 - 126 U/L - - -  AST 15 - 41 U/L - - -  ALT 0 - 44 U/L - - -    Component     Latest Ref Rng & Units 07/30/2017 09/06/2017 10/04/2017 10/31/2017  Prostate Specific Ag, Serum      0.0 - 4.0 ng/mL 1,186.0 (H) 433.8 (H) 67.4 (H) 55.8 (H)   07/31/17 BM Bx:   RADIOGRAPHIC STUDIES: I have personally reviewed the radiological images as listed and agreed with the findings in the report. DG Chest 2 View  Result Date: 01/25/2020 CLINICAL DATA:  Patient with bilateral feet and leg swelling. EXAM: CHEST - 2 VIEW COMPARISON:  Chest radiograph January 25, 2020 FINDINGS: Monitoring leads overlie the patient. Stable cardiomegaly. No large area pulmonary consolidation. No pleural effusion or pneumothorax. Patchy sclerotic osseous changes throughout the axial and appendicular skeleton compatible with history of metastatic prostate cancer. IMPRESSION: Cardiomegaly. No acute cardiopulmonary process. Electronically Signed   By: Lovey Newcomer M.D.   On: 01/25/2020 18:49   ECHOCARDIOGRAM COMPLETE  Result Date: 01/26/2020    ECHOCARDIOGRAM REPORT   Patient Name:   CALVERT CHARLAND [facility] Date of Exam: 01/26/2020 Medical Rec #:  [id number]      Height:  67.0 in Accession #:    [id number]     Weight:       189.0 lb Date of Birth:  12/14/1930      BSA:          1.974 m Patient Age:    42 years       BP:           147/81 mmHg Patient Gender: M              HR:           72 bpm. Exam Location:  Inpatient Procedure: 2D Echo and Intracardiac Opacification Agent Indications:    CHF- Acute Systolic P59.16  History:        Patient has prior history of Echocardiogram examinations, most                 recent 03/02/2011. CAD; Risk Factors:Hypertension and                 Dyslipidemia.  Sonographer:    Mikki Santee RDCS (AE) Referring Phys: [id number] [place]  1. Left ventricular ejection fraction, by estimation, is 35 to 40%. The left ventricle has moderately decreased function. The left ventricle demonstrates global hypokinesis. There is moderate concentric left ventricular hypertrophy. Left ventricular diastolic parameters are consistent with Grade I diastolic dysfunction (impaired relaxation).   2. Right ventricular systolic function is mildly reduced. The right ventricular size is mildly enlarged. There is mildly elevated pulmonary artery systolic pressure. The estimated right ventricular systolic pressure is 93.5 mmHg.  3. The mitral valve is normal in structure. Mild mitral valve regurgitation. No evidence of mitral stenosis.  4. Tricuspid valve regurgitation is moderate.  5. The aortic valve is normal in structure. Aortic valve regurgitation is mild. No aortic stenosis is present.  6. The inferior vena cava is normal in size with greater than 50% respiratory variability, suggesting right atrial pressure of 3 mmHg. FINDINGS  Left Ventricle: Left ventricular ejection fraction, by estimation, is 35 to 40%. The left ventricle has moderately decreased function. The left ventricle demonstrates global hypokinesis. Definity contrast agent was given IV to delineate the left ventricular endocardial borders. The left ventricular internal cavity size was normal in size. There is moderate concentric left ventricular hypertrophy. Abnormal (paradoxical) septal motion, consistent with left bundle branch block. Left ventricular diastolic parameters are consistent with Grade I diastolic dysfunction (impaired relaxation). Normal left ventricular filling pressure. Right Ventricle: The right ventricular size is mildly enlarged. No increase in right ventricular wall thickness. Right ventricular systolic function is mildly reduced. There is mildly elevated pulmonary artery systolic pressure. The tricuspid regurgitant  velocity is 3.14 m/s, and with an assumed right atrial pressure of 3 mmHg, the estimated right ventricular systolic pressure is 70.1 mmHg. Left Atrium: Left atrial size was normal in size. Right Atrium: Right atrial size was normal in size. Pericardium: There is no evidence of pericardial effusion. Mitral Valve: The mitral valve is normal in structure. There is mild thickening of the mitral valve leaflet(s).  Mild mitral annular calcification. Mild mitral valve regurgitation. No evidence of mitral valve stenosis. Tricuspid Valve: The tricuspid valve is normal in structure. Tricuspid valve regurgitation is moderate . No evidence of tricuspid stenosis. Aortic Valve: The aortic valve is normal in structure. Aortic valve regurgitation is mild. No aortic stenosis is present. Pulmonic Valve: The pulmonic valve was normal in structure. Pulmonic valve regurgitation is not visualized. No evidence of pulmonic stenosis. Aorta: The aortic root  is normal in size and structure. Venous: The inferior vena cava is normal in size with greater than 50% respiratory variability, suggesting right atrial pressure of 3 mmHg. IAS/Shunts: No atrial level shunt detected by color flow Doppler.  LEFT VENTRICLE PLAX 2D LVIDd:         5.20 cm  Diastology LVIDs:         4.40 cm  LV e' medial:    4.64 cm/s LV PW:         1.10 cm  LV E/e' medial:  16.5 LV IVS:        1.30 cm  LV e' lateral:   5.34 cm/s LVOT diam:     2.10 cm  LV E/e' lateral: 14.3 LV SV:         58 LV SV Index:   29 LVOT Area:     3.46 cm  RIGHT VENTRICLE RV S prime:     18.30 cm/s TAPSE (M-mode): 1.9 cm LEFT ATRIUM           Index       RIGHT ATRIUM           Index LA diam:      3.20 cm 1.62 cm/m  RA Area:     17.90 cm LA Vol (A4C): 78.9 ml 39.97 ml/m RA Volume:   47.10 ml  23.86 ml/m  AORTIC VALVE LVOT Vmax:   101.00 cm/s LVOT Vmean:  59.100 cm/s LVOT VTI:    0.168 m  AORTA Ao Root diam: 3.10 cm MITRAL VALVE                TRICUSPID VALVE MV Area (PHT): 3.08 cm     TR Peak grad:   39.4 mmHg MV Decel Time: 246 msec     TR Vmax:        314.00 cm/s MV E velocity: 76.40 cm/s MV A velocity: 135.00 cm/s  SHUNTS MV E/A ratio:  0.57         Systemic VTI:  0.17 m                             Systemic Diam: 2.10 cm Ena Dawley MD Electronically signed by Ena Dawley MD Signature Date/Time: 01/26/2020/12:57:47 PM    Final     ASSESSMENT & PLAN:   86 y.o. male with  1.  Normocytic Anemia - due to primarily metastatic prostatic cancer (ACD + BM involvement with prostate cancer causing myelopthisic picture)  -Pt presented with a Hgb at 8.7 on 07/22/17, improved to 10.3 after receiving PRBC transfusion. -In review of the patient's previous CBC records, his anemia developed 6-7 months before presenting to care with me, unaccompanied by a drop in his EPO (07/20/17 elevated at 249.8);  LDH elevated  but haptoglobin at 357 on 07/20/17. -suggested against active hemolysis.  2. Recently diagnosed metastatic prostate cancer with extensive bone metastases. With bone mets/BM Mets and LNadenopathy. Exam positive for left retroperitoneal, bilateral pelvis and right inguinal adenopathy. Exam positive for left retroperitoneal, bilateral pelvis and right inguinal adenopathy. Diffuse sclerotic bone metastasis.   3. Elevated alkaline phosphatase due to bone metastases from prostate cancer -CT BM bx -concerning for significant bone lesions in lower spine and pelvis PSA levels have declined to 26.9  4. Cancer related pain - primarily in b/l thighs. -much improved.  PLAN:  -Discussed pt labwork today, 02/10/20; increased anemia, elevated nRBC, kidney numbers are elevated, PSA continues to climb. -  Advised pt that we would not re-start Abiraterone as it is causing fluid retention and it does not appear to be holding disease. -Advised pt that he has been through several lines of hormonal therapy, which suggests that further hormonal therapy would not be very beneficial.  -As pt is not currently having much symptomatic disease progression and we could watch at this time, but this may cause worsening anemia. -Discussed using Sipuleucel-t (though logistic likely untenable) vs androgen deprivation therapy vs watchful observation vs consideration of chemotherapy vs consideration of other targeted therapies (immunotherapy or BRCA targeting therapies if appropriate) -Advised pt that we can  re-send tumor markers to see if there are other therapeutic options - this is my recommendation. -Discussed CDC guidelines regarding the [place] booster. Will give in clinic after PET scan. -Continue Xgeva q4weeks and Lupron q12weeks  -Continue taking Vitamin D supplement  -Refill Fentanyl Patch -Will get axumin PET/CT in 1 week -Will see back in 2 weeks via phone   FOLLOW UP: PET/CT in 1 week Phone visit with Dr [name] in 2 weeks   The total time spent in the appt was 40 minutes and more than 50% was on counseling and direct patient cares.  All of the patient's questions were answered with apparent satisfaction. The patient knows to call the clinic with any problems, questions or concerns.    Sullivan Lone MD [place] AAHIVMS [facility] Hematology/Oncology Physician [facility]  (Office):       [phone] (Work cell):  [phone] (Fax):           [phone]  02/10/2020 11:06 AM   I, Yevette Edwards, am acting as a scribe for Dr. Sullivan Lone.   .I have reviewed the above documentation for accuracy and completeness, and I agree with the above. Brunetta Genera MD

## 2020-02-10 ENCOUNTER — Other Ambulatory Visit: Payer: Medicare Other

## 2020-02-10 ENCOUNTER — Ambulatory Visit: Payer: Medicare Other

## 2020-02-10 ENCOUNTER — Inpatient Hospital Stay: Payer: Medicare Other | Attending: Hematology | Admitting: Hematology

## 2020-02-10 ENCOUNTER — Inpatient Hospital Stay: Payer: Medicare Other

## 2020-02-10 ENCOUNTER — Other Ambulatory Visit: Payer: Self-pay

## 2020-02-10 VITALS — BP 137/76 | HR 89 | Temp 96.5°F | Resp 18 | Ht 67.0 in | Wt 176.6 lb

## 2020-02-10 DIAGNOSIS — G893 Neoplasm related pain (acute) (chronic): Secondary | ICD-10-CM | POA: Diagnosis not present

## 2020-02-10 DIAGNOSIS — C61 Malignant neoplasm of prostate: Secondary | ICD-10-CM

## 2020-02-10 DIAGNOSIS — D6182 Myelophthisis: Secondary | ICD-10-CM

## 2020-02-10 DIAGNOSIS — Z79899 Other long term (current) drug therapy: Secondary | ICD-10-CM | POA: Diagnosis not present

## 2020-02-10 DIAGNOSIS — R748 Abnormal levels of other serum enzymes: Secondary | ICD-10-CM | POA: Insufficient documentation

## 2020-02-10 DIAGNOSIS — C7951 Secondary malignant neoplasm of bone: Secondary | ICD-10-CM | POA: Insufficient documentation

## 2020-02-10 DIAGNOSIS — D649 Anemia, unspecified: Secondary | ICD-10-CM | POA: Insufficient documentation

## 2020-02-10 LAB — CBC WITH DIFFERENTIAL (CANCER CENTER ONLY)
Abs Immature Granulocytes: 0.16 10*3/uL — ABNORMAL HIGH (ref 0.00–0.07)
Basophils Absolute: 0 10*3/uL (ref 0.0–0.1)
Basophils Relative: 0 %
Eosinophils Absolute: 0 10*3/uL (ref 0.0–0.5)
Eosinophils Relative: 0 %
HCT: 27.7 % — ABNORMAL LOW (ref 39.0–52.0)
Hemoglobin: 8.5 g/dL — ABNORMAL LOW (ref 13.0–17.0)
Immature Granulocytes: 3 %
Lymphocytes Relative: 22 %
Lymphs Abs: 1.3 10*3/uL (ref 0.7–4.0)
MCH: 32.2 pg (ref 26.0–34.0)
MCHC: 30.7 g/dL (ref 30.0–36.0)
MCV: 104.9 fL — ABNORMAL HIGH (ref 80.0–100.0)
Monocytes Absolute: 0.5 10*3/uL (ref 0.1–1.0)
Monocytes Relative: 10 %
Neutro Abs: 3.7 10*3/uL (ref 1.7–7.7)
Neutrophils Relative %: 65 %
Platelet Count: 256 10*3/uL (ref 150–400)
RBC: 2.64 MIL/uL — ABNORMAL LOW (ref 4.22–5.81)
RDW: 15 % (ref 11.5–15.5)
WBC Count: 5.7 10*3/uL (ref 4.0–10.5)
nRBC: 1.1 % — ABNORMAL HIGH (ref 0.0–0.2)

## 2020-02-10 LAB — CMP (CANCER CENTER ONLY)
ALT: 13 U/L (ref 0–44)
AST: 15 U/L (ref 15–41)
Albumin: 2.8 g/dL — ABNORMAL LOW (ref 3.5–5.0)
Alkaline Phosphatase: 168 U/L — ABNORMAL HIGH (ref 38–126)
Anion gap: 7 (ref 5–15)
BUN: 38 mg/dL — ABNORMAL HIGH (ref 8–23)
CO2: 31 mmol/L (ref 22–32)
Calcium: 9 mg/dL (ref 8.9–10.3)
Chloride: 103 mmol/L (ref 98–111)
Creatinine: 2.5 mg/dL — ABNORMAL HIGH (ref 0.61–1.24)
GFR, Estimated: 22 mL/min — ABNORMAL LOW (ref >60)
Glucose, Bld: 115 mg/dL — ABNORMAL HIGH (ref 70–99)
Potassium: 4.5 mmol/L (ref 3.5–5.1)
Sodium: 141 mmol/L (ref 135–145)
Total Bilirubin: 0.4 mg/dL (ref 0.3–1.2)
Total Protein: 6.6 g/dL (ref 6.5–8.1)

## 2020-02-10 MED ORDER — DENOSUMAB 120 MG/1.7ML ~~LOC~~ SOLN
120.0000 mg | Freq: Once | SUBCUTANEOUS | Status: AC
  Administered 2020-02-10: 120 mg via SUBCUTANEOUS

## 2020-02-10 MED ORDER — DENOSUMAB 120 MG/1.7ML ~~LOC~~ SOLN
SUBCUTANEOUS | Status: AC
  Filled 2020-02-10: qty 1.7

## 2020-02-10 MED ORDER — FENTANYL 25 MCG/HR TD PT72: 1.0000 | MEDICATED_PATCH | TRANSDERMAL | 0 refills | Status: DC

## 2020-02-10 NOTE — Patient Instructions (Signed)
Denosumab injection °What is this medicine? °DENOSUMAB (den oh sue mab) slows bone breakdown. Prolia is used to treat osteoporosis in women after menopause and in men, and in people who are taking corticosteroids for 6 months or more. Xgeva is used to treat a high calcium level due to cancer and to prevent bone fractures and other bone problems caused by multiple myeloma or cancer bone metastases. Xgeva is also used to treat giant cell tumor of the bone. °This medicine may be used for other purposes; ask your health care provider or pharmacist if you have questions. °COMMON BRAND NAME(S): Prolia, XGEVA °What should I tell my health care provider before I take this medicine? °They need to know if you have any of these conditions: °· dental disease °· having surgery or tooth extraction °· infection °· kidney disease °· low levels of calcium or Vitamin D in the blood °· malnutrition °· on hemodialysis °· skin conditions or sensitivity °· thyroid or parathyroid disease °· an unusual reaction to denosumab, other medicines, foods, dyes, or preservatives °· pregnant or trying to get pregnant °· breast-feeding °How should I use this medicine? °This medicine is for injection under the skin. It is given by a health care professional in a hospital or clinic setting. °A special MedGuide will be given to you before each treatment. Be sure to read this information carefully each time. °For Prolia, talk to your pediatrician regarding the use of this medicine in children. Special care may be needed. For Xgeva, talk to your pediatrician regarding the use of this medicine in children. While this drug may be prescribed for children as young as 13 years for selected conditions, precautions do apply. °Overdosage: If you think you have taken too much of this medicine contact a poison control center or emergency room at once. °NOTE: This medicine is only for you. Do not share this medicine with others. °What if I miss a dose? °It is  important not to miss your dose. Call your doctor or health care professional if you are unable to keep an appointment. °What may interact with this medicine? °Do not take this medicine with any of the following medications: °· other medicines containing denosumab °This medicine may also interact with the following medications: °· medicines that lower your chance of fighting infection °· steroid medicines like prednisone or cortisone °This list may not describe all possible interactions. Give your health care provider a list of all the medicines, herbs, non-prescription drugs, or dietary supplements you use. Also tell them if you smoke, drink alcohol, or use illegal drugs. Some items may interact with your medicine. °What should I watch for while using this medicine? °Visit your doctor or health care professional for regular checks on your progress. Your doctor or health care professional may order blood tests and other tests to see how you are doing. °Call your doctor or health care professional for advice if you get a fever, chills or sore throat, or other symptoms of a cold or flu. Do not treat yourself. This drug may decrease your body's ability to fight infection. Try to avoid being around people who are sick. °You should make sure you get enough calcium and vitamin D while you are taking this medicine, unless your doctor tells you not to. Discuss the foods you eat and the vitamins you take with your health care professional. °See your dentist regularly. Brush and floss your teeth as directed. Before you have any dental work done, tell your dentist you are   receiving this medicine. Do not become pregnant while taking this medicine or for 5 months after stopping it. Talk with your doctor or health care professional about your birth control options while taking this medicine. Women should inform their doctor if they wish to become pregnant or think they might be pregnant. There is a potential for serious side  effects to an unborn child. Talk to your health care professional or pharmacist for more information. What side effects may I notice from receiving this medicine? Side effects that you should report to your doctor or health care professional as soon as possible:  allergic reactions like skin rash, itching or hives, swelling of the face, lips, or tongue  bone pain  breathing problems  dizziness  jaw pain, especially after dental work  redness, blistering, peeling of the skin  signs and symptoms of infection like fever or chills; cough; sore throat; pain or trouble passing urine  signs of low calcium like fast heartbeat, muscle cramps or muscle pain; pain, tingling, numbness in the hands or feet; seizures  unusual bleeding or bruising  unusually weak or tired Side effects that usually do not require medical attention (report to your doctor or health care professional if they continue or are bothersome):  constipation  diarrhea  headache  joint pain  loss of appetite  muscle pain  runny nose  tiredness  upset stomach This list may not describe all possible side effects. Call your doctor for medical advice about side effects. You may report side effects to FDA at 1-800-FDA-1088. Where should I keep my medicine? This medicine is only given in a clinic, doctor's office, or other health care setting and will not be stored at home. NOTE: This sheet is a summary. It may not cover all possible information. If you have questions about this medicine, talk to your doctor, pharmacist, or health care provider.  2020 Elsevier/Gold Standard (2017-08-24 16:10:44)

## 2020-02-11 DIAGNOSIS — I130 Hypertensive heart and chronic kidney disease with heart failure and stage 1 through stage 4 chronic kidney disease, or unspecified chronic kidney disease: Secondary | ICD-10-CM | POA: Diagnosis not present

## 2020-02-11 DIAGNOSIS — I5041 Acute combined systolic (congestive) and diastolic (congestive) heart failure: Secondary | ICD-10-CM | POA: Diagnosis not present

## 2020-02-11 DIAGNOSIS — M6281 Muscle weakness (generalized): Secondary | ICD-10-CM | POA: Diagnosis not present

## 2020-02-11 DIAGNOSIS — I2510 Atherosclerotic heart disease of native coronary artery without angina pectoris: Secondary | ICD-10-CM | POA: Diagnosis not present

## 2020-02-11 DIAGNOSIS — C7951 Secondary malignant neoplasm of bone: Secondary | ICD-10-CM | POA: Diagnosis not present

## 2020-02-11 DIAGNOSIS — N1832 Chronic kidney disease, stage 3b: Secondary | ICD-10-CM | POA: Diagnosis not present

## 2020-02-11 LAB — PSA, TOTAL AND FREE
PSA, Free Pct: >10.3 %
PSA, Free: >50 ng/mL
Prostate Specific Ag, Serum: 486 ng/mL — ABNORMAL HIGH (ref 0.0–4.0)

## 2020-02-11 NOTE — Progress Notes (Signed)
Cardiology Office Note   Date:  02/20/2020   ID:  Tony [name] [birth year], MRN [id number]  PCP:  [name], MD  Cardiologist:  Dr. Irish Lack, MD    Chief Complaint  Patient presents with  . Hospitalization Follow-up    History of Present Illness: Tony [name] is a 86 y.o. male who presents for hospital follow up, seen for Dr. Irish Lack.   Tony [name] has a hx of CAD s/p prior stenting to LAD in 2012 with occluded RCA with collaterals also noted at that time, prior stroke in 2012, hypertension, hyperlipidemia with intolerance to statins, CKD stage IV, BPH, gout, chronic anemia, and stage IV prostate cancer with metastasis to bone   He had a NSTEMI in 12/2010 that was treated at [facility] in [place]. At that time, he was found to be in complete heart block secondary to ACS. Cardiac cath showed 100% RCA occlusion with left to right collaterals and 80% LAD stenosis. He underwent successful PTCA and stenting of LAD lesion. RCA lesion was treated medically. He also reportedly had a stroke and kidney failure (requiring 1 session of dialysis) at same time of MI. He was first seen by Dr. Irish Lack in 2018 to get established with a Cardiologist in [place].   He was most recently seen by cardiology in hospital consultation 01/27/20 for the evaluation of SOB and LE edema. Echo at that time showed new LV dysfunction with an EF 35-40% with global HK and mildly reduced RVF with moderate PHTN. He was given IV lasix with excellent response. He was noted to be on chronic steroids for his cancer which was likely causing worsening edema.   Per chart review, he was adamant above not wanting invasive workup of his new LV dysfunction and opted  for conservative measures. No stress test, coronary CT or LHC was performed. Plan was to treat medically with ASA, beta blocker, carvedilol and Bidil   Today, he presents with his daughter in a wheelchair and reports that he is doing well since  hospital discharge.  He does state he has some fatigue however he had poor sleep during his hospitalization and feels that he is catching up.  He has been working with home health PT/OT and feels better when he is up and active.  Daughter seems very attuned to weighing daily and following his medications closely.  She has lots of questions regarding if he has a weight gain of 3 pounds or greater in 1 day with or without a weight gain.  We have decided to add as needed Lasix 20 mg if weight gain in 1 day.  She is to call if weight not responsive to additional Lasix.  We also discussed utilizing our on-call answering service with any off our questions if needed.  He denies chest pain, shortness of breath, orthopnea, PND, dizziness or syncope.  Did have one brief episode earlier today of a "weird feeling" and cannot pinpoint the exact etiology.  He does state he had gone from the sitting to standing position so likely could be orthostatic hypotension.  BPs and heart rates have been stable at home and here in the office.  Past Medical History:  Diagnosis Date  . BPH associated with nocturia    flomax 0.4--> 0.8 mg trial. nocturia if has coffee. some incontinence  . CAD (coronary artery disease)    LAD Stent 2012. 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   Marland Kitchen  Gout    no rx. apparently 1x in past  . History of stroke    no aspirin before stroke. no deficits. slurred words at time of stroke  . Hyperlipidemia   . Hypertension     Past Surgical History:  Procedure Laterality Date  . CARDIAC SURGERY     Stint  . CORONARY STENT PLACEMENT    . left arm fracture s/p surgery    . right leg fracture s.p surgery- screws like arm       Current Outpatient Medications  Medication Sig Dispense Refill  . aspirin EC 81 MG tablet Take 1 tablet (81 mg total) by mouth daily. 90 tablet 3  . BIDIL 20-37.5 MG tablet TAKE 1 TABLET BY MOUTH TWICE DAILY 180 tablet 3  . calcium-vitamin D (OSCAL  500/200 D-3) 500-200 MG-UNIT tablet Take 1 tablet by mouth daily with breakfast. 30 tablet 2  . carvedilol (COREG) 12.5 MG tablet Take 1 tablet (12.5 mg total) by mouth 2 (two) times daily with a meal. 90 tablet 1  . fentaNYL (DURAGESIC) 25 MCG/HR Place 1 patch onto the skin every 3 (three) days. 10 patch 0  . furosemide (LASIX) 40 MG tablet Take 1 tablet (40 mg total) by mouth daily. Make take one-half additional tablet 20 mg by mouth as needed for swelling or weight gain of 3 pounds or more in a day or 5 pounds or more in a week. 135 tablet 3  . Multiple Vitamins-Minerals (MULTI COMPLETE PO) Take 1 tablet by mouth daily.     . nitroGLYCERIN (NITROSTAT) 0.4 MG SL tablet Place 1 tablet (0.4 mg total) under the tongue every 5 (five) minutes as needed for chest pain (3 maximum before seeking care). 30 tablet 0  . Omega-3 1000 MG CAPS Take 1 capsule by mouth daily.     Marland Kitchen oxyCODONE (OXY IR/ROXICODONE) 5 MG immediate release tablet Take 1 tablet (5 mg total) by mouth every 4 (four) hours as needed for severe pain. 90 tablet 0  . polyethylene glycol (MIRALAX) packet Take 17 g by mouth daily. 30 each 1  . predniSONE (DELTASONE) 5 MG tablet Take 1 tablet (5 mg total) by mouth daily with breakfast. 30 tablet 8  . senna-docusate (SENNA S) 8.6-50 MG tablet Take 2 tablets by mouth 2 (two) times daily. (Patient taking differently: Take 2 tablets by mouth as needed. ) 120 tablet 2  . tamsulosin (FLOMAX) 0.4 MG CAPS capsule TAKE 1 CAPSULE BY MOUTH TWICE DAILY 180 capsule 3  . vitamin B-12 (CYANOCOBALAMIN) 1000 MCG tablet Take 1 tablet (1,000 mcg total) by mouth daily. 30 tablet 0  . Vitamin D, Ergocalciferol, (DRISDOL) 1.25 MG (50000 UNIT) CAPS capsule Take 1 capsule Monday, Wednesday and Friday. 36 capsule 3   No current facility-administered medications for this visit.    Allergies:   Gabapentin    Social History:  The patient  reports that he quit smoking about 67 years ago. His smoking use included  cigarettes. He has a 0.50 pack-year smoking history. He has never used smokeless tobacco. He reports that he does not drink alcohol and does not use drugs.   Family History:  The patient's family history includes Breast cancer in his mother; Heart disease in his brother and father; Prostate cancer in his brother.   ROS:  Please see the history of present illness. Otherwise, review of systems are positive for none.   All other systems are reviewed and negative.    PHYSICAL EXAM: VS:  BP  132/70   Pulse 85   Ht 5\' 7"  (1.702 m)   Wt 174 lb (78.9 kg)   SpO2 96%   BMI 27.25 kg/m  , BMI Body mass index is 27.25 kg/m.   General: Elderly, NAD Neck: Negative for carotid bruits. No JVD Lungs:Clear to ausculation bilaterally. No wheezes, rales, or rhonchi. Breathing is unlabored. Cardiovascular: RRR with S1 S2. No murmurs Extremities: No edema>> compression stockings in place. Radial pulses 2+ bilaterally Neuro: Alert and oriented. No focal deficits. No facial asymmetry. MAE spontaneously. Psych: Responds to questions appropriately with normal affect.    EKG:  EKG is not ordered today.  Recent Labs: 01/25/2020: B Natriuretic Peptide 662.0 01/31/2020: Magnesium 2.1 02/10/2020: ALT 13; BUN 38; Creatinine 2.50; Potassium 4.5; Sodium 141 02/20/2020: Hemoglobin 8.6; Platelet Count 203   Lipid Panel    Component Value Date/Time   CHOL 210 (H) 01/29/2020 0545   TRIG 149 01/29/2020 0545   HDL 51 01/29/2020 0545   CHOLHDL 4.1 01/29/2020 0545   VLDL 30 01/29/2020 0545   LDLCALC 129 (H) 01/29/2020 0545   LDLDIRECT 141.0 10/30/2016 1528     Wt Readings from Last 3 Encounters:  02/20/20 174 lb (78.9 kg)  02/10/20 176 lb 9.6 oz (80.1 kg)  01/31/20 177 lb 4 oz (80.4 kg)    Other studies Reviewed: Additional studies/ records that were reviewed today include:  Review of the above records demonstrates:  Echo 01/26/20 1. Left ventricular ejection fraction, by estimation, is 35 to 40%. The  left  ventricle has moderately decreased function. The left ventricle  demonstrates global hypokinesis. There is moderate concentric left  ventricular hypertrophy. Left ventricular  diastolic parameters are consistent with Grade I diastolic dysfunction  (impaired relaxation).  2. Right ventricular systolic function is mildly reduced. The right  ventricular size is mildly enlarged. There is mildly elevated pulmonary  artery systolic pressure. The estimated right ventricular systolic  pressure is 56.2 mmHg.  3. The mitral valve is normal in structure. Mild mitral valve  regurgitation. No evidence of mitral stenosis.  4. Tricuspid valve regurgitation is moderate.  5. The aortic valve is normal in structure. Aortic valve regurgitation is  mild. No aortic stenosis is present.  6. The inferior vena cava is normal in size with greater than 50%  respiratory variability, suggesting right atrial pressure of 3 mmHg.   ASSESSMENT AND PLAN:  1.  New onset systolic CHF/presumed ischemic cardiomyopathy: -Echocardiogram during recent hospitalization with LVEF at 35 to 40%, down from 50% at time of prior MI with moderate LVH and global hypokinesis.  RV also mildly enlarged with mildly reduced systolic function and mildly elevated PSAP at 42 mmHg -Patient deferred invasive work-up including stress test, CCTA or LHC and opted for medical management -Currently managed with 40 mg Lasix daily however we added 20 mg as needed dosing for weight greater than 3 pounds in 1 day and 5 pounds in 1 week -Continue BiDil, carvedilol -Continue low salt, fluid restriction  2. CAD s/p stenting to LAD in 2012/Suspected demand ischemia: -Prior NSTEMI in 2012 with stenting to LAD also noted to have RCA with collaterals now with new LV dysfunction  -Patient deferred CV workup  -Continue medical management with ASA, beta-blocker  -Intolerant to statin therapy  -Denies anginal symptoms  3. HTN: -Stable, 132/70  today -Continue current regimen with no change   4. HLD: -Intolerant to statin therapy secondary to myalgias -Last LDL, 129  -May need referral to lipid clinic   5.  CKD stage IV: -Creatinine elevated around the 2.0 mark with a baseline at 2.8 -Has nephrology referral>> awaiting callback  Current medicines are reviewed at length with the patient today.  The patient does not have concerns regarding medicines.  The following changes have been made: Add as needed Lasix 20 mg for weight gain of 3 pounds in 1 day or 5 pounds in 1 week  Labs/ tests ordered today include: BMET   Orders Placed This Encounter  Procedures  . Basic metabolic panel    Disposition:   FU with myself in 4 weeks  Signed, Kathyrn Drown, NP  02/20/2020 4:52 PM    [facility] [address], [place], [place]  [address] Phone: [phone]; Fax: [phone]

## 2020-02-12 ENCOUNTER — Telehealth: Payer: Self-pay

## 2020-02-12 DIAGNOSIS — M6281 Muscle weakness (generalized): Secondary | ICD-10-CM | POA: Diagnosis not present

## 2020-02-12 DIAGNOSIS — I5041 Acute combined systolic (congestive) and diastolic (congestive) heart failure: Secondary | ICD-10-CM | POA: Diagnosis not present

## 2020-02-12 DIAGNOSIS — I2510 Atherosclerotic heart disease of native coronary artery without angina pectoris: Secondary | ICD-10-CM | POA: Diagnosis not present

## 2020-02-12 DIAGNOSIS — I130 Hypertensive heart and chronic kidney disease with heart failure and stage 1 through stage 4 chronic kidney disease, or unspecified chronic kidney disease: Secondary | ICD-10-CM | POA: Diagnosis not present

## 2020-02-12 DIAGNOSIS — C7951 Secondary malignant neoplasm of bone: Secondary | ICD-10-CM | POA: Diagnosis not present

## 2020-02-12 DIAGNOSIS — N1832 Chronic kidney disease, stage 3b: Secondary | ICD-10-CM | POA: Diagnosis not present

## 2020-02-12 NOTE — Telephone Encounter (Signed)
See below

## 2020-02-12 NOTE — Telephone Encounter (Signed)
Tony [name] is calling in to schedule her dad Tony [name] for a hospital follow up, needs to follow up in 1 week. Can we use a virtual slot on Thursday for a hospital f/u.

## 2020-02-13 DIAGNOSIS — N1832 Chronic kidney disease, stage 3b: Secondary | ICD-10-CM | POA: Diagnosis not present

## 2020-02-13 DIAGNOSIS — C7951 Secondary malignant neoplasm of bone: Secondary | ICD-10-CM | POA: Diagnosis not present

## 2020-02-13 DIAGNOSIS — I5041 Acute combined systolic (congestive) and diastolic (congestive) heart failure: Secondary | ICD-10-CM | POA: Diagnosis not present

## 2020-02-13 DIAGNOSIS — I130 Hypertensive heart and chronic kidney disease with heart failure and stage 1 through stage 4 chronic kidney disease, or unspecified chronic kidney disease: Secondary | ICD-10-CM | POA: Diagnosis not present

## 2020-02-13 DIAGNOSIS — I2510 Atherosclerotic heart disease of native coronary artery without angina pectoris: Secondary | ICD-10-CM | POA: Diagnosis not present

## 2020-02-13 DIAGNOSIS — M6281 Muscle weakness (generalized): Secondary | ICD-10-CM | POA: Diagnosis not present

## 2020-02-13 NOTE — Telephone Encounter (Signed)
That is fine. Please make sure it is a 40 minute slot.  [name]. [name], MD 02/13/2020 1:54 PM

## 2020-02-13 NOTE — Telephone Encounter (Signed)
See below

## 2020-02-16 ENCOUNTER — Telehealth: Payer: Self-pay | Admitting: Hematology

## 2020-02-16 ENCOUNTER — Other Ambulatory Visit: Payer: Self-pay | Admitting: *Deleted

## 2020-02-16 DIAGNOSIS — I2510 Atherosclerotic heart disease of native coronary artery without angina pectoris: Secondary | ICD-10-CM | POA: Diagnosis not present

## 2020-02-16 DIAGNOSIS — C7951 Secondary malignant neoplasm of bone: Secondary | ICD-10-CM

## 2020-02-16 DIAGNOSIS — M6281 Muscle weakness (generalized): Secondary | ICD-10-CM | POA: Diagnosis not present

## 2020-02-16 DIAGNOSIS — I5041 Acute combined systolic (congestive) and diastolic (congestive) heart failure: Secondary | ICD-10-CM | POA: Diagnosis not present

## 2020-02-16 DIAGNOSIS — C61 Malignant neoplasm of prostate: Secondary | ICD-10-CM

## 2020-02-16 DIAGNOSIS — I130 Hypertensive heart and chronic kidney disease with heart failure and stage 1 through stage 4 chronic kidney disease, or unspecified chronic kidney disease: Secondary | ICD-10-CM | POA: Diagnosis not present

## 2020-02-16 DIAGNOSIS — N1832 Chronic kidney disease, stage 3b: Secondary | ICD-10-CM | POA: Diagnosis not present

## 2020-02-16 NOTE — Telephone Encounter (Signed)
Scheduled per 10/12 los, spoke with patient's daughter. Informed her patient will need lab and CT scan to be scheduled before patient speaks with provider.

## 2020-02-16 NOTE — Telephone Encounter (Signed)
Patient is scheduled for Monday.

## 2020-02-17 DIAGNOSIS — N1832 Chronic kidney disease, stage 3b: Secondary | ICD-10-CM | POA: Diagnosis not present

## 2020-02-17 DIAGNOSIS — I130 Hypertensive heart and chronic kidney disease with heart failure and stage 1 through stage 4 chronic kidney disease, or unspecified chronic kidney disease: Secondary | ICD-10-CM | POA: Diagnosis not present

## 2020-02-17 DIAGNOSIS — C7951 Secondary malignant neoplasm of bone: Secondary | ICD-10-CM | POA: Diagnosis not present

## 2020-02-17 DIAGNOSIS — M6281 Muscle weakness (generalized): Secondary | ICD-10-CM | POA: Diagnosis not present

## 2020-02-17 DIAGNOSIS — I2510 Atherosclerotic heart disease of native coronary artery without angina pectoris: Secondary | ICD-10-CM | POA: Diagnosis not present

## 2020-02-17 DIAGNOSIS — I5041 Acute combined systolic (congestive) and diastolic (congestive) heart failure: Secondary | ICD-10-CM | POA: Diagnosis not present

## 2020-02-18 DIAGNOSIS — N1832 Chronic kidney disease, stage 3b: Secondary | ICD-10-CM | POA: Diagnosis not present

## 2020-02-18 DIAGNOSIS — C7951 Secondary malignant neoplasm of bone: Secondary | ICD-10-CM | POA: Diagnosis not present

## 2020-02-18 DIAGNOSIS — I130 Hypertensive heart and chronic kidney disease with heart failure and stage 1 through stage 4 chronic kidney disease, or unspecified chronic kidney disease: Secondary | ICD-10-CM | POA: Diagnosis not present

## 2020-02-18 DIAGNOSIS — M6281 Muscle weakness (generalized): Secondary | ICD-10-CM | POA: Diagnosis not present

## 2020-02-18 DIAGNOSIS — I5041 Acute combined systolic (congestive) and diastolic (congestive) heart failure: Secondary | ICD-10-CM | POA: Diagnosis not present

## 2020-02-18 DIAGNOSIS — I2510 Atherosclerotic heart disease of native coronary artery without angina pectoris: Secondary | ICD-10-CM | POA: Diagnosis not present

## 2020-02-19 DIAGNOSIS — I5041 Acute combined systolic (congestive) and diastolic (congestive) heart failure: Secondary | ICD-10-CM | POA: Diagnosis not present

## 2020-02-19 DIAGNOSIS — C7951 Secondary malignant neoplasm of bone: Secondary | ICD-10-CM | POA: Diagnosis not present

## 2020-02-19 DIAGNOSIS — N1832 Chronic kidney disease, stage 3b: Secondary | ICD-10-CM | POA: Diagnosis not present

## 2020-02-19 DIAGNOSIS — M6281 Muscle weakness (generalized): Secondary | ICD-10-CM | POA: Diagnosis not present

## 2020-02-19 DIAGNOSIS — I2510 Atherosclerotic heart disease of native coronary artery without angina pectoris: Secondary | ICD-10-CM | POA: Diagnosis not present

## 2020-02-19 DIAGNOSIS — I130 Hypertensive heart and chronic kidney disease with heart failure and stage 1 through stage 4 chronic kidney disease, or unspecified chronic kidney disease: Secondary | ICD-10-CM | POA: Diagnosis not present

## 2020-02-19 NOTE — Telephone Encounter (Signed)
Spoke with pt's daughter, [name], [place] who states she would like for Kathyrn Drown, NP to order a CBC at pt's appointment 02/20/2020 as pt's Hgb is trending downward and pt is being followed by cancer center.  Pt is to be scheduled to have another PET scan but has not yet been contacted.  Pt's last Hgb was completed on 02/10/2020 with result of 8.5.   Pt's daughter advised to discuss request with Kathyrn Drown, NP at pt's appointment tomorrow 02/20/2020.  Pt's daughter verbalizes understanding and agrees with current plan.

## 2020-02-20 ENCOUNTER — Telehealth: Payer: Self-pay | Admitting: *Deleted

## 2020-02-20 ENCOUNTER — Encounter: Payer: Self-pay | Admitting: Cardiology

## 2020-02-20 ENCOUNTER — Other Ambulatory Visit: Payer: Self-pay | Admitting: *Deleted

## 2020-02-20 ENCOUNTER — Ambulatory Visit (INDEPENDENT_AMBULATORY_CARE_PROVIDER_SITE_OTHER): Payer: Medicare Other | Admitting: Cardiology

## 2020-02-20 ENCOUNTER — Other Ambulatory Visit: Payer: Self-pay

## 2020-02-20 ENCOUNTER — Inpatient Hospital Stay: Payer: Medicare Other

## 2020-02-20 VITALS — BP 132/70 | HR 85 | Ht 67.0 in | Wt 174.0 lb

## 2020-02-20 DIAGNOSIS — I872 Venous insufficiency (chronic) (peripheral): Secondary | ICD-10-CM | POA: Diagnosis not present

## 2020-02-20 DIAGNOSIS — C61 Malignant neoplasm of prostate: Secondary | ICD-10-CM | POA: Diagnosis not present

## 2020-02-20 DIAGNOSIS — C7951 Secondary malignant neoplasm of bone: Secondary | ICD-10-CM

## 2020-02-20 DIAGNOSIS — I5031 Acute diastolic (congestive) heart failure: Secondary | ICD-10-CM | POA: Diagnosis not present

## 2020-02-20 DIAGNOSIS — I2510 Atherosclerotic heart disease of native coronary artery without angina pectoris: Secondary | ICD-10-CM | POA: Diagnosis not present

## 2020-02-20 DIAGNOSIS — D649 Anemia, unspecified: Secondary | ICD-10-CM | POA: Diagnosis not present

## 2020-02-20 DIAGNOSIS — I10 Essential (primary) hypertension: Secondary | ICD-10-CM | POA: Diagnosis not present

## 2020-02-20 DIAGNOSIS — D6182 Myelophthisis: Secondary | ICD-10-CM

## 2020-02-20 DIAGNOSIS — R600 Localized edema: Secondary | ICD-10-CM

## 2020-02-20 DIAGNOSIS — E785 Hyperlipidemia, unspecified: Secondary | ICD-10-CM

## 2020-02-20 DIAGNOSIS — R609 Edema, unspecified: Secondary | ICD-10-CM | POA: Diagnosis not present

## 2020-02-20 DIAGNOSIS — G893 Neoplasm related pain (acute) (chronic): Secondary | ICD-10-CM | POA: Diagnosis not present

## 2020-02-20 DIAGNOSIS — N184 Chronic kidney disease, stage 4 (severe): Secondary | ICD-10-CM

## 2020-02-20 DIAGNOSIS — I255 Ischemic cardiomyopathy: Secondary | ICD-10-CM

## 2020-02-20 DIAGNOSIS — R748 Abnormal levels of other serum enzymes: Secondary | ICD-10-CM | POA: Diagnosis not present

## 2020-02-20 DIAGNOSIS — Z79899 Other long term (current) drug therapy: Secondary | ICD-10-CM | POA: Diagnosis not present

## 2020-02-20 LAB — CBC WITH DIFFERENTIAL (CANCER CENTER ONLY)
Abs Immature Granulocytes: 0.19 10*3/uL — ABNORMAL HIGH (ref 0.00–0.07)
Basophils Absolute: 0 10*3/uL (ref 0.0–0.1)
Basophils Relative: 0 %
Eosinophils Absolute: 0 10*3/uL (ref 0.0–0.5)
Eosinophils Relative: 0 %
HCT: 28.4 % — ABNORMAL LOW (ref 39.0–52.0)
Hemoglobin: 8.6 g/dL — ABNORMAL LOW (ref 13.0–17.0)
Immature Granulocytes: 3 %
Lymphocytes Relative: 19 %
Lymphs Abs: 1.4 10*3/uL (ref 0.7–4.0)
MCH: 31.5 pg (ref 26.0–34.0)
MCHC: 30.3 g/dL (ref 30.0–36.0)
MCV: 104 fL — ABNORMAL HIGH (ref 80.0–100.0)
Monocytes Absolute: 0.6 10*3/uL (ref 0.1–1.0)
Monocytes Relative: 8 %
Neutro Abs: 5 10*3/uL (ref 1.7–7.7)
Neutrophils Relative %: 70 %
Platelet Count: 203 10*3/uL (ref 150–400)
RBC: 2.73 MIL/uL — ABNORMAL LOW (ref 4.22–5.81)
RDW: 15.7 % — ABNORMAL HIGH (ref 11.5–15.5)
WBC Count: 7.2 10*3/uL (ref 4.0–10.5)
nRBC: 1.8 % — ABNORMAL HIGH (ref 0.0–0.2)

## 2020-02-20 LAB — SAMPLE TO BLOOD BANK

## 2020-02-20 MED ORDER — FUROSEMIDE 40 MG PO TABS: 40.0000 mg | ORAL_TABLET | Freq: Every day | ORAL | 3 refills | Status: DC

## 2020-02-20 MED ORDER — FUROSEMIDE 40 MG PO TABS
40.0000 mg | ORAL_TABLET | Freq: Every day | ORAL | 3 refills | Status: DC
Start: 2020-02-20 — End: 2020-02-20

## 2020-02-20 NOTE — Patient Instructions (Addendum)
Medication Instructions:  You may take an extra one half tablet of the furosemide (lasix) 20 mg as needed for swelling or weight gain of 3 pounds or more in a day or 5 pounds or more in a week.  *If you need a refill on your cardiac medications before your next appointment, please call your pharmacy*   Lab Work: BMET today If you have labs (blood work) drawn today and your tests are completely normal, you will receive your results only by: Marland Kitchen MyChart Message (if you have MyChart) OR . A paper copy in the mail If you have any lab test that is abnormal or we need to change your treatment, we will call you to review the results.   Testing/Procedures: None   Follow-Up: At [facility], you and your health needs are our priority.  As part of our continuing mission to provide you with exceptional heart care, we have created designated Provider Care Teams.  These Care Teams include your primary Cardiologist (physician) and Advanced Practice Providers (APPs -  Physician Assistants and Nurse Practitioners) who all work together to provide you with the care you need, when you need it.   Your next appointment:   You are scheduled to see Tony [name] on Wednesday 03/31/20 at 2:45 PM.

## 2020-02-20 NOTE — Telephone Encounter (Signed)
Received call back from pt's daughter. She sates she will be able to bring her father here for labs by 1:30 or so. High priority scheduling message sent and lab orders placed. Will review results to see if he needs a transfusion or not.

## 2020-02-20 NOTE — Telephone Encounter (Signed)
Received lab results. HGB is 8.6.  Per Dr. [name], no need for transfusion. TCT patient's daughter, [name]. Informed her of the results and Dr. Grier Mitts recommendation of no transfusion this time. [name] states he is quite weak but ambulatory. Advised to continue on with Cardiology appt. As patient has numerous medical conditions, several of them could cause him to be become more weak, along with his advanced age.  [name] voiced understanding and will call back if she needs another appt for her father with Dr. [name] before the scheduled appt in December.

## 2020-02-20 NOTE — Telephone Encounter (Signed)
Received call from pt's daughter, [name].  She states that her father is feeling weaker recently, sleeping more and 'hands are shaky'. She is concerned that his HGB has dropped and that he may need a blood transfusion.  She states that he has an appt with his cardiologist today at 2:30pm. She is planning on having that office get a CBC on him. She is asking if we can give him a transfusion if his HGB is low. Advised that at that time of day we would not be able to give him a transfusion as he doesn't have a type and cross done here. Advised that by the time he came here after Cardiology appt , it would be too late in the day as the lab closes at3:30 pm on Fridays. Advised that there is room on the schedule for him to get blood tomorrow if he can get here before his cardiology appt today.  [name] will check with him to see if he can be ready to be here by 1:30 pm. That would give him time for labs and then go to Cardiology appt. [name] states she will call me back about the above appt today.

## 2020-02-21 LAB — BASIC METABOLIC PANEL
BUN/Creatinine Ratio: 12 (ref 10–24)
BUN: 33 mg/dL — ABNORMAL HIGH (ref 8–27)
CO2: 28 mmol/L (ref 20–29)
Calcium: 8.4 mg/dL — ABNORMAL LOW (ref 8.6–10.2)
Chloride: 99 mmol/L (ref 96–106)
Creatinine, Ser: 2.73 mg/dL — ABNORMAL HIGH (ref 0.76–1.27)
GFR calc Af Amer: 23 mL/min/{1.73_m2} — ABNORMAL LOW (ref >59)
GFR calc non Af Amer: 20 mL/min/{1.73_m2} — ABNORMAL LOW (ref >59)
Glucose: 118 mg/dL — ABNORMAL HIGH (ref 65–99)
Potassium: 4.3 mmol/L (ref 3.5–5.2)
Sodium: 142 mmol/L (ref 134–144)

## 2020-02-23 ENCOUNTER — Inpatient Hospital Stay: Payer: Medicare Other | Admitting: Family Medicine

## 2020-02-23 DIAGNOSIS — N1832 Chronic kidney disease, stage 3b: Secondary | ICD-10-CM | POA: Diagnosis not present

## 2020-02-23 DIAGNOSIS — I2510 Atherosclerotic heart disease of native coronary artery without angina pectoris: Secondary | ICD-10-CM | POA: Diagnosis not present

## 2020-02-23 DIAGNOSIS — I5041 Acute combined systolic (congestive) and diastolic (congestive) heart failure: Secondary | ICD-10-CM | POA: Diagnosis not present

## 2020-02-23 DIAGNOSIS — M6281 Muscle weakness (generalized): Secondary | ICD-10-CM | POA: Diagnosis not present

## 2020-02-23 DIAGNOSIS — C7951 Secondary malignant neoplasm of bone: Secondary | ICD-10-CM | POA: Diagnosis not present

## 2020-02-23 DIAGNOSIS — I130 Hypertensive heart and chronic kidney disease with heart failure and stage 1 through stage 4 chronic kidney disease, or unspecified chronic kidney disease: Secondary | ICD-10-CM | POA: Diagnosis not present

## 2020-02-24 ENCOUNTER — Telehealth: Payer: Self-pay | Admitting: *Deleted

## 2020-02-24 DIAGNOSIS — C7951 Secondary malignant neoplasm of bone: Secondary | ICD-10-CM | POA: Diagnosis not present

## 2020-02-24 DIAGNOSIS — I130 Hypertensive heart and chronic kidney disease with heart failure and stage 1 through stage 4 chronic kidney disease, or unspecified chronic kidney disease: Secondary | ICD-10-CM | POA: Diagnosis not present

## 2020-02-24 DIAGNOSIS — I5041 Acute combined systolic (congestive) and diastolic (congestive) heart failure: Secondary | ICD-10-CM | POA: Diagnosis not present

## 2020-02-24 DIAGNOSIS — N1832 Chronic kidney disease, stage 3b: Secondary | ICD-10-CM | POA: Diagnosis not present

## 2020-02-24 DIAGNOSIS — I2510 Atherosclerotic heart disease of native coronary artery without angina pectoris: Secondary | ICD-10-CM | POA: Diagnosis not present

## 2020-02-24 DIAGNOSIS — M6281 Muscle weakness (generalized): Secondary | ICD-10-CM | POA: Diagnosis not present

## 2020-02-24 NOTE — Telephone Encounter (Signed)
Daughter called. Requested phone appt now on 10/27 be rescheduled for after PET is completed on 11/4. Appt on 10/27 cancelled.  Schedule message sent to r/s for following week.

## 2020-02-25 ENCOUNTER — Telehealth: Payer: Self-pay | Admitting: Hematology

## 2020-02-25 ENCOUNTER — Inpatient Hospital Stay: Payer: Medicare Other | Admitting: Hematology

## 2020-02-25 NOTE — Telephone Encounter (Signed)
R/s appt per 10/26 sch msg - left message for daughter with new appt time due to adding MD appt.

## 2020-02-26 ENCOUNTER — Encounter: Payer: Self-pay | Admitting: Family Medicine

## 2020-02-26 ENCOUNTER — Ambulatory Visit (INDEPENDENT_AMBULATORY_CARE_PROVIDER_SITE_OTHER): Payer: Medicare Other | Admitting: Family Medicine

## 2020-02-26 ENCOUNTER — Other Ambulatory Visit: Payer: Self-pay

## 2020-02-26 VITALS — BP 120/68 | HR 83 | Temp 97.6°F | Ht 67.0 in | Wt 176.0 lb

## 2020-02-26 DIAGNOSIS — N1832 Chronic kidney disease, stage 3b: Secondary | ICD-10-CM

## 2020-02-26 DIAGNOSIS — I5020 Unspecified systolic (congestive) heart failure: Secondary | ICD-10-CM

## 2020-02-26 DIAGNOSIS — I130 Hypertensive heart and chronic kidney disease with heart failure and stage 1 through stage 4 chronic kidney disease, or unspecified chronic kidney disease: Secondary | ICD-10-CM | POA: Diagnosis not present

## 2020-02-26 DIAGNOSIS — I2510 Atherosclerotic heart disease of native coronary artery without angina pectoris: Secondary | ICD-10-CM | POA: Diagnosis not present

## 2020-02-26 DIAGNOSIS — M6281 Muscle weakness (generalized): Secondary | ICD-10-CM | POA: Diagnosis not present

## 2020-02-26 DIAGNOSIS — I5041 Acute combined systolic (congestive) and diastolic (congestive) heart failure: Secondary | ICD-10-CM | POA: Diagnosis not present

## 2020-02-26 DIAGNOSIS — C61 Malignant neoplasm of prostate: Secondary | ICD-10-CM | POA: Diagnosis not present

## 2020-02-26 DIAGNOSIS — C7951 Secondary malignant neoplasm of bone: Secondary | ICD-10-CM

## 2020-02-26 DIAGNOSIS — M1710 Unilateral primary osteoarthritis, unspecified knee: Secondary | ICD-10-CM

## 2020-02-26 DIAGNOSIS — M179 Osteoarthritis of knee, unspecified: Secondary | ICD-10-CM

## 2020-02-26 NOTE — Assessment & Plan Note (Signed)
Worsened recently.  He will continue working with physical therapy.  May consider referral to orthopedics if not improving.

## 2020-02-26 NOTE — Assessment & Plan Note (Signed)
No signs of volume overload.  He is doing well with BiDil, Coreg, and Lasix.  Will be following up with cardiology in a few weeks.

## 2020-02-26 NOTE — Progress Notes (Signed)
   Tony [name] is a 86 y.o. male who presents today for an office visit.  Assessment/Plan:  Chronic Problems Addressed Today: HFrEF (heart failure with reduced ejection fraction) (HCC) No signs of volume overload.  He is doing well with BiDil, Coreg, and Lasix.  Will be following up with cardiology in a few weeks.  Prostate cancer metastatic to bone [facility]) Managed by oncology.  Now only on prednisone 5 mg daily after stopping Zytiga.  Has upcoming PET scan.  Chronic kidney disease, stage 3b Will place referral to nephrology.  Osteoarthritis of knee Worsened recently.  He will continue working with physical therapy.  May consider referral to orthopedics if not improving.     Subjective:  HPI:  Patient here today for hospital follow-up.  Was admitted on 01/25/2020.  He presented with acute on chronic lower extremity edema.  Was found to be in acute heart failure.  Was admitted for IV diuresis.  Echocardiogram showed EF of 35 to 40% and grade 1 diastolic dysfunction.  Cardiology consulted.  He refused any ischemic work-up.  He was aggressively diuresed and then transition to oral Lasix prior to discharge.  Oncology consulted while he was admitted.  His Zytiga was stopped due to lower extremity edema.  He was discharged on 10/2//2021.  Discharged home on oral Lasix, Coreg, and BiDil.  He has followed up with cardiology since being discharged.  He has also been working with home health PT/OT.  He has been doing well over the last few weeks.  Weight has been stable.  No worsening lower extremity edema.  No worsening shortness of breath.  He has been trying to cut down on his salt.  We following up with oncology next week.       Objective:  Physical Exam: BP 120/68   Pulse 83   Temp 97.6 F (36.4 C) (Temporal)   Ht 5\' 7"  (1.702 m)   Wt 176 lb (79.8 kg)   SpO2 96%   BMI 27.57 kg/m   Wt Readings from Last 3 Encounters:  02/26/20 176 lb (79.8 kg)  02/20/20 174 lb (78.9 kg)    02/10/20 176 lb 9.6 oz (80.1 kg)  Gen: No acute distress, resting comfortably in wheelchair CV: Regular rate and rhythm with no murmurs appreciated Pulm: Normal work of breathing, clear to auscultation bilaterally with no crackles, wheezes, or rhonchi Neuro: Grossly normal, moves all extremities.  Trace pretibial edema. Psych: Normal affect and thought content  Time Spent: 55 minutes of total time was spent on the date of the encounter performing the following actions: chart review prior to seeing the patient including recent hospitalization and cardiology visit, obtaining history, performing a medically necessary exam, counseling on the treatment plan, placing orders, and documenting in our EHR.        [name]. [name], MD 02/26/2020 4:41 PM

## 2020-02-26 NOTE — Patient Instructions (Signed)
It was very nice to see you today!  I am glad you are feeling better.  I will place a referral for you to see the kidney doctor.  Please let us know if you do not by next week.  No other changes today.  I will see you back in 6 months.  Please come back to see me sooner if needed.  Take care, Dr [name]

## 2020-02-26 NOTE — Assessment & Plan Note (Signed)
Managed by oncology.  Now only on prednisone 5 mg daily after stopping Zytiga.  Has upcoming PET scan.

## 2020-02-26 NOTE — Assessment & Plan Note (Signed)
Will place referral to nephrology.

## 2020-02-27 DIAGNOSIS — I2510 Atherosclerotic heart disease of native coronary artery without angina pectoris: Secondary | ICD-10-CM | POA: Diagnosis not present

## 2020-02-27 DIAGNOSIS — C7951 Secondary malignant neoplasm of bone: Secondary | ICD-10-CM | POA: Diagnosis not present

## 2020-02-27 DIAGNOSIS — M6281 Muscle weakness (generalized): Secondary | ICD-10-CM | POA: Diagnosis not present

## 2020-02-27 DIAGNOSIS — N1832 Chronic kidney disease, stage 3b: Secondary | ICD-10-CM | POA: Diagnosis not present

## 2020-02-27 DIAGNOSIS — I130 Hypertensive heart and chronic kidney disease with heart failure and stage 1 through stage 4 chronic kidney disease, or unspecified chronic kidney disease: Secondary | ICD-10-CM | POA: Diagnosis not present

## 2020-02-27 DIAGNOSIS — I5041 Acute combined systolic (congestive) and diastolic (congestive) heart failure: Secondary | ICD-10-CM | POA: Diagnosis not present

## 2020-03-01 ENCOUNTER — Other Ambulatory Visit: Payer: Self-pay | Admitting: *Deleted

## 2020-03-01 DIAGNOSIS — I130 Hypertensive heart and chronic kidney disease with heart failure and stage 1 through stage 4 chronic kidney disease, or unspecified chronic kidney disease: Secondary | ICD-10-CM | POA: Diagnosis not present

## 2020-03-01 DIAGNOSIS — I2510 Atherosclerotic heart disease of native coronary artery without angina pectoris: Secondary | ICD-10-CM | POA: Diagnosis not present

## 2020-03-01 DIAGNOSIS — C61 Malignant neoplasm of prostate: Secondary | ICD-10-CM

## 2020-03-01 DIAGNOSIS — M6281 Muscle weakness (generalized): Secondary | ICD-10-CM | POA: Diagnosis not present

## 2020-03-01 DIAGNOSIS — C7951 Secondary malignant neoplasm of bone: Secondary | ICD-10-CM

## 2020-03-01 DIAGNOSIS — N1832 Chronic kidney disease, stage 3b: Secondary | ICD-10-CM | POA: Diagnosis not present

## 2020-03-01 DIAGNOSIS — I5041 Acute combined systolic (congestive) and diastolic (congestive) heart failure: Secondary | ICD-10-CM | POA: Diagnosis not present

## 2020-03-02 ENCOUNTER — Telehealth: Payer: Self-pay | Admitting: *Deleted

## 2020-03-02 ENCOUNTER — Telehealth: Payer: Self-pay

## 2020-03-02 NOTE — Telephone Encounter (Signed)
[name],LPN requesting VO for tomorrow urine collection. Patient confused, urine dark  [facility] will [facility]  VO given

## 2020-03-02 NOTE — Telephone Encounter (Signed)
Error

## 2020-03-03 DIAGNOSIS — C7951 Secondary malignant neoplasm of bone: Secondary | ICD-10-CM | POA: Diagnosis not present

## 2020-03-03 DIAGNOSIS — N1832 Chronic kidney disease, stage 3b: Secondary | ICD-10-CM | POA: Diagnosis not present

## 2020-03-03 DIAGNOSIS — N390 Urinary tract infection, site not specified: Secondary | ICD-10-CM | POA: Diagnosis not present

## 2020-03-03 DIAGNOSIS — I5041 Acute combined systolic (congestive) and diastolic (congestive) heart failure: Secondary | ICD-10-CM | POA: Diagnosis not present

## 2020-03-03 DIAGNOSIS — I130 Hypertensive heart and chronic kidney disease with heart failure and stage 1 through stage 4 chronic kidney disease, or unspecified chronic kidney disease: Secondary | ICD-10-CM | POA: Diagnosis not present

## 2020-03-03 DIAGNOSIS — M6281 Muscle weakness (generalized): Secondary | ICD-10-CM | POA: Diagnosis not present

## 2020-03-03 DIAGNOSIS — I2510 Atherosclerotic heart disease of native coronary artery without angina pectoris: Secondary | ICD-10-CM | POA: Diagnosis not present

## 2020-03-03 NOTE — Telephone Encounter (Signed)
Ok with me. Please place any necessary orders. 

## 2020-03-04 ENCOUNTER — Encounter: Payer: Self-pay | Admitting: Hematology

## 2020-03-04 ENCOUNTER — Other Ambulatory Visit: Payer: Self-pay

## 2020-03-04 ENCOUNTER — Ambulatory Visit (HOSPITAL_COMMUNITY)
Admission: RE | Admit: 2020-03-04 | Discharge: 2020-03-04 | Disposition: A | Discharge status: Home / Self Care | Payer: Medicare Other | Source: Ambulatory Visit | Attending: Hematology | Admitting: Hematology

## 2020-03-04 ENCOUNTER — Encounter (HOSPITAL_COMMUNITY): Payer: Self-pay | Admitting: Emergency Medicine

## 2020-03-04 ENCOUNTER — Telehealth: Payer: Self-pay | Admitting: *Deleted

## 2020-03-04 ENCOUNTER — Emergency Department (HOSPITAL_COMMUNITY): Payer: Medicare Other

## 2020-03-04 ENCOUNTER — Inpatient Hospital Stay (HOSPITAL_COMMUNITY)
Admission: EM | Admit: 2020-03-04 | Discharge: 2020-03-12 | DRG: 682 | Disposition: A | Discharge status: Home Health | Payer: Medicare Other | Attending: Internal Medicine | Admitting: Internal Medicine

## 2020-03-04 ENCOUNTER — Inpatient Hospital Stay: Payer: Medicare Other | Attending: Hematology

## 2020-03-04 DIAGNOSIS — Z955 Presence of coronary angioplasty implant and graft: Secondary | ICD-10-CM | POA: Diagnosis not present

## 2020-03-04 DIAGNOSIS — K5730 Diverticulosis of large intestine without perforation or abscess without bleeding: Secondary | ICD-10-CM | POA: Diagnosis not present

## 2020-03-04 DIAGNOSIS — R7989 Other specified abnormal findings of blood chemistry: Secondary | ICD-10-CM | POA: Insufficient documentation

## 2020-03-04 DIAGNOSIS — D649 Anemia, unspecified: Secondary | ICD-10-CM | POA: Diagnosis not present

## 2020-03-04 DIAGNOSIS — I5020 Unspecified systolic (congestive) heart failure: Secondary | ICD-10-CM | POA: Diagnosis not present

## 2020-03-04 DIAGNOSIS — C7951 Secondary malignant neoplasm of bone: Secondary | ICD-10-CM | POA: Diagnosis present

## 2020-03-04 DIAGNOSIS — Z7982 Long term (current) use of aspirin: Secondary | ICD-10-CM

## 2020-03-04 DIAGNOSIS — I130 Hypertensive heart and chronic kidney disease with heart failure and stage 1 through stage 4 chronic kidney disease, or unspecified chronic kidney disease: Secondary | ICD-10-CM | POA: Diagnosis not present

## 2020-03-04 DIAGNOSIS — Z7952 Long term (current) use of systemic steroids: Secondary | ICD-10-CM

## 2020-03-04 DIAGNOSIS — Z66 Do not resuscitate: Secondary | ICD-10-CM | POA: Diagnosis present

## 2020-03-04 DIAGNOSIS — Z8673 Personal history of transient ischemic attack (TIA), and cerebral infarction without residual deficits: Secondary | ICD-10-CM

## 2020-03-04 DIAGNOSIS — M109 Gout, unspecified: Secondary | ICD-10-CM | POA: Diagnosis present

## 2020-03-04 DIAGNOSIS — F0150 Vascular dementia without behavioral disturbance: Secondary | ICD-10-CM | POA: Diagnosis present

## 2020-03-04 DIAGNOSIS — E785 Hyperlipidemia, unspecified: Secondary | ICD-10-CM | POA: Diagnosis not present

## 2020-03-04 DIAGNOSIS — N179 Acute kidney failure, unspecified: Secondary | ICD-10-CM | POA: Diagnosis not present

## 2020-03-04 DIAGNOSIS — I2510 Atherosclerotic heart disease of native coronary artery without angina pectoris: Secondary | ICD-10-CM | POA: Diagnosis present

## 2020-03-04 DIAGNOSIS — Z87891 Personal history of nicotine dependence: Secondary | ICD-10-CM | POA: Diagnosis not present

## 2020-03-04 DIAGNOSIS — R410 Disorientation, unspecified: Secondary | ICD-10-CM | POA: Diagnosis not present

## 2020-03-04 DIAGNOSIS — C61 Malignant neoplasm of prostate: Secondary | ICD-10-CM

## 2020-03-04 DIAGNOSIS — Z20822 Contact with and (suspected) exposure to covid-19: Secondary | ICD-10-CM | POA: Diagnosis present

## 2020-03-04 DIAGNOSIS — Z79899 Other long term (current) drug therapy: Secondary | ICD-10-CM

## 2020-03-04 DIAGNOSIS — R9721 Rising PSA following treatment for malignant neoplasm of prostate: Secondary | ICD-10-CM | POA: Insufficient documentation

## 2020-03-04 DIAGNOSIS — G893 Neoplasm related pain (acute) (chronic): Secondary | ICD-10-CM | POA: Diagnosis not present

## 2020-03-04 DIAGNOSIS — I5022 Chronic systolic (congestive) heart failure: Secondary | ICD-10-CM | POA: Diagnosis present

## 2020-03-04 DIAGNOSIS — R748 Abnormal levels of other serum enzymes: Secondary | ICD-10-CM | POA: Insufficient documentation

## 2020-03-04 DIAGNOSIS — Z79891 Long term (current) use of opiate analgesic: Secondary | ICD-10-CM

## 2020-03-04 DIAGNOSIS — R590 Localized enlarged lymph nodes: Secondary | ICD-10-CM | POA: Insufficient documentation

## 2020-03-04 DIAGNOSIS — R338 Other retention of urine: Secondary | ICD-10-CM | POA: Diagnosis not present

## 2020-03-04 DIAGNOSIS — N1832 Chronic kidney disease, stage 3b: Secondary | ICD-10-CM | POA: Diagnosis not present

## 2020-03-04 DIAGNOSIS — G9341 Metabolic encephalopathy: Secondary | ICD-10-CM | POA: Diagnosis present

## 2020-03-04 DIAGNOSIS — I10 Essential (primary) hypertension: Secondary | ICD-10-CM | POA: Diagnosis not present

## 2020-03-04 DIAGNOSIS — D539 Nutritional anemia, unspecified: Secondary | ICD-10-CM | POA: Diagnosis present

## 2020-03-04 DIAGNOSIS — R7402 Elevation of levels of lactic acid dehydrogenase (LDH): Secondary | ICD-10-CM | POA: Insufficient documentation

## 2020-03-04 DIAGNOSIS — N401 Enlarged prostate with lower urinary tract symptoms: Secondary | ICD-10-CM | POA: Diagnosis present

## 2020-03-04 LAB — CMP (CANCER CENTER ONLY)
ALT: 7 U/L (ref 0–44)
AST: 15 U/L (ref 15–41)
Albumin: 2.8 g/dL — ABNORMAL LOW (ref 3.5–5.0)
Alkaline Phosphatase: 228 U/L — ABNORMAL HIGH (ref 38–126)
Anion gap: 10 (ref 5–15)
BUN: 42 mg/dL — ABNORMAL HIGH (ref 8–23)
CO2: 30 mmol/L (ref 22–32)
Calcium: 9 mg/dL (ref 8.9–10.3)
Chloride: 100 mmol/L (ref 98–111)
Creatinine: 3.89 mg/dL (ref 0.61–1.24)
GFR, Estimated: 14 mL/min — ABNORMAL LOW (ref >60)
Glucose, Bld: 165 mg/dL — ABNORMAL HIGH (ref 70–99)
Potassium: 4.3 mmol/L (ref 3.5–5.1)
Sodium: 140 mmol/L (ref 135–145)
Total Bilirubin: 0.5 mg/dL (ref 0.3–1.2)
Total Protein: 6.3 g/dL — ABNORMAL LOW (ref 6.5–8.1)

## 2020-03-04 LAB — CBC WITH DIFFERENTIAL/PLATELET
Abs Immature Granulocytes: 0.12 10*3/uL — ABNORMAL HIGH (ref 0.00–0.07)
Basophils Absolute: 0 10*3/uL (ref 0.0–0.1)
Basophils Relative: 1 %
Eosinophils Absolute: 0 10*3/uL (ref 0.0–0.5)
Eosinophils Relative: 1 %
HCT: 25.4 % — ABNORMAL LOW (ref 39.0–52.0)
Hemoglobin: 7.5 g/dL — ABNORMAL LOW (ref 13.0–17.0)
Immature Granulocytes: 2 %
Lymphocytes Relative: 18 %
Lymphs Abs: 1.1 10*3/uL (ref 0.7–4.0)
MCH: 30.4 pg (ref 26.0–34.0)
MCHC: 29.5 g/dL — ABNORMAL LOW (ref 30.0–36.0)
MCV: 102.8 fL — ABNORMAL HIGH (ref 80.0–100.0)
Monocytes Absolute: 0.5 10*3/uL (ref 0.1–1.0)
Monocytes Relative: 8 %
Neutro Abs: 4.5 10*3/uL (ref 1.7–7.7)
Neutrophils Relative %: 70 %
Platelets: 222 10*3/uL (ref 150–400)
RBC: 2.47 MIL/uL — ABNORMAL LOW (ref 4.22–5.81)
RDW: 16.5 % — ABNORMAL HIGH (ref 11.5–15.5)
WBC: 6.3 10*3/uL (ref 4.0–10.5)
nRBC: 1.4 % — ABNORMAL HIGH (ref 0.0–0.2)

## 2020-03-04 LAB — RESPIRATORY PANEL BY RT PCR (FLU A&B, COVID)
Influenza A by PCR: NEGATIVE
Influenza B by PCR: NEGATIVE
SARS Coronavirus 2 by RT PCR: NEGATIVE

## 2020-03-04 LAB — PREPARE RBC (CROSSMATCH)

## 2020-03-04 LAB — VITAMIN D 25 HYDROXY (VIT D DEFICIENCY, FRACTURES): Vit D, 25-Hydroxy: 144.39 ng/mL — ABNORMAL HIGH (ref 30–100)

## 2020-03-04 MED ORDER — NEPRO/CARBSTEADY PO LIQD
237.0000 mL | Freq: Two times a day (BID) | ORAL | Status: DC
Start: 2020-03-05 — End: 2020-03-04

## 2020-03-04 MED ORDER — ISOSORB DINITRATE-HYDRALAZINE 20-37.5 MG PO TABS
1.0000 | ORAL_TABLET | Freq: Two times a day (BID) | ORAL | Status: DC
  Administered 2020-03-04 – 2020-03-12 (×16): 1 via ORAL
  Filled 2020-03-04 (×17): qty 1

## 2020-03-04 MED ORDER — ACETAMINOPHEN 325 MG PO TABS: 650.0000 mg | ORAL_TABLET | Freq: Four times a day (QID) | ORAL | Status: DC | PRN

## 2020-03-04 MED ORDER — SODIUM CHLORIDE 0.45 % IV SOLN: INTRAVENOUS | Status: AC

## 2020-03-04 MED ORDER — NEPRO/CARBSTEADY PO LIQD: 237.0000 mL | Freq: Two times a day (BID) | ORAL | Status: DC

## 2020-03-04 MED ORDER — LACTATED RINGERS IV BOLUS
1000.0000 mL | Freq: Once | INTRAVENOUS | Status: AC
  Administered 2020-03-04: 1000 mL via INTRAVENOUS

## 2020-03-04 MED ORDER — PREDNISONE 5 MG PO TABS
5.0000 mg | ORAL_TABLET | Freq: Every day | ORAL | Status: DC
  Administered 2020-03-05 – 2020-03-12 (×8): 5 mg via ORAL
  Filled 2020-03-04 (×8): qty 1

## 2020-03-04 MED ORDER — ONDANSETRON HCL 4 MG PO TABS: 4.0000 mg | ORAL_TABLET | Freq: Four times a day (QID) | ORAL | Status: DC | PRN

## 2020-03-04 MED ORDER — FENTANYL 25 MCG/HR TD PT72
1.0000 | MEDICATED_PATCH | TRANSDERMAL | Status: DC
  Administered 2020-03-07 – 2020-03-10 (×2): 1 via TRANSDERMAL
  Filled 2020-03-04 (×2): qty 1

## 2020-03-04 MED ORDER — VITAMIN B-12 1000 MCG PO TABS
1000.0000 ug | ORAL_TABLET | Freq: Every day | ORAL | Status: DC
  Administered 2020-03-05 – 2020-03-12 (×8): 1000 ug via ORAL
  Filled 2020-03-04 (×8): qty 1

## 2020-03-04 MED ORDER — ACETAMINOPHEN 650 MG RE SUPP: 650.0000 mg | Freq: Four times a day (QID) | RECTAL | Status: DC | PRN

## 2020-03-04 MED ORDER — TAMSULOSIN HCL 0.4 MG PO CAPS
0.4000 mg | ORAL_CAPSULE | Freq: Two times a day (BID) | ORAL | Status: DC
  Administered 2020-03-04 – 2020-03-12 (×16): 0.4 mg via ORAL
  Filled 2020-03-04 (×16): qty 1

## 2020-03-04 MED ORDER — AXUMIN (FLUCICLOVINE F 18) INJECTION
8.6000 | Freq: Once | INTRAVENOUS | Status: AC | PRN
  Administered 2020-03-04: 8.6 via INTRAVENOUS

## 2020-03-04 MED ORDER — CARVEDILOL 12.5 MG PO TABS
12.5000 mg | ORAL_TABLET | Freq: Two times a day (BID) | ORAL | Status: DC
  Administered 2020-03-05 – 2020-03-12 (×14): 12.5 mg via ORAL
  Filled 2020-03-04 (×15): qty 1

## 2020-03-04 MED ORDER — HEPARIN SODIUM (PORCINE) 5000 UNIT/ML IJ SOLN
5000.0000 [IU] | Freq: Three times a day (TID) | INTRAMUSCULAR | Status: DC
  Administered 2020-03-04 – 2020-03-12 (×22): 5000 [IU] via SUBCUTANEOUS
  Filled 2020-03-04 (×22): qty 1

## 2020-03-04 MED ORDER — SODIUM CHLORIDE 0.9% IV SOLUTION: Freq: Once | INTRAVENOUS | Status: AC

## 2020-03-04 MED ORDER — SENNOSIDES-DOCUSATE SODIUM 8.6-50 MG PO TABS: 2.0000 | ORAL_TABLET | Freq: Every day | ORAL | Status: DC | PRN

## 2020-03-04 MED ORDER — NEPRO/CARBSTEADY PO LIQD
237.0000 mL | Freq: Three times a day (TID) | ORAL | Status: DC
  Administered 2020-03-04 – 2020-03-12 (×19): 237 mL via ORAL
  Filled 2020-03-04 (×24): qty 237

## 2020-03-04 MED ORDER — POLYETHYLENE GLYCOL 3350 17 G PO PACK
17.0000 g | PACK | Freq: Every day | ORAL | Status: DC
  Administered 2020-03-05 – 2020-03-12 (×8): 17 g via ORAL
  Filled 2020-03-04 (×8): qty 1

## 2020-03-04 MED ORDER — OXYCODONE HCL 5 MG PO TABS: 5.0000 mg | ORAL_TABLET | ORAL | Status: DC | PRN

## 2020-03-04 MED ORDER — ONDANSETRON HCL 4 MG/2ML IJ SOLN: 4.0000 mg | Freq: Four times a day (QID) | INTRAMUSCULAR | Status: DC | PRN

## 2020-03-04 MED ORDER — ASPIRIN EC 81 MG PO TBEC
81.0000 mg | DELAYED_RELEASE_TABLET | Freq: Every day | ORAL | Status: DC
  Administered 2020-03-05 – 2020-03-12 (×8): 81 mg via ORAL
  Filled 2020-03-04 (×8): qty 1

## 2020-03-04 NOTE — Telephone Encounter (Signed)
Contacted daughter with Dr. Grier Mitts directions r/t lab results. Advised her that per Dr. [name], patient kidney function has become impaired and he needs immediate evaluation. Patient should go to ED and contact provider that prescribes diuretics. She verbalized understanding.  She stated patient has "not been himself" for past few days - confused and weak. Dr. [name] informed of changes.

## 2020-03-04 NOTE — H&P (Signed)
[facility] History and Physical  DERREON [name] MWN:[id number] DOB: Jun 05, 1930 DOA: 03/04/2020   PCP: [name], MD  Specialists: Dr. [name] is his medical oncologist.  Also followed by [facility] for heart failure.  Chief Complaint: Weakness, dark urine, poor appetite  HPI: Tony [name] is a 86 y.o. male with a past medical history of metastatic prostate cancer with mets to the bones, chronic systolic CHF, severe osteoarthritis on narcotics, chronic kidney disease stage IIIb, coronary artery disease who was hospitalized about a month or so ago for congestive heart failure.  This was newly diagnosed.  Patient was seen by cardiology.  Patient was on Zytiga for his prostate cancer which was thought to be the reason for his CHF.  The medication was discontinued.  Patient has been placed on Lasix beta-blocker BiDil.  He was not given ACE inhibitor or ARB due to his chronic kidney disease.  His EF is known to be about 35 to 40%. He had significant lower extremity edema associated with a CHF last month which has significantly improved.  Patient has been taking his medications daily.  Over the last few days he has noticed that he is becoming more more weak.  He denies any nausea or vomiting or diarrhea.  No fever or chills.  No sick contacts.  No other acute illness.  Patient underwent blood work this morning at oncology.  He also underwent a PET scan.  He was found to have acute renal failure with a significant rise in creatinine.  He does mention that he has had dark urine.  He has been passing urine and last urinated about an hour or so ago.  He denies any shortness of breath or chest pain.  In the emergency department patient was noted to have worsening renal function.  He was also noted to have a lower hemoglobin than baseline.  Patient denies any active bleeding.  He has had blood transfusions previously.  He will be brought into the hospital for further management.  Home  Medications: Prior to Admission medications   Medication Sig Start Date End Date Taking? Authorizing Provider  aspirin EC 81 MG tablet Take 1 tablet (81 mg total) by mouth daily. 01/29/17   [name], MD  BIDIL 20-37.5 MG tablet TAKE 1 TABLET BY MOUTH TWICE DAILY 05/16/19   [name], MD  calcium-vitamin D (OSCAL 500/200 D-3) 500-200 MG-UNIT tablet Take 1 tablet by mouth daily with breakfast. 08/09/17   Brunetta Genera, MD  carvedilol (COREG) 12.5 MG tablet Take 1 tablet (12.5 mg total) by mouth 2 (two) times daily with a meal. 01/31/20   Mercy Riding, MD  fentaNYL (DURAGESIC) 25 MCG/HR Place 1 patch onto the skin every 3 (three) days. 02/10/20   Brunetta Genera, MD  furosemide (LASIX) 40 MG tablet Take 1 tablet (40 mg total) by mouth daily. Make take one-half additional tablet 20 mg by mouth as needed for swelling or weight gain of 3 pounds or more in a day or 5 pounds or more in a week. 02/20/20   Tommie Raymond, NP  Multiple Vitamins-Minerals (MULTI COMPLETE PO) Take 1 tablet by mouth daily.     [provider]  nitroGLYCERIN (NITROSTAT) 0.4 MG SL tablet Place 1 tablet (0.4 mg total) under the tongue every 5 (five) minutes as needed for chest pain (3 maximum before seeking care). 11/17/16   Marin Olp, MD  Omega-3 1000 MG CAPS Take 1 capsule  by mouth daily.     [provider]  oxyCODONE (OXY IR/ROXICODONE) 5 MG immediate release tablet Take 1 tablet (5 mg total) by mouth every 4 (four) hours as needed for severe pain. 10/17/19   Brunetta Genera, MD  polyethylene glycol [facility]) packet Take 17 g by mouth daily. 09/06/17   Brunetta Genera, MD  predniSONE (DELTASONE) 5 MG tablet Take 1 tablet (5 mg total) by mouth daily with breakfast. 01/31/20 10/27/20  Mercy Riding, MD  senna-docusate (SENNA S) 8.6-50 MG tablet Take 2 tablets by mouth 2 (two) times daily. Patient taking differently: Take 2 tablets by mouth as needed.  04/17/19   Brunetta Genera, MD  tamsulosin (FLOMAX) 0.4 MG CAPS capsule TAKE 1 CAPSULE BY MOUTH TWICE DAILY 06/17/19   [name], MD  vitamin B-12 (CYANOCOBALAMIN) 1000 MCG tablet Take 1 tablet (1,000 mcg total) by mouth daily. 07/22/17   Cristal Ford, DO  Vitamin D, Ergocalciferol, (DRISDOL) 1.25 MG (50000 UNIT) CAPS capsule Take 1 capsule Monday, Wednesday and Friday. 07/04/19   Brunetta Genera, MD    Allergies:  Allergies  Allergen Reactions  . Gabapentin Other (See Comments)    Patient states caused hallucinations     Past Medical History: Past Medical History:  Diagnosis Date  . BPH associated with nocturia    flomax 0.4--> 0.8 mg trial. nocturia if has coffee. some incontinence  . CAD (coronary artery disease)    LAD Stent 2012. 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   . Gout    no rx. apparently 1x in past  . History of stroke    no aspirin before stroke. no deficits. slurred words at time of stroke  . Hyperlipidemia   . Hypertension     Past Surgical History:  Procedure Laterality Date  . CARDIAC SURGERY     Stint  . CORONARY STENT PLACEMENT    . left arm fracture s/p surgery    . right leg fracture s.p surgery- screws like arm      Social History: Uses a walker at home.  Lives with family.  No smoking alcohol use.  Family History:  Family History  Problem Relation Age of Onset  . Breast cancer Mother   . Heart disease Father   . Heart disease Brother        x2  . Prostate cancer Brother      Review of Systems - History obtained from the patient General ROS: positive for  - fatigue Psychological ROS: negative Ophthalmic ROS: negative ENT ROS: negative Allergy and Immunology ROS: negative Hematological and Lymphatic ROS: negative Endocrine ROS: negative Respiratory ROS: no cough, shortness of breath, or wheezing Cardiovascular ROS: no chest pain or dyspnea on exertion Gastrointestinal ROS: no abdominal pain, change in bowel habits, or  black or bloody stools Genito-Urinary ROS: no dysuria, trouble voiding, or hematuria Musculoskeletal ROS: negative Neurological ROS: no TIA or stroke symptoms Dermatological ROS: negative  Physical Examination  Vitals:   03/04/20 1622 03/04/20 1728 03/04/20 1832  BP: (!) 142/81 116/64 104/65  Pulse: 81 75 77  Resp: 16 18 18   Temp: 98.5 F (36.9 C)    TempSrc: Oral    SpO2: 100% 98% 100%    BP 104/65   Pulse 77   Temp 98.5 F (36.9 C) (Oral)   Resp 18   SpO2 100%   General appearance: alert, cooperative, appears stated age, distracted and no distress. noted to be  slightly lethargic Head: Normocephalic, without obvious abnormality, atraumatic Eyes: conjunctivae/corneas clear. PERRL, EOM's intact.  Throat: Dry mucous membrane.  No oral lesions. Neck: no adenopathy, no carotid bruit, no JVD, supple, symmetrical, trachea midline and thyroid not enlarged, symmetric, no tenderness/mass/nodules Resp: clear to auscultation bilaterally Cardio: regular rate and rhythm, S1, S2 normal, no murmur, click, rub or gallop GI: soft, non-tender; bowel sounds normal; no masses,  no organomegaly Extremities: Minimal edema bilateral lower extremities. Pulses: 2+ and symmetric Skin: Skin color, texture, turgor normal. No rashes or lesions Lymph nodes: Cervical, supraclavicular, and axillary nodes normal. Neurologic: Alert.  No obvious focal neurological deficits.   Labs on Admission: I have personally reviewed following labs and imaging studies  CBC: Recent Labs  Lab 03/04/20 1404  WBC 6.3  NEUTROABS 4.5  HGB 7.5*  HCT 25.4*  MCV 102.8*  PLT 716   Basic Metabolic Panel: Recent Labs  Lab 03/04/20 1404  NA 140  K 4.3  CL 100  CO2 30  GLUCOSE 165*  BUN 42*  CREATININE 3.89*  CALCIUM 9.0   GFR: Estimated Creatinine Clearance: 13.5 mL/min (A) (by C-G formula based on SCr of 3.89 mg/dL [facility])). Liver Function Tests: Recent Labs  Lab 03/04/20 1404  AST 15  ALT 7  ALKPHOS  228*  BILITOT 0.5  PROT 6.3*  ALBUMIN 2.8*     Radiological Exams on Admission: US Renal  Result Date: 03/04/2020 CLINICAL DATA:  Renal failure EXAM: RENAL / URINARY TRACT ULTRASOUND COMPLETE COMPARISON:  Head CT 03/04/2020 FINDINGS: Right Kidney: Renal measurements: 9.6 x 4.2 x 4.4 cm = volume: 92 mL. Echogenicity is increased. No mass or hydronephrosis visualized. Left Kidney: Renal measurements: 8.8 x 4.3 x 3.9 cm = volume: 77 mL. Echogenicity is increased. No mass or hydronephrosis visualized. Bladder: Appears normal for degree of bladder distention. Other: None. IMPRESSION: Increased echogenicity of the renal parenchyma suggestive of renal disease. Electronically Signed   By: Iven Finn M.D.   On: 03/04/2020 18:16   NM PET (AXUMIN) SKULL BASE TO MID THIGH  Result Date: 03/04/2020 CLINICAL DATA:  Prostate carcinoma with biochemical recurrence. Increasing PSA. Prostate cancer with bone metastasis. Hormonal therapy. PSA equal 46 EXAM: NUCLEAR MEDICINE PET SKULL BASE TO THIGH TECHNIQUE: 8.6 mCi F-18 Fluciclovine was injected intravenously. Full-ring PET imaging was performed from the skull base to thigh after the radiotracer. CT data was obtained and used for attenuation correction and anatomic localization. COMPARISON:  Fluciclovine PET-CT scan 10/16/2019 FINDINGS: NECK No radiotracer activity in neck lymph nodes. Incidental CT finding: None CHEST No radiotracer accumulation within mediastinal or hilar lymph nodes. No suspicious pulmonary nodules on the CT scan. Incidental CT finding: None ABDOMEN/PELVIS Prostate: Diffuse activity in the prostate gland similar comparison exam with SUV max equal 4.4 compared SUV max equal 4.7. Lymph nodes: No abnormal radiotracer accumulation within pelvic or abdominal nodes. Liver: No evidence of liver metastasis Incidental CT finding: Sigmoid diverticulosis. SKELETON Again demonstrated diffuse sclerotic skeletal metastasis throughout the axillary and  appendicular skeleton. Internal fixation of the RIGHT femur. On the background of diffuse sclerotic skeletal metastasis, there are several foci of discrete radiotracer activity not changed from prior. For example lesion in the intertrochanteric region of the proximal left femur SUV max equal 5.5 compared SUV max equal 5.7. Lesion in the mid thoracic spine with SUV max equal 3.7 compared SUV max equal 3.9. No evidence of progressive skeletal metastasis by Fluciclovine PET imaging. IMPRESSION: 1. No evidence prostate cancer progression by the Fluciclovine PET CT  imaging. Consider Pylarify (F18 PSMA) PET imaging (available at [facility]) for potential increased specificity/sensitivity. 2. Widespread diffuse sclerotic skeletal metastasis the majority of which is not radiotracer avid. Several foci discrete radiotracer avidity within the spine and pelvis are similar to comparison exam 3. No evidence of nodal metastasis or visceral metastasis. Electronically Signed   By: Suzy Bouchard M.D.   On: 03/04/2020 16:22      Problem List  Principal Problem:   ARF (acute renal failure) (HCC) Active Problems:   Essential hypertension   Coronary artery disease involving native coronary artery of native heart without angina pectoris   Chronic kidney disease, stage 3b (HCC)   Macrocytic anemia   Prostate cancer metastatic to bone (HCC)   HFrEF (heart failure with reduced ejection fraction) ([place])   Assessment: This is a 86 year old African-American male with past medical history as stated earlier who comes in with a few day history of feeling more weak than usual.  Poor oral intake.  He is noted to have worsening renal failure along with anemia.  Acute renal failure likely due to poor oral intake along with use of diuretics.  Plan:  1. Acute renal failure in the setting of chronic kidney disease stage IIIb: Renal ultrasound does not show any evidence for obstruction.  Medical renal disease was noted.  Renal  failure most likely due to the fact that he has been on furosemide and has had poor oral intake.  Progression of renal disease is also possibility.  UA is pending.  We will give him IV fluids.  Recheck his labs tomorrow.  His primary care physician has made a referral to nephrology ([facility]) but patient has not seen them yet.  May need to get a nephrology consult depending on his renal function tomorrow.    2. Macrocytic anemia: Hemoglobin noted to be 7.5.  It was 8.6 about 10 days ago.  The patient has received blood transfusions previously.  He is fatigued.  He has symptomatic anemia.  We will give him 1 unit of PRBC.  No evidence of active bleeding.  Recheck hemoglobin tomorrow morning.  3.  Metastatic prostate cancer: He is on prednisone.  Zytiga was discontinued recently as it was thought to have caused his congestive heart failure.  He underwent a PET scan this morning.  We will request oncology to weigh in.  4.  Chronic systolic CHF: EF known to be 35 to 40%.  He is noted to be on carvedilol, BiDil, furosemide.  Will hold the furosemide.  Continue with Bidil.  Resume carvedilol from tomorrow.  His lower extremity edema has improved significantly per patient and his daughter.  Currently does not appear to be acutely decompensated.  5.  Severe osteoarthritis with chronic pain: He is noted to be on fentanyl patch as well as oral oxycodone.  These will be continued.  6.  Generalized weakness/physical deconditioning: Uses a walker at home.  Will request PT and OT to evaluate.   DVT Prophylaxis: Subcutaneous heparin Code Status: DNR.  This was verified with patient's daughter. Family Communication: Discussed with the patient's daughter Disposition: Hopefully return home when improved Consults called: Dr. [name] added to the treatment team  Admission Status: Status is: Observation  The patient remains OBS appropriate and will d/c before 2 midnights.  Dispo: The patient is from: Home               Anticipated d/c is to: Home  Anticipated d/c date is: 1 day              Patient currently is not medically stable to d/c.   Severity of Illness: The appropriate patient status for this patient is OBSERVATION. Observation status is judged to be reasonable and necessary in order to provide the required intensity of service to ensure the patient's safety. The patient's presenting symptoms, physical exam findings, and initial radiographic and laboratory data in the context of their medical condition is felt to place them at decreased risk for further clinical deterioration. Furthermore, it is anticipated that the patient will be medically stable for discharge from the hospital within 2 midnights of admission. The following factors support the patient status of observation.   " The patient's presenting symptoms include generalized weakness. " The physical exam findings include slightly lethargic. " The initial radiographic and laboratory data are acute renal failure.     Further management decisions will depend on results of further testing and patient's response to treatment.   Barney Gertsch [employer]  [facility] operational officer on [email or web address]  03/04/2020, 7:05 PM

## 2020-03-04 NOTE — ED Notes (Signed)
2 attempts to place an IV by 2 staff members. Family requested IV team vs 3rd stick. IV team consulted.

## 2020-03-04 NOTE — ED Provider Notes (Signed)
[facility] DEPT Provider Note   CSN: [id number] Arrival date & time: 03/04/20  1605     History Chief Complaint  Patient presents with  . Abnormal Lab    Tony [name] is a 86 y.o. male.  HPI Patient brought in for worsening kidney function.  Sent in by oncology.  Creatinine of 3.89.  Around 2 weeks ago creatinine was 2.7.  Around 3 to 4 weeks ago creatinine was 2.5.  Patient has had decreased oral intake.  History of prostate cancer.  Had blood work drawn for PET scan today.  Told to come in after worsening renal function.  States he feels as if he is able to urinate freely.  Does feel lightheaded at times.  Has a history of heart failure and is on diuretics for that.  No fevers.  No flank pain.  No nausea or vomiting.  Patient's daughter states there has been some confusion.  Urinalysis had been drawn by PCP but results not known.    Past Medical History:  Diagnosis Date  . BPH associated with nocturia    flomax 0.4--> 0.8 mg trial. nocturia if has coffee. some incontinence  . CAD (coronary artery disease)    LAD Stent 2012. 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   . Gout    no rx. apparently 1x in past  . History of stroke    no aspirin before stroke. no deficits. slurred words at time of stroke  . Hyperlipidemia   . Hypertension     Patient Active Problem List   Diagnosis Date Noted  . DNR (do not resuscitate)   . HFrEF (heart failure with reduced ejection fraction) ([place]) 01/26/2020  . Bilateral leg edema 01/26/2020  . Osteoarthritis of knee 07/16/2019  . Prostate cancer metastatic to bone ([place]) 08/07/2017  . Macrocytic anemia 07/20/2017  . Thrombocytopenia ([place]) 07/20/2017  . History of complete AV block 10/30/2016  . Chronic kidney disease, stage 3b ([place]) 10/30/2016  . Essential hypertension   . Hyperlipidemia   . BPH associated with nocturia   . Coronary artery disease involving native coronary artery of  native heart without angina pectoris   . History of stroke   . Gout     Past Surgical History:  Procedure Laterality Date  . CARDIAC SURGERY     Stint  . CORONARY STENT PLACEMENT    . left arm fracture s/p surgery    . right leg fracture s.p surgery- screws like arm         Family History  Problem Relation Age of Onset  . Breast cancer Mother   . Heart disease Father   . Heart disease Brother        x2  . Prostate cancer Brother     Social History   Tobacco Use  . Smoking status: Former Smoker    Packs/day: 0.50    Years: 1.00    Pack years: 0.50    Types: Cigarettes    Quit date: 05/01/1952    Years since quitting: 67.8  . Smokeless tobacco: Never Used  Vaping Use  . Vaping Use: Never used  Substance Use Topics  . Alcohol use: No  . Drug use: No    Home Medications Prior to Admission medications   Medication Sig Start Date End Date Taking? Authorizing Provider  aspirin EC 81 MG tablet Take 1 tablet (81 mg total) by mouth daily. 01/29/17   [name], MD  BIDIL 20-37.5 MG tablet TAKE 1 TABLET BY MOUTH TWICE DAILY 05/16/19   [name], MD  calcium-vitamin D (OSCAL 500/200 D-3) 500-200 MG-UNIT tablet Take 1 tablet by mouth daily with breakfast. 08/09/17   Brunetta Genera, MD  carvedilol (COREG) 12.5 MG tablet Take 1 tablet (12.5 mg total) by mouth 2 (two) times daily with a meal. 01/31/20   Mercy Riding, MD  fentaNYL (DURAGESIC) 25 MCG/HR Place 1 patch onto the skin every 3 (three) days. 02/10/20   Brunetta Genera, MD  furosemide (LASIX) 40 MG tablet Take 1 tablet (40 mg total) by mouth daily. Make take one-half additional tablet 20 mg by mouth as needed for swelling or weight gain of 3 pounds or more in a day or 5 pounds or more in a week. 02/20/20   Tommie Raymond, NP  Multiple Vitamins-Minerals (MULTI COMPLETE PO) Take 1 tablet by mouth daily.     [provider]  nitroGLYCERIN (NITROSTAT) 0.4 MG SL tablet Place 1 tablet (0.4 mg  total) under the tongue every 5 (five) minutes as needed for chest pain (3 maximum before seeking care). 11/17/16   Marin Olp, MD  Omega-3 1000 MG CAPS Take 1 capsule by mouth daily.     [provider]  oxyCODONE (OXY IR/ROXICODONE) 5 MG immediate release tablet Take 1 tablet (5 mg total) by mouth every 4 (four) hours as needed for severe pain. 10/17/19   Brunetta Genera, MD  polyethylene glycol [facility]) packet Take 17 g by mouth daily. 09/06/17   Brunetta Genera, MD  predniSONE (DELTASONE) 5 MG tablet Take 1 tablet (5 mg total) by mouth daily with breakfast. 01/31/20 10/27/20  Mercy Riding, MD  senna-docusate (SENNA S) 8.6-50 MG tablet Take 2 tablets by mouth 2 (two) times daily. Patient taking differently: Take 2 tablets by mouth as needed.  04/17/19   Brunetta Genera, MD  tamsulosin (FLOMAX) 0.4 MG CAPS capsule TAKE 1 CAPSULE BY MOUTH TWICE DAILY 06/17/19   [name], MD  vitamin B-12 (CYANOCOBALAMIN) 1000 MCG tablet Take 1 tablet (1,000 mcg total) by mouth daily. 07/22/17   Cristal Ford, DO  Vitamin D, Ergocalciferol, (DRISDOL) 1.25 MG (50000 UNIT) CAPS capsule Take 1 capsule Monday, Wednesday and Friday. 07/04/19   Brunetta Genera, MD    Allergies    Gabapentin  Review of Systems   Review of Systems  Constitutional: Positive for fatigue. Negative for appetite change.  HENT: Negative for congestion.   Respiratory: Negative for shortness of breath.   Cardiovascular: Negative for chest pain.  Gastrointestinal: Negative for abdominal pain.  Genitourinary: Negative for flank pain.  Skin: Negative for rash.  Neurological: Negative for dizziness.  Psychiatric/Behavioral: Positive for confusion.    Physical Exam Updated Vital Signs BP 116/64   Pulse 75   Temp 98.5 F (36.9 C) (Oral)   Resp 18   SpO2 98%   Physical Exam Vitals and nursing note reviewed.  Constitutional:      Appearance: Normal appearance.  HENT:     Head: Atraumatic.    Eyes:     Extraocular Movements: Extraocular movements intact.  Cardiovascular:     Rate and Rhythm: Normal rate and regular rhythm.  Pulmonary:     Breath sounds: No wheezing or rhonchi.  Musculoskeletal:     Cervical back: Neck supple.     Comments: Trace edema bilateral lower extremities.  Skin:    General: Skin is warm.  Capillary Refill: Capillary refill takes less than 2 seconds.  Neurological:     Mental Status: He is alert and oriented to person, place, and time.     ED Results / Procedures / Treatments   Labs (all labs ordered are listed, but only abnormal results are displayed) Labs Reviewed  RESPIRATORY PANEL BY RT PCR (FLU A&B, COVID)  URINALYSIS, ROUTINE W REFLEX MICROSCOPIC    EKG None  Radiology US Renal  Result Date: 03/04/2020 CLINICAL DATA:  Renal failure EXAM: RENAL / URINARY TRACT ULTRASOUND COMPLETE COMPARISON:  Head CT 03/04/2020 FINDINGS: Right Kidney: Renal measurements: 9.6 x 4.2 x 4.4 cm = volume: 92 mL. Echogenicity is increased. No mass or hydronephrosis visualized. Left Kidney: Renal measurements: 8.8 x 4.3 x 3.9 cm = volume: 77 mL. Echogenicity is increased. No mass or hydronephrosis visualized. Bladder: Appears normal for degree of bladder distention. Other: None. IMPRESSION: Increased echogenicity of the renal parenchyma suggestive of renal disease. Electronically Signed   By: Iven Finn M.D.   On: 03/04/2020 18:16   NM PET (AXUMIN) SKULL BASE TO MID THIGH  Result Date: 03/04/2020 CLINICAL DATA:  Prostate carcinoma with biochemical recurrence. Increasing PSA. Prostate cancer with bone metastasis. Hormonal therapy. PSA equal 46 EXAM: NUCLEAR MEDICINE PET SKULL BASE TO THIGH TECHNIQUE: 8.6 mCi F-18 Fluciclovine was injected intravenously. Full-ring PET imaging was performed from the skull base to thigh after the radiotracer. CT data was obtained and used for attenuation correction and anatomic localization. COMPARISON:  Fluciclovine PET-CT  scan 10/16/2019 FINDINGS: NECK No radiotracer activity in neck lymph nodes. Incidental CT finding: None CHEST No radiotracer accumulation within mediastinal or hilar lymph nodes. No suspicious pulmonary nodules on the CT scan. Incidental CT finding: None ABDOMEN/PELVIS Prostate: Diffuse activity in the prostate gland similar comparison exam with SUV max equal 4.4 compared SUV max equal 4.7. Lymph nodes: No abnormal radiotracer accumulation within pelvic or abdominal nodes. Liver: No evidence of liver metastasis Incidental CT finding: Sigmoid diverticulosis. SKELETON Again demonstrated diffuse sclerotic skeletal metastasis throughout the axillary and appendicular skeleton. Internal fixation of the RIGHT femur. On the background of diffuse sclerotic skeletal metastasis, there are several foci of discrete radiotracer activity not changed from prior. For example lesion in the intertrochanteric region of the proximal left femur SUV max equal 5.5 compared SUV max equal 5.7. Lesion in the mid thoracic spine with SUV max equal 3.7 compared SUV max equal 3.9. No evidence of progressive skeletal metastasis by Fluciclovine PET imaging. IMPRESSION: 1. No evidence prostate cancer progression by the Fluciclovine PET CT imaging. Consider Pylarify (F18 PSMA) PET imaging (available at [facility]) for potential increased specificity/sensitivity. 2. Widespread diffuse sclerotic skeletal metastasis the majority of which is not radiotracer avid. Several foci discrete radiotracer avidity within the spine and pelvis are similar to comparison exam 3. No evidence of nodal metastasis or visceral metastasis. Electronically Signed   By: Suzy Bouchard M.D.   On: 03/04/2020 16:22    Procedures Procedures (including critical care time)  Medications Ordered in ED Medications  lactated ringers bolus 1,000 mL (has no administration in time range)    ED Course  I have reviewed the triage vital signs and the nursing notes.  Pertinent  labs & imaging results that were available during my care of the patient were reviewed by me and considered in my medical decision making (see chart for details).    MDM Rules/Calculators/A&P  Patient sent in for acute kidney injury.  Creatinine increasing.  I think it is likely secondary to decreased oral intake and patient being on diuretics.  Ultrasound shows renal disease without obstruction.  PET scan also reviewed and showed no cancer progression.  With worsening renal function decreased oral intake and CHF history will admit to hospitalist. Final Clinical Impression(s) / ED Diagnoses Final diagnoses:  AKI (acute kidney injury) [facility])    Rx / [place] Orders ED Discharge Orders    None       Davonna Belling, MD 03/04/20 1825

## 2020-03-04 NOTE — ED Triage Notes (Signed)
Pt sent in by Dr. [name] after having labs done today and seeing that his kidney function has dramatically decreased. Pt was recently placed on lasix after a hospital stay a month ago for CHF. Pt has prostate CA with bone mets and daughter states that he has had more confusion, generalized weakness and pain in his legs recently. Alert and oriented at present.

## 2020-03-04 NOTE — Telephone Encounter (Signed)
CRITICAL VALUE STICKER  CRITICAL VALUE: CR 3.89  DATE & TIME NOTIFIED: 03/04/20; 1510  MESSENGER (representative from lab):Hillary  MD NOTIFIED: Dr.Kale  TIME OF NOTIFICATION: 1520  RESPONSE: Contact patient/daughter. Needs immediate evaluation at ED or with provider that gives diuretics

## 2020-03-05 DIAGNOSIS — D539 Nutritional anemia, unspecified: Secondary | ICD-10-CM

## 2020-03-05 DIAGNOSIS — R29818 Other symptoms and signs involving the nervous system: Secondary | ICD-10-CM | POA: Diagnosis not present

## 2020-03-05 DIAGNOSIS — C7951 Secondary malignant neoplasm of bone: Secondary | ICD-10-CM | POA: Diagnosis not present

## 2020-03-05 DIAGNOSIS — F0150 Vascular dementia without behavioral disturbance: Secondary | ICD-10-CM | POA: Diagnosis present

## 2020-03-05 DIAGNOSIS — I6389 Other cerebral infarction: Secondary | ICD-10-CM | POA: Diagnosis not present

## 2020-03-05 DIAGNOSIS — Z7982 Long term (current) use of aspirin: Secondary | ICD-10-CM | POA: Diagnosis not present

## 2020-03-05 DIAGNOSIS — I130 Hypertensive heart and chronic kidney disease with heart failure and stage 1 through stage 4 chronic kidney disease, or unspecified chronic kidney disease: Secondary | ICD-10-CM | POA: Diagnosis present

## 2020-03-05 DIAGNOSIS — R410 Disorientation, unspecified: Secondary | ICD-10-CM | POA: Diagnosis present

## 2020-03-05 DIAGNOSIS — N401 Enlarged prostate with lower urinary tract symptoms: Secondary | ICD-10-CM | POA: Diagnosis present

## 2020-03-05 DIAGNOSIS — G9389 Other specified disorders of brain: Secondary | ICD-10-CM | POA: Diagnosis not present

## 2020-03-05 DIAGNOSIS — I6782 Cerebral ischemia: Secondary | ICD-10-CM | POA: Diagnosis not present

## 2020-03-05 DIAGNOSIS — G9341 Metabolic encephalopathy: Secondary | ICD-10-CM | POA: Diagnosis present

## 2020-03-05 DIAGNOSIS — Z955 Presence of coronary angioplasty implant and graft: Secondary | ICD-10-CM | POA: Diagnosis not present

## 2020-03-05 DIAGNOSIS — I10 Essential (primary) hypertension: Secondary | ICD-10-CM | POA: Diagnosis not present

## 2020-03-05 DIAGNOSIS — N179 Acute kidney failure, unspecified: Secondary | ICD-10-CM | POA: Diagnosis not present

## 2020-03-05 DIAGNOSIS — Z87891 Personal history of nicotine dependence: Secondary | ICD-10-CM | POA: Diagnosis not present

## 2020-03-05 DIAGNOSIS — Z8673 Personal history of transient ischemic attack (TIA), and cerebral infarction without residual deficits: Secondary | ICD-10-CM | POA: Diagnosis not present

## 2020-03-05 DIAGNOSIS — Z79899 Other long term (current) drug therapy: Secondary | ICD-10-CM | POA: Diagnosis not present

## 2020-03-05 DIAGNOSIS — D649 Anemia, unspecified: Secondary | ICD-10-CM | POA: Diagnosis not present

## 2020-03-05 DIAGNOSIS — M109 Gout, unspecified: Secondary | ICD-10-CM | POA: Diagnosis present

## 2020-03-05 DIAGNOSIS — I2510 Atherosclerotic heart disease of native coronary artery without angina pectoris: Secondary | ICD-10-CM | POA: Diagnosis not present

## 2020-03-05 DIAGNOSIS — G893 Neoplasm related pain (acute) (chronic): Secondary | ICD-10-CM | POA: Diagnosis present

## 2020-03-05 DIAGNOSIS — C61 Malignant neoplasm of prostate: Secondary | ICD-10-CM | POA: Diagnosis present

## 2020-03-05 DIAGNOSIS — R338 Other retention of urine: Secondary | ICD-10-CM | POA: Diagnosis present

## 2020-03-05 DIAGNOSIS — N1832 Chronic kidney disease, stage 3b: Secondary | ICD-10-CM | POA: Diagnosis not present

## 2020-03-05 DIAGNOSIS — E785 Hyperlipidemia, unspecified: Secondary | ICD-10-CM | POA: Diagnosis present

## 2020-03-05 DIAGNOSIS — Z66 Do not resuscitate: Secondary | ICD-10-CM | POA: Diagnosis present

## 2020-03-05 DIAGNOSIS — Z20822 Contact with and (suspected) exposure to covid-19: Secondary | ICD-10-CM | POA: Diagnosis present

## 2020-03-05 DIAGNOSIS — I5022 Chronic systolic (congestive) heart failure: Secondary | ICD-10-CM | POA: Diagnosis present

## 2020-03-05 DIAGNOSIS — Z7952 Long term (current) use of systemic steroids: Secondary | ICD-10-CM | POA: Diagnosis not present

## 2020-03-05 LAB — CBC
HCT: 26.4 % — ABNORMAL LOW (ref 39.0–52.0)
Hemoglobin: 8.2 g/dL — ABNORMAL LOW (ref 13.0–17.0)
MCH: 31.7 pg (ref 26.0–34.0)
MCHC: 31.1 g/dL (ref 30.0–36.0)
MCV: 101.9 fL — ABNORMAL HIGH (ref 80.0–100.0)
Platelets: 184 10*3/uL (ref 150–400)
RBC: 2.59 MIL/uL — ABNORMAL LOW (ref 4.22–5.81)
RDW: 17.9 % — ABNORMAL HIGH (ref 11.5–15.5)
WBC: 6.5 10*3/uL (ref 4.0–10.5)
nRBC: 1.8 % — ABNORMAL HIGH (ref 0.0–0.2)

## 2020-03-05 LAB — BASIC METABOLIC PANEL
Anion gap: 14 (ref 5–15)
BUN: 36 mg/dL — ABNORMAL HIGH (ref 8–23)
CO2: 25 mmol/L (ref 22–32)
Calcium: 8.2 mg/dL — ABNORMAL LOW (ref 8.9–10.3)
Chloride: 97 mmol/L — ABNORMAL LOW (ref 98–111)
Creatinine, Ser: 3.51 mg/dL — ABNORMAL HIGH (ref 0.61–1.24)
GFR, Estimated: 16 mL/min — ABNORMAL LOW (ref >60)
Glucose, Bld: 102 mg/dL — ABNORMAL HIGH (ref 70–99)
Potassium: 3.8 mmol/L (ref 3.5–5.1)
Sodium: 136 mmol/L (ref 135–145)

## 2020-03-05 LAB — PREPARE RBC (CROSSMATCH)

## 2020-03-05 LAB — PROSTATE-SPECIFIC AG, SERUM (LABCORP): Prostate Specific Ag, Serum: 470 ng/mL — ABNORMAL HIGH (ref 0.0–4.0)

## 2020-03-05 MED ORDER — SODIUM CHLORIDE 0.9% FLUSH: 3.0000 mL | INTRAVENOUS | Status: DC | PRN

## 2020-03-05 MED ORDER — HEPARIN SOD (PORK) LOCK FLUSH 100 UNIT/ML IV SOLN
500.0000 [IU] | Freq: Every day | INTRAVENOUS | Status: DC | PRN
  Filled 2020-03-05: qty 5

## 2020-03-05 MED ORDER — ACETAMINOPHEN 325 MG PO TABS
650.0000 mg | ORAL_TABLET | Freq: Once | ORAL | Status: AC
  Administered 2020-03-05: 650 mg via ORAL
  Filled 2020-03-05: qty 2

## 2020-03-05 MED ORDER — SODIUM CHLORIDE 0.45 % IV SOLN: INTRAVENOUS | Status: DC

## 2020-03-05 MED ORDER — HEPARIN SOD (PORK) LOCK FLUSH 100 UNIT/ML IV SOLN: 250.0000 [IU] | INTRAVENOUS | Status: DC | PRN

## 2020-03-05 MED ORDER — SODIUM CHLORIDE 0.9% FLUSH: 10.0000 mL | INTRAVENOUS | Status: DC | PRN

## 2020-03-05 MED ORDER — METHYLPREDNISOLONE SODIUM SUCC 40 MG IJ SOLR
40.0000 mg | Freq: Once | INTRAMUSCULAR | Status: AC
  Administered 2020-03-05: 40 mg via INTRAVENOUS
  Filled 2020-03-05: qty 1

## 2020-03-05 MED ORDER — SODIUM CHLORIDE 0.9% IV SOLUTION
250.0000 mL | Freq: Once | INTRAVENOUS | Status: AC
  Administered 2020-03-05: 250 mL via INTRAVENOUS

## 2020-03-05 NOTE — Plan of Care (Signed)

## 2020-03-05 NOTE — TOC Initial Note (Signed)
Transition of Care [facility]) - Initial/Assessment Note    Patient Details  Name: ABDULAZIZ [name] MRN: [id number] Date of Birth: 02/18/1931  Transition of Care [facility]) CM/SW Contact:    Trish Mage, LCSW Phone Number: 03/05/2020, 2:09 PM  Clinical Narrative:   CSW responding to MD consult re: [place] needs in the home.  After researching chart, found that patient discharged from hospital 4 weeks ago and was referred to [facility] for [facility] services.  Amy with [facility] confirms that he is receiving HH PT, OT, RN and aide services.  Will need resumption orders at d/c. TOC will continue to follow during the course of hospitalization.                 Expected Discharge Plan: [place] Barriers to Discharge: No Barriers Identified   Patient Goals and CMS Choice        Expected Discharge Plan and Services Expected Discharge Plan: [place]   Discharge Planning Services: CM Consult Post Acute Care Choice: [place] arrangements for the past 2 months: Single Family Home                                      Prior Living Arrangements/Services Living arrangements for the past 2 months: Single Family Home Lives with:: Adult Children              Current home services: Home PT, Home RN, Home OT, Homehealth aide    Activities of Daily Living Home Assistive Devices/Equipment: Dentures (specify type), Environmental consultant (specify type), Wheelchair, Bedside commode/3-in-1, Shower chair with back (full set dentures, urinal, shower chair has a transfer bench) ADL Screening (condition at time of admission) Patient's cognitive ability adequate to safely complete daily activities?: No Is the patient deaf or have difficulty hearing?: No Does the patient have difficulty seeing, even when wearing glasses/contacts?: No Does the patient have difficulty concentrating, remembering, or making decisions?: Yes (confusion) Patient able to express need for assistance with  ADLs?: Yes Does the patient have difficulty dressing or bathing?: Yes Independently performs ADLs?: No Communication: Independent Dressing (OT): Needs assistance Is this a change from baseline?: Pre-admission baseline Grooming: Independent Feeding: Independent Bathing: Needs assistance Is this a change from baseline?: Pre-admission baseline Toileting: Needs assistance Is this a change from baseline?: Pre-admission baseline In/Out Bed: Needs assistance Is this a change from baseline?: Pre-admission baseline Walks in Home: Needs assistance Is this a change from baseline?: Pre-admission baseline Does the patient have difficulty walking or climbing stairs?: Yes Weakness of Legs: Both Weakness of Arms/Hands: Both  Permission Sought/Granted                  Emotional Assessment              Admission diagnosis:  ARF (acute renal failure) ([facility]) [N17.9] AKI (acute kidney injury) ([place]) [N17.9] Patient Active Problem List   Diagnosis Date Noted  . DNR (do not resuscitate)   . HFrEF (heart failure with reduced ejection fraction) ([place]) 01/26/2020  . Bilateral leg edema 01/26/2020  . Osteoarthritis of knee 07/16/2019  . Prostate cancer metastatic to bone ([place]) 08/07/2017  . Macrocytic anemia 07/20/2017  . ARF (acute renal failure) ([place]) 07/20/2017  . Thrombocytopenia ([place]) 07/20/2017  . History of complete AV block 10/30/2016  . Chronic kidney disease, stage 3b ([place]) 10/30/2016  . Essential hypertension   . Hyperlipidemia   .  BPH associated with nocturia   . Coronary artery disease involving native coronary artery of native heart without angina pectoris   . History of stroke   . Gout    PCP:  [name], MD Pharmacy:   [facility] [place], [place] - [address] AT [address] [place] [address] Phone: [phone] Fax: [place], [place] - [address] [place] [address] Phone: [phone] Fax: [phone]     Social Determinants of Health (SDOH) Interventions    Readmission Risk Interventions No flowsheet data found.

## 2020-03-05 NOTE — Progress Notes (Signed)
Tony [name]  FHL:[id number] DOB: December 17, 1930 DOA: 03/04/2020 PCP: [name], MD    Brief Narrative:  86 year old with a history of metastatic prostate cancer (mets to bone), recently diagnosed chronic systolic CHF, severe osteoarthritis on chronic narcotics, CKD stage IIIb, and CAD who presented to the ED with 2 to 3 days of severe progressive weakness.  He presented to his oncologist office where he was found to be suffering with acute renal failure.  He was transferred to the ED where he was also found to be anemic.  Significant Events:  11/4 admit via ED  Antimicrobials:  None  DVT prophylaxis: Lovenox  Subjective: Afebrile.  Blood pressure borderline low but stable.  The patient states he is feeling fine overall, but he is mildly confused.  He is pleasant and not agitated.  He denies chest pain nausea vomiting or abdominal pain.  Assessment & Plan:  Acute renal failure on CKD stage IIIb No obstruction on renal ultrasound -likely due to poor oral intake plus Lasix -continue to gently hydrate -creatinine trending downward at this time - baseline creatinine appears to be approximately 2.0 as of last month  Altered mental status - acute delirium Possibly due to [facility] and ARF - rule out other reversible metabolic issues - ?CNS metastatic disease - Ca2+ is not elevated   Macrocytic anemia Hemoglobin 7.5 at presentation -baseline 8.6 approximately 10 days ago -transfused 1 unit PRBC -no evidence of active bleeding  Metastatic prostate cancer Under care of Dr. [name] -continue prednisone -Zytiga stopped as it was felt to be contributing to his CHF -Oncology following  Recently diagnosed chronic systolic CHF EF 37-34% -holding diuretic -continue Coreg, BiDil -no significant edema presently  Severe osteoarthritis/chronic pain Continue fentanyl patch and as needed oxycodone  Generalized weakness/physical deconditioning Uses a walker at baseline -PT/OT to evaluate   Code Status:  NO CODE BLUE Family Communication:  Status is: Inpatient  Remains inpatient appropriate because:Inpatient level of care appropriate due to severity of illness   Dispo: The patient is from: Home              Anticipated d/c is to: to be determined              Anticipated d/c date is: 2 days              Patient currently is not medically stable to d/c.   Consultants:  Oncology  Objective: Blood pressure 112/76, pulse 84, temperature 98.2 F (36.8 C), resp. rate 18, height 5\' 7"  (1.702 m), weight 78.2 kg, SpO2 97 %.  Intake/Output Summary (Last 24 hours) at 03/05/2020 0958 Last data filed at 03/05/2020 0405 Gross per 24 hour  Intake 1580 ml  Output 225 ml  Net 1355 ml   Filed Weights   03/04/20 2009  Weight: 78.2 kg    Examination: General: No acute respiratory distress Lungs: Clear to auscultation bilaterally without wheezes or crackles Cardiovascular: Regular rate and rhythm without murmur gallop or rub normal S1 and S2 Abdomen: Nontender, nondistended, soft, bowel sounds positive, no rebound, no ascites, no appreciable mass Extremities: No significant cyanosis, clubbing, or edema bilateral lower extremities  CBC: Recent Labs  Lab 03/04/20 1404 03/05/20 0621  WBC 6.3 6.5  NEUTROABS 4.5  --   HGB 7.5* 8.2*  HCT 25.4* 26.4*  MCV 102.8* 101.9*  PLT 222 287   Basic Metabolic Panel: Recent Labs  Lab 03/04/20 1404 03/05/20 0621  NA 140 136  K 4.3 3.8  CL  100 97*  CO2 30 25  GLUCOSE 165* 102*  BUN 42* 36*  CREATININE 3.89* 3.51*  CALCIUM 9.0 8.2*   GFR: Estimated Creatinine Clearance: 13.9 mL/min (A) (by C-G formula based on SCr of 3.51 mg/dL (H)).  Liver Function Tests: Recent Labs  Lab 03/04/20 1404  AST 15  ALT 7  ALKPHOS 228*  BILITOT 0.5  PROT 6.3*  ALBUMIN 2.8*   No results for input(s): LIPASE, AMYLASE in the last 168 hours. No results for input(s): AMMONIA in the last 168 hours.  Coagulation Profile: No results for input(s): INR,  PROTIME in the last 168 hours.  Cardiac Enzymes: No results for input(s): CKTOTAL, CKMB, CKMBINDEX, TROPONINI in the last 168 hours.  HbA1C: Hgb A1c MFr Bld  Date/Time Value Ref Range Status  07/20/2017 09:03 PM 4.5 (L) 4.8 - 5.6 % Final    Comment:    (NOTE) Pre diabetes:          5.7%-6.4% Diabetes:              >6.4% Glycemic control for   <7.0% adults with diabetes      Recent Results (from the past 240 hour(s))  Respiratory Panel by RT PCR (Flu A&B, Covid) - Nasopharyngeal Swab     Status: None   Collection Time: 03/04/20  4:57 PM   Specimen: Nasopharyngeal Swab  Result Value Ref Range Status   SARS Coronavirus 2 by RT PCR NEGATIVE NEGATIVE Final    Comment: (NOTE) SARS-CoV-2 target nucleic acids are NOT DETECTED.  The SARS-CoV-2 RNA is generally detectable in upper respiratoy specimens during the acute phase of infection. The lowest concentration of SARS-CoV-2 viral copies this assay can detect is 131 copies/mL. A negative result does not preclude SARS-Cov-2 infection and should not be used as the sole basis for treatment or other patient management decisions. A negative result may occur with  improper specimen collection/handling, submission of specimen other than nasopharyngeal swab, presence of viral mutation(s) within the areas targeted by this assay, and inadequate number of viral copies (<131 copies/mL). A negative result must be combined with clinical observations, patient history, and epidemiological information. The expected result is Negative.  Fact Sheet for Patients:  PinkCheek.be  Fact Sheet for Healthcare Providers:  GravelBags.it  This test is no t yet approved or cleared by the [place] FDA and  has been authorized for detection and/or diagnosis of SARS-CoV-2 by FDA under an Emergency Use Authorization (EUA). This EUA will remain  in effect (meaning this test can be used) for the  duration of the COVID-19 declaration under Section 564(b)(1) of the Act, 21 U.S.C. section 360bbb-3(b)(1), unless the authorization is terminated or revoked sooner.     Influenza A by PCR NEGATIVE NEGATIVE Final   Influenza B by PCR NEGATIVE NEGATIVE Final    Comment: (NOTE) The Xpert Xpress SARS-CoV-2/FLU/RSV assay is intended as an aid in  the diagnosis of influenza from Nasopharyngeal swab specimens and  should not be used as a sole basis for treatment. Nasal washings and  aspirates are unacceptable for Xpert Xpress SARS-CoV-2/FLU/RSV  testing.  Fact Sheet for Patients: PinkCheek.be  Fact Sheet for Healthcare Providers: GravelBags.it  This test is not yet approved or cleared by the [place] FDA and  has been authorized for detection and/or diagnosis of SARS-CoV-2 by  FDA under an Emergency Use Authorization (EUA). This EUA will remain  in effect (meaning this test can be used) for the duration of the  Covid-19 declaration under Section  564(b)(1) of the Act, 21  U.S.C. section 360bbb-3(b)(1), unless the authorization is  terminated or revoked. Performed at [facility], [place] [address], [place], [place] [address]      Scheduled Meds: . aspirin EC  81 mg Oral Daily  . carvedilol  12.5 mg Oral BID WC  . feeding supplement (NEPRO CARB STEADY)  237 mL Oral TID BM  . [START ON 03/07/2020] fentaNYL  1 patch Transdermal Q72H  . heparin  5,000 Units Subcutaneous Q8H  . isosorbide-hydrALAZINE  1 tablet Oral BID  . polyethylene glycol  17 g Oral Daily  . predniSONE  5 mg Oral Q breakfast  . tamsulosin  0.4 mg Oral BID  . vitamin B-12  1,000 mcg Oral Daily     LOS: 0 days   Cherene Altes, MD [facility] Office  [phone] Pager - Text Page per Shea Evans  If 7PM-7AM, please contact night-coverage per Amion 03/05/2020, 9:58 AM

## 2020-03-05 NOTE — Evaluation (Signed)
Physical Therapy Evaluation Patient Details Name: Tony [name] MRN: [id number] DOB: 1930/12/10 Today's Date: 03/05/2020   History of Present Illness  ARISTEO HANKERSON is a 86 y.o. male with a past medical history of metastatic prostate cancer with mets to the bones, chronic systolic CHF, severe osteoarthritis on narcotics, chronic kidney disease stage IIIb, coronary artery disease who was hospitalized about a month or so ago for congestive heart failure.  This was newly diagnosed.  Patient was seen by cardiology.  Patient was on Zytiga for his prostate cancer which was thought to be the reason for his CHF. Comes to Hospital with progressive weakness. He was found to have acute renal failure with a significant rise in creatinine  Clinical Impression  The patient talkative, not wanting to get OOB but did concede and mobilized to sitting on bed edge with much encouragement. Patient requiring Mod assist for bed mobility, Min assist for ambulating x 50' using Rw. Patient from home with daughter assisting , per patient, and has an Aide, ? Hours  Available. Patient states: " MY daughter takes care of everything".   Pt admitted with above diagnosis. Pt currently with functional limitations due to the deficits listed below (see PT Problem List). Pt will benefit from skilled PT to increase their independence and safety with mobility to allow discharge to the venue listed below.       Follow Up Recommendations Home health PT;Supervision/Assistance - 24 hour    Equipment Recommendations  None recommended by PT    Recommendations for Other Services       Precautions / Restrictions Precautions Precautions: Fall Restrictions Weight Bearing Restrictions: No      Mobility  Bed Mobility Overal bed mobility: Needs Assistance Bed Mobility: Supine to Sit     Supine to sit: HOB elevated;Mod assist     General bed mobility comments: mod A to elevate trunk     Transfers Overall transfer level: Needs  assistance Equipment used: Rolling walker (2 wheeled) Transfers: Sit to/from Stand Sit to Stand: Min assist         General transfer comment: min A for steadying  Ambulation/Gait Ambulation/Gait assistance: Min assist Gait Distance (Feet): 50 Feet Assistive device: Rolling walker (2 wheeled) Gait Pattern/deviations: Step-to pattern;Step-through pattern;Shuffle Gait velocity: decr   General Gait Details: slow speed, patient abble to turn around  using RW  Stairs            Wheelchair Mobility    Modified Rankin (Stroke Patients Only)       Balance Overall balance assessment: Needs assistance Sitting-balance support: Feet supported Sitting balance-Leahy Scale: Fair     Standing balance support: Bilateral upper extremity supported;During functional activity Standing balance-Leahy Scale: Poor Standing balance comment: reliant on UE and RW                             Pertinent Vitals/Pain Pain Assessment: No/denies pain Faces Pain Scale: No hurt    Home Living Family/patient expects to be discharged to:: Private residence Living Arrangements: Children (daughter) Available Help at Discharge: Family;Personal care attendant Type of Home: House Home Access: Level entry     Home Layout: One level Home Equipment: Environmental consultant - 4 wheels;Wheelchair - manual Additional Comments: difficulty obtaining history from patient, states "my daughter answers all of that"    Prior Function Level of Independence: Needs assistance         Comments: uses rollator, patient stated he was too  weak to ambulate and was using a WC? If info reliable     Hand Dominance   Dominant Hand:  (did not specify)    Extremity/Trunk Assessment   Upper Extremity Assessment Upper Extremity Assessment: Defer to OT evaluation    Lower Extremity Assessment Lower Extremity Assessment: Generalized weakness    Cervical / Trunk Assessment Cervical / Trunk Assessment: Normal   Communication   Communication: No difficulties  Cognition Arousal/Alertness: Awake/alert Behavior During Therapy: WFL for tasks assessed/performed Overall Cognitive Status: Impaired/Different from baseline Area of Impairment: Orientation;Attention;Memory;Following commands;Safety/judgement;Awareness;Problem solving                 Orientation Level: Time;Situation Current Attention Level: Sustained Memory: Decreased short-term memory Following Commands: Follows one step commands consistently   Awareness: Emergent Problem Solving: Difficulty sequencing;Slow processing General Comments: patient  requires redirection and repetition of why therpies are to wprok with him. States he was not going to walk, but he did.      General Comments General comments (skin integrity, edema, etc.): BP 128/67 semi-supine, BP 125/65 EOB denied any dizziness    Exercises     Assessment/Plan    PT Assessment Patient needs continued PT services  PT Problem List Decreased strength;Decreased knowledge of use of DME;Decreased activity tolerance;Decreased safety awareness;Decreased knowledge of precautions;Decreased mobility;Decreased cognition       PT Treatment Interventions DME instruction;Gait training;Functional mobility training;Therapeutic activities;Therapeutic exercise;Patient/family education    PT Goals (Current goals can be found in the Care Plan section)  Acute Rehab PT Goals Patient Stated Goal: to rest PT Goal Formulation: With patient Time For Goal Achievement: 03/19/20 Potential to Achieve Goals: Good    Frequency Min 3X/week   Barriers to discharge        Co-evaluation               AM-PAC PT "6 Clicks" Mobility  Outcome Measure Help needed turning from your back to your side while in a flat bed without using bedrails?: A Lot Help needed moving from lying on your back to sitting on the side of a flat bed without using bedrails?: A Lot Help needed moving to and  from a bed to a chair (including a wheelchair)?: A Lot Help needed standing up from a chair using your arms (e.g., wheelchair or bedside chair)?: A Lot Help needed to walk in hospital room?: A Lot Help needed climbing 3-5 steps with a railing? : A Lot 6 Click Score: 12    End of Session Equipment Utilized During Treatment: Gait belt Activity Tolerance: Patient tolerated treatment well Patient left: in chair;with call bell/phone within reach;with chair alarm set Nurse Communication: Mobility status PT Visit Diagnosis: Unsteadiness on feet (R26.81);Difficulty in walking, not elsewhere classified (R26.2)    Time: 1761-6073 PT Time Calculation (min) (ACUTE ONLY): 28 min   Charges:   PT Evaluation $PT Eval Low Complexity: [place] [facility] Acute Rehabilitation Services Pager [phone] Office [phone]   Claretha Cooper 03/05/2020, 11:55 AM

## 2020-03-05 NOTE — Evaluation (Signed)
Occupational Therapy Evaluation Patient Details Name: Tony [name] MRN: [id number] DOB: 09-03-1930 Today's Date: 03/05/2020    History of Present Illness Tony [name] is a 86 y.o. male with a past medical history of metastatic prostate cancer with mets to the bones, chronic systolic CHF, severe osteoarthritis on narcotics, chronic kidney disease stage IIIb, coronary artery disease who was hospitalized about a month or so ago for congestive heart failure.  This was newly diagnosed.  Patient was seen by cardiology.  Patient was on Zytiga for his prostate cancer which was thought to be the reason for his CHF. Comes to Hospital with progressive weakness. He was found to have acute renal failure with a significant rise in creatinine   Clinical Impression   Patient with functional deficits listed below impacting safety and independence with self care. Patient is questionable historian, tangential with difficulty elaborating on details of PLOF such as how many hrs caregivers are present during the day. Currently patient requiring mod A for bed mobility, min A for functional transfers + ambulation with walker and min A for UB/LB ADLs impacted by decreased activity tolerance and balance. Recommend continued acute OT services to D/C to venue listed below.    Follow Up Recommendations  Home health OT;Supervision/Assistance - 24 hour (HH if 24/7 supervision available)    Equipment Recommendations  None recommended by OT       Precautions / Restrictions Precautions Precautions: Fall Restrictions Weight Bearing Restrictions: No      Mobility Bed Mobility Overal bed mobility: Needs Assistance Bed Mobility: Supine to Sit     Supine to sit: HOB elevated;Mod assist     General bed mobility comments: mod A to elevate trunk     Transfers Overall transfer level: Needs assistance Equipment used: Rolling walker (2 wheeled) Transfers: Sit to/from Stand Sit to Stand: Min assist          General transfer comment: min A for steadying    Balance Overall balance assessment: Needs assistance Sitting-balance support: Feet supported Sitting balance-Leahy Scale: Fair     Standing balance support: Bilateral upper extremity supported Standing balance-Leahy Scale: Poor                             ADL either performed or assessed with clinical judgement   ADL Overall ADL's : Needs assistance/impaired     Grooming: Set up;Sitting   Upper Body Bathing: Supervision/ safety;Sitting   Lower Body Bathing: Minimal assistance;Sitting/lateral leans;Sit to/from stand   Upper Body Dressing : Supervision/safety;Sitting   Lower Body Dressing: Minimal assistance;Sitting/lateral leans;Sit to/from stand   Toilet Transfer: Minimal assistance;Cueing for safety;Ambulation;RW Toilet Transfer Details (indicate cue type and reason): simulated with functional mobility, decreased activity tolerance needing to sit in recliner after ~12 ft Toileting- Clothing Manipulation and Hygiene: Minimal assistance;Sitting/lateral lean;Sit to/from stand       Functional mobility during ADLs: Minimal assistance;Cueing for safety;Rolling walker General ADL Comments: patient requiring increased assistance with self care due to decreased activity tolerance, safety awareness, balance                  Pertinent Vitals/Pain Pain Assessment: No/denies pain Faces Pain Scale: No hurt     Hand Dominance  (did not specify)   Extremity/Trunk Assessment Upper Extremity Assessment Upper Extremity Assessment: Defer to OT evaluation   Lower Extremity Assessment Lower Extremity Assessment: Generalized weakness   Cervical / Trunk Assessment Cervical / Trunk Assessment: Normal   Communication  Communication Communication: No difficulties   Cognition Arousal/Alertness: Awake/alert Behavior During Therapy: WFL for tasks assessed/performed Overall Cognitive Status: Impaired/Different from  baseline Area of Impairment: Orientation;Attention;Memory;Following commands;Safety/judgement;Awareness;Problem solving                 Orientation Level: Time;Situation Current Attention Level: Sustained Memory: Decreased short-term memory Following Commands: Follows one step commands consistently   Awareness: Emergent Problem Solving: Difficulty sequencing;Slow processing General Comments: patient  requires redirection and repetition of why therpies are to wprok with him. States he was not going to walk, but he did.   General Comments  BP 128/67 semi-supine, BP 125/65 EOB denied any dizziness            Home Living Family/patient expects to be discharged to:: Private residence Living Arrangements: Children (daughter) Available Help at Discharge: Family;Personal care attendant Type of Home: House Home Access: Level entry     Home Layout: One level               Home Equipment: Environmental consultant - 4 wheels;Wheelchair - manual   Additional Comments: difficulty obtaining history from patient, states "my daughter answers all of that"      Prior Functioning/Environment Level of Independence: Needs assistance        Comments: uses rollator, patient stated he was too weak to ambulate and was using a WC? If info reliable        OT Problem List: Decreased strength;Decreased activity tolerance;Impaired balance (sitting and/or standing);Decreased safety awareness      OT Treatment/Interventions: Self-care/ADL training;Therapeutic exercise;DME and/or AE instruction;Energy conservation;Therapeutic activities;Patient/family education;Balance training    OT Goals(Current goals can be found in the care plan section) Acute Rehab OT Goals Patient Stated Goal: to rest OT Goal Formulation: With patient Time For Goal Achievement: 03/19/20 Potential to Achieve Goals: Good  OT Frequency: Min 2X/week    AM-PAC OT "6 Clicks" Daily Activity     Outcome Measure Help from another  person eating meals?: A Little Help from another person taking care of personal grooming?: A Little Help from another person toileting, which includes using toliet, bedpan, or urinal?: A Little Help from another person bathing (including washing, rinsing, drying)?: A Little Help from another person to put on and taking off regular upper body clothing?: A Little Help from another person to put on and taking off regular lower body clothing?: A Little 6 Click Score: 18   End of Session Equipment Utilized During Treatment: Rolling walker;Gait belt Nurse Communication: Mobility status;Other (comment) (IV)  Activity Tolerance: Patient tolerated treatment well Patient left: in chair;with call bell/phone within reach;with chair alarm set  OT Visit Diagnosis: Other abnormalities of gait and mobility (R26.89);Muscle weakness (generalized) (M62.81)                Time: 2952-8413 OT Time Calculation (min): 24 min Charges:  OT General Charges $OT Visit: 1 Visit OT Evaluation $OT Eval Low Complexity: [place] OT OT pager: [place] 03/05/2020, 11:40 AM

## 2020-03-05 NOTE — Progress Notes (Addendum)
HEMATOLOGY-ONCOLOGY PROGRESS NOTE  SUBJECTIVE: The patient reports that he feels weak overall.  He is not complaining of any headaches, dizziness, bone pain, chest pain, shortness of breath.  Denies bleeding.  He reports a poor appetite and has not been eating and drinking as well as he usually does at home.  Denies abdominal pain, nausea, vomiting.  His daughter is at the bedside and has noticed some mild memory lapses at times.  REVIEW OF SYSTEMS:   Constitutional: Denies fevers, chills, reports fatigue and weakness Eyes: Denies blurriness of vision Ears, nose, mouth, throat, and face: Denies mucositis or sore throat Respiratory: Denies cough, dyspnea or wheezes Cardiovascular: Denies palpitation, chest discomfort Gastrointestinal:  Denies nausea, heartburn or change in bowel habits Skin: Denies abnormal skin rashes Lymphatics: Denies new lymphadenopathy or easy bruising Neurological:Denies numbness, tingling or new weaknesses Behavioral/Psych: Mood is stable, no new changes  Extremities: No lower extremity edema All other systems were reviewed with the patient and are negative.  I have reviewed the past medical history, past surgical history, social history and family history with the patient and they are unchanged from previous note.   PHYSICAL EXAMINATION: ECOG PERFORMANCE STATUS: 2 - Symptomatic, <50% confined to bed  Vitals:   03/05/20 0405 03/05/20 0739  BP: (!) 105/53 112/76  Pulse: 74 84  Resp: 18 18  Temp: 98.5 F (36.9 C) 98.2 F (36.8 C)  SpO2: 95% 97%   Filed Weights   03/04/20 2009  Weight: 78.2 kg    Intake/Output from previous day: 11/04 0701 - 11/05 0700 In: 1580 [I.V.:25; Blood:315; NG/GT:240; IV Piggyback:1000] Out: 225 [Urine:225]  GENERAL:alert, no distress and comfortable SKIN: skin color, texture, turgor are normal, no rashes or significant lesions EYES: normal, Conjunctiva are pink and non-injected, sclera clear OROPHARYNX:no exudate, no  erythema and lips, buccal mucosa, and tongue normal  LUNGS: clear to auscultation and percussion with normal breathing effort HEART: regular rate & rhythm and no murmurs and no lower extremity edema ABDOMEN:abdomen soft, non-tender and normal bowel sounds Musculoskeletal:no cyanosis of digits and no clubbing  NEURO: alert & oriented x 3 with fluent speech, no focal motor/sensory deficits  LABORATORY DATA:  I have reviewed the data as listed CMP Latest Ref Rng & Units 03/05/2020 03/04/2020 02/20/2020  Glucose 70 - 99 mg/dL 102(H) 165(H) 118(H)  BUN 8 - 23 mg/dL 36(H) 42(H) 33(H)  Creatinine 0.61 - 1.24 mg/dL 3.51(H) 3.89(HH) 2.73(H)  Sodium 135 - 145 mmol/L 136 140 142  Potassium 3.5 - 5.1 mmol/L 3.8 4.3 4.3  Chloride 98 - 111 mmol/L 97(L) 100 99  CO2 22 - 32 mmol/L 25 30 28   Calcium 8.9 - 10.3 mg/dL 8.2(L) 9.0 8.4(L)  Total Protein 6.5 - 8.1 g/dL - 6.3(L) -  Total Bilirubin 0.3 - 1.2 mg/dL - 0.5 -  Alkaline Phos 38 - 126 U/L - 228(H) -  AST 15 - 41 U/L - 15 -  ALT 0 - 44 U/L - 7 -    Lab Results  Component Value Date   WBC 6.5 03/05/2020   HGB 8.2 (L) 03/05/2020   HCT 26.4 (L) 03/05/2020   MCV 101.9 (H) 03/05/2020   PLT 184 03/05/2020   NEUTROABS 4.5 03/04/2020    US Renal  Result Date: 03/04/2020 CLINICAL DATA:  Renal failure EXAM: RENAL / URINARY TRACT ULTRASOUND COMPLETE COMPARISON:  Head CT 03/04/2020 FINDINGS: Right Kidney: Renal measurements: 9.6 x 4.2 x 4.4 cm = volume: 92 mL. Echogenicity is increased. No mass or hydronephrosis visualized. Left  Kidney: Renal measurements: 8.8 x 4.3 x 3.9 cm = volume: 77 mL. Echogenicity is increased. No mass or hydronephrosis visualized. Bladder: Appears normal for degree of bladder distention. Other: None. IMPRESSION: Increased echogenicity of the renal parenchyma suggestive of renal disease. Electronically Signed   By: Iven Finn M.D.   On: 03/04/2020 18:16   NM PET (AXUMIN) SKULL BASE TO MID THIGH  Result Date:  03/04/2020 CLINICAL DATA:  Prostate carcinoma with biochemical recurrence. Increasing PSA. Prostate cancer with bone metastasis. Hormonal therapy. PSA equal 46 EXAM: NUCLEAR MEDICINE PET SKULL BASE TO THIGH TECHNIQUE: 8.6 mCi F-18 Fluciclovine was injected intravenously. Full-ring PET imaging was performed from the skull base to thigh after the radiotracer. CT data was obtained and used for attenuation correction and anatomic localization. COMPARISON:  Fluciclovine PET-CT scan 10/16/2019 FINDINGS: NECK No radiotracer activity in neck lymph nodes. Incidental CT finding: None CHEST No radiotracer accumulation within mediastinal or hilar lymph nodes. No suspicious pulmonary nodules on the CT scan. Incidental CT finding: None ABDOMEN/PELVIS Prostate: Diffuse activity in the prostate gland similar comparison exam with SUV max equal 4.4 compared SUV max equal 4.7. Lymph nodes: No abnormal radiotracer accumulation within pelvic or abdominal nodes. Liver: No evidence of liver metastasis Incidental CT finding: Sigmoid diverticulosis. SKELETON Again demonstrated diffuse sclerotic skeletal metastasis throughout the axillary and appendicular skeleton. Internal fixation of the RIGHT femur. On the background of diffuse sclerotic skeletal metastasis, there are several foci of discrete radiotracer activity not changed from prior. For example lesion in the intertrochanteric region of the proximal left femur SUV max equal 5.5 compared SUV max equal 5.7. Lesion in the mid thoracic spine with SUV max equal 3.7 compared SUV max equal 3.9. No evidence of progressive skeletal metastasis by Fluciclovine PET imaging. IMPRESSION: 1. No evidence prostate cancer progression by the Fluciclovine PET CT imaging. Consider Pylarify (F18 PSMA) PET imaging (available at [facility]) for potential increased specificity/sensitivity. 2. Widespread diffuse sclerotic skeletal metastasis the majority of which is not radiotracer avid. Several foci discrete  radiotracer avidity within the spine and pelvis are similar to comparison exam 3. No evidence of nodal metastasis or visceral metastasis. Electronically Signed   By: Suzy Bouchard M.D.   On: 03/04/2020 16:22    ASSESSMENT: 86 y.o. male with  1. Normocytic Anemia - due to primarily metastatic prostatic cancer (ACD + BM involvement with prostate cancer causing myelopthisic picture)  -Pt presented with a Hgb at 8.7 on 07/22/17, improved to 10.3 after receiving PRBC transfusion. -In review of the patient's previous CBC records, his anemia developed 6-7 months before presenting to care with me, unaccompanied by a drop in his EPO (07/20/17 elevated at 249.8);  LDH elevated  but haptoglobin at 357 on 07/20/17. -suggested against active hemolysis.  2. Metastatic prostate cancer with extensive bone metastases. With bone mets/BM Mets and LNadenopathy. Exam positive for left retroperitoneal, bilateral pelvis and right inguinal adenopathy. Exam positive for left retroperitoneal, bilateral pelvis and right inguinal adenopathy. Diffuse sclerotic bone metastasis.  3. Elevated alkaline phosphatase due to bone metastases from prostate cancer  4. Cancer related pain - primarily in b/l thighs.  5.  Acute on chronic kidney disease  PLAN:  -Discussed lab work with the patient and his daughter today.  Hemoglobin is 8.2 following PRBC transfusion last evening.  His BUN and creatinine remain elevated although slightly improved since admission.  Baseline creatinine is 2-2.2 in recent months. -Recommend PRBC transfusion to keep hemoglobin above 8. -Worsening renal function likely due to  furosemide usage and poor oral intake.  Continue to hold furosemide and encourage p.o. intake and IV fluids.  If renal function is not improving, recommend nephrology consult. -Discussed PET scan and PSA results.  PET scan appears to show stable disease and PSA has been slowly rising.  Guardant 360 testing was obtained on 11/4  and will take 1 to 2 weeks to result.  We do not plan to restart Zytiga as a cause fluid retention and he continued to have disease progression while on this medication.  Additionally, he has been through several lines of hormonal therapy which suggests that further hormonal therapy would not be beneficial.  Further treatment recommendations regarding his prostate cancer pending all of these results.   LOS: 0 days   Mikey Bussing, DNP, AGPCNP-BC, AOCNP 03/05/20   ADDENDUM  .Patient was Personally and independently interviewed, examined and relevant elements of the history of present illness were reviewed in details and an assessment and plan was created. All elements of the patient's history of present illness , assessment and plan were discussed in details with Mikey Bussing, DNP, AGPCNP-BC, AOCNP. The above documentation reflects our combined findings assessment and plan. ordered additional 1 unit of PRBC transfusion for symptomatic myelopthesic anemia. Further rx recommendation pending guardant 360 results.  Sullivan Lone MD MS

## 2020-03-06 DIAGNOSIS — D649 Anemia, unspecified: Secondary | ICD-10-CM

## 2020-03-06 LAB — CBC
HCT: 33 % — ABNORMAL LOW (ref 39.0–52.0)
Hemoglobin: 10.3 g/dL — ABNORMAL LOW (ref 13.0–17.0)
MCH: 30.6 pg (ref 26.0–34.0)
MCHC: 31.2 g/dL (ref 30.0–36.0)
MCV: 97.9 fL (ref 80.0–100.0)
Platelets: 213 10*3/uL (ref 150–400)
RBC: 3.37 MIL/uL — ABNORMAL LOW (ref 4.22–5.81)
RDW: 19.7 % — ABNORMAL HIGH (ref 11.5–15.5)
WBC: 7.9 10*3/uL (ref 4.0–10.5)
nRBC: 0.6 % — ABNORMAL HIGH (ref 0.0–0.2)

## 2020-03-06 LAB — TYPE AND SCREEN
ABO/RH(D): O POS
Antibody Screen: NEGATIVE
Unit division: 0
Unit division: 0

## 2020-03-06 LAB — COMPREHENSIVE METABOLIC PANEL
ALT: 21 U/L (ref 0–44)
AST: 18 U/L (ref 15–41)
Albumin: 3 g/dL — ABNORMAL LOW (ref 3.5–5.0)
Alkaline Phosphatase: 210 U/L — ABNORMAL HIGH (ref 38–126)
Anion gap: 9 (ref 5–15)
BUN: 43 mg/dL — ABNORMAL HIGH (ref 8–23)
CO2: 28 mmol/L (ref 22–32)
Calcium: 8.2 mg/dL — ABNORMAL LOW (ref 8.9–10.3)
Chloride: 102 mmol/L (ref 98–111)
Creatinine, Ser: 3.11 mg/dL — ABNORMAL HIGH (ref 0.61–1.24)
GFR, Estimated: 19 mL/min — ABNORMAL LOW (ref >60)
Glucose, Bld: 163 mg/dL — ABNORMAL HIGH (ref 70–99)
Potassium: 4.6 mmol/L (ref 3.5–5.1)
Sodium: 139 mmol/L (ref 135–145)
Total Bilirubin: 0.6 mg/dL (ref 0.3–1.2)
Total Protein: 6.2 g/dL — ABNORMAL LOW (ref 6.5–8.1)

## 2020-03-06 LAB — TSH: TSH: 1.309 u[IU]/mL (ref 0.350–4.500)

## 2020-03-06 LAB — BPAM RBC
Blood Product Expiration Date: 202112042359
Blood Product Expiration Date: 202112062359
ISSUE DATE / TIME: 202111050019
ISSUE DATE / TIME: 202111052313
Unit Type and Rh: 5100
Unit Type and Rh: 5100

## 2020-03-06 LAB — AMMONIA: Ammonia: 37 umol/L — ABNORMAL HIGH (ref 9–35)

## 2020-03-06 MED ORDER — ADULT MULTIVITAMIN W/MINERALS CH
1.0000 | ORAL_TABLET | Freq: Every day | ORAL | Status: DC
  Administered 2020-03-07 – 2020-03-12 (×6): 1 via ORAL
  Filled 2020-03-06 (×6): qty 1

## 2020-03-06 NOTE — Progress Notes (Signed)
Pt informed at shift change that he is to receive one unit of blood. Indication and education provided to patient. Consent checked. Pre transfusion medications given. Blood administered per policy. Pt currently infusing with no adverse reactions. Will continue to monitor and complete transfusion per policy.

## 2020-03-06 NOTE — Progress Notes (Signed)
Initial Nutrition Assessment  DOCUMENTATION CODES:   Not applicable  INTERVENTION:   Nepro Shake po TID, each supplement provides 425 kcal and 19 grams protein  MVI daily   NUTRITION DIAGNOSIS:   Increased nutrient needs related to cancer and cancer related treatments as evidenced by increased estimated needs.  GOAL:   Patient will meet greater than or equal to 90% of their needs  MONITOR:   PO intake, Supplement acceptance, Labs, Weight trends, Skin, I & O's  REASON FOR ASSESSMENT:   Malnutrition Screening Tool    ASSESSMENT:   86 y/o male with a history of metastatic prostate cancer (mets to bone), recently diagnosed systolic CHF, severe osteoarthritis on chronic narcotics, CKD stage IIIb and CAD who is admittd with severe progressive weakness, acute renal failure and anemia  RD working remotely.  Pt reports poor appetite and oral intake pta. Pt has continued to have decreased appetite and oral intake in hospital; pt ate 50% of his breakfast, 100% of his lunch but refused dinner today. Pt is drinking his Nepro supplements. Recommend continue supplements. RD will add daily MVI. Per chart, pt down 9lbs(5%) over the past 6 months; this is not significant.   Medications reviewed and include: aspirin, heparin, miralax, prednisone, B12, NaCl $RemoveB'@50ml'xVSZZmHR$ /hr  Labs reviewed: BUN 43(H), creat 3.11(H), alk phos 210(H), ammonia 37(H)  Hgb 10.3(L), Hct 33.0(L)  NUTRITION - FOCUSED PHYSICAL EXAM: Unable to perform at this time   Diet Order:   Diet Order            Diet regular Room service appropriate? Yes; Fluid consistency: Thin  Diet effective now                EDUCATION NEEDS:   No education needs have been identified at this time  Skin:  Skin Assessment: Reviewed RN Assessment  Last BM:  11/3  Height:   Ht Readings from Last 1 Encounters:  03/04/20 $RemoveB'5\' 7"'zYkrlrqL$  (1.702 m)    Weight:   Wt Readings from Last 1 Encounters:  03/04/20 78.2 kg    Ideal Body Weight:  67  kg  BMI:  Body mass index is 27 kg/m.  Estimated Nutritional Needs:   Kcal:  1700-2000kcal/ay  Protein:  80-90g/day  Fluid:  >1.7L/day  Koleen Distance MS, RD, LDN Please refer to [facility] for RD and/or RD on-call/weekend/after hours pager

## 2020-03-06 NOTE — Progress Notes (Signed)
Tony [name]  XBJ:[id number] DOB: January 26, 1931 DOA: 03/04/2020 PCP: [name], MD    Brief Narrative:  [phone] with a history of metastatic prostate cancer (mets to bone), recently diagnosed systolic CHF, severe osteoarthritis on chronic narcotics, CKD stage IIIb, and CAD who presented to the ED with 2-3 days of severe progressive weakness.  He presented to his Oncologist's office where he was found to be suffering with acute renal failure.  He was transferred to the ED where he was also found to be anemic.  Significant Events:  11/4 admit via ED  Antimicrobials:  None  DVT prophylaxis: Lovenox  Subjective: Afebrile.  Vital signs stable. Renal function improving.  Alert and pleasant though remains confused today.  Denies chest pain nausea vomiting or abdominal pain.  Assessment & Plan:  Acute renal failure on CKD stage IIIb No obstruction on renal ultrasound - likely due to poor oral intake plus Lasix -continue to gently hydrate -creatinine continues to trend downward - baseline creatinine appears to be approximately 2.0 as of last month  Altered mental status - acute delirium Possibly due to [facility] and ARF - TSH normal -  Ammonia not signif elevated - RPR pending - B12 and folate not low Sept 2021 - ?CNS metastatic disease - Ca2+ is not elevated   Macrocytic anemia Hemoglobin 7.5 at presentation -baseline 8.6 approximately 10 days ago -transfused 2 unit PRBC - no evidence of active bleeding  Recent Labs  Lab 03/04/20 1404 03/05/20 0621 03/06/20 1023  HGB 7.5* 8.2* 10.3*    Metastatic prostate cancer Under care of Dr. [name] -continue prednisone -Zytiga stopped as it was felt to be contributing to his CHF -Oncology following  Recently diagnosed chronic systolic CHF EF 57-84% - holding diuretic - continue Coreg, BiDil - no significant edema presently  [facility] Weights   03/04/20 2009  Weight: 78.2 kg     Severe osteoarthritis/chronic pain Continue fentanyl patch and as  needed oxycodone  Generalized weakness/physical deconditioning Uses a walker at baseline - therapy suggests HHPT/OT and 24hr assist   Code Status: NO CODE BLUE Family Communication: no family present at time of exam today Status is: Inpatient  Remains inpatient appropriate because:Inpatient level of care appropriate due to severity of illness   Dispo: The patient is from: Home              Anticipated d/c is to: to be determined              Anticipated d/c date is: 2 days              Patient currently is not medically stable to d/c.   Consultants:  Oncology  Objective: Blood pressure 138/77, pulse 77, temperature 97.7 F (36.5 C), temperature source Oral, resp. rate 17, height 5\' 7"  (1.702 m), weight 78.2 kg, SpO2 98 %.  Intake/Output Summary (Last 24 hours) at 03/06/2020 1643 Last data filed at 03/06/2020 1430 Gross per 24 hour  Intake 1869.56 ml  Output 925 ml  Net 944.56 ml   Filed Weights   03/04/20 2009  Weight: 78.2 kg    Examination: General: No acute respiratory distress Lungs: Clear to auscultation bilaterally - no whezeing  Cardiovascular: RRR - no M  Abdomen: NT/ND, soft, bs+, no mass Extremities: No signif C/C/E B LE   CBC: Recent Labs  Lab 03/04/20 1404 03/05/20 0621 03/06/20 1023  WBC 6.3 6.5 7.9  NEUTROABS 4.5  --   --   HGB 7.5* 8.2* 10.3*  HCT 25.4* 26.4* 33.0*  MCV 102.8* 101.9* 97.9  PLT 222 184 761   Basic Metabolic Panel: Recent Labs  Lab 03/04/20 1404 03/05/20 0621 03/06/20 1023  NA 140 136 139  K 4.3 3.8 4.6  CL 100 97* 102  CO2 30 25 28   GLUCOSE 165* 102* 163*  BUN 42* 36* 43*  CREATININE 3.89* 3.51* 3.11*  CALCIUM 9.0 8.2* 8.2*   GFR: Estimated Creatinine Clearance: 15.6 mL/min (A) (by C-G formula based on SCr of 3.11 mg/dL (H)).  Liver Function Tests: Recent Labs  Lab 03/04/20 1404 03/06/20 1023  AST 15 18  ALT 7 21  ALKPHOS 228* 210*  BILITOT 0.5 0.6  PROT 6.3* 6.2*  ALBUMIN 2.8* 3.0*    Recent Labs    Lab 03/06/20 1023  AMMONIA 37*    HbA1C: Hgb A1c MFr Bld  Date/Time Value Ref Range Status  07/20/2017 09:03 PM 4.5 (L) 4.8 - 5.6 % Final    Comment:    (NOTE) Pre diabetes:          5.7%-6.4% Diabetes:              >6.4% Glycemic control for   <7.0% adults with diabetes      Recent Results (from the past 240 hour(s))  Respiratory Panel by RT PCR (Flu A&B, Covid) - Nasopharyngeal Swab     Status: None   Collection Time: 03/04/20  4:57 PM   Specimen: Nasopharyngeal Swab  Result Value Ref Range Status   SARS Coronavirus 2 by RT PCR NEGATIVE NEGATIVE Final    Comment: (NOTE) SARS-CoV-2 target nucleic acids are NOT DETECTED.  The SARS-CoV-2 RNA is generally detectable in upper respiratoy specimens during the acute phase of infection. The lowest concentration of SARS-CoV-2 viral copies this assay can detect is 131 copies/mL. A negative result does not preclude SARS-Cov-2 infection and should not be used as the sole basis for treatment or other patient management decisions. A negative result may occur with  improper specimen collection/handling, submission of specimen other than nasopharyngeal swab, presence of viral mutation(s) within the areas targeted by this assay, and inadequate number of viral copies (<131 copies/mL). A negative result must be combined with clinical observations, patient history, and epidemiological information. The expected result is Negative.  Fact Sheet for Patients:  PinkCheek.be  Fact Sheet for Healthcare Providers:  GravelBags.it  This test is no t yet approved or cleared by the [place] FDA and  has been authorized for detection and/or diagnosis of SARS-CoV-2 by FDA under an Emergency Use Authorization (EUA). This EUA will remain  in effect (meaning this test can be used) for the duration of the COVID-19 declaration under Section 564(b)(1) of the Act, 21 U.S.C. section  360bbb-3(b)(1), unless the authorization is terminated or revoked sooner.     Influenza A by PCR NEGATIVE NEGATIVE Final   Influenza B by PCR NEGATIVE NEGATIVE Final    Comment: (NOTE) The Xpert Xpress SARS-CoV-2/FLU/RSV assay is intended as an aid in  the diagnosis of influenza from Nasopharyngeal swab specimens and  should not be used as a sole basis for treatment. Nasal washings and  aspirates are unacceptable for Xpert Xpress SARS-CoV-2/FLU/RSV  testing.  Fact Sheet for Patients: PinkCheek.be  Fact Sheet for Healthcare Providers: GravelBags.it  This test is not yet approved or cleared by the [place] FDA and  has been authorized for detection and/or diagnosis of SARS-CoV-2 by  FDA under an Emergency Use Authorization (EUA). This EUA will remain  in  effect (meaning this test can be used) for the duration of the  Covid-19 declaration under Section 564(b)(1) of the Act, 21  U.S.C. section 360bbb-3(b)(1), unless the authorization is  terminated or revoked. Performed at [facility], [place] [address], [place], [place] [address]      Scheduled Meds: . aspirin EC  81 mg Oral Daily  . carvedilol  12.5 mg Oral BID WC  . feeding supplement (NEPRO CARB STEADY)  237 mL Oral TID BM  . [START ON 03/07/2020] fentaNYL  1 patch Transdermal Q72H  . heparin  5,000 Units Subcutaneous Q8H  . isosorbide-hydrALAZINE  1 tablet Oral BID  . polyethylene glycol  17 g Oral Daily  . predniSONE  5 mg Oral Q breakfast  . tamsulosin  0.4 mg Oral BID  . vitamin B-12  1,000 mcg Oral Daily     LOS: 1 day   Cherene Altes, MD [facility] Office  [phone] Pager - Text Page per Shea Evans  If 7PM-7AM, please contact night-coverage per Amion 03/06/2020, 4:43 PM

## 2020-03-07 LAB — COMPREHENSIVE METABOLIC PANEL
ALT: 11 U/L (ref 0–44)
AST: 17 U/L (ref 15–41)
Albumin: 2.6 g/dL — ABNORMAL LOW (ref 3.5–5.0)
Alkaline Phosphatase: 180 U/L — ABNORMAL HIGH (ref 38–126)
Anion gap: 8 (ref 5–15)
BUN: 45 mg/dL — ABNORMAL HIGH (ref 8–23)
CO2: 27 mmol/L (ref 22–32)
Calcium: 7.9 mg/dL — ABNORMAL LOW (ref 8.9–10.3)
Chloride: 102 mmol/L (ref 98–111)
Creatinine, Ser: 2.93 mg/dL — ABNORMAL HIGH (ref 0.61–1.24)
GFR, Estimated: 20 mL/min — ABNORMAL LOW (ref >60)
Glucose, Bld: 98 mg/dL (ref 70–99)
Potassium: 4.1 mmol/L (ref 3.5–5.1)
Sodium: 137 mmol/L (ref 135–145)
Total Bilirubin: 0.4 mg/dL (ref 0.3–1.2)
Total Protein: 5.4 g/dL — ABNORMAL LOW (ref 6.5–8.1)

## 2020-03-07 LAB — RPR: RPR Ser Ql: NONREACTIVE

## 2020-03-07 LAB — CBC
HCT: 30.5 % — ABNORMAL LOW (ref 39.0–52.0)
Hemoglobin: 9.5 g/dL — ABNORMAL LOW (ref 13.0–17.0)
MCH: 30.5 pg (ref 26.0–34.0)
MCHC: 31.1 g/dL (ref 30.0–36.0)
MCV: 98.1 fL (ref 80.0–100.0)
Platelets: 205 10*3/uL (ref 150–400)
RBC: 3.11 MIL/uL — ABNORMAL LOW (ref 4.22–5.81)
RDW: 18.9 % — ABNORMAL HIGH (ref 11.5–15.5)
WBC: 8.1 10*3/uL (ref 4.0–10.5)
nRBC: 1 % — ABNORMAL HIGH (ref 0.0–0.2)

## 2020-03-07 MED ORDER — QUETIAPINE FUMARATE 25 MG PO TABS
12.5000 mg | ORAL_TABLET | Freq: Every day | ORAL | Status: DC
  Administered 2020-03-07 – 2020-03-11 (×5): 12.5 mg via ORAL
  Filled 2020-03-07 (×5): qty 1

## 2020-03-07 NOTE — Progress Notes (Addendum)
Tony CAPPELLETTI  OTL:[id number] DOB: 10/28/1930 DOA: 03/04/2020 PCP: [name], MD    Brief Narrative:  [phone] with a history of metastatic prostate cancer (mets to bone), recently diagnosed systolic CHF, severe osteoarthritis on chronic narcotics, CKD stage IIIb, and CAD who presented to the ED with 2-3 days of severe progressive weakness.  He presented to his Oncologist's office where he was found to be suffering with acute renal failure.  He was transferred to the ED where he was also found to be anemic.  Significant Events:  11/4 admit via ED  Antimicrobials:  None  DVT prophylaxis: Lovenox  Subjective: Afebrile.  Vital signs stable. Creatinine slowly but steadily improving.  Denies any new complaints but remains mildly confused.  No fevers chills abdominal pain chest pain or shortness of breath.  Reports good appetite.  Assessment & Plan:  Acute renal failure on CKD stage IIIb No obstruction on renal ultrasound - likely due to poor oral intake plus Lasix -continue to gently hydrate -creatinine continues to trend downward - baseline creatinine appears to be approximately 2.0 as of last month  Recent Labs  Lab 03/04/20 1404 03/05/20 0621 03/06/20 1023 03/07/20 0411  CREATININE 3.89* 3.51* 3.11* 2.93*     Altered mental status - acute delirium Possibly due to [facility] and ARF - TSH normal -  Ammonia not signif elevated - RPR negative - B12 and folate not low Sept 2021 - ?CNS metastatic disease - Ca2+ is not elevated - consider imaging of head   Macrocytic anemia Hemoglobin 7.5 at presentation -baseline 8.6 approximately 10 days ago -transfused 2 unit PRBC - no evidence of active bleeding - Hgb holding steady   Recent Labs  Lab 03/04/20 1404 03/05/20 0621 03/06/20 1023 03/07/20 0411  HGB 7.5* 8.2* 10.3* 9.5*    Metastatic prostate cancer Under care of Dr. [name] -continue prednisone - Zytiga stopped as it was felt to be contributing to his CHF - Oncology  following  Recently diagnosed chronic systolic CHF EF 63-84% - holding diuretic - continue Coreg, BiDil - no significant edema presently  [facility] Weights   03/04/20 2009  Weight: 78.2 kg     Severe osteoarthritis/chronic pain Continue fentanyl patch and as needed oxycodone  Generalized weakness/physical deconditioning Uses a walker at baseline - therapy suggests HHPT/OT and 24hr assist   Code Status: NO CODE BLUE Family Communication: Care discussed with daughter via phone at length Status is: Inpatient  Remains inpatient appropriate because:Inpatient level of care appropriate due to severity of illness   Dispo: The patient is from: Home              Anticipated d/c is to: to be determined              Anticipated d/c date is: 2 days              Patient currently is not medically stable to d/c.   Consultants:  Oncology  Objective: Blood pressure 110/70, pulse 71, temperature 98.5 F (36.9 C), temperature source Oral, resp. rate 18, height 5\' 7"  (1.702 m), weight 78.2 kg, SpO2 96 %.  Intake/Output Summary (Last 24 hours) at 03/07/2020 0934 Last data filed at 03/07/2020 0900 Gross per 24 hour  Intake 1502.5 ml  Output 450 ml  Net 1052.5 ml   Filed Weights   03/04/20 2009  Weight: 78.2 kg    Examination: General: No acute respiratory distress Lungs: Clear to auscultation bilaterally w/o wheeze Cardiovascular: RRR  Abdomen: NT/ND, soft,  bs+, no mass Extremities: No signif C/C/E B lower extremities  CBC: Recent Labs  Lab 03/04/20 1404 03/04/20 1404 03/05/20 0621 03/06/20 1023 03/07/20 0411  WBC 6.3   < > 6.5 7.9 8.1  NEUTROABS 4.5  --   --   --   --   HGB 7.5*   < > 8.2* 10.3* 9.5*  HCT 25.4*   < > 26.4* 33.0* 30.5*  MCV 102.8*   < > 101.9* 97.9 98.1  PLT 222   < > 184 213 205   < > = values in this interval not displayed.   Basic Metabolic Panel: Recent Labs  Lab 03/05/20 0621 03/06/20 1023 03/07/20 0411  NA 136 139 137  K 3.8 4.6 4.1  CL 97*  102 102  CO2 25 28 27   GLUCOSE 102* 163* 98  BUN 36* 43* 45*  CREATININE 3.51* 3.11* 2.93*  CALCIUM 8.2* 8.2* 7.9*   GFR: Estimated Creatinine Clearance: 16.6 mL/min (A) (by C-G formula based on SCr of 2.93 mg/dL (H)).  Liver Function Tests: Recent Labs  Lab 03/04/20 1404 03/06/20 1023 03/07/20 0411  AST 15 18 17   ALT 7 21 11   ALKPHOS 228* 210* 180*  BILITOT 0.5 0.6 0.4  PROT 6.3* 6.2* 5.4*  ALBUMIN 2.8* 3.0* 2.6*    Recent Labs  Lab 03/06/20 1023  AMMONIA 37*    HbA1C: Hgb A1c MFr Bld  Date/Time Value Ref Range Status  07/20/2017 09:03 PM 4.5 (L) 4.8 - 5.6 % Final    Comment:    (NOTE) Pre diabetes:          5.7%-6.4% Diabetes:              >6.4% Glycemic control for   <7.0% adults with diabetes      Recent Results (from the past 240 hour(s))  Respiratory Panel by RT PCR (Flu A&B, Covid) - Nasopharyngeal Swab     Status: None   Collection Time: 03/04/20  4:57 PM   Specimen: Nasopharyngeal Swab  Result Value Ref Range Status   SARS Coronavirus 2 by RT PCR NEGATIVE NEGATIVE Final    Comment: (NOTE) SARS-CoV-2 target nucleic acids are NOT DETECTED.  The SARS-CoV-2 RNA is generally detectable in upper respiratoy specimens during the acute phase of infection. The lowest concentration of SARS-CoV-2 viral copies this assay can detect is 131 copies/mL. A negative result does not preclude SARS-Cov-2 infection and should not be used as the sole basis for treatment or other patient management decisions. A negative result may occur with  improper specimen collection/handling, submission of specimen other than nasopharyngeal swab, presence of viral mutation(s) within the areas targeted by this assay, and inadequate number of viral copies (<131 copies/mL). A negative result must be combined with clinical observations, patient history, and epidemiological information. The expected result is Negative.  Fact Sheet for Patients:   PinkCheek.be  Fact Sheet for Healthcare Providers:  GravelBags.it  This test is no t yet approved or cleared by the [place] FDA and  has been authorized for detection and/or diagnosis of SARS-CoV-2 by FDA under an Emergency Use Authorization (EUA). This EUA will remain  in effect (meaning this test can be used) for the duration of the COVID-19 declaration under Section 564(b)(1) of the Act, 21 U.S.C. section 360bbb-3(b)(1), unless the authorization is terminated or revoked sooner.     Influenza A by PCR NEGATIVE NEGATIVE Final   Influenza B by PCR NEGATIVE NEGATIVE Final    Comment: (NOTE) The Xpert Xpress  SARS-CoV-2/FLU/RSV assay is intended as an aid in  the diagnosis of influenza from Nasopharyngeal swab specimens and  should not be used as a sole basis for treatment. Nasal washings and  aspirates are unacceptable for Xpert Xpress SARS-CoV-2/FLU/RSV  testing.  Fact Sheet for Patients: PinkCheek.be  Fact Sheet for Healthcare Providers: GravelBags.it  This test is not yet approved or cleared by the [place] FDA and  has been authorized for detection and/or diagnosis of SARS-CoV-2 by  FDA under an Emergency Use Authorization (EUA). This EUA will remain  in effect (meaning this test can be used) for the duration of the  Covid-19 declaration under Section 564(b)(1) of the Act, 21  U.S.C. section 360bbb-3(b)(1), unless the authorization is  terminated or revoked. Performed at [facility], [name] [address] [place], [place] [address]      Scheduled Meds: . aspirin EC  81 mg Oral Daily  . carvedilol  12.5 mg Oral BID WC  . feeding supplement (NEPRO CARB STEADY)  237 mL Oral TID BM  . fentaNYL  1 patch Transdermal Q72H  . heparin  5,000 Units Subcutaneous Q8H  . isosorbide-hydrALAZINE  1 tablet Oral BID  . multivitamin with minerals   1 tablet Oral Daily  . polyethylene glycol  17 g Oral Daily  . predniSONE  5 mg Oral Q breakfast  . tamsulosin  0.4 mg Oral BID  . vitamin B-12  1,000 mcg Oral Daily     LOS: 2 days   Cherene Altes, MD [facility] Office  [phone] Pager - Text Page per Shea Evans  If 7PM-7AM, please contact night-coverage per Amion 03/07/2020, 9:34 AM

## 2020-03-08 ENCOUNTER — Inpatient Hospital Stay (HOSPITAL_COMMUNITY): Payer: Medicare Other

## 2020-03-08 LAB — COMPREHENSIVE METABOLIC PANEL
ALT: 11 U/L (ref 0–44)
AST: 17 U/L (ref 15–41)
Albumin: 2.8 g/dL — ABNORMAL LOW (ref 3.5–5.0)
Alkaline Phosphatase: 186 U/L — ABNORMAL HIGH (ref 38–126)
Anion gap: 7 (ref 5–15)
BUN: 50 mg/dL — ABNORMAL HIGH (ref 8–23)
CO2: 28 mmol/L (ref 22–32)
Calcium: 7.9 mg/dL — ABNORMAL LOW (ref 8.9–10.3)
Chloride: 102 mmol/L (ref 98–111)
Creatinine, Ser: 2.28 mg/dL — ABNORMAL HIGH (ref 0.61–1.24)
GFR, Estimated: 27 mL/min — ABNORMAL LOW (ref >60)
Glucose, Bld: 108 mg/dL — ABNORMAL HIGH (ref 70–99)
Potassium: 4 mmol/L (ref 3.5–5.1)
Sodium: 137 mmol/L (ref 135–145)
Total Bilirubin: 0.7 mg/dL (ref 0.3–1.2)
Total Protein: 5.3 g/dL — ABNORMAL LOW (ref 6.5–8.1)

## 2020-03-08 LAB — CBC
HCT: 30.6 % — ABNORMAL LOW (ref 39.0–52.0)
Hemoglobin: 9.4 g/dL — ABNORMAL LOW (ref 13.0–17.0)
MCH: 30.8 pg (ref 26.0–34.0)
MCHC: 30.7 g/dL (ref 30.0–36.0)
MCV: 100.3 fL — ABNORMAL HIGH (ref 80.0–100.0)
Platelets: 215 10*3/uL (ref 150–400)
RBC: 3.05 MIL/uL — ABNORMAL LOW (ref 4.22–5.81)
RDW: 18.6 % — ABNORMAL HIGH (ref 11.5–15.5)
WBC: 6.8 10*3/uL (ref 4.0–10.5)
nRBC: 0.9 % — ABNORMAL HIGH (ref 0.0–0.2)

## 2020-03-08 NOTE — Care Management Important Message (Signed)
Important Message  Patient Details IM Letter given to the Patient Name: Tony [name] MRN: [id number] Date of Birth: Oct 13, 1930   Medicare Important Message Given:  Yes     Kerin Salen 03/08/2020, 11:12 AM

## 2020-03-08 NOTE — Progress Notes (Signed)
Tony [name]  ELF:[id number] DOB: 1931-03-17 DOA: 03/04/2020 PCP: [name], MD    Brief Narrative:  [phone] with a history of metastatic prostate cancer (mets to bone), recently diagnosed systolic CHF, severe osteoarthritis on chronic narcotics, CKD stage IIIb, and CAD who presented to the ED with 2-3 days of severe progressive weakness.  He presented to his Oncologist's office where he was found to be suffering with acute renal failure.  He was transferred to the ED where he was also found to be anemic.  Significant Events:  11/4 admit via ED  Antimicrobials:  None  DVT prophylaxis: Lovenox  Subjective: Afebrile.  Vital signs stable.  Creatinine trending downward and now almost at baseline.  No significant change in mental status overnight.  No new complaints.  Assessment & Plan:  Acute renal failure on CKD stage IIIb No obstruction on renal ultrasound - likely due to poor oral intake plus Lasix -continue to gently hydrate -creatinine continues to trend downward - baseline creatinine appears to be approximately 2.0 as of last month  Recent Labs  Lab 03/04/20 1404 03/05/20 0621 03/06/20 1023 03/07/20 0411 03/08/20 0359  CREATININE 3.89* 3.51* 3.11* 2.93* 2.28*     Altered mental status - acute delirium Possibly due to [facility] and ARF - TSH normal -  Ammonia not signif elevated - RPR negative - B12 and folate not low Sept 2021 - ?CNS metastatic disease > MRI brain pending - Ca2+ is not elevated  Macrocytic anemia Hemoglobin 7.5 at presentation -baseline 8.6 approximately 10 days ago -transfused 2 unit PRBC - no evidence of active bleeding - Hgb holding steady   Recent Labs  Lab 03/04/20 1404 03/05/20 0621 03/06/20 1023 03/07/20 0411 03/08/20 0359  HGB 7.5* 8.2* 10.3* 9.5* 9.4*    Metastatic prostate cancer Under care of Dr. [name] -continue prednisone - Zytiga stopped as it was felt to be contributing to his CHF - Oncology following  Recently diagnosed chronic  systolic CHF EF 77-82% - holding diuretic for now - continue Coreg, BiDil  Filed Weights   03/04/20 2009  Weight: 78.2 kg     Severe osteoarthritis/chronic pain Continue fentanyl patch and as needed oxycodone  Generalized weakness/physical deconditioning Uses a walker at baseline - therapy suggests HHPT/OT and 24hr assist   Code Status: NO CODE BLUE Family Communication: Care discussed with daughter via phone at length yesterday  Status is: Inpatient  Remains inpatient appropriate because:Inpatient level of care appropriate due to severity of illness   Dispo: The patient is from: Home              Anticipated d/c is to: to be determined              Anticipated d/c date is: 2 days              Patient currently is not medically stable to d/c.   Consultants:  Oncology  Objective: Blood pressure 118/72, pulse 66, temperature 97.8 F (36.6 C), temperature source Oral, resp. rate 18, height 5\' 7"  (1.702 m), weight 78.2 kg, SpO2 98 %.  Intake/Output Summary (Last 24 hours) at 03/08/2020 0914 Last data filed at 03/08/2020 0506 Gross per 24 hour  Intake 480 ml  Output 275 ml  Net 205 ml   Filed Weights   03/04/20 2009  Weight: 78.2 kg    Examination: General: No acute respiratory distress Lungs: CTA B - no wheezing  Cardiovascular: RRR w/o M  Abdomen: NT/ND, soft, bs+, no mass Extremities:  trace edema B LE   CBC: Recent Labs  Lab 03/04/20 1404 03/05/20 0621 03/06/20 1023 03/07/20 0411 03/08/20 0359  WBC 6.3   < > 7.9 8.1 6.8  NEUTROABS 4.5  --   --   --   --   HGB 7.5*   < > 10.3* 9.5* 9.4*  HCT 25.4*   < > 33.0* 30.5* 30.6*  MCV 102.8*   < > 97.9 98.1 100.3*  PLT 222   < > 213 205 215   < > = values in this interval not displayed.   Basic Metabolic Panel: Recent Labs  Lab 03/06/20 1023 03/07/20 0411 03/08/20 0359  NA 139 137 137  K 4.6 4.1 4.0  CL 102 102 102  CO2 28 27 28   GLUCOSE 163* 98 108*  BUN 43* 45* 50*  CREATININE 3.11* 2.93* 2.28*    CALCIUM 8.2* 7.9* 7.9*   GFR: Estimated Creatinine Clearance: 21.3 mL/min (A) (by C-G formula based on SCr of 2.28 mg/dL (H)).  Liver Function Tests: Recent Labs  Lab 03/04/20 1404 03/06/20 1023 03/07/20 0411 03/08/20 0359  AST 15 18 17 17   ALT 7 21 11 11   ALKPHOS 228* 210* 180* 186*  BILITOT 0.5 0.6 0.4 0.7  PROT 6.3* 6.2* 5.4* 5.3*  ALBUMIN 2.8* 3.0* 2.6* 2.8*    Recent Labs  Lab 03/06/20 1023  AMMONIA 37*    HbA1C: Hgb A1c MFr Bld  Date/Time Value Ref Range Status  07/20/2017 09:03 PM 4.5 (L) 4.8 - 5.6 % Final    Comment:    (NOTE) Pre diabetes:          5.7%-6.4% Diabetes:              >6.4% Glycemic control for   <7.0% adults with diabetes      Recent Results (from the past 240 hour(s))  Respiratory Panel by RT PCR (Flu A&B, Covid) - Nasopharyngeal Swab     Status: None   Collection Time: 03/04/20  4:57 PM   Specimen: Nasopharyngeal Swab  Result Value Ref Range Status   SARS Coronavirus 2 by RT PCR NEGATIVE NEGATIVE Final    Comment: (NOTE) SARS-CoV-2 target nucleic acids are NOT DETECTED.  The SARS-CoV-2 RNA is generally detectable in upper respiratoy specimens during the acute phase of infection. The lowest concentration of SARS-CoV-2 viral copies this assay can detect is 131 copies/mL. A negative result does not preclude SARS-Cov-2 infection and should not be used as the sole basis for treatment or other patient management decisions. A negative result may occur with  improper specimen collection/handling, submission of specimen other than nasopharyngeal swab, presence of viral mutation(s) within the areas targeted by this assay, and inadequate number of viral copies (<131 copies/mL). A negative result must be combined with clinical observations, patient history, and epidemiological information. The expected result is Negative.  Fact Sheet for Patients:  PinkCheek.be  Fact Sheet for Healthcare Providers:   GravelBags.it  This test is no t yet approved or cleared by the [place] FDA and  has been authorized for detection and/or diagnosis of SARS-CoV-2 by FDA under an Emergency Use Authorization (EUA). This EUA will remain  in effect (meaning this test can be used) for the duration of the COVID-19 declaration under Section 564(b)(1) of the Act, 21 U.S.C. section 360bbb-3(b)(1), unless the authorization is terminated or revoked sooner.     Influenza A by PCR NEGATIVE NEGATIVE Final   Influenza B by PCR NEGATIVE NEGATIVE Final    Comment: (  NOTE) The Xpert Xpress SARS-CoV-2/FLU/RSV assay is intended as an aid in  the diagnosis of influenza from Nasopharyngeal swab specimens and  should not be used as a sole basis for treatment. Nasal washings and  aspirates are unacceptable for Xpert Xpress SARS-CoV-2/FLU/RSV  testing.  Fact Sheet for Patients: PinkCheek.be  Fact Sheet for Healthcare Providers: GravelBags.it  This test is not yet approved or cleared by the [place] FDA and  has been authorized for detection and/or diagnosis of SARS-CoV-2 by  FDA under an Emergency Use Authorization (EUA). This EUA will remain  in effect (meaning this test can be used) for the duration of the  Covid-19 declaration under Section 564(b)(1) of the Act, 21  U.S.C. section 360bbb-3(b)(1), unless the authorization is  terminated or revoked. Performed at [facility], [place] [address] [place], [place] [address]      Scheduled Meds: . aspirin EC  81 mg Oral Daily  . carvedilol  12.5 mg Oral BID WC  . feeding supplement (NEPRO CARB STEADY)  237 mL Oral TID BM  . fentaNYL  1 patch Transdermal Q72H  . heparin  5,000 Units Subcutaneous Q8H  . isosorbide-hydrALAZINE  1 tablet Oral BID  . multivitamin with minerals  1 tablet Oral Daily  . polyethylene glycol  17 g Oral Daily  . predniSONE  5 mg  Oral Q breakfast  . QUEtiapine  12.5 mg Oral QHS  . tamsulosin  0.4 mg Oral BID  . vitamin B-12  1,000 mcg Oral Daily     LOS: 3 days   Cherene Altes, MD [facility] Office  [phone] Pager - Text Page per Amion  If 7PM-7AM, please contact night-coverage per Amion 03/08/2020, 9:14 AM

## 2020-03-08 NOTE — TOC Progression Note (Signed)
Transition of Care [facility]) - Progression Note    Patient Details  Name: Tony [name] MRN: [id number] Date of Birth: Oct 19, 1930  Transition of Care [facility]) CM/SW [place], [place] Phone Number: 03/08/2020, 2:10 PM  Clinical Narrative:   Spoke with [name] (Daughter) [phone] to talk about disposition plan.  She states patient lives at home with she and her family, husband and children.  Besides [place] services from [facility], he also gets personal care assistance services in the home at the rate of 3 hours a day.  She is alerting Janeece Riggers that he is in the hospital and there is a change in level of need, with the hope that his hours will be increased to 4 or 5 a day.  She will also reach out to [employer] to see if he can get additional hours through that funding source.  We talked about the limits of palliative care, as well as the process for getting into an ALF-likely memory care-if his condition does not improve , and progressively gets worse.  At this point, she is awaiting the results of the MRI to help in her understanding of what is going on, and plans to take her father home from the hospital. [facility] will continue to follow during the course of hospitalization.     Expected Discharge Plan: [place] Barriers to Discharge: No Barriers Identified  Expected Discharge Plan and Services Expected Discharge Plan: [place]   Discharge Planning Services: CM Consult Post Acute Care Choice: [place] arrangements for the past 2 months: Single Family Home                                       Social Determinants of Health (SDOH) Interventions    Readmission Risk Interventions No flowsheet data found.

## 2020-03-09 ENCOUNTER — Inpatient Hospital Stay: Payer: Medicare Other | Admitting: Hematology

## 2020-03-09 ENCOUNTER — Inpatient Hospital Stay: Payer: Medicare Other

## 2020-03-09 ENCOUNTER — Other Ambulatory Visit: Payer: Medicare Other

## 2020-03-09 ENCOUNTER — Ambulatory Visit: Payer: Medicare Other

## 2020-03-09 LAB — BASIC METABOLIC PANEL
Anion gap: 8 (ref 5–15)
BUN: 53 mg/dL — ABNORMAL HIGH (ref 8–23)
CO2: 27 mmol/L (ref 22–32)
Calcium: 7.9 mg/dL — ABNORMAL LOW (ref 8.9–10.3)
Chloride: 103 mmol/L (ref 98–111)
Creatinine, Ser: 3.03 mg/dL — ABNORMAL HIGH (ref 0.61–1.24)
GFR, Estimated: 19 mL/min — ABNORMAL LOW (ref >60)
Glucose, Bld: 122 mg/dL — ABNORMAL HIGH (ref 70–99)
Potassium: 4.5 mmol/L (ref 3.5–5.1)
Sodium: 138 mmol/L (ref 135–145)

## 2020-03-09 LAB — CBC
HCT: 30.8 % — ABNORMAL LOW (ref 39.0–52.0)
Hemoglobin: 9.3 g/dL — ABNORMAL LOW (ref 13.0–17.0)
MCH: 30.4 pg (ref 26.0–34.0)
MCHC: 30.2 g/dL (ref 30.0–36.0)
MCV: 100.7 fL — ABNORMAL HIGH (ref 80.0–100.0)
Platelets: 207 10*3/uL (ref 150–400)
RBC: 3.06 MIL/uL — ABNORMAL LOW (ref 4.22–5.81)
RDW: 18.5 % — ABNORMAL HIGH (ref 11.5–15.5)
WBC: 6.5 10*3/uL (ref 4.0–10.5)
nRBC: 0.6 % — ABNORMAL HIGH (ref 0.0–0.2)

## 2020-03-09 MED ORDER — FUROSEMIDE 10 MG/ML IJ SOLN
40.0000 mg | Freq: Once | INTRAMUSCULAR | Status: AC
  Administered 2020-03-09: 40 mg via INTRAVENOUS
  Filled 2020-03-09: qty 4

## 2020-03-09 MED ORDER — FUROSEMIDE 40 MG PO TABS
40.0000 mg | ORAL_TABLET | Freq: Every day | ORAL | Status: DC
  Administered 2020-03-10 – 2020-03-12 (×3): 40 mg via ORAL
  Filled 2020-03-09 (×3): qty 1

## 2020-03-09 NOTE — Progress Notes (Signed)
Occupational Therapy Progress Note  Patient with improved mentation this session, very agreeable to work with therapy and following 1 step directions appropriately. Does require min cues for safety locking brakes on rollator prior to functional transfer. Patient supervision to stand sink side for g/h, relies heavily on sink for support. Acute OT services to continue to follow to facilitate D/C home with 24/7 supervision for safety with daily routine.    03/09/20 1400  OT Visit Information  Last OT Received On 03/09/20  Assistance Needed +1  History of Present Illness Tony [name] is a 86 y.o. male with a past medical history of metastatic prostate cancer with mets to the bones, chronic systolic CHF, severe osteoarthritis on narcotics, chronic kidney disease stage IIIb, coronary artery disease who was hospitalized about a month or so ago for congestive heart failure.  This was newly diagnosed.  Patient was seen by cardiology.  Patient was on Zytiga for his prostate cancer which was thought to be the reason for his CHF. Comes to Hospital with progressive weakness. He was found to have acute renal failure with a significant rise in creatinine  Precautions  Precautions Fall  Pain Assessment  Pain Assessment Faces  Faces Pain Scale 0  Cognition  Arousal/Alertness Awake/alert  Behavior During Therapy WFL for tasks assessed/performed  Overall Cognitive Status History of cognitive impairments - at baseline  General Comments patient with improved mentation this session, agreeable to getting out of bed and participating in therapy without any encouragement needed, following 1 step directions appropriately   ADL  Overall ADL's  Needs assistance/impaired  Grooming Wash/dry face;Wash/dry hands;Supervision/safety;Standing  Grooming Details (indicate cue type and reason) patient leans heavily onto sink side for support in standing   Toilet Transfer Minimal assistance;Cueing for safety;Ambulation;RW   Toilet Transfer Details (indicate cue type and reason) simulated with functional mobility, min A for safety  Functional mobility during ADLs Minimal assistance;Cueing for safety;Rolling walker  General ADL Comments patient with improved activity tolerance this session, able to ambulate into bathroom to perform sink side g/h  Bed Mobility  Overal bed mobility Needs Assistance  Bed Mobility Supine to Sit  Supine to sit Supervision  General bed mobility comments no assistnace, cues for safety  Balance  Overall balance assessment Needs assistance  Sitting-balance support Bilateral upper extremity supported;Feet supported  Sitting balance-Leahy Scale Good  Standing balance support Bilateral upper extremity supported;During functional activity  Standing balance-Leahy Scale Poor  Standing balance comment reliant on UE and RW  Transfers  Overall transfer level Needs assistance  Equipment used 4-wheeled walker  Transfers Sit to/from Stand  Sit to Stand Min assist  General transfer comment cues for safety, locking brakes on rollator  OT - End of Session  Equipment Utilized During Treatment Rolling walker;Gait belt  Activity Tolerance Patient tolerated treatment well  Patient left in chair;with call bell/phone within reach;with chair alarm set  Nurse Communication Mobility status  OT Assessment/Plan  OT Plan Discharge plan needs to be updated  OT Visit Diagnosis Other abnormalities of gait and mobility (R26.89);Muscle weakness (generalized) (M62.81)  OT Frequency (ACUTE ONLY) Min 2X/week  Follow Up Recommendations No OT follow up;Supervision/Assistance - 24 hour  OT Equipment None recommended by OT  AM-PAC OT "6 Clicks" Daily Activity Outcome Measure (Version 2)  Help from another person eating meals? 3  Help from another person taking care of personal grooming? 3  Help from another person toileting, which includes using toliet, bedpan, or urinal? 3  Help from another person  bathing  (including washing, rinsing, drying)? 3  Help from another person to put on and taking off regular upper body clothing? 3  Help from another person to put on and taking off regular lower body clothing? 3  6 Click Score 18  OT Goal Progression  Progress towards OT goals Progressing toward goals  Acute Rehab OT Goals  OT Goal Formulation With patient  Time For Goal Achievement 03/19/20  Potential to Achieve Goals Good  OT Time Calculation  OT Start Time (ACUTE ONLY) 1022  OT Stop Time (ACUTE ONLY) 1036  OT Time Calculation (min) 14 min  OT General Charges  $OT Visit 1 Visit  OT Treatments  $Self Care/Home Management  8-22 mins   Delbert Phenix OT OT pager: [phone]

## 2020-03-09 NOTE — Progress Notes (Signed)
Tony [name]  RJJ:[id number] DOB: Sep 14, 1930 DOA: 03/04/2020 PCP: [name], MD    Brief Narrative:  [phone] with a history of metastatic prostate cancer (mets to bone), recently diagnosed systolic CHF, severe osteoarthritis on chronic narcotics, CKD stage IIIb, and CAD who presented to the ED with 2-3 days of severe progressive weakness.  He presented to his Oncologist's office where he was found to be suffering with acute renal failure.  He was transferred to the ED where he was also found to be anemic.  Significant Events:  11/4 admit via ED  Antimicrobials:  None  DVT prophylaxis: Lovenox  Subjective: The patient is alert conversant and quite pleasant.  He has developed recurrent significant pedal edema over the last 14 hours.  He denies chest pain shortness of breath nausea or vomiting.  He states that he feels much better overall.  I had a lengthy discussion with the patient and his daughter at the bedside.  I discussed the results of his MRI which revealed severe chronic small vessel disease and multiple lacunar type infarcts.  I discussed the diagnosis of vascular dementia.  Assessment & Plan:  Acute renal failure on CKD stage IIIb No obstruction on renal ultrasound - likely due to poor oral intake plus Lasix -baseline creatinine appears to be approximately 2.0 as of last month -creatinine increased today possibly due to transitioning into volume overload -resume diuretic and follow renal function  Recent Labs  Lab 03/05/20 0621 03/06/20 1023 03/07/20 0411 03/08/20 0359 03/09/20 0430  CREATININE 3.51* 3.11* 2.93* 2.28* 3.03*     Altered mental status - progressive vascular dementia  Possibly due to [facility] and ARF - TSH normal -  Ammonia not signif elevated - RPR negative - B12 and folate not low Sept 2021 - Ca2+ is not elevated -MRI brain reveals no metastatic disease but evidence of severe small vessel disease and multiple lacunar infarcts -I believe this patient's  persistent cognitive deficit is due to vascular dementia with a rapid acute decline of late related to his progressive renal failure -I have discussed this at length with the patient and his daughter and explained that I do not expect his mental status to improve significantly beyond his current level  Macrocytic anemia Hemoglobin 7.5 at presentation -baseline 8.6 approximately 10 days ago -transfused 2 unit PRBC - no evidence of active bleeding - Hgb holding steady   Recent Labs  Lab 03/05/20 0621 03/06/20 1023 03/07/20 0411 03/08/20 0359 03/09/20 0430  HGB 8.2* 10.3* 9.5* 9.4* 9.3*    Metastatic prostate cancer Under care of Dr. [name] - continue prednisone - Zytiga stopped as it was felt to be contributing to his CHF - Oncology following  Recently diagnosed chronic systolic CHF EF 10-93% - continue Coreg, BiDil -balancing his volume status will be very difficult as he rapidly transitioned from euvolemia to volume overload even in the hospital over a 14-hour period -resume Lasix -watch I's and O's -monitor daily weights -we will need to establish a target dry weight that maximizes his functionality as balanced against renal function and then allow him to be dosed with a sliding scale of Lasix at home in order to attempt to maintain this weight   Severe osteoarthritis/chronic pain Continue fentanyl patch and as needed oxycodone  Generalized weakness/physical deconditioning Uses a walker at baseline - therapy suggests HHPT/OT and 24hr assist   Code Status: NO CODE BLUE Family Communication: Care discussed with daughter at bedside Status is: Inpatient  Remains inpatient appropriate  because:Inpatient level of care appropriate due to severity of illness   Dispo: The patient is from: Home              Anticipated d/c is to: to be determined              Anticipated d/c date is: 2 days              Patient currently is not medically stable to d/c.   Consultants:   Oncology  Objective: Blood pressure 113/63, pulse 75, temperature 98.5 F (36.9 C), temperature source Oral, resp. rate 20, height 5\' 7"  (1.702 m), weight 78.2 kg, SpO2 100 %.  Intake/Output Summary (Last 24 hours) at 03/09/2020 0912 Last data filed at 03/08/2020 2354 Gross per 24 hour  Intake 2621.94 ml  Output 325 ml  Net 2296.94 ml   Filed Weights   03/04/20 2009  Weight: 78.2 kg    Examination: General: No acute respiratory distress -alert and pleasant Lungs: CTA B -fine bibasilar crackles Cardiovascular: RRR w/o M  Abdomen: NT/ND, soft, bs+, no mass Extremities: 3+ pedal edema bilaterally  CBC: Recent Labs  Lab 03/04/20 1404 03/05/20 0621 03/07/20 0411 03/08/20 0359 03/09/20 0430  WBC 6.3   < > 8.1 6.8 6.5  NEUTROABS 4.5  --   --   --   --   HGB 7.5*   < > 9.5* 9.4* 9.3*  HCT 25.4*   < > 30.5* 30.6* 30.8*  MCV 102.8*   < > 98.1 100.3* 100.7*  PLT 222   < > 205 215 207   < > = values in this interval not displayed.   Basic Metabolic Panel: Recent Labs  Lab 03/07/20 0411 03/08/20 0359 03/09/20 0430  NA 137 137 138  K 4.1 4.0 4.5  CL 102 102 103  CO2 27 28 27   GLUCOSE 98 108* 122*  BUN 45* 50* 53*  CREATININE 2.93* 2.28* 3.03*  CALCIUM 7.9* 7.9* 7.9*   GFR: Estimated Creatinine Clearance: 16.1 mL/min (A) (by C-G formula based on SCr of 3.03 mg/dL (H)).  Liver Function Tests: Recent Labs  Lab 03/04/20 1404 03/06/20 1023 03/07/20 0411 03/08/20 0359  AST 15 18 17 17   ALT 7 21 11 11   ALKPHOS 228* 210* 180* 186*  BILITOT 0.5 0.6 0.4 0.7  PROT 6.3* 6.2* 5.4* 5.3*  ALBUMIN 2.8* 3.0* 2.6* 2.8*    Recent Labs  Lab 03/06/20 1023  AMMONIA 37*    HbA1C: Hgb A1c MFr Bld  Date/Time Value Ref Range Status  07/20/2017 09:03 PM 4.5 (L) 4.8 - 5.6 % Final    Comment:    (NOTE) Pre diabetes:          5.7%-6.4% Diabetes:              >6.4% Glycemic control for   <7.0% adults with diabetes      Recent Results (from the past 240 hour(s))   Respiratory Panel by RT PCR (Flu A&B, Covid) - Nasopharyngeal Swab     Status: None   Collection Time: 03/04/20  4:57 PM   Specimen: Nasopharyngeal Swab  Result Value Ref Range Status   SARS Coronavirus 2 by RT PCR NEGATIVE NEGATIVE Final    Comment: (NOTE) SARS-CoV-2 target nucleic acids are NOT DETECTED.  The SARS-CoV-2 RNA is generally detectable in upper respiratoy specimens during the acute phase of infection. The lowest concentration of SARS-CoV-2 viral copies this assay can detect is 131 copies/mL. A negative result does not preclude SARS-Cov-2  infection and should not be used as the sole basis for treatment or other patient management decisions. A negative result may occur with  improper specimen collection/handling, submission of specimen other than nasopharyngeal swab, presence of viral mutation(s) within the areas targeted by this assay, and inadequate number of viral copies (<131 copies/mL). A negative result must be combined with clinical observations, patient history, and epidemiological information. The expected result is Negative.  Fact Sheet for Patients:  PinkCheek.be  Fact Sheet for Healthcare Providers:  GravelBags.it  This test is no t yet approved or cleared by the [place] FDA and  has been authorized for detection and/or diagnosis of SARS-CoV-2 by FDA under an Emergency Use Authorization (EUA). This EUA will remain  in effect (meaning this test can be used) for the duration of the COVID-19 declaration under Section 564(b)(1) of the Act, 21 U.S.C. section 360bbb-3(b)(1), unless the authorization is terminated or revoked sooner.     Influenza A by PCR NEGATIVE NEGATIVE Final   Influenza B by PCR NEGATIVE NEGATIVE Final    Comment: (NOTE) The Xpert Xpress SARS-CoV-2/FLU/RSV assay is intended as an aid in  the diagnosis of influenza from Nasopharyngeal swab specimens and  should not be used as  a sole basis for treatment. Nasal washings and  aspirates are unacceptable for Xpert Xpress SARS-CoV-2/FLU/RSV  testing.  Fact Sheet for Patients: PinkCheek.be  Fact Sheet for Healthcare Providers: GravelBags.it  This test is not yet approved or cleared by the [place] FDA and  has been authorized for detection and/or diagnosis of SARS-CoV-2 by  FDA under an Emergency Use Authorization (EUA). This EUA will remain  in effect (meaning this test can be used) for the duration of the  Covid-19 declaration under Section 564(b)(1) of the Act, 21  U.S.C. section 360bbb-3(b)(1), unless the authorization is  terminated or revoked. Performed at [facility], [place] [address] [place], [place] [address]      Scheduled Meds: . aspirin EC  81 mg Oral Daily  . carvedilol  12.5 mg Oral BID WC  . feeding supplement (NEPRO CARB STEADY)  237 mL Oral TID BM  . fentaNYL  1 patch Transdermal Q72H  . heparin  5,000 Units Subcutaneous Q8H  . isosorbide-hydrALAZINE  1 tablet Oral BID  . multivitamin with minerals  1 tablet Oral Daily  . polyethylene glycol  17 g Oral Daily  . predniSONE  5 mg Oral Q breakfast  . QUEtiapine  12.5 mg Oral QHS  . tamsulosin  0.4 mg Oral BID  . vitamin B-12  1,000 mcg Oral Daily     LOS: 4 days   Cherene Altes, MD [facility] Office  [phone] Pager - Text Page per Amion  If 7PM-7AM, please contact night-coverage per Amion 03/09/2020, 9:12 AM

## 2020-03-09 NOTE — Progress Notes (Signed)
Physical Therapy Treatment Patient Details Name: Tony [name] MRN: [id number] DOB: Apr 30, 1931 Today's Date: 03/09/2020    History of Present Illness Tony [name] is a 86 y.o. male with a past medical history of metastatic prostate cancer with mets to the bones, chronic systolic CHF, severe osteoarthritis on narcotics, chronic kidney disease stage IIIb, coronary artery disease who was hospitalized about a month or so ago for congestive heart failure.  This was newly diagnosed.  Patient was seen by cardiology.  Patient was on Zytiga for his prostate cancer which was thought to be the reason for his CHF. Comes to Hospital with progressive weakness. He was found to have acute renal failure with a significant rise in creatinine    PT Comments    The patient'sMS appears more clearly this visit. Patient also demonstrates improved  Mobility, Ambulated  X 80' using  4 wheeled RW. Continue PT.  Follow Up Recommendations  Home health PT;Supervision/Assistance - 24 hour     Equipment Recommendations  None recommended by PT    Recommendations for Other Services       Precautions / Restrictions Precautions Precautions: Fall Restrictions Weight Bearing Restrictions: No    Mobility  Bed Mobility Overal bed mobility: Needs Assistance Bed Mobility: Supine to Sit     Supine to sit: Supervision     General bed mobility comments: no assistnace, cues for safety  Transfers Overall transfer level: Needs assistance Equipment used: 4-wheeled walker Transfers: Sit to/from Stand Sit to Stand: Min assist         General transfer comment: cues for safety, locking brakes on rollator  Ambulation/Gait Ambulation/Gait assistance: Min assist Gait Distance (Feet): 80 Feet Assistive device: 4-wheeled walker Gait Pattern/deviations: Step-through pattern Gait velocity: decr   General Gait Details: gait is improved with rollator, able to turn self around, min/minguard assistance.   Stairs              Wheelchair Mobility    Modified Rankin (Stroke Patients Only)       Balance   Sitting-balance support: Bilateral upper extremity supported;Feet supported Sitting balance-Leahy Scale: Good     Standing balance support: Bilateral upper extremity supported;During functional activity Standing balance-Leahy Scale: Fair Standing balance comment: reliant on UE and RW                            Cognition Arousal/Alertness: Awake/alert Behavior During Therapy: WFL for tasks assessed/performed Overall Cognitive Status: History of cognitive impairments - at baseline                                        Exercises      General Comments        Pertinent Vitals/Pain Faces Pain Scale: No hurt    Home Living                      Prior Function            PT Goals (current goals can now be found in the care plan section) Progress towards PT goals: Progressing toward goals    Frequency    Min 3X/week      PT Plan Current plan remains appropriate    Co-evaluation              AM-PAC PT "6 Clicks" Mobility   Outcome Measure  Help needed turning from your back to your side while in a flat bed without using bedrails?: A Little Help needed moving from lying on your back to sitting on the side of a flat bed without using bedrails?: A Little Help needed moving to and from a bed to a chair (including a wheelchair)?: A Little Help needed standing up from a chair using your arms (e.g., wheelchair or bedside chair)?: A Little Help needed to walk in hospital room?: A Little Help needed climbing 3-5 steps with a railing? : A Lot 6 Click Score: 17    End of Session Equipment Utilized During Treatment: Gait belt Activity Tolerance: Patient tolerated treatment well Patient left: in chair;with call bell/phone within reach;with chair alarm set Nurse Communication: Mobility status PT Visit Diagnosis: Unsteadiness on feet  (R26.81);Difficulty in walking, not elsewhere classified (R26.2)     Time: 6659-9357 PT Time Calculation (min) (ACUTE ONLY): 14 min  Charges:                        Tresa Endo PT Acute Rehabilitation Services Pager [phone] Office [phone]    Claretha Cooper 03/09/2020, 12:34 PM

## 2020-03-10 DIAGNOSIS — I10 Essential (primary) hypertension: Secondary | ICD-10-CM

## 2020-03-10 DIAGNOSIS — I2510 Atherosclerotic heart disease of native coronary artery without angina pectoris: Secondary | ICD-10-CM

## 2020-03-10 LAB — BASIC METABOLIC PANEL
Anion gap: 6 (ref 5–15)
BUN: 62 mg/dL — ABNORMAL HIGH (ref 8–23)
CO2: 28 mmol/L (ref 22–32)
Calcium: 7.6 mg/dL — ABNORMAL LOW (ref 8.9–10.3)
Chloride: 103 mmol/L (ref 98–111)
Creatinine, Ser: 3.01 mg/dL — ABNORMAL HIGH (ref 0.61–1.24)
GFR, Estimated: 19 mL/min — ABNORMAL LOW (ref >60)
Glucose, Bld: 116 mg/dL — ABNORMAL HIGH (ref 70–99)
Potassium: 3.9 mmol/L (ref 3.5–5.1)
Sodium: 137 mmol/L (ref 135–145)

## 2020-03-10 LAB — MAGNESIUM: Magnesium: 2.4 mg/dL (ref 1.7–2.4)

## 2020-03-10 NOTE — Progress Notes (Signed)
PROGRESS NOTE  GAY RAPE TDV:[id number] DOB: 11-02-1930 DOA: 03/04/2020 PCP: [name], MD   LOS: 5 days   Brief Narrative / Interim history: 85 year old male with metastatic prostate cancer (mets to bone), recently diagnosed systolic CHF, severe osteoarthritis on chronic narcotics, chronic kidney disease stage IIIb, CAD who came into the hospital with 2 to 3 days of progressive weakness.  He was seen at his oncologist office, he was found to have acute renal failure and transferred to the ED.  He was also found to be anemic.  Subjective / 24h Interval events: He is doing well this morning, no chest pain, no shortness of breath.  Assessment & Plan: Principal Problem Acute kidney injury on chronic kidney disease stage IIIb-Baseline creatinine appears to be approximately 2, currently at 3 and overall stable in the last couple of days.  He was initially fluid resuscitated but became volume overloaded.  His Lasix has been resumed.  Remains mildly fluid overloaded with trace lower extremity edema, continue oral Lasix  Active Problems Acute metabolic encephalopathy, possible progressive vascular dementia -Underwent an MRI of the brain showed evidence of severe small vessel disease and multiple lacunar infarcts, definitely contributing to his late mental status decline.  TSH normal, ammonia not significantly elevated, RPR negative, B12 folate unremarkable.  Macrocytic anemia -Likely due to renal disease as well as underlying metastatic cancer, he was 7.5 on admission, received units of packed red blood cells, his hemoglobin has improved and has remained stable.  There is no signs of bleeding.  Metastatic prostate cancer -Follows with oncology as an outpatient  Recently diagnosed chronic systolic CHF -EF 06-26%, continue Coreg, BiDil, furosemide.  He apparently transitioned very rapidly from euvolemia to volume overload over less than a day in the hospital.  He will need daily weights  at home  Severe osteoarthritis, chronic pain -Continue fentanyl patch, oxycodone  Generalized weakness, physical deconditioning -Therapy suggest home health and 24-hour assist   Scheduled Meds: . aspirin EC  81 mg Oral Daily  . carvedilol  12.5 mg Oral BID WC  . feeding supplement (NEPRO CARB STEADY)  237 mL Oral TID BM  . fentaNYL  1 patch Transdermal Q72H  . furosemide  40 mg Oral Daily  . heparin  5,000 Units Subcutaneous Q8H  . isosorbide-hydrALAZINE  1 tablet Oral BID  . multivitamin with minerals  1 tablet Oral Daily  . polyethylene glycol  17 g Oral Daily  . predniSONE  5 mg Oral Q breakfast  . QUEtiapine  12.5 mg Oral QHS  . tamsulosin  0.4 mg Oral BID  . vitamin B-12  1,000 mcg Oral Daily   Continuous Infusions: PRN Meds:.acetaminophen **OR** [DISCONTINUED] acetaminophen, heparin lock flush, heparin lock flush, ondansetron **OR** ondansetron (ZOFRAN) IV, oxyCODONE, senna-docusate, sodium chloride flush, sodium chloride flush  Diet Orders (From admission, onward)    Start     Ordered   03/05/20 1823  Diet regular Room service appropriate? Yes; Fluid consistency: Thin  Diet effective now       Question Answer Comment  Room service appropriate? Yes   Fluid consistency: Thin      03/05/20 1822          DVT prophylaxis: heparin injection 5,000 Units Start: 03/04/20 2200     Code Status: DNR  Family Communication: no family at bedside   Status is: Inpatient  Remains inpatient appropriate because:Inpatient level of care appropriate due to severity of illness   Dispo: The patient is from: Home  Anticipated d/c is to: Home              Anticipated d/c date is: 1 day              Patient currently is not medically stable to d/c.   Consultants:  None   Procedures:  None   Microbiology  None   Antimicrobials: None     Objective: Vitals:   03/09/20 0503 03/09/20 1450 03/09/20 2006 03/10/20 0530  BP: 113/63 122/66 (!) 108/55 (!) 113/59    Pulse: 75 76 81 71  Resp: 20 20 15 18   Temp: 98.5 F (36.9 C) 98.1 F (36.7 C) 98.8 F (37.1 C) 98.5 F (36.9 C)  TempSrc: Oral  Oral Oral  SpO2: 100% 97% 94% 98%  Weight:      Height:        Intake/Output Summary (Last 24 hours) at 03/10/2020 1049 Last data filed at 03/10/2020 0950 Gross per 24 hour  Intake 809 ml  Output 350 ml  Net 459 ml   Filed Weights   03/04/20 2009  Weight: 78.2 kg    Examination:  Constitutional: NAD Eyes: no scleral icterus ENMT: Mucous membranes are moist.  Neck: normal, supple Respiratory: clear to auscultation bilaterally, no wheezing, no crackles.   Cardiovascular: Regular rate and rhythm, no murmurs / rubs / gallops.  1+ LE edema. Abdomen: non distended, no tenderness. Bowel sounds positive.  Musculoskeletal: no clubbing / cyanosis.  Skin: no rashes Neurologic: No focal deficits   Data Reviewed: I have independently reviewed following labs and imaging studies   CBC: Recent Labs  Lab 03/04/20 1404 03/04/20 1404 03/05/20 0621 03/06/20 1023 03/07/20 0411 03/08/20 0359 03/09/20 0430  WBC 6.3   < > 6.5 7.9 8.1 6.8 6.5  NEUTROABS 4.5  --   --   --   --   --   --   HGB 7.5*   < > 8.2* 10.3* 9.5* 9.4* 9.3*  HCT 25.4*   < > 26.4* 33.0* 30.5* 30.6* 30.8*  MCV 102.8*   < > 101.9* 97.9 98.1 100.3* 100.7*  PLT 222   < > 184 213 205 215 207   < > = values in this interval not displayed.   Basic Metabolic Panel: Recent Labs  Lab 03/06/20 1023 03/07/20 0411 03/08/20 0359 03/09/20 0430 03/10/20 0408  NA 139 137 137 138 137  K 4.6 4.1 4.0 4.5 3.9  CL 102 102 102 103 103  CO2 28 27 28 27 28   GLUCOSE 163* 98 108* 122* 116*  BUN 43* 45* 50* 53* 62*  CREATININE 3.11* 2.93* 2.28* 3.03* 3.01*  CALCIUM 8.2* 7.9* 7.9* 7.9* 7.6*  MG  --   --   --   --  2.4   Liver Function Tests: Recent Labs  Lab 03/04/20 1404 03/06/20 1023 03/07/20 0411 03/08/20 0359  AST 15 18 17 17   ALT 7 21 11 11   ALKPHOS 228* 210* 180* 186*  BILITOT  0.5 0.6 0.4 0.7  PROT 6.3* 6.2* 5.4* 5.3*  ALBUMIN 2.8* 3.0* 2.6* 2.8*   Coagulation Profile: No results for input(s): INR, PROTIME in the last 168 hours. HbA1C: No results for input(s): HGBA1C in the last 72 hours. CBG: No results for input(s): GLUCAP in the last 168 hours.  Recent Results (from the past 240 hour(s))  Respiratory Panel by RT PCR (Flu A&B, Covid) - Nasopharyngeal Swab     Status: None   Collection Time: 03/04/20  4:57 PM  Specimen: Nasopharyngeal Swab  Result Value Ref Range Status   SARS Coronavirus 2 by RT PCR NEGATIVE NEGATIVE Final    Comment: (NOTE) SARS-CoV-2 target nucleic acids are NOT DETECTED.  The SARS-CoV-2 RNA is generally detectable in upper respiratoy specimens during the acute phase of infection. The lowest concentration of SARS-CoV-2 viral copies this assay can detect is 131 copies/mL. A negative result does not preclude SARS-Cov-2 infection and should not be used as the sole basis for treatment or other patient management decisions. A negative result may occur with  improper specimen collection/handling, submission of specimen other than nasopharyngeal swab, presence of viral mutation(s) within the areas targeted by this assay, and inadequate number of viral copies (<131 copies/mL). A negative result must be combined with clinical observations, patient history, and epidemiological information. The expected result is Negative.  Fact Sheet for Patients:  PinkCheek.be  Fact Sheet for Healthcare Providers:  GravelBags.it  This test is no t yet approved or cleared by the [place] FDA and  has been authorized for detection and/or diagnosis of SARS-CoV-2 by FDA under an Emergency Use Authorization (EUA). This EUA will remain  in effect (meaning this test can be used) for the duration of the COVID-19 declaration under Section 564(b)(1) of the Act, 21 U.S.C. section 360bbb-3(b)(1),  unless the authorization is terminated or revoked sooner.     Influenza A by PCR NEGATIVE NEGATIVE Final   Influenza B by PCR NEGATIVE NEGATIVE Final    Comment: (NOTE) The Xpert Xpress SARS-CoV-2/FLU/RSV assay is intended as an aid in  the diagnosis of influenza from Nasopharyngeal swab specimens and  should not be used as a sole basis for treatment. Nasal washings and  aspirates are unacceptable for Xpert Xpress SARS-CoV-2/FLU/RSV  testing.  Fact Sheet for Patients: PinkCheek.be  Fact Sheet for Healthcare Providers: GravelBags.it  This test is not yet approved or cleared by the [place] FDA and  has been authorized for detection and/or diagnosis of SARS-CoV-2 by  FDA under an Emergency Use Authorization (EUA). This EUA will remain  in effect (meaning this test can be used) for the duration of the  Covid-19 declaration under Section 564(b)(1) of the Act, 21  U.S.C. section 360bbb-3(b)(1), unless the authorization is  terminated or revoked. Performed at [facility], [place] [address] [place], [place] [address]      Radiology Studies: No results found.  Marzetta Board, MD, PhD [facility]  Between 7 am - 7 pm I am available, please contact me via Amion or Securechat  Between 7 pm - 7 am I am not available, please contact night coverage MD/APP via Amion

## 2020-03-11 LAB — BASIC METABOLIC PANEL
Anion gap: 9 (ref 5–15)
BUN: 60 mg/dL — ABNORMAL HIGH (ref 8–23)
CO2: 25 mmol/L (ref 22–32)
Calcium: 7.8 mg/dL — ABNORMAL LOW (ref 8.9–10.3)
Chloride: 103 mmol/L (ref 98–111)
Creatinine, Ser: 2.86 mg/dL — ABNORMAL HIGH (ref 0.61–1.24)
GFR, Estimated: 21 mL/min — ABNORMAL LOW (ref >60)
Glucose, Bld: 110 mg/dL — ABNORMAL HIGH (ref 70–99)
Potassium: 4.1 mmol/L (ref 3.5–5.1)
Sodium: 137 mmol/L (ref 135–145)

## 2020-03-11 LAB — CBC
HCT: 30.4 % — ABNORMAL LOW (ref 39.0–52.0)
Hemoglobin: 9.1 g/dL — ABNORMAL LOW (ref 13.0–17.0)
MCH: 30.8 pg (ref 26.0–34.0)
MCHC: 29.9 g/dL — ABNORMAL LOW (ref 30.0–36.0)
MCV: 103.1 fL — ABNORMAL HIGH (ref 80.0–100.0)
Platelets: 197 10*3/uL (ref 150–400)
RBC: 2.95 MIL/uL — ABNORMAL LOW (ref 4.22–5.81)
RDW: 18.6 % — ABNORMAL HIGH (ref 11.5–15.5)
WBC: 6.8 10*3/uL (ref 4.0–10.5)
nRBC: 0.4 % — ABNORMAL HIGH (ref 0.0–0.2)

## 2020-03-11 NOTE — Care Management Important Message (Signed)
Important Message  Patient Details IM Letter given to the Patient Name: Tony [name] MRN: [id number] Date of Birth: May 18, 1930   Medicare Important Message Given:  Yes     Kerin Salen 03/11/2020, 9:57 AM

## 2020-03-11 NOTE — Progress Notes (Signed)
Physical Therapy Treatment Patient Details Name: Tony [name] MRN: [id number] DOB: 06/05/1930 Today's Date: 03/11/2020    History of Present Illness Tony [name] is a 86 y.o. male with a past medical history of metastatic prostate cancer with mets to the bones, chronic systolic CHF, severe osteoarthritis on narcotics, chronic kidney disease stage IIIb, coronary artery disease who was hospitalized about a month or so ago for congestive heart failure.  This was newly diagnosed.  Patient was seen by cardiology.  Patient was on Zytiga for his prostate cancer which was thought to be the reason for his CHF. Comes to Hospital with progressive weakness. He was found to have acute renal failure with a significant rise in creatinine    PT Comments    Pt agreeable to ambulation with encouragement from therapy. Pt ambulates around room into restroom with increased time to clear past furniture and make turns. Pt cued for upright posture in hallway, but states that is his normal posture due to resting onto RW due to fatigue with ambulation. Pt returns to chair at [facility] and educated on HEP, able to perform LAQ appropriately and declines additional exercise due to wanting to wash up with OT. Pt would continue to benefit from acute PT to progress towards goals and PLOF.   Follow Up Recommendations  Home health PT;Supervision/Assistance - 24 hour     Equipment Recommendations  None recommended by PT    Recommendations for Other Services       Precautions / Restrictions Precautions Precautions: Fall Restrictions Weight Bearing Restrictions: No    Mobility  Bed Mobility Overal bed mobility: Needs Assistance Bed Mobility: Supine to Sit  Supine to sit: Supervision;HOB elevated  General bed mobility comments: increased time and encouragement with HOB elevated to come to sitting EOB  Transfers Overall transfer level: Needs assistance Equipment used: Rolling walker (2 wheeled) Transfers: Sit to/from  Stand Sit to Stand: Min guard  General transfer comment: min G to steady, pt requests therapist to not assist, cues to place hands on bed to push up and use handrail in bathroom to rise from toilet to avoid pulling on RW  Ambulation/Gait Ambulation/Gait assistance: Min guard Gait Distance (Feet): 100 Feet Assistive device: Rolling walker (2 wheeled) Gait Pattern/deviations: Step-through pattern;Decreased stride length;Trunk flexed Gait velocity: decreased   General Gait Details: pt with flexed trunk despite cues for upright posture, navigates past furniture and completes turns with increased time and steps, 1 standing rest break to recover   Stairs             Wheelchair Mobility    Modified Rankin (Stroke Patients Only)       Balance Overall balance assessment: Needs assistance Sitting-balance support: Feet supported Sitting balance-Leahy Scale: Good Sitting balance - Comments: seated EOB   Standing balance support: During functional activity;Bilateral upper extremity supported Standing balance-Leahy Scale: Poor Standing balance comment: reliant on UE support       Cognition Arousal/Alertness: Awake/alert Behavior During Therapy: WFL for tasks assessed/performed Overall Cognitive Status: Within Functional Limits for tasks assessed       Exercises General Exercises - Lower Extremity Long Arc Quad: Seated;AROM;Strengthening;Both;10 reps    General Comments        Pertinent Vitals/Pain Pain Assessment: No/denies pain    Home Living                      Prior Function            PT Goals (current  goals can now be found in the care plan section) Acute Rehab PT Goals Patient Stated Goal: to rest PT Goal Formulation: With patient Time For Goal Achievement: 03/19/20 Potential to Achieve Goals: Good Progress towards PT goals: Progressing toward goals    Frequency    Min 3X/week      PT Plan Current plan remains appropriate     Co-evaluation PT/OT/SLP Co-Evaluation/Treatment: Yes Reason for Co-Treatment: To address functional/ADL transfers PT goals addressed during session: Mobility/safety with mobility;Balance;Proper use of DME        AM-PAC PT "6 Clicks" Mobility   Outcome Measure  Help needed turning from your back to your side while in a flat bed without using bedrails?: A Little Help needed moving from lying on your back to sitting on the side of a flat bed without using bedrails?: None Help needed moving to and from a bed to a chair (including a wheelchair)?: A Little Help needed standing up from a chair using your arms (e.g., wheelchair or bedside chair)?: A Little Help needed to walk in hospital room?: A Little Help needed climbing 3-5 steps with a railing? : A Lot 6 Click Score: 18    End of Session Equipment Utilized During Treatment: Gait belt Activity Tolerance: Patient tolerated treatment well Patient left: in chair;with call bell/phone within reach;Other (comment) (OT in room) Nurse Communication: Mobility status PT Visit Diagnosis: Unsteadiness on feet (R26.81);Difficulty in walking, not elsewhere classified (R26.2)     Time: 6578-4696 PT Time Calculation (min) (ACUTE ONLY): 20 min  Charges:  $Gait Training: 8-22 mins                      Tori Deandrea Vanpelt PT, DPT 03/11/20, 2:55 PM

## 2020-03-11 NOTE — Progress Notes (Signed)
PROGRESS NOTE  Tony [name] FHL:[id number] DOB: Dec 31, 1930 DOA: 03/04/2020 PCP: [name], MD   LOS: 6 days   Brief Narrative / Interim history: 86 year old male with metastatic prostate cancer (mets to bone), recently diagnosed systolic CHF, severe osteoarthritis on chronic narcotics, chronic kidney disease stage IIIb, CAD who came into the hospital with 2 to 3 days of progressive weakness.  He was seen at his oncologist office, he was found to have acute renal failure and transferred to the ED.  He was also found to be anemic.  Subjective / 24h Interval events: Seems to be in good spirits this morning but feels weak.  Assessment & Plan: Principal Problem Acute kidney injury on chronic kidney disease stage IIIb-Baseline creatinine appears to be approximately 2, currently at 3 and overall stable in the last couple of days.  He was initially fluid resuscitated but became volume overloaded.  His Lasix has been resumed, continue oral Lasix, creatinine continues to improve 2.8 this morning  Active Problems Acute metabolic encephalopathy, possible progressive vascular dementia -Underwent an MRI of the brain showed evidence of severe small vessel disease and multiple lacunar infarcts, definitely contributing to his late mental status decline.  TSH normal, ammonia not significantly elevated, RPR negative, B12 folate unremarkable.  Macrocytic anemia -Likely due to renal disease as well as underlying metastatic cancer, he was 7.5 on admission, received units of packed red blood cells, his hemoglobin has improved and has remained stable.  Hemoglobin overall stable without signs of bleeding  Metastatic prostate cancer -Follows with oncology as an outpatient  Recently diagnosed chronic systolic CHF -EF 37-34%, continue Coreg, BiDil, furosemide.  He apparently transitioned very rapidly from euvolemia to volume overload over less than a day in the hospital.  Needs daily weights at home, this was  discussed with the daughter  Severe osteoarthritis, chronic pain -Continue fentanyl patch, oxycodone  Generalized weakness, physical deconditioning -Therapy suggest home health and 24-hour assist   Scheduled Meds: . aspirin EC  81 mg Oral Daily  . carvedilol  12.5 mg Oral BID WC  . feeding supplement (NEPRO CARB STEADY)  237 mL Oral TID BM  . fentaNYL  1 patch Transdermal Q72H  . furosemide  40 mg Oral Daily  . heparin  5,000 Units Subcutaneous Q8H  . isosorbide-hydrALAZINE  1 tablet Oral BID  . multivitamin with minerals  1 tablet Oral Daily  . polyethylene glycol  17 g Oral Daily  . predniSONE  5 mg Oral Q breakfast  . QUEtiapine  12.5 mg Oral QHS  . tamsulosin  0.4 mg Oral BID  . vitamin B-12  1,000 mcg Oral Daily   Continuous Infusions: PRN Meds:.acetaminophen **OR** [DISCONTINUED] acetaminophen, heparin lock flush, heparin lock flush, ondansetron **OR** ondansetron (ZOFRAN) IV, oxyCODONE, senna-docusate, sodium chloride flush, sodium chloride flush  Diet Orders (From admission, onward)    Start     Ordered   03/05/20 1823  Diet regular Room service appropriate? Yes; Fluid consistency: Thin  Diet effective now       Question Answer Comment  Room service appropriate? Yes   Fluid consistency: Thin      03/05/20 1822          DVT prophylaxis: heparin injection 5,000 Units Start: 03/04/20 2200     Code Status: DNR  Family Communication: no family at bedside, discussed with daughter over the phone  Status is: Inpatient  Remains inpatient appropriate because:Inpatient level of care appropriate due to severity of illness   Dispo: The  patient is from: Home              Anticipated d/c is to: Home              Anticipated d/c date is: 1 day              Patient currently is not medically stable to d/c.   Consultants:  None   Procedures:  None   Microbiology  None   Antimicrobials: None     Objective: Vitals:   03/10/20 1442 03/10/20 1933 03/11/20  0351 03/11/20 0500  BP: 117/63 114/64 114/63   Pulse: 75 73 71   Resp: 16 15 15    Temp: 97.9 F (36.6 C) 98.3 F (36.8 C) 98.7 F (37.1 C)   TempSrc: Oral Oral Oral   SpO2: 97% 98% 99%   Weight:    83.1 kg  Height:        Intake/Output Summary (Last 24 hours) at 03/11/2020 1226 Last data filed at 03/11/2020 0600 Gross per 24 hour  Intake 1244 ml  Output 800 ml  Net 444 ml   Filed Weights   03/04/20 2009 03/11/20 0500  Weight: 78.2 kg 83.1 kg    Examination:  Constitutional: No distress, in bed Eyes: No scleral icterus ENMT: Moist mucous membranes Neck: normal, supple Respiratory: Diminished at the bases but overall clear without wheezing or crackles Cardiovascular: Regular rate and rhythm, trace edema Abdomen: Nondistended, bowel sounds positive Musculoskeletal: no clubbing / cyanosis.  Skin: No rashes appreciated Neurologic: Nonfocal   Data Reviewed: I have independently reviewed following labs and imaging studies   CBC: Recent Labs  Lab 03/04/20 1404 03/05/20 0621 03/06/20 1023 03/07/20 0411 03/08/20 0359 03/09/20 0430 03/11/20 0357  WBC 6.3   < > 7.9 8.1 6.8 6.5 6.8  NEUTROABS 4.5  --   --   --   --   --   --   HGB 7.5*   < > 10.3* 9.5* 9.4* 9.3* 9.1*  HCT 25.4*   < > 33.0* 30.5* 30.6* 30.8* 30.4*  MCV 102.8*   < > 97.9 98.1 100.3* 100.7* 103.1*  PLT 222   < > 213 205 215 207 197   < > = values in this interval not displayed.   Basic Metabolic Panel: Recent Labs  Lab 03/07/20 0411 03/08/20 0359 03/09/20 0430 03/10/20 0408 03/11/20 0357  NA 137 137 138 137 137  K 4.1 4.0 4.5 3.9 4.1  CL 102 102 103 103 103  CO2 27 28 27 28 25   GLUCOSE 98 108* 122* 116* 110*  BUN 45* 50* 53* 62* 60*  CREATININE 2.93* 2.28* 3.03* 3.01* 2.86*  CALCIUM 7.9* 7.9* 7.9* 7.6* 7.8*  MG  --   --   --  2.4  --    Liver Function Tests: Recent Labs  Lab 03/04/20 1404 03/06/20 1023 03/07/20 0411 03/08/20 0359  AST 15 18 17 17   ALT 7 21 11 11   ALKPHOS 228*  210* 180* 186*  BILITOT 0.5 0.6 0.4 0.7  PROT 6.3* 6.2* 5.4* 5.3*  ALBUMIN 2.8* 3.0* 2.6* 2.8*   Coagulation Profile: No results for input(s): INR, PROTIME in the last 168 hours. HbA1C: No results for input(s): HGBA1C in the last 72 hours. CBG: No results for input(s): GLUCAP in the last 168 hours.  Recent Results (from the past 240 hour(s))  Respiratory Panel by RT PCR (Flu A&B, Covid) - Nasopharyngeal Swab     Status: None   Collection Time: 03/04/20  4:57 PM   Specimen: Nasopharyngeal Swab  Result Value Ref Range Status   SARS Coronavirus 2 by RT PCR NEGATIVE NEGATIVE Final    Comment: (NOTE) SARS-CoV-2 target nucleic acids are NOT DETECTED.  The SARS-CoV-2 RNA is generally detectable in upper respiratoy specimens during the acute phase of infection. The lowest concentration of SARS-CoV-2 viral copies this assay can detect is 131 copies/mL. A negative result does not preclude SARS-Cov-2 infection and should not be used as the sole basis for treatment or other patient management decisions. A negative result may occur with  improper specimen collection/handling, submission of specimen other than nasopharyngeal swab, presence of viral mutation(s) within the areas targeted by this assay, and inadequate number of viral copies (<131 copies/mL). A negative result must be combined with clinical observations, patient history, and epidemiological information. The expected result is Negative.  Fact Sheet for Patients:  PinkCheek.be  Fact Sheet for Healthcare Providers:  GravelBags.it  This test is no t yet approved or cleared by the [place] FDA and  has been authorized for detection and/or diagnosis of SARS-CoV-2 by FDA under an Emergency Use Authorization (EUA). This EUA will remain  in effect (meaning this test can be used) for the duration of the COVID-19 declaration under Section 564(b)(1) of the Act, 21  U.S.C. section 360bbb-3(b)(1), unless the authorization is terminated or revoked sooner.     Influenza A by PCR NEGATIVE NEGATIVE Final   Influenza B by PCR NEGATIVE NEGATIVE Final    Comment: (NOTE) The Xpert Xpress SARS-CoV-2/FLU/RSV assay is intended as an aid in  the diagnosis of influenza from Nasopharyngeal swab specimens and  should not be used as a sole basis for treatment. Nasal washings and  aspirates are unacceptable for Xpert Xpress SARS-CoV-2/FLU/RSV  testing.  Fact Sheet for Patients: PinkCheek.be  Fact Sheet for Healthcare Providers: GravelBags.it  This test is not yet approved or cleared by the [place] FDA and  has been authorized for detection and/or diagnosis of SARS-CoV-2 by  FDA under an Emergency Use Authorization (EUA). This EUA will remain  in effect (meaning this test can be used) for the duration of the  Covid-19 declaration under Section 564(b)(1) of the Act, 21  U.S.C. section 360bbb-3(b)(1), unless the authorization is  terminated or revoked. Performed at [facility], [place] [address], [place],  [address]      Radiology Studies: No results found.  Marzetta Board, MD, PhD [facility]  Between 7 am - 7 pm I am available, please contact me via Amion or Securechat  Between 7 pm - 7 am I am not available, please contact night coverage MD/APP via Amion

## 2020-03-11 NOTE — Progress Notes (Signed)
Occupational Therapy Progress Note   With encouragement patient agreeable to OOB activity. Min cues for hand placement with sit to stand from EOB, supervision for functional ambulation using rolling walker to bathroom and perform toileting, clothing management and supervision level. Improved carry over of safety with body mechanics with transfer into recliner, set up for bathing. Patient improving with functional mobility and self care however still recommend 24/7 supervision due to cognitive impairments, short term memory deficits.    03/11/20 1500  OT Visit Information  Last OT Received On 03/11/20  Assistance Needed +1  PT/OT/SLP Co-Evaluation/Treatment Yes  Reason for Co-Treatment To address functional/ADL transfers;For patient/therapist safety  OT goals addressed during session ADL's and self-care  History of Present Illness HANCEL ION is a 86 y.o. male with a past medical history of metastatic prostate cancer with mets to the bones, chronic systolic CHF, severe osteoarthritis on narcotics, chronic kidney disease stage IIIb, coronary artery disease who was hospitalized about a month or so ago for congestive heart failure.  This was newly diagnosed.  Patient was seen by cardiology.  Patient was on Zytiga for his prostate cancer which was thought to be the reason for his CHF. Comes to Hospital with progressive weakness. He was found to have acute renal failure with a significant rise in creatinine  Precautions  Precautions Fall  Pain Assessment  Pain Assessment No/denies pain  Cognition  Arousal/Alertness Awake/alert  Behavior During Therapy WFL for tasks assessed/performed  Overall Cognitive Status History of cognitive impairments - at baseline  ADL  Overall ADL's  Needs assistance/impaired  Toilet Transfer Supervision/safety;RW;Ambulation;Regular Toilet;Grab bars  Toileting- Clothing Manipulation and Hygiene Supervision/safety;Sitting/lateral lean;Sit to/from stand  Functional  mobility during ADLs Rolling walker;Supervision/safety  General ADL Comments patient requesting privacy for bathing, set up with basin to perform sponge bath in chair. instruct patient to use call light if needing to stand for perianal bathing however patient reports can perform in sitting. chair alarmed and CNA notified pt set up for bathing   Bed Mobility  Overal bed mobility Needs Assistance  Bed Mobility Supine to Sit  Supine to sit Supervision;HOB elevated  General bed mobility comments increased time and encouragement with HOB elevated to come to sitting EOB  Balance  Overall balance assessment Needs assistance  Sitting-balance support Feet supported  Sitting balance-Leahy Scale Good  Standing balance support During functional activity;Bilateral upper extremity supported  Standing balance-Leahy Scale Poor  Standing balance comment reliant on UE support  Transfers  Overall transfer level Needs assistance  Equipment used Rolling walker (2 wheeled)  Transfers Sit to/from Stand  Sit to Stand Supervision  General transfer comment does not require physical assistance to power up to standing, min cue for hand palcement initially with good carry over transferring to recliner  OT - End of Session  Equipment Utilized During Treatment Rolling walker  Activity Tolerance Patient tolerated treatment well  Patient left in chair;with call bell/phone within reach;with chair alarm set  Nurse Communication Mobility status  OT Assessment/Plan  OT Plan Discharge plan remains appropriate  OT Visit Diagnosis Other abnormalities of gait and mobility (R26.89);Muscle weakness (generalized) (M62.81)  OT Frequency (ACUTE ONLY) Min 2X/week  Follow Up Recommendations No OT follow up;Supervision/Assistance - 24 hour  OT Equipment None recommended by OT  AM-PAC OT "6 Clicks" Daily Activity Outcome Measure (Version 2)  Help from another person eating meals? 3  Help from another person taking care of personal  grooming? 3  Help from another person toileting, which includes  using toliet, bedpan, or urinal? 3  Help from another person bathing (including washing, rinsing, drying)? 3  Help from another person to put on and taking off regular upper body clothing? 3  Help from another person to put on and taking off regular lower body clothing? 3  6 Click Score 18  OT Goal Progression  Progress towards OT goals Progressing toward goals  Acute Rehab OT Goals  Patient Stated Goal sponge bathe  OT Goal Formulation With patient  Time For Goal Achievement 03/19/20  Potential to Achieve Goals Good  OT Time Calculation  OT Start Time (ACUTE ONLY) 1343  OT Stop Time (ACUTE ONLY) 1408  OT Time Calculation (min) 25 min  OT General Charges  $OT Visit 1 Visit  OT Treatments  $Self Care/Home Management  8-22 mins   Delbert Phenix OT OT pager: [phone]

## 2020-03-11 NOTE — Progress Notes (Signed)
PT Cancellation Note  Patient Details Name: Tony [name] MRN: [id number] DOB: 12-Oct-1930   Cancelled Treatment:    Reason Eval/Treat Not Completed: Other (comment) (tired). Pt declines therapy at this time, states he is very tired and didn't get much rest last night. Pt requests therapy come back later today; will check back as schedule permits.   Tori Jamerion Cabello PT, DPT 03/11/20, 10:50 AM

## 2020-03-11 NOTE — Progress Notes (Signed)
Nutrition Follow-up  INTERVENTION:   Nepro Shake po TID, each supplement provides 425 kcal and 19 grams protein  MVI daily   NUTRITION DIAGNOSIS:   Increased nutrient needs related to cancer and cancer related treatments as evidenced by estimated needs.  Ongoing.  GOAL:   Patient will meet greater than or equal to 90% of their needs  Progressing.  MONITOR:   PO intake, Supplement acceptance, Labs, Weight trends, Skin, I & O's  ASSESSMENT:   86 y/o male with a history of metastatic prostate cancer (mets to bone), recently diagnosed systolic CHF, severe osteoarthritis on chronic narcotics, CKD stage IIIb and CAD who is admittd with severe progressive weakness, acute renal failure and anemia  Patient currently consuming 100% of meals and is drinking at least 2/3 Nepro ordered. Appetite improving. Diet was changed to regular on 11/5.  Admission weight: 172 lbs. Current weight: 183 lbs.  I/Os: +7.3L since admit UOP: 1100 ml x 24 hrs  Labs reviewed. Medications: :Lasix, Multivitamin with minerals daily, Miralax, Vitamin B-12  Diet Order:   Diet Order            Diet regular Room service appropriate? Yes; Fluid consistency: Thin  Diet effective now                 EDUCATION NEEDS:   No education needs have been identified at this time  Skin:  Skin Assessment: Reviewed RN Assessment  Last BM:  11/3  Height:   Ht Readings from Last 1 Encounters:  03/04/20 5\' 7"  (1.702 m)    Weight:   Wt Readings from Last 1 Encounters:  03/11/20 83.1 kg   BMI:  Body mass index is 28.69 kg/m.  Estimated Nutritional Needs:   Kcal:  1700-2000kcal/ay  Protein:  80-90g/day  Fluid:  >1.7L/day  Clayton Bibles, MS, RD, LDN Inpatient Clinical Dietitian Contact information available via Amion

## 2020-03-12 LAB — BASIC METABOLIC PANEL
Anion gap: 7 (ref 5–15)
BUN: 60 mg/dL — ABNORMAL HIGH (ref 8–23)
CO2: 27 mmol/L (ref 22–32)
Calcium: 8 mg/dL — ABNORMAL LOW (ref 8.9–10.3)
Chloride: 105 mmol/L (ref 98–111)
Creatinine, Ser: 2.83 mg/dL — ABNORMAL HIGH (ref 0.61–1.24)
GFR, Estimated: 21 mL/min — ABNORMAL LOW (ref >60)
Glucose, Bld: 107 mg/dL — ABNORMAL HIGH (ref 70–99)
Potassium: 4.6 mmol/L (ref 3.5–5.1)
Sodium: 139 mmol/L (ref 135–145)

## 2020-03-12 LAB — CBC
HCT: 30.5 % — ABNORMAL LOW (ref 39.0–52.0)
Hemoglobin: 9.2 g/dL — ABNORMAL LOW (ref 13.0–17.0)
MCH: 30.5 pg (ref 26.0–34.0)
MCHC: 30.2 g/dL (ref 30.0–36.0)
MCV: 101 fL — ABNORMAL HIGH (ref 80.0–100.0)
Platelets: 197 10*3/uL (ref 150–400)
RBC: 3.02 MIL/uL — ABNORMAL LOW (ref 4.22–5.81)
RDW: 18.4 % — ABNORMAL HIGH (ref 11.5–15.5)
WBC: 7 10*3/uL (ref 4.0–10.5)
nRBC: 0.3 % — ABNORMAL HIGH (ref 0.0–0.2)

## 2020-03-12 MED ORDER — NEPRO/CARBSTEADY PO LIQD: 237.0000 mL | Freq: Three times a day (TID) | ORAL | 0 refills | Status: DC

## 2020-03-12 NOTE — Progress Notes (Signed)
Went over discharge instructions w/ patient and his daughter [name]. Patient and daughter verbalized understanding.

## 2020-03-12 NOTE — Discharge Summary (Signed)
Physician Discharge Summary  Tony [name] HEN:[id number] DOB: June 01, 1930 DOA: 03/04/2020  PCP: [name], MD  Admit date: 03/04/2020 Discharge date: 03/12/2020  Admitted From: home Disposition:  home  Recommendations for Outpatient Follow-up:  1. Follow up with PCP in 1-2 weeks 2. Please obtain BMP/CBC in one week  Home Health: PT Equipment/Devices: walker  Discharge Condition: stable CODE STATUS: DNR Diet recommendation: regular  HPI: Per admitting MD, Tony [name] is a 86 y.o. male with a past medical history of metastatic prostate cancer with mets to the bones, chronic systolic CHF, severe osteoarthritis on narcotics, chronic kidney disease stage IIIb, coronary artery disease who was hospitalized about a month or so ago for congestive heart failure.  This was newly diagnosed.  Patient was seen by cardiology.  Patient was on Zytiga for his prostate cancer which was thought to be the reason for his CHF.  The medication was discontinued.  Patient has been placed on Lasix beta-blocker BiDil.  He was not given ACE inhibitor or ARB due to his chronic kidney disease.  His EF is known to be about 35 to 40%. He had significant lower extremity edema associated with a CHF last month which has significantly improved.  Patient has been taking his medications daily.  Over the last few days he has noticed that he is becoming more more weak.  He denies any nausea or vomiting or diarrhea.  No fever or chills.  No sick contacts.  No other acute illness.  Patient underwent blood work this morning at oncology.  He also underwent a PET scan.  He was found to have acute renal failure with a significant rise in creatinine.  He does mention that he has had dark urine.  He has been passing urine and last urinated about an hour or so ago.  He denies any shortness of breath or chest pain. In the emergency department patient was noted to have worsening renal function.  He was also noted to have a lower  hemoglobin than baseline.  Patient denies any active bleeding.  He has had blood transfusions previously.  He will be brought into the hospital for further management.  Hospital Course / Discharge diagnoses: Assessment & Plan: Principal Problem Acute kidney injury on chronic kidney disease stage IIIb-Baseline creatinine appears to be approximately 2, his creatinine was elevated and initially received IV fluids, however became rapidly fluid overloaded.  His Lasix was resumed, his fluid overload has resolved and his creatinine has stabilized around 2.8 which may be his new baseline.  Patient will follow up with nephrology as an outpatient.  Active Problems Acute metabolic encephalopathy, possible progressive vascular dementia -Underwent an MRI of the brain showed evidence of severe small vessel disease and multiple lacunar infarcts, definitely contributing to his late mental status decline.  TSH normal, ammonia not significantly elevated, RPR negative, B12 folate unremarkable. Macrocytic anemia -Likely due to renal disease as well as underlying metastatic cancer, he was 7.5 on admission, received units of packed red blood cells, his hemoglobin has improved appropriately and has remained stable, 9.2 on discharge.  There is no evidence of bleeding Metastatic prostate cancer -Follows with oncology as an outpatient Recently diagnosed chronic systolic CHF -EF 36-14%, continue Coreg, BiDil, furosemide.  He apparently transitioned very rapidly from euvolemia to volume overload over less than a day in the hospital.  Needs daily weights at home, this was discussed with the daughter. Severe osteoarthritis, chronic pain -Continue chronic regimen with fentanyl patch, oxycodone Generalized  weakness, physical deconditioning -physical therapy arranged on discharge  Discharge Instructions  Discharge Instructions    Care order/instruction   Complete by: As directed    Transfuse Parameters     Allergies as of  03/12/2020      Reactions   Gabapentin Other (See Comments)   Patient states caused hallucinations       Medication List    TAKE these medications   aspirin EC 81 MG tablet Take 1 tablet (81 mg total) by mouth daily.   BiDil 20-37.5 MG tablet Generic drug: isosorbide-hydrALAZINE TAKE 1 TABLET BY MOUTH TWICE DAILY   calcium-vitamin D 500-200 MG-UNIT tablet Commonly known as: Oscal 500/200 D-3 Take 1 tablet by mouth daily with breakfast.   carvedilol 12.5 MG tablet Commonly known as: COREG Take 1 tablet (12.5 mg total) by mouth 2 (two) times daily with a meal.   feeding supplement (NEPRO CARB STEADY) Liqd Take 237 mLs by mouth 3 (three) times daily between meals.   fentaNYL 25 MCG/HR Commonly known as: Stephenson 1 patch onto the skin every 3 (three) days.   furosemide 40 MG tablet Commonly known as: LASIX Take 1 tablet (40 mg total) by mouth daily. Make take one-half additional tablet 20 mg by mouth as needed for swelling or weight gain of 3 pounds or more in a day or 5 pounds or more in a week.   MULTI COMPLETE PO Take 1 tablet by mouth daily.   nitroGLYCERIN 0.4 MG SL tablet Commonly known as: NITROSTAT Place 1 tablet (0.4 mg total) under the tongue every 5 (five) minutes as needed for chest pain (3 maximum before seeking care).   Omega-3 1000 MG Caps Take 1 capsule by mouth daily.   oxyCODONE 5 MG immediate release tablet Commonly known as: Oxy IR/ROXICODONE Take 1 tablet (5 mg total) by mouth every 4 (four) hours as needed for severe pain.   polyethylene glycol 17 g packet Commonly known as: MiraLax Take 17 g by mouth daily. What changed:   when to take this  reasons to take this   predniSONE 5 MG tablet Commonly known as: DELTASONE Take 1 tablet (5 mg total) by mouth daily with breakfast.   senna-docusate 8.6-50 MG tablet Commonly known as: Senna S Take 2 tablets by mouth 2 (two) times daily. What changed:   when to take this  reasons to  take this   tamsulosin 0.4 MG Caps capsule Commonly known as: FLOMAX TAKE 1 CAPSULE BY MOUTH TWICE DAILY   vitamin B-12 1000 MCG tablet Commonly known as: CYANOCOBALAMIN Take 1 tablet (1,000 mcg total) by mouth daily.   Vitamin D (Ergocalciferol) 1.25 MG (50000 UNIT) Caps capsule Commonly known as: DRISDOL Take 1 capsule Monday, Wednesday and Friday.       Follow-up Information    [name], MD. Schedule an appointment as soon as possible for a visit in 1 week(s).   Specialty: Family Medicine Contact information: [place]  [address] [phone]        [name], MD .   Specialties: Cardiology, Radiology, Interventional Cardiology Contact information: [address] [place] [address] [phone]               Consultations:  None   Procedures/Studies:  MR BRAIN WO CONTRAST  Result Date: 03/08/2020 CLINICAL DATA:  Neuro deficit, acute, stroke suspected EXAM: MRI HEAD WITHOUT CONTRAST TECHNIQUE: Multiplanar, multiecho pulse sequences of the brain and surrounding structures were obtained without intravenous  contrast. COMPARISON:  None. FINDINGS: Brain: No diffusion-weighted signal abnormality. Small foci of SWI signal dropout involving the medial left occipital lobe and left pons. No midline shift, ventriculomegaly or extra-axial fluid collection. No mass lesion. Mild cerebral atrophy with ex vacuo dilatation. Advanced chronic microvascular ischemic changes. Chronic lacunar insults involving the corona radiata, left basal ganglia, right thalamus and bilateral cerebellum. Vascular: Normal flow voids. Skull and upper cervical spine: Calvarial heterogeneity. Sinuses/Orbits: Normal orbits. Mild ethmoid sinus mucosal thickening. Clear mastoid air cells. Other: 9 mm left masticator space FLAIR hyperintensity (9:12) with correlative T1 hypointense signal (14:41). IMPRESSION: No acute intracranial process. Advanced chronic  microvascular ischemic changes and multifocal lacunar insults as detailed above. Nonspecific susceptibility artifact involving the medial left occipital lobe and left pons. Postcontrast MRI is recommended for further evaluation if patient can tolerate. Subcentimeter left masticator space FLAIR hyperintensity may reflect prominence of the pterygoid venous plexus however postcontrast MRI is also recommended to exclude metastatic lesion. Calvarial heterogeneity can also be better evaluated on postcontrast imaging. These results will be called to the ordering clinician or representative by the Radiologist Assistant, and communication documented in the PACS or [employer]. Electronically Signed   By: Primitivo Gauze M.D.   On: 03/08/2020 14:58   US Renal  Result Date: 03/04/2020 CLINICAL DATA:  Renal failure EXAM: RENAL / URINARY TRACT ULTRASOUND COMPLETE COMPARISON:  Head CT 03/04/2020 FINDINGS: Right Kidney: Renal measurements: 9.6 x 4.2 x 4.4 cm = volume: 92 mL. Echogenicity is increased. No mass or hydronephrosis visualized. Left Kidney: Renal measurements: 8.8 x 4.3 x 3.9 cm = volume: 77 mL. Echogenicity is increased. No mass or hydronephrosis visualized. Bladder: Appears normal for degree of bladder distention. Other: None. IMPRESSION: Increased echogenicity of the renal parenchyma suggestive of renal disease. Electronically Signed   By: Iven Finn M.D.   On: 03/04/2020 18:16   NM PET (AXUMIN) SKULL BASE TO MID THIGH  Result Date: 03/04/2020 CLINICAL DATA:  Prostate carcinoma with biochemical recurrence. Increasing PSA. Prostate cancer with bone metastasis. Hormonal therapy. PSA equal 46 EXAM: NUCLEAR MEDICINE PET SKULL BASE TO THIGH TECHNIQUE: 8.6 mCi F-18 Fluciclovine was injected intravenously. Full-ring PET imaging was performed from the skull base to thigh after the radiotracer. CT data was obtained and used for attenuation correction and anatomic localization. COMPARISON:  Fluciclovine  PET-CT scan 10/16/2019 FINDINGS: NECK No radiotracer activity in neck lymph nodes. Incidental CT finding: None CHEST No radiotracer accumulation within mediastinal or hilar lymph nodes. No suspicious pulmonary nodules on the CT scan. Incidental CT finding: None ABDOMEN/PELVIS Prostate: Diffuse activity in the prostate gland similar comparison exam with SUV max equal 4.4 compared SUV max equal 4.7. Lymph nodes: No abnormal radiotracer accumulation within pelvic or abdominal nodes. Liver: No evidence of liver metastasis Incidental CT finding: Sigmoid diverticulosis. SKELETON Again demonstrated diffuse sclerotic skeletal metastasis throughout the axillary and appendicular skeleton. Internal fixation of the RIGHT femur. On the background of diffuse sclerotic skeletal metastasis, there are several foci of discrete radiotracer activity not changed from prior. For example lesion in the intertrochanteric region of the proximal left femur SUV max equal 5.5 compared SUV max equal 5.7. Lesion in the mid thoracic spine with SUV max equal 3.7 compared SUV max equal 3.9. No evidence of progressive skeletal metastasis by Fluciclovine PET imaging. IMPRESSION: 1. No evidence prostate cancer progression by the Fluciclovine PET CT imaging. Consider Pylarify (F18 PSMA) PET imaging (available at [facility]) for potential increased specificity/sensitivity. 2. Widespread diffuse sclerotic skeletal metastasis the  majority of which is not radiotracer avid. Several foci discrete radiotracer avidity within the spine and pelvis are similar to comparison exam 3. No evidence of nodal metastasis or visceral metastasis. Electronically Signed   By: Suzy Bouchard M.D.   On: 03/04/2020 16:22      Subjective: - no chest pain, shortness of breath, no abdominal pain, nausea or vomiting.   Discharge Exam: BP 115/64   Pulse 74   Temp 98.7 F (37.1 C) (Oral)   Resp 18   Ht 5\' 7"  (1.702 m)   Wt 83.1 kg   SpO2 98%   BMI 28.69 kg/m    General: Pt is alert, awake, not in acute distress Cardiovascular: RRR, S1/S2 +, no rubs, no gallops Respiratory: CTA bilaterally, no wheezing, no rhonchi Abdominal: Soft, NT, ND, bowel sounds + Extremities: trace edema, no cyanosis    The results of significant diagnostics from this hospitalization (including imaging, microbiology, ancillary and laboratory) are listed below for reference.     Microbiology: Recent Results (from the past 240 hour(s))  Respiratory Panel by RT PCR (Flu A&B, Covid) - Nasopharyngeal Swab     Status: None   Collection Time: 03/04/20  4:57 PM   Specimen: Nasopharyngeal Swab  Result Value Ref Range Status   SARS Coronavirus 2 by RT PCR NEGATIVE NEGATIVE Final    Comment: (NOTE) SARS-CoV-2 target nucleic acids are NOT DETECTED.  The SARS-CoV-2 RNA is generally detectable in upper respiratoy specimens during the acute phase of infection. The lowest concentration of SARS-CoV-2 viral copies this assay can detect is 131 copies/mL. A negative result does not preclude SARS-Cov-2 infection and should not be used as the sole basis for treatment or other patient management decisions. A negative result may occur with  improper specimen collection/handling, submission of specimen other than nasopharyngeal swab, presence of viral mutation(s) within the areas targeted by this assay, and inadequate number of viral copies (<131 copies/mL). A negative result must be combined with clinical observations, patient history, and epidemiological information. The expected result is Negative.  Fact Sheet for Patients:  PinkCheek.be  Fact Sheet for Healthcare Providers:  GravelBags.it  This test is no t yet approved or cleared by the [place] FDA and  has been authorized for detection and/or diagnosis of SARS-CoV-2 by FDA under an Emergency Use Authorization (EUA). This EUA will remain  in effect (meaning this  test can be used) for the duration of the COVID-19 declaration under Section 564(b)(1) of the Act, 21 U.S.C. section 360bbb-3(b)(1), unless the authorization is terminated or revoked sooner.     Influenza A by PCR NEGATIVE NEGATIVE Final   Influenza B by PCR NEGATIVE NEGATIVE Final    Comment: (NOTE) The Xpert Xpress SARS-CoV-2/FLU/RSV assay is intended as an aid in  the diagnosis of influenza from Nasopharyngeal swab specimens and  should not be used as a sole basis for treatment. Nasal washings and  aspirates are unacceptable for Xpert Xpress SARS-CoV-2/FLU/RSV  testing.  Fact Sheet for Patients: PinkCheek.be  Fact Sheet for Healthcare Providers: GravelBags.it  This test is not yet approved or cleared by the [place] FDA and  has been authorized for detection and/or diagnosis of SARS-CoV-2 by  FDA under an Emergency Use Authorization (EUA). This EUA will remain  in effect (meaning this test can be used) for the duration of the  Covid-19 declaration under Section 564(b)(1) of the Act, 21  U.S.C. section 360bbb-3(b)(1), unless the authorization is  terminated or revoked. Performed at [facility], [place] [address] [place], [place] [address]      Labs: Basic Metabolic Panel: Recent Labs  Lab 03/08/20 0359 03/09/20 0430 03/10/20 0408 03/11/20 0357 03/12/20 0412  NA 137 138 137 137 139  K 4.0 4.5 3.9 4.1 4.6  CL 102 103 103 103 105  CO2 28 27 28 25 27   GLUCOSE 108* 122* 116* 110* 107*  BUN 50* 53* 62* 60* 60*  CREATININE 2.28* 3.03* 3.01* 2.86* 2.83*  CALCIUM 7.9* 7.9* 7.6* 7.8* 8.0*  MG  --   --  2.4  --   --    Liver Function Tests: Recent Labs  Lab 03/06/20 1023 03/07/20 0411 03/08/20 0359  AST 18 17 17   ALT 21 11 11   ALKPHOS 210* 180* 186*  BILITOT 0.6 0.4 0.7  PROT 6.2* 5.4* 5.3*  ALBUMIN 3.0* 2.6* 2.8*   CBC: Recent Labs  Lab 03/07/20 0411 03/08/20 0359  03/09/20 0430 03/11/20 0357 03/12/20 0412  WBC 8.1 6.8 6.5 6.8 7.0  HGB 9.5* 9.4* 9.3* 9.1* 9.2*  HCT 30.5* 30.6* 30.8* 30.4* 30.5*  MCV 98.1 100.3* 100.7* 103.1* 101.0*  PLT 205 215 207 197 197   CBG: No results for input(s): GLUCAP in the last 168 hours. Hgb A1c No results for input(s): HGBA1C in the last 72 hours. Lipid Profile No results for input(s): CHOL, HDL, LDLCALC, TRIG, CHOLHDL, LDLDIRECT in the last 72 hours. Thyroid function studies No results for input(s): TSH, T4TOTAL, T3FREE, THYROIDAB in the last 72 hours.  Invalid input(s): FREET3 Urinalysis    Component Value Date/Time   COLORURINE YELLOW 01/25/2020 1619   APPEARANCEUR HAZY (A) 01/25/2020 1619   LABSPEC 1.018 01/25/2020 1619   PHURINE 5.0 01/25/2020 1619   GLUCOSEU NEGATIVE 01/25/2020 1619   HGBUR SMALL (A) 01/25/2020 1619   BILIRUBINUR NEGATIVE 01/25/2020 1619   KETONESUR NEGATIVE 01/25/2020 1619   PROTEINUR NEGATIVE 01/25/2020 1619   UROBILINOGEN 1.0 05/06/2012 2022   NITRITE NEGATIVE 01/25/2020 1619   LEUKOCYTESUR MODERATE (A) 01/25/2020 1619    FURTHER DISCHARGE INSTRUCTIONS:   Get Medicines reviewed and adjusted: Please take all your medications with you for your next visit with your Primary MD   Laboratory/radiological data: Please request your Primary MD to go over all hospital tests and procedure/radiological results at the follow up, please ask your Primary MD to get all Hospital records sent to his/her office.   In some cases, they will be blood work, cultures and biopsy results pending at the time of your discharge. Please request that your primary care M.D. goes through all the records of your hospital data and follows up on these results.   Also Note the following: If you experience worsening of your admission symptoms, develop shortness of breath, life threatening emergency, suicidal or homicidal thoughts you must seek medical attention immediately by calling 911 or calling your MD  immediately  if symptoms less severe.   You must read complete instructions/literature along with all the possible adverse reactions/side effects for all the Medicines you take and that have been prescribed to you. Take any new Medicines after you have completely understood and accpet all the possible adverse reactions/side effects.    Do not drive when taking Pain medications or sleeping medications (Benzodaizepines)   Do not take more than prescribed Pain, Sleep and Anxiety Medications. It is not advisable to combine anxiety,sleep and pain medications without talking with your primary care practitioner   Special Instructions: If you have smoked or chewed Tobacco  in the last  2 yrs please stop smoking, stop any regular Alcohol  and or any Recreational drug use.   Wear Seat belts while driving.   Please note: You were cared for by a hospitalist during your hospital stay. Once you are discharged, your primary care physician will handle any further medical issues. Please note that NO REFILLS for any discharge medications will be authorized once you are discharged, as it is imperative that you return to your primary care physician (or establish a relationship with a primary care physician if you do not have one) for your post hospital discharge needs so that they can reassess your need for medications and monitor your lab values.  Time coordinating discharge: 40 minutes  SIGNED:  Marzetta Board, MD, PhD 03/12/2020, 9:55 AM

## 2020-03-12 NOTE — Discharge Instructions (Signed)
Follow with [name], MD in 5-7 days  Please get a complete blood count and chemistry panel checked by your Primary MD at your next visit, and again as instructed by your Primary MD. Please get your medications reviewed and adjusted by your Primary MD.  Please request your Primary MD to go over all Hospital Tests and Procedure/Radiological results at the follow up, please get all Hospital records sent to your Prim MD by signing hospital release before you go home.  In some cases, there will be blood work, cultures and biopsy results pending at the time of your discharge. Please request that your primary care M.D. goes through all the records of your hospital data and follows up on these results.  If you had Pneumonia of Lung problems at the Hospital: Please get a 2 view Chest X ray done in 6-8 weeks after hospital discharge or sooner if instructed by your Primary MD.  If you have Congestive Heart Failure: Please call your Cardiologist or Primary MD anytime you have any of the following symptoms:  1) 3 pound weight gain in 24 hours or 5 pounds in 1 week  2) shortness of breath, with or without a dry hacking cough  3) swelling in the hands, feet or stomach  4) if you have to sleep on extra pillows at night in order to breathe  Follow cardiac low salt diet and 1.5 lit/day fluid restriction.  If you have diabetes Accuchecks 4 times/day, Once in AM empty stomach and then before each meal. Log in all results and show them to your primary doctor at your next visit. If any glucose reading is under 80 or above 300 call your primary MD immediately.  If you have Seizure/Convulsions/Epilepsy: Please do not drive, operate heavy machinery, participate in activities at heights or participate in high speed sports until you have seen by Primary MD or a Neurologist and advised to do so again. Per [facility] statutes, patients with seizures are not allowed to drive until they have been  seizure-free for six months.  Use caution when using heavy equipment or power tools. Avoid working on ladders or at heights. Take showers instead of baths. Ensure the water temperature is not too high on the home water heater. Do not go swimming alone. Do not lock yourself in a room alone (i.e. bathroom). When caring for infants or small children, sit down when holding, feeding, or changing them to minimize risk of injury to the child in the event you have a seizure. Maintain good sleep hygiene. Avoid alcohol.   If you had Gastrointestinal Bleeding: Please ask your Primary MD to check a complete blood count within one week of discharge or at your next visit. Your endoscopic/colonoscopic biopsies that are pending at the time of discharge, will also need to followed by your Primary MD.  Get Medicines reviewed and adjusted. Please take all your medications with you for your next visit with your Primary MD  Please request your Primary MD to go over all hospital tests and procedure/radiological results at the follow up, please ask your Primary MD to get all Hospital records sent to his/her office.  If you experience worsening of your admission symptoms, develop shortness of breath, life threatening emergency, suicidal or homicidal thoughts you must seek medical attention immediately by calling 911 or calling your MD immediately  if symptoms less severe.  You must read complete instructions/literature along with all the possible adverse reactions/side effects for all the Medicines you  take and that have been prescribed to you. Take any new Medicines after you have completely understood and accpet all the possible adverse reactions/side effects.   Do not drive or operate heavy machinery when taking Pain medications.   Do not take more than prescribed Pain, Sleep and Anxiety Medications  Special Instructions: If you have smoked or chewed Tobacco  in the last 2 yrs please stop smoking, stop any regular  Alcohol  and or any Recreational drug use.  Wear Seat belts while driving.  Please note You were cared for by a hospitalist during your hospital stay. If you have any questions about your discharge medications or the care you received while you were in the hospital after you are discharged, you can call the unit and asked to speak with the hospitalist on call if the hospitalist that took care of you is not available. Once you are discharged, your primary care physician will handle any further medical issues. Please note that NO REFILLS for any discharge medications will be authorized once you are discharged, as it is imperative that you return to your primary care physician (or establish a relationship with a primary care physician if you do not have one) for your aftercare needs so that they can reassess your need for medications and monitor your lab values.  You can reach the hospitalist office at phone [phone] or fax [phone]   If you do not have a primary care physician, you can call [phone] for a physician referral.  Activity: As tolerated with Full fall precautions use walker/cane & assistance as needed    Diet: regular  Disposition Home

## 2020-03-15 ENCOUNTER — Telehealth: Payer: Self-pay

## 2020-03-15 NOTE — Telephone Encounter (Signed)
Pt.s daughter is requesting a phone call in regards to requesting more hours with a home health aide.   [phone]

## 2020-03-15 NOTE — Telephone Encounter (Cosign Needed)
Transition Care Management Follow-up Telephone Call  Date of discharge and from where: 03/12/20 From [place] [facility]  How have you been since you were released from the hospital? medical changes related to diagnosis Vascular dementia Any questions or concerns? Yes ,  Items Reviewed:  Did the pt receive and understand the discharge instructions provided? Yes   Medications obtained and verified? Yes   Other? No   Any new allergies since your discharge? No   Dietary orders reviewed? Yes  Do you have support at home? Yes  daughter [facility] and Equipment/Supplies: Were home health services ordered? yes If so, what is the name of the agency? [facility] and was a pt of caring hands until DX change  Were any new equipment or medical supplies ordered?  Yes: PT and Walker and the need of additional PCAS hours What is the name of the medical supply agency?  Were you able to get the supplies/equipment? no Do you have any questions related to the use of the equipment or supplies? No  Functional Questionnaire: (I = Independent and D = Dependent) ADLs: D  Bathing/Dressing- D  Meal Prep- D  Eating- D  Maintaining continence- D  Transferring/Ambulation- D  Managing Meds- D  Follow up appointments reviewed:   PCP Hospital f/u appt confirmed? Yes  Scheduled to see Dr [name] 03/18/20  @ 10:40 a.m.  [facility] f/u appt confirmed? Yes  Scheduled to see Cardiologist on Dec1st 2021  Are transportation arrangements needed? No   If their condition worsens, is the pt aware to call PCP or go to the Emergency Dept.? Yes  Was the patient provided with contact information for the PCP's office or ED? Yes  Was to pt encouraged to call back with questions or concerns? Yes

## 2020-03-16 ENCOUNTER — Telehealth: Payer: Self-pay

## 2020-03-16 ENCOUNTER — Other Ambulatory Visit: Payer: Self-pay | Admitting: Hematology

## 2020-03-16 DIAGNOSIS — C7951 Secondary malignant neoplasm of bone: Secondary | ICD-10-CM

## 2020-03-16 DIAGNOSIS — M6281 Muscle weakness (generalized): Secondary | ICD-10-CM | POA: Diagnosis not present

## 2020-03-16 DIAGNOSIS — I2510 Atherosclerotic heart disease of native coronary artery without angina pectoris: Secondary | ICD-10-CM | POA: Diagnosis not present

## 2020-03-16 DIAGNOSIS — I130 Hypertensive heart and chronic kidney disease with heart failure and stage 1 through stage 4 chronic kidney disease, or unspecified chronic kidney disease: Secondary | ICD-10-CM | POA: Diagnosis not present

## 2020-03-16 DIAGNOSIS — N1832 Chronic kidney disease, stage 3b: Secondary | ICD-10-CM | POA: Diagnosis not present

## 2020-03-16 DIAGNOSIS — I5041 Acute combined systolic (congestive) and diastolic (congestive) heart failure: Secondary | ICD-10-CM | POA: Diagnosis not present

## 2020-03-16 DIAGNOSIS — C61 Malignant neoplasm of prostate: Secondary | ICD-10-CM

## 2020-03-16 MED ORDER — FENTANYL 25 MCG/HR TD PT72: 1.0000 | MEDICATED_PATCH | TRANSDERMAL | 0 refills | Status: DC

## 2020-03-16 NOTE — Telephone Encounter (Signed)
Physical Therapist from [facility] called requesting verbal orders for pt. Requesting OK to resume physical therapy services following recent discharge from hospital. 2 week 1, 1 week 2, and plan to recert.   Also wanted to report a fall that happened on 03/16/2020. Pt went to reach something and lost balance. Pt was found on left knee. No injuries.   Please advise.

## 2020-03-16 NOTE — Telephone Encounter (Signed)
Spoke with patient daughter Notified form was faxed to home health office

## 2020-03-16 NOTE — Telephone Encounter (Signed)
Hi team have we seen any of these forms?  [name]. [name], MD 03/16/2020 11:32 AM

## 2020-03-16 NOTE — Telephone Encounter (Signed)
Form received

## 2020-03-16 NOTE — Telephone Encounter (Signed)
Please review for refill. Dr. Cyndia Skeeters was last prescriber

## 2020-03-16 NOTE — Telephone Encounter (Signed)
Called and lm on [place] vm providing VO.

## 2020-03-18 ENCOUNTER — Telehealth: Payer: Self-pay

## 2020-03-18 ENCOUNTER — Other Ambulatory Visit: Payer: Self-pay

## 2020-03-18 ENCOUNTER — Ambulatory Visit (INDEPENDENT_AMBULATORY_CARE_PROVIDER_SITE_OTHER): Payer: Medicare Other | Admitting: Family Medicine

## 2020-03-18 ENCOUNTER — Encounter: Payer: Self-pay | Admitting: Family Medicine

## 2020-03-18 VITALS — BP 84/52 | HR 91 | Temp 97.3°F | Ht 67.0 in

## 2020-03-18 DIAGNOSIS — F0150 Vascular dementia without behavioral disturbance: Secondary | ICD-10-CM | POA: Diagnosis not present

## 2020-03-18 DIAGNOSIS — I5020 Unspecified systolic (congestive) heart failure: Secondary | ICD-10-CM | POA: Diagnosis not present

## 2020-03-18 DIAGNOSIS — N1832 Chronic kidney disease, stage 3b: Secondary | ICD-10-CM

## 2020-03-18 DIAGNOSIS — C7951 Secondary malignant neoplasm of bone: Secondary | ICD-10-CM | POA: Diagnosis not present

## 2020-03-18 DIAGNOSIS — I2510 Atherosclerotic heart disease of native coronary artery without angina pectoris: Secondary | ICD-10-CM | POA: Diagnosis not present

## 2020-03-18 DIAGNOSIS — I130 Hypertensive heart and chronic kidney disease with heart failure and stage 1 through stage 4 chronic kidney disease, or unspecified chronic kidney disease: Secondary | ICD-10-CM | POA: Diagnosis not present

## 2020-03-18 DIAGNOSIS — I5041 Acute combined systolic (congestive) and diastolic (congestive) heart failure: Secondary | ICD-10-CM | POA: Diagnosis not present

## 2020-03-18 DIAGNOSIS — M6281 Muscle weakness (generalized): Secondary | ICD-10-CM | POA: Diagnosis not present

## 2020-03-18 LAB — GUARDANT 360

## 2020-03-18 MED ORDER — NEPRO PO LIQD: 237.0000 mL | Freq: Three times a day (TID) | ORAL | 5 refills | Status: AC

## 2020-03-18 NOTE — Telephone Encounter (Signed)
Occupational Therapist from [facility] Is requesting verbal orders for  1x a week for 2 weeks Ending with medicare certification reevaluation  Will- [phone] .Marland Kitchen OK to leave voicemail.

## 2020-03-18 NOTE — Assessment & Plan Note (Signed)
No signs of volume overload.  Patient's fluid status very tenuous but appears to be euvolemic today.  We will check CBC and CMET.  We will continue BiDil, Coreg, and Lasix as prescribed.  He follow-up with cardiology in a few weeks.

## 2020-03-18 NOTE — Assessment & Plan Note (Signed)
Stable.  Discussed progressive and degenerative nature of illness with we will continue risk factor modification to limit progression.

## 2020-03-18 NOTE — Patient Instructions (Signed)
It was very nice to see you today!  I will send in your nutritional supplement.  Check blood work today  I will see back in 6 months.  Please come back to see me sooner if needed.  Take care, Dr [name]  Please try these tips to maintain a healthy lifestyle:   Eat at least 3 REAL meals and 1-2 snacks per day.  Aim for no more than 5 hours between eating.  If you eat breakfast, please do so within one hour of getting up.    Each meal should contain half fruits/vegetables, one quarter protein, and one quarter carbs (no bigger than a computer mouse)   Cut down on sweet beverages. This includes juice, soda, and sweet tea.     Drink at least 1 glass of water with each meal and aim for at least 8 glasses per day   Exercise at least 150 minutes every week.

## 2020-03-18 NOTE — Progress Notes (Signed)
   Tony [name] is a 86 y.o. male who presents today for an office visit.  Assessment/Plan:  bChronic Problems Addressed Today: Vascular dementia ([place]) Stable.  Discussed progressive and degenerative nature of illness with we will continue risk factor modification to limit progression.  HFrEF (heart failure with reduced ejection fraction) (HCC) No signs of volume overload.  Patient's fluid status very tenuous but appears to be euvolemic today.  We will check CBC and CMET.  We will continue BiDil, Coreg, and Lasix as prescribed.  He follow-up with cardiology in a few weeks.  Chronic kidney disease, stage 3b ([place]) We will recheck CMET today.  Creatinine 2.8 the time of discharge around this week.  We will continue current dose of Lasix 40 mg daily with extra half dose as needed     Subjective:  HPI:  Patient here for hospital follow-up.  Was admitted on 03/04/2020 with acute renal failure.  A month prior to his most recent addition he was admitted for new onset congestive heart failure.  He was placed on Lasix.  He has noticed increased weakness prior to his most recent admission and had labs done at oncologist which showed acute renal failure.  It was admitted for IV fluids.Unfortunately became  volume overloaded and had to have Lasix restarted.  Patient was discharged on 03/12/2020.  Creatinine at the time of discharge was 2.8.  PT was consulted recommended home health PT.  Since being discharged home, he has done well.  He has been undergoing physical therapy and has been doing well.  Daughter is concerned he has become more deconditioned.  Will sometimes spend all day in bed.  Patient states he is try to get his strength back up after being in the hospital.  He was found to have vascular dementia while in the hospital.       Objective:  Physical Exam: BP (!) 84/52   Pulse 91   Temp (!) 97.3 F (36.3 C) (Temporal)   Ht 5\' 7"  (1.702 m)   SpO2 96%   BMI 28.69 kg/m   Gen: No acute  distress, resting comfortably CV: Regular rate and rhythm with no murmurs appreciated Pulm: Normal work of breathing, clear to auscultation bilaterally with no crackles, wheezes, or rhonchi Neuro: Grossly normal, moves all extremities Psych: Normal affect and thought content  Time Spent: 45 minutes of total time was spent on the date of the encounter performing the following actions: chart review prior to seeing the patient including recent cough, obtaining history, performing a medically necessary exam, counseling on the treatment plan, placing orders, and documenting in our EHR.        [name]. [name], MD 03/18/2020 11:25 AM

## 2020-03-18 NOTE — Assessment & Plan Note (Signed)
We will recheck CMET today.  Creatinine 2.8 the time of discharge around this week.  We will continue current dose of Lasix 40 mg daily with extra half dose as needed

## 2020-03-19 ENCOUNTER — Other Ambulatory Visit: Payer: Self-pay | Admitting: Medical

## 2020-03-19 ENCOUNTER — Telehealth: Payer: Self-pay | Admitting: *Deleted

## 2020-03-19 ENCOUNTER — Telehealth: Payer: Self-pay

## 2020-03-19 DIAGNOSIS — C7951 Secondary malignant neoplasm of bone: Secondary | ICD-10-CM | POA: Diagnosis not present

## 2020-03-19 DIAGNOSIS — I5041 Acute combined systolic (congestive) and diastolic (congestive) heart failure: Secondary | ICD-10-CM | POA: Diagnosis not present

## 2020-03-19 DIAGNOSIS — M179 Osteoarthritis of knee, unspecified: Secondary | ICD-10-CM

## 2020-03-19 DIAGNOSIS — C61 Malignant neoplasm of prostate: Secondary | ICD-10-CM

## 2020-03-19 DIAGNOSIS — R159 Full incontinence of feces: Secondary | ICD-10-CM

## 2020-03-19 DIAGNOSIS — I130 Hypertensive heart and chronic kidney disease with heart failure and stage 1 through stage 4 chronic kidney disease, or unspecified chronic kidney disease: Secondary | ICD-10-CM | POA: Diagnosis not present

## 2020-03-19 DIAGNOSIS — I2510 Atherosclerotic heart disease of native coronary artery without angina pectoris: Secondary | ICD-10-CM | POA: Diagnosis not present

## 2020-03-19 DIAGNOSIS — M1710 Unilateral primary osteoarthritis, unspecified knee: Secondary | ICD-10-CM

## 2020-03-19 DIAGNOSIS — N1832 Chronic kidney disease, stage 3b: Secondary | ICD-10-CM | POA: Diagnosis not present

## 2020-03-19 DIAGNOSIS — M6281 Muscle weakness (generalized): Secondary | ICD-10-CM | POA: Diagnosis not present

## 2020-03-19 LAB — COMPREHENSIVE METABOLIC PANEL
AG Ratio: 1.2 (calc) (ref 1.0–2.5)
ALT: 10 U/L (ref 9–46)
AST: 22 U/L (ref 10–35)
Albumin: 3 g/dL — ABNORMAL LOW (ref 3.6–5.1)
Alkaline phosphatase (APISO): 232 U/L — ABNORMAL HIGH (ref 35–144)
BUN/Creatinine Ratio: 15 (calc) (ref 6–22)
BUN: 50 mg/dL — ABNORMAL HIGH (ref 7–25)
CO2: 22 mmol/L (ref 20–32)
Calcium: 8.7 mg/dL (ref 8.6–10.3)
Chloride: 102 mmol/L (ref 98–110)
Creat: 3.29 mg/dL — ABNORMAL HIGH (ref 0.70–1.11)
Globulin: 2.5 g/dL (calc) (ref 1.9–3.7)
Glucose, Bld: 216 mg/dL — ABNORMAL HIGH (ref 65–99)
Potassium: 4.3 mmol/L (ref 3.5–5.3)
Sodium: 139 mmol/L (ref 135–146)
Total Bilirubin: 0.5 mg/dL (ref 0.2–1.2)
Total Protein: 5.5 g/dL — ABNORMAL LOW (ref 6.1–8.1)

## 2020-03-19 LAB — CBC
HCT: 28.7 % — ABNORMAL LOW (ref 38.5–50.0)
Hemoglobin: 9.3 g/dL — ABNORMAL LOW (ref 13.2–17.1)
MCH: 30.2 pg (ref 27.0–33.0)
MCHC: 32.4 g/dL (ref 32.0–36.0)
MCV: 93.2 fL (ref 80.0–100.0)
MPV: 10.3 fL (ref 7.5–12.5)
Platelets: 184 10*3/uL (ref 140–400)
RBC: 3.08 10*6/uL — ABNORMAL LOW (ref 4.20–5.80)
RDW: 16.2 % — ABNORMAL HIGH (ref 11.0–15.0)
WBC: 8.4 10*3/uL (ref 3.8–10.8)

## 2020-03-19 MED ORDER — FENTANYL 25 MCG/HR TD PT72: 1.0000 | MEDICATED_PATCH | TRANSDERMAL | 0 refills | Status: AC

## 2020-03-19 NOTE — Telephone Encounter (Signed)
Called and spoke with Will and VO provided.

## 2020-03-19 NOTE — Telephone Encounter (Signed)
Pt is starting to have bowel incontinence . Daughter states she forgot to tell Dr. [name] at the appt Please advise

## 2020-03-19 NOTE — Telephone Encounter (Signed)
Pt.'s daughter wants Dr. [name] to know that pt has an appointment on [facility] on Wednesday. [facility] is requesting that we send his labs done at our office to them.   Pt.'s daughter is requesting a referral to ortho for the knee pain that the pt has been having.  Pt is starting to have bowel incontinence . Daughter states she forgot to tell Dr. [name] at the appt.

## 2020-03-19 NOTE — Telephone Encounter (Signed)
Can you print and fax labs? I have placed the referral.

## 2020-03-19 NOTE — Telephone Encounter (Signed)
Daughter called -- Pharmacy did not receive prescription for Fentanyl Patch. Prescription sent on 11/16 states electronic failure.  Patient will be out of medication over weekend. Prescription re-ordered by V. Tanner, PA in Dr. Grier Mitts absence. Daughter states father missed dose of Xgeva while in hospital.She asked when father is to restart it and [place]. She also asked  about results of Guardant 360 test. Advised her that Dr. [name] will be sent a message with these questions. Call message routed to Dr. [name].

## 2020-03-22 ENCOUNTER — Telehealth: Payer: Self-pay

## 2020-03-22 DIAGNOSIS — N1832 Chronic kidney disease, stage 3b: Secondary | ICD-10-CM | POA: Diagnosis not present

## 2020-03-22 DIAGNOSIS — C7951 Secondary malignant neoplasm of bone: Secondary | ICD-10-CM | POA: Diagnosis not present

## 2020-03-22 DIAGNOSIS — I130 Hypertensive heart and chronic kidney disease with heart failure and stage 1 through stage 4 chronic kidney disease, or unspecified chronic kidney disease: Secondary | ICD-10-CM | POA: Diagnosis not present

## 2020-03-22 DIAGNOSIS — I2510 Atherosclerotic heart disease of native coronary artery without angina pectoris: Secondary | ICD-10-CM | POA: Diagnosis not present

## 2020-03-22 DIAGNOSIS — I5041 Acute combined systolic (congestive) and diastolic (congestive) heart failure: Secondary | ICD-10-CM | POA: Diagnosis not present

## 2020-03-22 DIAGNOSIS — M6281 Muscle weakness (generalized): Secondary | ICD-10-CM | POA: Diagnosis not present

## 2020-03-22 NOTE — Addendum Note (Signed)
Addended by: Clyde Lundborg A on: 03/22/2020 09:36 AM   Modules accepted: Orders

## 2020-03-22 NOTE — Telephone Encounter (Signed)
Referrals placed, called and lm on vm making them aware.

## 2020-03-22 NOTE — Progress Notes (Signed)
Please inform patient of the following:  Kidney numbers up a small bit. Everything else is stable. Do not need to make any changes to his treatment plan at this time.  Tony [name]. [name], MD 03/22/2020 1:22 PM

## 2020-03-22 NOTE — Telephone Encounter (Signed)
On 11/19 patient was seen by [facility] and was on the floor when arrived patient states he was getting phone charger but [facility] has to treat like a fall so they were calling just to give documentation of incident

## 2020-03-22 NOTE — Telephone Encounter (Signed)
Recommend GI referral.  Tony [name]. [name], MD 03/22/2020 9:15 AM

## 2020-03-23 ENCOUNTER — Telehealth: Payer: Self-pay

## 2020-03-23 ENCOUNTER — Telehealth: Payer: Self-pay | Admitting: *Deleted

## 2020-03-23 ENCOUNTER — Telehealth: Payer: Self-pay | Admitting: Hematology

## 2020-03-23 DIAGNOSIS — I130 Hypertensive heart and chronic kidney disease with heart failure and stage 1 through stage 4 chronic kidney disease, or unspecified chronic kidney disease: Secondary | ICD-10-CM | POA: Diagnosis not present

## 2020-03-23 DIAGNOSIS — C7951 Secondary malignant neoplasm of bone: Secondary | ICD-10-CM | POA: Diagnosis not present

## 2020-03-23 DIAGNOSIS — M6281 Muscle weakness (generalized): Secondary | ICD-10-CM | POA: Diagnosis not present

## 2020-03-23 DIAGNOSIS — I2510 Atherosclerotic heart disease of native coronary artery without angina pectoris: Secondary | ICD-10-CM | POA: Diagnosis not present

## 2020-03-23 DIAGNOSIS — N1832 Chronic kidney disease, stage 3b: Secondary | ICD-10-CM | POA: Diagnosis not present

## 2020-03-23 DIAGNOSIS — I5041 Acute combined systolic (congestive) and diastolic (congestive) heart failure: Secondary | ICD-10-CM | POA: Diagnosis not present

## 2020-03-23 NOTE — Telephone Encounter (Signed)
Scheduled appts per 11/23 sch msg. Pt's daughter confirmed appt date and time.

## 2020-03-23 NOTE — Telephone Encounter (Signed)
Patient is calling in asking for an update about the labs results, states she never got a returned phone call. [name] is also asking why there was a referral placed for a GI doctor when they havent even discussed anything with Dr.Paker. Also asked if the labs be sent to [facility] before his appointment tomorrow.

## 2020-03-23 NOTE — Telephone Encounter (Signed)
Daughter called - Fatigue is greater daily. Some days not getting out of bed - or taking medication - or eating. Daughter reports he's also having delusions. He is incontinent of bladder and bowel at times. He missed dose of Xgeva while in hospital. She asked when father is to restart it and [place]. She also asked about results of Guardant 360 test. Contacted daughter with information from Dr. [name]: Set him up for repeat labs and PRBC transfusion this week...likely is getting anemic again. Guardant 360 no real targetable mutation-- we could try to get him in sooner than 12/7 appt -- schedule early next week to define plan of care going ahead. Advised daughter that scheduling will contact her to schedule appts - she verbalized understanding of all information

## 2020-03-23 NOTE — Telephone Encounter (Signed)
Noted  

## 2020-03-23 NOTE — Telephone Encounter (Signed)
Tony [name] reviewed results with pt.

## 2020-03-23 NOTE — Telephone Encounter (Signed)
See below

## 2020-03-24 ENCOUNTER — Other Ambulatory Visit: Payer: Self-pay

## 2020-03-24 ENCOUNTER — Telehealth: Payer: Self-pay | Admitting: *Deleted

## 2020-03-24 ENCOUNTER — Emergency Department (HOSPITAL_COMMUNITY): Payer: Medicare Other

## 2020-03-24 ENCOUNTER — Other Ambulatory Visit: Payer: Self-pay | Admitting: *Deleted

## 2020-03-24 ENCOUNTER — Inpatient Hospital Stay: Payer: Medicare Other

## 2020-03-24 ENCOUNTER — Encounter (HOSPITAL_COMMUNITY): Payer: Self-pay

## 2020-03-24 ENCOUNTER — Inpatient Hospital Stay (HOSPITAL_COMMUNITY)
Admission: EM | Admit: 2020-03-24 | Discharge: 2020-03-29 | DRG: 682 | Disposition: A | Discharge status: Home Health | Payer: Medicare Other | Attending: Internal Medicine | Admitting: Internal Medicine

## 2020-03-24 DIAGNOSIS — Z20822 Contact with and (suspected) exposure to covid-19: Secondary | ICD-10-CM | POA: Diagnosis present

## 2020-03-24 DIAGNOSIS — E785 Hyperlipidemia, unspecified: Secondary | ICD-10-CM | POA: Diagnosis not present

## 2020-03-24 DIAGNOSIS — M170 Bilateral primary osteoarthritis of knee: Secondary | ICD-10-CM | POA: Diagnosis present

## 2020-03-24 DIAGNOSIS — I5020 Unspecified systolic (congestive) heart failure: Secondary | ICD-10-CM | POA: Diagnosis present

## 2020-03-24 DIAGNOSIS — Z955 Presence of coronary angioplasty implant and graft: Secondary | ICD-10-CM

## 2020-03-24 DIAGNOSIS — N401 Enlarged prostate with lower urinary tract symptoms: Secondary | ICD-10-CM | POA: Diagnosis not present

## 2020-03-24 DIAGNOSIS — N1832 Chronic kidney disease, stage 3b: Secondary | ICD-10-CM | POA: Diagnosis present

## 2020-03-24 DIAGNOSIS — I130 Hypertensive heart and chronic kidney disease with heart failure and stage 1 through stage 4 chronic kidney disease, or unspecified chronic kidney disease: Secondary | ICD-10-CM | POA: Diagnosis present

## 2020-03-24 DIAGNOSIS — Z515 Encounter for palliative care: Secondary | ICD-10-CM | POA: Diagnosis not present

## 2020-03-24 DIAGNOSIS — G9341 Metabolic encephalopathy: Secondary | ICD-10-CM | POA: Diagnosis present

## 2020-03-24 DIAGNOSIS — Z8673 Personal history of transient ischemic attack (TIA), and cerebral infarction without residual deficits: Secondary | ICD-10-CM | POA: Diagnosis not present

## 2020-03-24 DIAGNOSIS — R5381 Other malaise: Secondary | ICD-10-CM | POA: Diagnosis present

## 2020-03-24 DIAGNOSIS — Z87891 Personal history of nicotine dependence: Secondary | ICD-10-CM

## 2020-03-24 DIAGNOSIS — Z66 Do not resuscitate: Secondary | ICD-10-CM | POA: Diagnosis present

## 2020-03-24 DIAGNOSIS — R159 Full incontinence of feces: Secondary | ICD-10-CM | POA: Diagnosis present

## 2020-03-24 DIAGNOSIS — I129 Hypertensive chronic kidney disease with stage 1 through stage 4 chronic kidney disease, or unspecified chronic kidney disease: Secondary | ICD-10-CM | POA: Diagnosis not present

## 2020-03-24 DIAGNOSIS — I10 Essential (primary) hypertension: Secondary | ICD-10-CM | POA: Diagnosis present

## 2020-03-24 DIAGNOSIS — E860 Dehydration: Secondary | ICD-10-CM | POA: Diagnosis present

## 2020-03-24 DIAGNOSIS — Z7189 Other specified counseling: Secondary | ICD-10-CM | POA: Diagnosis not present

## 2020-03-24 DIAGNOSIS — I517 Cardiomegaly: Secondary | ICD-10-CM | POA: Diagnosis not present

## 2020-03-24 DIAGNOSIS — C61 Malignant neoplasm of prostate: Secondary | ICD-10-CM | POA: Diagnosis not present

## 2020-03-24 DIAGNOSIS — N189 Chronic kidney disease, unspecified: Secondary | ICD-10-CM | POA: Diagnosis present

## 2020-03-24 DIAGNOSIS — Z7952 Long term (current) use of systemic steroids: Secondary | ICD-10-CM

## 2020-03-24 DIAGNOSIS — R410 Disorientation, unspecified: Secondary | ICD-10-CM | POA: Diagnosis not present

## 2020-03-24 DIAGNOSIS — Z79891 Long term (current) use of opiate analgesic: Secondary | ICD-10-CM | POA: Diagnosis not present

## 2020-03-24 DIAGNOSIS — I2510 Atherosclerotic heart disease of native coronary artery without angina pectoris: Secondary | ICD-10-CM | POA: Diagnosis present

## 2020-03-24 DIAGNOSIS — Z7401 Bed confinement status: Secondary | ICD-10-CM

## 2020-03-24 DIAGNOSIS — Z7982 Long term (current) use of aspirin: Secondary | ICD-10-CM | POA: Diagnosis not present

## 2020-03-24 DIAGNOSIS — I5022 Chronic systolic (congestive) heart failure: Secondary | ICD-10-CM | POA: Diagnosis present

## 2020-03-24 DIAGNOSIS — R531 Weakness: Secondary | ICD-10-CM | POA: Diagnosis not present

## 2020-03-24 DIAGNOSIS — Z79899 Other long term (current) drug therapy: Secondary | ICD-10-CM

## 2020-03-24 DIAGNOSIS — R32 Unspecified urinary incontinence: Secondary | ICD-10-CM | POA: Diagnosis present

## 2020-03-24 DIAGNOSIS — C7951 Secondary malignant neoplasm of bone: Secondary | ICD-10-CM | POA: Diagnosis present

## 2020-03-24 DIAGNOSIS — Z8042 Family history of malignant neoplasm of prostate: Secondary | ICD-10-CM

## 2020-03-24 DIAGNOSIS — D539 Nutritional anemia, unspecified: Secondary | ICD-10-CM | POA: Diagnosis present

## 2020-03-24 DIAGNOSIS — G8929 Other chronic pain: Secondary | ICD-10-CM | POA: Diagnosis present

## 2020-03-24 DIAGNOSIS — D631 Anemia in chronic kidney disease: Secondary | ICD-10-CM | POA: Diagnosis present

## 2020-03-24 DIAGNOSIS — N179 Acute kidney failure, unspecified: Secondary | ICD-10-CM | POA: Diagnosis not present

## 2020-03-24 DIAGNOSIS — R351 Nocturia: Secondary | ICD-10-CM

## 2020-03-24 DIAGNOSIS — F0150 Vascular dementia without behavioral disturbance: Secondary | ICD-10-CM | POA: Diagnosis not present

## 2020-03-24 DIAGNOSIS — F05 Delirium due to known physiological condition: Secondary | ICD-10-CM | POA: Diagnosis present

## 2020-03-24 DIAGNOSIS — R5383 Other fatigue: Secondary | ICD-10-CM | POA: Diagnosis not present

## 2020-03-24 DIAGNOSIS — N184 Chronic kidney disease, stage 4 (severe): Secondary | ICD-10-CM | POA: Diagnosis not present

## 2020-03-24 LAB — CMP (CANCER CENTER ONLY)
ALT: 9 U/L (ref 0–44)
AST: 15 U/L (ref 15–41)
Albumin: 2.6 g/dL — ABNORMAL LOW (ref 3.5–5.0)
Alkaline Phosphatase: 224 U/L — ABNORMAL HIGH (ref 38–126)
Anion gap: 12 (ref 5–15)
BUN: 66 mg/dL — ABNORMAL HIGH (ref 8–23)
CO2: 26 mmol/L (ref 22–32)
Calcium: 8.8 mg/dL — ABNORMAL LOW (ref 8.9–10.3)
Chloride: 102 mmol/L (ref 98–111)
Creatinine: 4 mg/dL (ref 0.61–1.24)
GFR, Estimated: 14 mL/min — ABNORMAL LOW (ref >60)
Glucose, Bld: 123 mg/dL — ABNORMAL HIGH (ref 70–99)
Potassium: 4.2 mmol/L (ref 3.5–5.1)
Sodium: 140 mmol/L (ref 135–145)
Total Bilirubin: 0.5 mg/dL (ref 0.3–1.2)
Total Protein: 6.7 g/dL (ref 6.5–8.1)

## 2020-03-24 LAB — URINALYSIS, ROUTINE W REFLEX MICROSCOPIC
Bacteria, UA: NONE SEEN
Bilirubin Urine: NEGATIVE
Glucose, UA: NEGATIVE mg/dL
Ketones, ur: NEGATIVE mg/dL
Leukocytes,Ua: NEGATIVE
Nitrite: NEGATIVE
Protein, ur: NEGATIVE mg/dL
Specific Gravity, Urine: 1.011 (ref 1.005–1.030)
pH: 5 (ref 5.0–8.0)

## 2020-03-24 LAB — CBC WITH DIFFERENTIAL (CANCER CENTER ONLY)
Abs Immature Granulocytes: 0.08 10*3/uL — ABNORMAL HIGH (ref 0.00–0.07)
Basophils Absolute: 0 10*3/uL (ref 0.0–0.1)
Basophils Relative: 0 %
Eosinophils Absolute: 0 10*3/uL (ref 0.0–0.5)
Eosinophils Relative: 0 %
HCT: 28.9 % — ABNORMAL LOW (ref 39.0–52.0)
Hemoglobin: 8.9 g/dL — ABNORMAL LOW (ref 13.0–17.0)
Immature Granulocytes: 1 %
Lymphocytes Relative: 15 %
Lymphs Abs: 1.2 10*3/uL (ref 0.7–4.0)
MCH: 29.6 pg (ref 26.0–34.0)
MCHC: 30.8 g/dL (ref 30.0–36.0)
MCV: 96 fL (ref 80.0–100.0)
Monocytes Absolute: 0.6 10*3/uL (ref 0.1–1.0)
Monocytes Relative: 8 %
Neutro Abs: 5.7 10*3/uL (ref 1.7–7.7)
Neutrophils Relative %: 76 %
Platelet Count: 210 10*3/uL (ref 150–400)
RBC: 3.01 MIL/uL — ABNORMAL LOW (ref 4.22–5.81)
RDW: 18.4 % — ABNORMAL HIGH (ref 11.5–15.5)
WBC Count: 7.6 10*3/uL (ref 4.0–10.5)
nRBC: 0 % (ref 0.0–0.2)

## 2020-03-24 LAB — CREATININE, URINE, RANDOM: Creatinine, Urine: 118.14 mg/dL

## 2020-03-24 LAB — MAGNESIUM: Magnesium: 2.5 mg/dL — ABNORMAL HIGH (ref 1.7–2.4)

## 2020-03-24 LAB — PHOSPHORUS: Phosphorus: 1.4 mg/dL — ABNORMAL LOW (ref 2.5–4.6)

## 2020-03-24 LAB — SAMPLE TO BLOOD BANK

## 2020-03-24 LAB — AMMONIA: Ammonia: 24 umol/L (ref 9–35)

## 2020-03-24 LAB — RESP PANEL BY RT-PCR (FLU A&B, COVID) ARPGX2
Influenza A by PCR: NEGATIVE
Influenza B by PCR: NEGATIVE
SARS Coronavirus 2 by RT PCR: NEGATIVE

## 2020-03-24 LAB — CK: Total CK: 52 U/L (ref 49–397)

## 2020-03-24 LAB — BRAIN NATRIURETIC PEPTIDE: B Natriuretic Peptide: 722.9 pg/mL — ABNORMAL HIGH (ref 0.0–100.0)

## 2020-03-24 LAB — SODIUM, URINE, RANDOM: Sodium, Ur: 39 mmol/L

## 2020-03-24 MED ORDER — TAMSULOSIN HCL 0.4 MG PO CAPS
0.4000 mg | ORAL_CAPSULE | Freq: Two times a day (BID) | ORAL | Status: DC
  Administered 2020-03-24 – 2020-03-29 (×10): 0.4 mg via ORAL
  Filled 2020-03-24 (×10): qty 1

## 2020-03-24 MED ORDER — HYDROCODONE-ACETAMINOPHEN 5-325 MG PO TABS: 1.0000 | ORAL_TABLET | ORAL | Status: DC | PRN

## 2020-03-24 MED ORDER — ASPIRIN EC 81 MG PO TBEC
81.0000 mg | DELAYED_RELEASE_TABLET | Freq: Every day | ORAL | Status: DC
  Administered 2020-03-25 – 2020-03-29 (×5): 81 mg via ORAL
  Filled 2020-03-24 (×5): qty 1

## 2020-03-24 MED ORDER — SENNA 8.6 MG PO TABS
1.0000 | ORAL_TABLET | Freq: Two times a day (BID) | ORAL | Status: DC
  Administered 2020-03-24 – 2020-03-28 (×8): 8.6 mg via ORAL
  Filled 2020-03-24 (×10): qty 1

## 2020-03-24 MED ORDER — HEPARIN SODIUM (PORCINE) 5000 UNIT/ML IJ SOLN
5000.0000 [IU] | Freq: Three times a day (TID) | INTRAMUSCULAR | Status: DC
  Administered 2020-03-25 (×2): 5000 [IU] via SUBCUTANEOUS
  Filled 2020-03-24 (×2): qty 1

## 2020-03-24 MED ORDER — SODIUM CHLORIDE 0.9 % IV SOLN
75.0000 mL/h | INTRAVENOUS | Status: AC
  Administered 2020-03-24: 75 mL/h via INTRAVENOUS

## 2020-03-24 MED ORDER — PREDNISONE 5 MG PO TABS
5.0000 mg | ORAL_TABLET | Freq: Every day | ORAL | Status: DC
  Administered 2020-03-25 – 2020-03-29 (×5): 5 mg via ORAL
  Filled 2020-03-24 (×5): qty 1

## 2020-03-24 MED ORDER — SODIUM CHLORIDE 0.9 % IV BOLUS
500.0000 mL | Freq: Once | INTRAVENOUS | Status: AC
  Administered 2020-03-24: 500 mL via INTRAVENOUS

## 2020-03-24 MED ORDER — ACETAMINOPHEN 325 MG PO TABS: 650.0000 mg | ORAL_TABLET | Freq: Four times a day (QID) | ORAL | Status: DC | PRN

## 2020-03-24 MED ORDER — ACETAMINOPHEN 650 MG RE SUPP: 650.0000 mg | Freq: Four times a day (QID) | RECTAL | Status: DC | PRN

## 2020-03-24 MED ORDER — POLYETHYLENE GLYCOL 3350 17 G PO PACK: 17.0000 g | PACK | Freq: Every day | ORAL | Status: DC | PRN

## 2020-03-24 MED ORDER — FENTANYL 25 MCG/HR TD PT72
1.0000 | MEDICATED_PATCH | TRANSDERMAL | Status: DC
  Administered 2020-03-24 – 2020-03-27 (×2): 1 via TRANSDERMAL
  Filled 2020-03-24 (×2): qty 1

## 2020-03-24 NOTE — ED Notes (Signed)
Rn assumed care for pt at this time.

## 2020-03-24 NOTE — ED Provider Notes (Signed)
[facility] DEPT Provider Note   CSN: [id number] Arrival date & time: 03/24/20  1630     History Chief Complaint  Patient presents with  . Abnormal Lab    Tony [name] is a 86 y.o. male.  HPI 86 year old male presents with generalized weakness and some confusion.  History is taken from family at bedside.  Patient has been generally weak and harder to get out of bed for a couple weeks but worse over the last 1 week.  Seems to be confused at times, asking questions over and over again and sometimes being disoriented.  No complaints of a headache or vomiting or any other type of pain besides chronic arthritis pain.  Went to oncology office because they thought it might be his anemia but his creatinine was getting worse.  He also saw nephrology today and was told to come to the ER.  Patient is on Lasix 40 mg a day for CHF.  His swelling has been controlled and his weight seems to be good at 170 pounds. No dyspnea.   Past Medical History:  Diagnosis Date  . BPH associated with nocturia    flomax 0.4--> 0.8 mg trial. nocturia if has coffee. some incontinence  . CAD (coronary artery disease)    LAD Stent 2012. 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   . Gout    no rx. apparently 1x in past  . History of stroke    no aspirin before stroke. no deficits. slurred words at time of stroke  . Hyperlipidemia   . Hypertension     Patient Active Problem List   Diagnosis Date Noted  . AKI (acute kidney injury) ([place]) 03/24/2020  . Vascular dementia ([place]) 03/18/2020  . DNR (do not resuscitate)   . HFrEF (heart failure with reduced ejection fraction) ([place]) 01/26/2020  . Osteoarthritis of knee 07/16/2019  . Prostate cancer metastatic to bone ([place]) 08/07/2017  . Macrocytic anemia 07/20/2017  . Thrombocytopenia ([place]) 07/20/2017  . History of complete AV block 10/30/2016  . Chronic kidney disease, stage 3b ([place]) 10/30/2016  . Essential  hypertension   . Hyperlipidemia   . BPH associated with nocturia   . Coronary artery disease involving native coronary artery of native heart without angina pectoris   . History of stroke   . Gout     Past Surgical History:  Procedure Laterality Date  . CARDIAC SURGERY     Stint  . CORONARY STENT PLACEMENT    . left arm fracture s/p surgery    . right leg fracture s.p surgery- screws like arm         Family History  Problem Relation Age of Onset  . Breast cancer Mother   . Heart disease Father   . Heart disease Brother        x2  . Prostate cancer Brother     Social History   Tobacco Use  . Smoking status: Former Smoker    Packs/day: 0.50    Years: 1.00    Pack years: 0.50    Types: Cigarettes    Quit date: 05/01/1952    Years since quitting: 67.9  . Smokeless tobacco: Never Used  Vaping Use  . Vaping Use: Never used  Substance Use Topics  . Alcohol use: No  . Drug use: No    Home Medications Prior to Admission medications   Medication Sig Start Date End Date Taking? Authorizing Provider  aspirin EC 81 MG  tablet Take 1 tablet (81 mg total) by mouth daily. 01/29/17  Yes [name], MD  BIDIL 20-37.5 MG tablet TAKE 1 TABLET BY MOUTH TWICE DAILY Patient taking differently: Take 1 tablet by mouth in the morning and at bedtime.  05/16/19  Yes [name], MD  calcium-vitamin D (OSCAL 500/200 D-3) 500-200 MG-UNIT tablet Take 1 tablet by mouth daily with breakfast. 08/09/17  Yes Brunetta Genera, MD  carvedilol (COREG) 12.5 MG tablet Take 1 tablet (12.5 mg total) by mouth 2 (two) times daily with a meal. 01/31/20  Yes Gonfa, Taye T, MD  docusate sodium (COLACE) 100 MG capsule Take 100 mg by mouth daily.   Yes [provider]  fentaNYL (DURAGESIC) 25 MCG/HR Place 1 patch onto the skin every 3 (three) days. 03/19/20  Yes Tanner, Lyndon Code., PA-C  furosemide (LASIX) 40 MG tablet Take 1 tablet (40 mg total) by mouth daily. Make take one-half additional  tablet 20 mg by mouth as needed for swelling or weight gain of 3 pounds or more in a day or 5 pounds or more in a week. Patient taking differently: Take 40 mg by mouth daily.  02/20/20  Yes Kathyrn Drown D, NP  Multiple Vitamins-Minerals (MULTI COMPLETE PO) Take 1 tablet by mouth daily.    Yes [provider]  nitroGLYCERIN (NITROSTAT) 0.4 MG SL tablet Place 1 tablet (0.4 mg total) under the tongue every 5 (five) minutes as needed for chest pain (3 maximum before seeking care). Patient taking differently: Place 0.4 mg under the tongue every 5 (five) minutes as needed for chest pain.  11/17/16  Yes Marin Olp, MD  Omega-3 1000 MG CAPS Take 1 capsule by mouth daily.    Yes [provider]  polyethylene glycol (MIRALAX) packet Take 17 g by mouth daily. Patient taking differently: Take 17 g by mouth daily as needed for moderate constipation.  09/06/17  Yes Brunetta Genera, MD  predniSONE (DELTASONE) 5 MG tablet Take 1 tablet (5 mg total) by mouth daily with breakfast. 01/31/20 10/27/20 Yes Mercy Riding, MD  tamsulosin (FLOMAX) 0.4 MG CAPS capsule TAKE 1 CAPSULE BY MOUTH TWICE DAILY Patient taking differently: Take 0.4 mg by mouth in the morning and at bedtime.  06/17/19  Yes [name], MD  vitamin B-12 (CYANOCOBALAMIN) 1000 MCG tablet Take 1 tablet (1,000 mcg total) by mouth daily. 07/22/17  Yes Mikhail, [place], DO  Vitamin D, Ergocalciferol, (DRISDOL) 1.25 MG (50000 UNIT) CAPS capsule Take 50,000 Units by mouth See admin instructions. Takes 3 times a week ,Monday, Wednesday and Friday   Yes [provider]  Nutritional Supplements (NEPRO) LIQD Take 237 mLs by mouth in the morning, at noon, and at bedtime. Patient not taking: Reported on 03/24/2020 03/18/20 04/17/20  [name], MD  senna-docusate (SENNA S) 8.6-50 MG tablet Take 2 tablets by mouth 2 (two) times daily. Patient not taking: Reported on 03/24/2020 04/17/19   Brunetta Genera, MD     Allergies    Gabapentin  Review of Systems   Review of Systems  Constitutional: Positive for fatigue. Negative for fever.  Respiratory: Negative for shortness of breath.   Cardiovascular: Negative for chest pain and leg swelling.  Gastrointestinal: Negative for abdominal pain, diarrhea and vomiting.  Neurological: Positive for weakness. Negative for headaches.  Psychiatric/Behavioral: Positive for confusion.  All other systems reviewed and are negative.   Physical Exam Updated Vital Signs BP 124/78 (BP Location: Left Arm)   Pulse  84   Temp 98 F (36.7 C)   Resp 15   SpO2 100%   Physical Exam Vitals and nursing note reviewed.  Constitutional:      General: He is not in acute distress.    Appearance: He is well-developed. He is not ill-appearing or diaphoretic.  HENT:     Head: Normocephalic and atraumatic.     Right Ear: External ear normal.     Left Ear: External ear normal.     Nose: Nose normal.  Eyes:     General:        Right eye: No discharge.        Left eye: No discharge.     Extraocular Movements: Extraocular movements intact.     Pupils: Pupils are equal, round, and reactive to light.  Cardiovascular:     Rate and Rhythm: Normal rate and regular rhythm.     Heart sounds: Normal heart sounds.  Pulmonary:     Effort: Pulmonary effort is normal.     Breath sounds: Normal breath sounds.  Abdominal:     Palpations: Abdomen is soft.     Tenderness: There is no abdominal tenderness.  Musculoskeletal:     Cervical back: Neck supple.  Skin:    General: Skin is warm and dry.  Neurological:     Mental Status: He is alert and oriented to person, place, and time.     Comments: CN 3-12 grossly intact. 5/5 strength in all 4 extremities. Grossly normal sensation. Normal finger to nose.   Psychiatric:        Mood and Affect: Mood is not anxious.     ED Results / Procedures / Treatments   Labs (all labs ordered are listed, but only abnormal results are  displayed) Labs Reviewed  BRAIN NATRIURETIC PEPTIDE - Abnormal; Notable for the following components:      Result Value   B Natriuretic Peptide 722.9 (*)    All other components within normal limits  URINALYSIS, ROUTINE W REFLEX MICROSCOPIC - Abnormal; Notable for the following components:   Hgb urine dipstick SMALL (*)    All other components within normal limits  MAGNESIUM - Abnormal; Notable for the following components:   Magnesium 2.5 (*)    All other components within normal limits  PHOSPHORUS - Abnormal; Notable for the following components:   Phosphorus 1.4 (*)    All other components within normal limits  RESP PANEL BY RT-PCR (FLU A&B, COVID) ARPGX2  AMMONIA  CK  SODIUM, URINE, RANDOM  CREATININE, URINE, RANDOM  MAGNESIUM  PHOSPHORUS  CBC WITH DIFFERENTIAL/PLATELET  TSH  COMPREHENSIVE METABOLIC PANEL    EKG EKG Interpretation  Date/Time:  Wednesday March 24 2020 17:37:56 EST Ventricular Rate:  83 PR Interval:    QRS Duration: 95 QT Interval:  364 QTC Calculation: 428 R Axis:   10 Text Interpretation: Sinus rhythm Multiple premature complexes, vent & supraven Low voltage, precordial leads Nonspecific T abnormalities, diffuse leads no significant change since Sept 2021 Confirmed by Sherwood Gambler [phone]) on 03/24/2020 5:57:01 PM   Radiology DG Chest 2 View  Result Date: 03/24/2020 CLINICAL DATA:  Increased fatigue and confusion with history of metastatic prostate cancer. EXAM: CHEST - 2 VIEW COMPARISON:  January 25, 2020 FINDINGS: The cardiac silhouette is moderately enlarged and unchanged in size. Both lungs are clear. A radiopaque surgical rod is seen throughout the left humerus. Sclerotic areas are again seen involving the osseous skeleton. IMPRESSION: 1. Stable cardiomegaly without active cardiopulmonary disease. 2.  Findings consistent with known history of osseous metastasis. Electronically Signed   By: Virgina Norfolk M.D.   On: 03/24/2020 18:11   CT  Head Wo Contrast  Result Date: 03/24/2020 CLINICAL DATA:  Mental status change of unknown cause. Increased fatigue and confusion EXAM: CT HEAD WITHOUT CONTRAST TECHNIQUE: Contiguous axial images were obtained from the base of the skull through the vertex without intravenous contrast. COMPARISON:  MRI of the brain from March 08, 2020 FINDINGS: Brain: No evidence of acute infarction, hemorrhage, hydrocephalus, extra-axial collection or mass lesion/mass effect. Signs of chronic microvascular ischemic change and atrophy as seen on recent MRI evaluation. Vascular: No hyperdense vessel or unexpected calcification. Skull: Normal. Negative for fracture or focal lesion. Sinuses/Orbits: Visualized paranasal sinuses and orbits are unremarkable. Other: None IMPRESSION: 1. No acute intracranial pathology. 2. Signs of chronic microvascular ischemic change and atrophy as seen on recent MRI evaluation. Electronically Signed   By: Zetta Bills M.D.   On: 03/24/2020 19:20    Procedures Procedures (including critical care time)  Medications Ordered in ED Medications  aspirin EC tablet 81 mg (has no administration in time range)  fentaNYL (DURAGESIC) 25 MCG/HR 1 patch (1 patch Transdermal Patch Applied 03/24/20 2309)  predniSONE (DELTASONE) tablet 5 mg (has no administration in time range)  tamsulosin (FLOMAX) capsule 0.4 mg (0.4 mg Oral Given 03/24/20 2309)  0.9 %  sodium chloride infusion (75 mL/hr Intravenous New Bag/Given 03/24/20 2315)  acetaminophen (TYLENOL) tablet 650 mg (has no administration in time range)    Or  acetaminophen (TYLENOL) suppository 650 mg (has no administration in time range)  HYDROcodone-acetaminophen (NORCO/VICODIN) 5-325 MG per tablet 1-2 tablet (has no administration in time range)  senna (SENOKOT) tablet 8.6 mg (8.6 mg Oral Given 03/24/20 2309)  polyethylene glycol (MIRALAX / GLYCOLAX) packet 17 g (has no administration in time range)  heparin injection 5,000 Units (has no  administration in time range)  sodium chloride 0.9 % bolus 500 mL (500 mLs Intravenous New Bag/Given 03/24/20 1743)    ED Course  I have reviewed the triage vital signs and the nursing notes.  Pertinent labs & imaging results that were available during my care of the patient were reviewed by me and considered in my medical decision making (see chart for details).    MDM Rules/Calculators/A&P                          Patient's nephrologist and oncologist sent him over to be admitted.  His creatinine is a little worse than it was before and seems to be trending worse.  While he does have CHF, he does not appear to have fluid overload at this time and I think he is a little dry.  Was given a 500 cc IV bolus.  Probably has been over diuresed.  Will admit to hospitalist service. Final Clinical Impression(s) / ED Diagnoses Final diagnoses:  Acute renal failure superimposed on chronic kidney disease, unspecified CKD stage, unspecified acute renal failure type [facility])    Rx / DC Orders ED Discharge Orders    None       Sherwood Gambler, MD 03/24/20 2346

## 2020-03-24 NOTE — ED Notes (Signed)
Collected pt's urine at this time.  Pt reports no pain or complaints.  NADN.  Will continue to monitor.

## 2020-03-24 NOTE — ED Notes (Signed)
Report called to Sharyn Lull, Therapist, sports.  Pt SBAR information covered in detail.  No additional questions asked at this time.  Will continue to monitor.

## 2020-03-24 NOTE — H&P (Signed)
Tony [name] RCV:[id number] DOB: [birth year] DOA: 03/24/2020     PCP: [name], MD   Outpatient Specialists:   CARDS:   [facility]  NEphrology:  Dr. Carmina Miller    Oncology  Dr. [name]    Patient arrived to ER on 03/24/20 at 1630 Referred by Attending Sherwood Gambler, MD   Patient coming from: home Lives With family    Chief Complaint:   Chief Complaint  Patient presents with  . Abnormal Lab    HPI: Tony [name] is a 86 y.o. male with medical history significant of vascular dementia, systolic CHF chronic, CKD stage IIIb, metastatic prostate cancer with mets to the bones,  severe osteoarthritis on narcotics, CAD    Presented with   increased confusion and fatigue at home for the past 1 week he went into get his blood work checked because before he have had an episode of anemia His blood counts were good but his creatinine noted to go up to 4.0 from baseline around 3 He has been having trouble getting out of bed and had generalized fatigue he has been more confused repeating questions over and over again. He has chronic pain and has recently been started on fentanyl patch. He was seen by nephrology today and was told to come to emergency department He was still taking Lasix 40 mg a day for his heart failure  He have not had increased swelling or dyspnea Family having trouble with feeding him  3 weeks ago was admitted for AKI in the setting of CKD was rehydrated Noted to be anemic with hemoglobin down to 7.5 he was transfused 1 unit no evidence of active bleeding  Regarding patient's history of metastatic prostate cancer he is currently on steroids and his [name] has been held because he felt it was causing congestive heart failure.   Infectious risk factors:  Reports none   Has  been vaccinated against COVID due to get the booster   Initial COVID TEST  NEGATIVE   Lab Results  Component Value Date   SARSCOV2NAA NEGATIVE 03/24/2020   [place]  NEGATIVE 03/04/2020   [place] NEGATIVE 01/25/2020     Regarding pertinent Chronic problems:     Hyperlipidemia -not on on statins Lipid Panel     Component Value Date/Time   CHOL 210 (H) 01/29/2020 0545   TRIG 149 01/29/2020 0545   HDL 51 01/29/2020 0545   CHOLHDL 4.1 01/29/2020 0545   VLDL 30 01/29/2020 0545   LDLCALC 129 (H) 01/29/2020 0545   LDLDIRECT 141.0 10/30/2016 1528   Prostate Cancer   Patient was on Zytiga for his prostate cancer which was thought to be the reason for his CHF.  The medication was discontinued.   HTN on on Lasix beta-blocker BiDil.    chronic CHF  Systolic/ -  EF is known to be about 35 to 40%    CAD  - On Aspirin, statin, betablocker                 -  followed by cardiology         Hx of CVA -  with/out residual deficits on Aspirin 81 mg,    CKD stage IIIb- baseline Cr 30 Estimated Creatinine Clearance: 13.4 mL/min (A) (by C-G formula based on SCr of 4 mg/dL [facility])).  Lab Results  Component Value Date   CREATININE 4.00 (HH) 03/24/2020   CREATININE 3.29 (H) 03/18/2020   CREATININE 2.83 (H) 03/12/2020  BPH - on Flomax,       Chronic anemia - baseline hg Hemoglobin & Hematocrit  Recent Labs    03/12/20 0412 03/18/20 1135 03/24/20 1354  HGB 9.2* 9.3* 8.9*    While in ER: Ammonia 24 CT head wnl CXR cardiomegaly  Hospitalist was called for admission for AKI in the setting of CKD  The following Work up has been ordered so far:  Orders Placed This Encounter  Procedures  . Resp Panel by RT-PCR (Flu A&B, Covid) Nasopharyngeal Swab  . DG Chest 2 View  . CT Head Wo Contrast  . Brain natriuretic peptide  . Ammonia  . Urinalysis, Routine w reflex microscopic  . Consult to hospitalist  ALL PATIENTS BEING ADMITTED/HAVING PROCEDURES NEED COVID-19 SCREENING  . ED EKG  . EKG 12-Lead  . Saline lock IV    Following Medications were ordered in ER: Medications  sodium chloride 0.9 % bolus 500 mL (500 mLs Intravenous New Bag/Given  03/24/20 1743)        Consult Orders  (From admission, onward)         Start     Ordered   03/24/20 1957  Consult to hospitalist  ALL PATIENTS BEING ADMITTED/HAVING PROCEDURES NEED COVID-19 SCREENING  Once       Comments: ALL PATIENTS BEING ADMITTED/HAVING PROCEDURES NEED COVID-19 SCREENING  Provider:  (Not yet assigned)  Question Answer Comment  Place call to: [facility]   Reason for Consult Admit      03/24/20 1957          Significant initial  Findings: Abnormal Labs Reviewed  BRAIN NATRIURETIC PEPTIDE - Abnormal; Notable for the following components:      Result Value   B Natriuretic Peptide 722.9 (*)    All other components within normal limits  URINALYSIS, ROUTINE W REFLEX MICROSCOPIC - Abnormal; Notable for the following components:   Hgb urine dipstick SMALL (*)    All other components within normal limits    Otherwise labs showing:    Recent Labs  Lab 03/18/20 1135 03/24/20 1354  NA 139 140  K 4.3 4.2  CO2 22 26  GLUCOSE 216* 123*  BUN 50* 66*  CREATININE 3.29* 4.00*  CALCIUM 8.7 8.8*    Cr   Up from baseline see below Lab Results  Component Value Date   CREATININE 4.00 (HH) 03/24/2020   CREATININE 3.29 (H) 03/18/2020   CREATININE 2.83 (H) 03/12/2020    Recent Labs  Lab 03/18/20 1135 03/24/20 1354  AST 22 15  ALT 10 9  ALKPHOS  --  224*  BILITOT 0.5 0.5  PROT 5.5* 6.7  ALBUMIN  --  2.6*   Lab Results  Component Value Date   CALCIUM 8.8 (L) 03/24/2020   PHOS 2.0 (L) 01/31/2020     WBC      Component Value Date/Time   WBC 7.6 03/24/2020 1354   WBC 8.4 03/18/2020 1135   LYMPHSABS 1.2 03/24/2020 1354   MONOABS 0.6 03/24/2020 1354   EOSABS 0.0 03/24/2020 1354   BASOSABS 0.0 03/24/2020 1354   Plt: Lab Results  Component Value Date   PLT 210 03/24/2020       HG/HCT  stable,      Component Value Date/Time   HGB 8.9 (L) 03/24/2020 1354   HCT 28.9 (L) 03/24/2020 1354   HCT 21.9 (L) 07/20/2017 2103   MCV 96.0  03/24/2020 1354    No results for input(s): LIPASE, AMYLASE in the last 168 hours. Recent  Labs  Lab 03/24/20 1731  AMMONIA 24    Cardiac Panel (last 3 results) No results for input(s): CKTOTAL, CKMB, TROPONINI, RELINDX in the last 72 hours.     ECG: Ordered Personally reviewed by me showing: HR : 83 Rhythm:  NSR   no evidence of ischemic changes QTC 428   BNP (last 3 results) Recent Labs    01/25/20 1633 03/24/20 1731  BNP 662.0* 722.9*       UA   no evidence of UTI      Urine analysis:    Component Value Date/Time   COLORURINE YELLOW 03/24/2020 1659   APPEARANCEUR CLEAR 03/24/2020 1659   LABSPEC 1.011 03/24/2020 1659   PHURINE 5.0 03/24/2020 1659   GLUCOSEU NEGATIVE 03/24/2020 1659   HGBUR SMALL (A) 03/24/2020 1659   BILIRUBINUR NEGATIVE 03/24/2020 1659   KETONESUR NEGATIVE 03/24/2020 1659   PROTEINUR NEGATIVE 03/24/2020 1659   UROBILINOGEN 1.0 05/06/2012 2022   NITRITE NEGATIVE 03/24/2020 1659   LEUKOCYTESUR NEGATIVE 03/24/2020 1659      Ordered  CT HEAD   NON acute  CXR - NON acute     ED Triage Vitals  Enc Vitals Group     BP 03/24/20 1640 124/71     Pulse Rate 03/24/20 1640 86     Resp 03/24/20 1640 17     Temp 03/24/20 1640 98.3 F (36.8 C)     Temp Source 03/24/20 1640 Oral     SpO2 03/24/20 1640 98 %     Weight --      Height --      Head Circumference --      Peak Flow --      Pain Score 03/24/20 1643 0     Pain Loc --      Pain Edu? --      Excl. in [place]? --   TMAX(24)@       Latest  Blood pressure 123/72, pulse 79, temperature 98.3 F (36.8 C), temperature source Oral, resp. rate 18, SpO2 99 %.    Review of Systems:    Pertinent positives include:    fatigue,   Constitutional:  No weight loss, night sweats, Fevers, chills, weight loss  HEENT:  No headaches, Difficulty swallowing,Tooth/dental problems,Sore throat,  No sneezing, itching, ear ache, nasal congestion, post nasal drip,  Cardio-vascular:  No chest pain,  Orthopnea, PND, anasarca, dizziness, palpitations.no Bilateral lower extremity swelling  GI:  No heartburn, indigestion, abdominal pain, nausea, vomiting, diarrhea, change in bowel habits, loss of appetite, melena, blood in stool, hematemesis Resp:  no shortness of breath at rest. No dyspnea on exertion, No excess mucus, no productive cough, No non-productive cough, No coughing up of blood.No change in color of mucus.No wheezing. Skin:  no rash or lesions. No jaundice GU:  no dysuria, change in color of urine, no urgency or frequency. No straining to urinate.  No flank pain.  Musculoskeletal:  No joint pain or no joint swelling. No decreased range of motion. No back pain.  Psych:  No change in mood or affect. No depression or anxiety. No memory loss.  Neuro: no localizing neurological complaints, no tingling, no weakness, no double vision, no gait abnormality, no slurred speech, no confusion  All systems reviewed and apart from [place] all are negative  Past Medical History:   Past Medical History:  Diagnosis Date  . BPH associated with nocturia    flomax 0.4--> 0.8 mg trial. nocturia if has coffee. some incontinence  . CAD (coronary  artery disease)    LAD Stent 2012. 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   . Gout    no rx. apparently 1x in past  . History of stroke    no aspirin before stroke. no deficits. slurred words at time of stroke  . Hyperlipidemia   . Hypertension       Past Surgical History:  Procedure Laterality Date  . CARDIAC SURGERY     Stint  . CORONARY STENT PLACEMENT    . left arm fracture s/p surgery    . right leg fracture s.p surgery- screws like arm      Social History:  Ambulatory  walker       reports that he quit smoking about 67 years ago. His smoking use included cigarettes. He has a 0.50 pack-year smoking history. He has never used smokeless tobacco. He reports that he does not drink alcohol and does not use drugs.     Family History:   Family History  Problem Relation Age of Onset  . Breast cancer Mother   . Heart disease Father   . Heart disease Brother        x2  . Prostate cancer Brother     Allergies: Allergies  Allergen Reactions  . Gabapentin Other (See Comments)    Patient states caused hallucinations      Prior to Admission medications   Medication Sig Start Date End Date Taking? Authorizing Provider  aspirin EC 81 MG tablet Take 1 tablet (81 mg total) by mouth daily. 01/29/17   [name], MD  BIDIL 20-37.5 MG tablet TAKE 1 TABLET BY MOUTH TWICE DAILY 05/16/19   [name], MD  calcium-vitamin D (OSCAL 500/200 D-3) 500-200 MG-UNIT tablet Take 1 tablet by mouth daily with breakfast. 08/09/17   Brunetta Genera, MD  carvedilol (COREG) 12.5 MG tablet Take 1 tablet (12.5 mg total) by mouth 2 (two) times daily with a meal. 01/31/20   Mercy Riding, MD  fentaNYL (DURAGESIC) 25 MCG/HR Place 1 patch onto the skin every 3 (three) days. 03/19/20   Tanner, Lyndon Code., PA-C  furosemide (LASIX) 40 MG tablet Take 1 tablet (40 mg total) by mouth daily. Make take one-half additional tablet 20 mg by mouth as needed for swelling or weight gain of 3 pounds or more in a day or 5 pounds or more in a week. 02/20/20   Tommie Raymond, NP  Multiple Vitamins-Minerals (MULTI COMPLETE PO) Take 1 tablet by mouth daily.     [provider]  nitroGLYCERIN (NITROSTAT) 0.4 MG SL tablet Place 1 tablet (0.4 mg total) under the tongue every 5 (five) minutes as needed for chest pain (3 maximum before seeking care). 11/17/16   Marin Olp, MD  Nutritional Supplements (NEPRO) LIQD Take 237 mLs by mouth in the morning, at noon, and at bedtime. 03/18/20 04/17/20  [name], MD  Omega-3 1000 MG CAPS Take 1 capsule by mouth daily.     [provider]  polyethylene glycol (MIRALAX) packet Take 17 g by mouth daily. Patient taking differently: Take 17 g by mouth daily as needed for moderate  constipation.  09/06/17   Brunetta Genera, MD  predniSONE (DELTASONE) 5 MG tablet Take 1 tablet (5 mg total) by mouth daily with breakfast. 01/31/20 10/27/20  Mercy Riding, MD  senna-docusate (SENNA S) 8.6-50 MG tablet Take 2 tablets by mouth 2 (two) times daily. Patient taking differently: Take 2 tablets  by mouth daily as needed for moderate constipation.  04/17/19   Brunetta Genera, MD  tamsulosin (FLOMAX) 0.4 MG CAPS capsule TAKE 1 CAPSULE BY MOUTH TWICE DAILY 06/17/19   [name], MD  vitamin B-12 (CYANOCOBALAMIN) 1000 MCG tablet Take 1 tablet (1,000 mcg total) by mouth daily. 07/22/17   Cristal Ford, DO   Physical Exam: Vitals with BMI 03/24/2020 03/24/2020 03/24/2020  Height - - -  Weight - - -  BMI - - -  Systolic 588 502 774  Diastolic 72 82 61  Pulse 79 79 81    1. General:  in No  Acute distress   Chronically ill -appearing 2. Psychological: Alert and  Oriented 3. Head/ENT:    Dry Mucous Membranes                          Head Non traumatic, neck supple                            Poor Dentition 4. SKIN:   decreased Skin turgor,  Skin clean Dry and intact no rash 5. Heart: Regular rate and rhythm no Murmur, no Rub or gallop 6. Lungs: no wheezes or crackles   7. Abdomen: Soft,  non-tender, Non distended bowel sounds present 8. Lower extremities: no clubbing, cyanosis, no edema 9. Neurologically Grossly intact, moving all 4 extremities equally  10. MSK: Normal range of motion   All other LABS:     Recent Labs  Lab 03/18/20 1135 03/24/20 1354  WBC 8.4 7.6  NEUTROABS  --  5.7  HGB 9.3* 8.9*  HCT 28.7* 28.9*  MCV 93.2 96.0  PLT 184 210     Recent Labs  Lab 03/18/20 1135 03/24/20 1354  NA 139 140  K 4.3 4.2  CL 102 102  CO2 22 26  GLUCOSE 216* 123*  BUN 50* 66*  CREATININE 3.29* 4.00*  CALCIUM 8.7 8.8*     Recent Labs  Lab 03/18/20 1135 03/24/20 1354  AST 22 15  ALT 10 9  ALKPHOS  --  224*  BILITOT 0.5 0.5  PROT 5.5* 6.7  ALBUMIN   --  2.6*    Cultures:    Component Value Date/Time   SDES  01/25/2020 1619    URINE, RANDOM Performed at [facility], [place] [address], [place], [place] [address]    SPECREQUEST  01/25/2020 1619    NONE Performed at [facility], [place] [address] [place], [place] [address]    CULT >=100,000 COLONIES/mL ESCHERICHIA COLI (A) 01/25/2020 1619   REPTSTATUS 01/28/2020 FINAL 01/25/2020 1619     Radiological Exams on Admission: DG Chest 2 View  Result Date: 03/24/2020 CLINICAL DATA:  Increased fatigue and confusion with history of metastatic prostate cancer. EXAM: CHEST - 2 VIEW COMPARISON:  January 25, 2020 FINDINGS: The cardiac silhouette is moderately enlarged and unchanged in size. Both lungs are clear. A radiopaque surgical rod is seen throughout the left humerus. Sclerotic areas are again seen involving the osseous skeleton. IMPRESSION: 1. Stable cardiomegaly without active cardiopulmonary disease. 2. Findings consistent with known history of osseous metastasis. Electronically Signed   By: Virgina Norfolk M.D.   On: 03/24/2020 18:11   CT Head Wo Contrast  Result Date: 03/24/2020 CLINICAL DATA:  Mental status change of unknown cause. Increased fatigue and confusion EXAM: CT HEAD WITHOUT CONTRAST TECHNIQUE: Contiguous axial images were obtained from  the base of the skull through the vertex without intravenous contrast. COMPARISON:  MRI of the brain from March 08, 2020 FINDINGS: Brain: No evidence of acute infarction, hemorrhage, hydrocephalus, extra-axial collection or mass lesion/mass effect. Signs of chronic microvascular ischemic change and atrophy as seen on recent MRI evaluation. Vascular: No hyperdense vessel or unexpected calcification. Skull: Normal. Negative for fracture or focal lesion. Sinuses/Orbits: Visualized paranasal sinuses and orbits are unremarkable. Other: None IMPRESSION: 1. No acute intracranial pathology. 2. Signs of chronic  microvascular ischemic change and atrophy as seen on recent MRI evaluation. Electronically Signed   By: Zetta Bills M.D.   On: 03/24/2020 19:20    Chart has been reviewed    Assessment/Plan   86 y.o. male with medical history significant of vascular dementia, systolic CHF chronic, CKD stage IIIb, metastatic prostate cancer with mets to the bones,  severe osteoarthritis on narcotics, CAD Admitted for dehydration  Present on Admission: . AKI (acute kidney injury) ([place]) patient has had progressive decline in his kidney function In the past renal ultrasound showed no evidence of obstruction, bladder scan in the ER showed 41 mL only patient continues to urinate.  Obtain urine electrolytes Stop Lasix and gently rehydrate Continue to follow renal function Will likely need follow-up appointment with nephrology versus inpatient consult if does not improve   . Vascular dementia ([place]) -there is some sundowning and delirium on occasion.  Likely has some degenerative worsening renal function and overall debility. Continue to monitor  . Prostate cancer metastatic to bone [facility]) notified oncology patient is being admitted discussed with family at this point they are thinking about possibility of hospice but would like to still discuss with oncology prior to committing. Order palliative care consult Please reconsult oncology in a.m. Family thought about possible placement but would like to know how he does if she still able to continue to ambulate they would like to take him home but if patient is completely dependent placement probably would be preferable  . Macrocytic anemia -chronic stable no indication for blood transfusion at this time  . Hyperlipidemia chronic stable continue Zetia  . HFrEF (heart failure with reduced ejection fraction) (HCC) chronic systolic CHF - - currently appears to be slightly on the dry side, hold home diuretics for tonight and restart when appears euvolemic, carefuly  follow fluid status and Cr  . Essential hypertension soft blood pressures in the emergency department allow permissive hypertension  . Coronary artery disease involving native coronary artery of native heart without angina pectoris Currently stable resume home medications when able to tolerate  . Chronic kidney disease, stage 3b (HCC) progressing given acute on chronic AKI hold Lasix for tonight gentle rehydration see how he responds to IV fluids avoid nephrotoxic medications avoid hypotension  . BPH associated with nocturia stable continue home meds   Other plan as per orders.  DVT prophylaxis:  SCD    Code Status:    Code Status: Prior   DNR/DNI  as per family  I had personally discussed CODE STATUS with family     Family Communication:   Family  at  Bedside  plan of care was discussed on the phone with   Daughter  Disposition Plan:   likely will need placement for rehabilitation                           Following barriers for discharge:  Electrolytes corrected                                Will likely need home health, home O2, set up                           Will need consultants to evaluate patient prior to discharge                       Would benefit from PT/OT eval prior to DC  Ordered                                     Transition of care consulted                   Nutrition    consulted                                     Palliative care    consulted                                   Consults called:  Emailed oncology Dr. [name] and Dr. Lorenso Courier the patient has been admitted please consult in a.m. for goals of care discussion  Admission status:  ED Disposition    ED Disposition Condition [place]: [place] [[id number]]  Level of Care: Telemetry [5]  Admit to tele based on following criteria: Other see comments  Comments: aki  Covid Evaluation: Confirmed COVID Negative  Diagnosis: AKI (acute  kidney injury) [facility]) [[id number]]  Admitting Physician: Toy Baker [[id number]]  Attending Physician: Toy Baker [[id number]]        Obs     Level of care tele  For 12H     Lab Results  Component Value Date   [place] 03/24/2020     Precautions: admitted as Covid Negative     PPE: Used by the provider:   P100  eye Goggles,  Gloves     Janalee Grobe 03/24/2020, 10:42 PM    [facility]     after 2 AM please page floor coverage PA If 7AM-7PM, please contact the day team taking care of the patient using [email or web address]   Patient was evaluated in the context of the global COVID-19 pandemic, which necessitated consideration that the patient might be at risk for infection with the SARS-CoV-2 virus that causes COVID-19. Institutional protocols and algorithms that pertain to the evaluation of patients at risk for COVID-19 are in a state of rapid change based on information released by regulatory bodies including the CDC and federal and state organizations. These policies and algorithms were followed during the patient's care.

## 2020-03-24 NOTE — Telephone Encounter (Signed)
Patient does not need blood transfusion. Pt is at an appt with Nephrologist now. CMET results faxed to [facility]. Creatinine 4.0, daughter aware.   [place] on call physician Dr Lorenso Courier notified

## 2020-03-24 NOTE — ED Triage Notes (Signed)
Pt sent to ED after labs at Oncology office today. Creatinine 4.0 from labs this afternoon. Pt originally went into office this morning to check hemoglobin due to increased fatigue and confusion at home x1 week.

## 2020-03-25 ENCOUNTER — Observation Stay (HOSPITAL_COMMUNITY): Payer: Medicare Other

## 2020-03-25 DIAGNOSIS — N1832 Chronic kidney disease, stage 3b: Secondary | ICD-10-CM | POA: Diagnosis not present

## 2020-03-25 DIAGNOSIS — Z79899 Other long term (current) drug therapy: Secondary | ICD-10-CM | POA: Diagnosis not present

## 2020-03-25 DIAGNOSIS — Z20822 Contact with and (suspected) exposure to covid-19: Secondary | ICD-10-CM | POA: Diagnosis present

## 2020-03-25 DIAGNOSIS — Z7952 Long term (current) use of systemic steroids: Secondary | ICD-10-CM | POA: Diagnosis not present

## 2020-03-25 DIAGNOSIS — Z8673 Personal history of transient ischemic attack (TIA), and cerebral infarction without residual deficits: Secondary | ICD-10-CM | POA: Diagnosis not present

## 2020-03-25 DIAGNOSIS — C61 Malignant neoplasm of prostate: Secondary | ICD-10-CM | POA: Diagnosis present

## 2020-03-25 DIAGNOSIS — G9341 Metabolic encephalopathy: Secondary | ICD-10-CM | POA: Diagnosis present

## 2020-03-25 DIAGNOSIS — C7951 Secondary malignant neoplasm of bone: Secondary | ICD-10-CM | POA: Diagnosis present

## 2020-03-25 DIAGNOSIS — Z515 Encounter for palliative care: Secondary | ICD-10-CM | POA: Diagnosis not present

## 2020-03-25 DIAGNOSIS — Z7982 Long term (current) use of aspirin: Secondary | ICD-10-CM | POA: Diagnosis not present

## 2020-03-25 DIAGNOSIS — Z7189 Other specified counseling: Secondary | ICD-10-CM

## 2020-03-25 DIAGNOSIS — R531 Weakness: Secondary | ICD-10-CM | POA: Diagnosis not present

## 2020-03-25 DIAGNOSIS — D539 Nutritional anemia, unspecified: Secondary | ICD-10-CM | POA: Diagnosis not present

## 2020-03-25 DIAGNOSIS — Z66 Do not resuscitate: Secondary | ICD-10-CM | POA: Diagnosis present

## 2020-03-25 DIAGNOSIS — Z87891 Personal history of nicotine dependence: Secondary | ICD-10-CM | POA: Diagnosis not present

## 2020-03-25 DIAGNOSIS — N179 Acute kidney failure, unspecified: Secondary | ICD-10-CM | POA: Diagnosis present

## 2020-03-25 DIAGNOSIS — Z79891 Long term (current) use of opiate analgesic: Secondary | ICD-10-CM | POA: Diagnosis not present

## 2020-03-25 DIAGNOSIS — I130 Hypertensive heart and chronic kidney disease with heart failure and stage 1 through stage 4 chronic kidney disease, or unspecified chronic kidney disease: Secondary | ICD-10-CM | POA: Diagnosis present

## 2020-03-25 DIAGNOSIS — E785 Hyperlipidemia, unspecified: Secondary | ICD-10-CM | POA: Diagnosis not present

## 2020-03-25 DIAGNOSIS — N189 Chronic kidney disease, unspecified: Secondary | ICD-10-CM | POA: Diagnosis present

## 2020-03-25 DIAGNOSIS — Z8042 Family history of malignant neoplasm of prostate: Secondary | ICD-10-CM | POA: Diagnosis not present

## 2020-03-25 DIAGNOSIS — N401 Enlarged prostate with lower urinary tract symptoms: Secondary | ICD-10-CM | POA: Diagnosis present

## 2020-03-25 DIAGNOSIS — I5020 Unspecified systolic (congestive) heart failure: Secondary | ICD-10-CM | POA: Diagnosis not present

## 2020-03-25 DIAGNOSIS — I10 Essential (primary) hypertension: Secondary | ICD-10-CM | POA: Diagnosis not present

## 2020-03-25 DIAGNOSIS — E860 Dehydration: Secondary | ICD-10-CM | POA: Diagnosis present

## 2020-03-25 DIAGNOSIS — Z955 Presence of coronary angioplasty implant and graft: Secondary | ICD-10-CM | POA: Diagnosis not present

## 2020-03-25 DIAGNOSIS — F0150 Vascular dementia without behavioral disturbance: Secondary | ICD-10-CM | POA: Diagnosis present

## 2020-03-25 DIAGNOSIS — I5022 Chronic systolic (congestive) heart failure: Secondary | ICD-10-CM | POA: Diagnosis present

## 2020-03-25 DIAGNOSIS — I2510 Atherosclerotic heart disease of native coronary artery without angina pectoris: Secondary | ICD-10-CM | POA: Diagnosis not present

## 2020-03-25 DIAGNOSIS — F05 Delirium due to known physiological condition: Secondary | ICD-10-CM | POA: Diagnosis present

## 2020-03-25 LAB — COMPREHENSIVE METABOLIC PANEL
ALT: 13 U/L (ref 0–44)
AST: 16 U/L (ref 15–41)
Albumin: 2.5 g/dL — ABNORMAL LOW (ref 3.5–5.0)
Alkaline Phosphatase: 166 U/L — ABNORMAL HIGH (ref 38–126)
Anion gap: 8 (ref 5–15)
BUN: 67 mg/dL — ABNORMAL HIGH (ref 8–23)
CO2: 26 mmol/L (ref 22–32)
Calcium: 7.9 mg/dL — ABNORMAL LOW (ref 8.9–10.3)
Chloride: 104 mmol/L (ref 98–111)
Creatinine, Ser: 3.71 mg/dL — ABNORMAL HIGH (ref 0.61–1.24)
GFR, Estimated: 15 mL/min — ABNORMAL LOW (ref >60)
Glucose, Bld: 103 mg/dL — ABNORMAL HIGH (ref 70–99)
Potassium: 4 mmol/L (ref 3.5–5.1)
Sodium: 138 mmol/L (ref 135–145)
Total Bilirubin: 0.6 mg/dL (ref 0.3–1.2)
Total Protein: 5.6 g/dL — ABNORMAL LOW (ref 6.5–8.1)

## 2020-03-25 LAB — CBC WITH DIFFERENTIAL/PLATELET
Abs Immature Granulocytes: 0.09 10*3/uL — ABNORMAL HIGH (ref 0.00–0.07)
Basophils Absolute: 0 10*3/uL (ref 0.0–0.1)
Basophils Relative: 0 %
Eosinophils Absolute: 0 10*3/uL (ref 0.0–0.5)
Eosinophils Relative: 0 %
HCT: 25 % — ABNORMAL LOW (ref 39.0–52.0)
Hemoglobin: 7.7 g/dL — ABNORMAL LOW (ref 13.0–17.0)
Immature Granulocytes: 1 %
Lymphocytes Relative: 20 %
Lymphs Abs: 1.3 10*3/uL (ref 0.7–4.0)
MCH: 30.1 pg (ref 26.0–34.0)
MCHC: 30.8 g/dL (ref 30.0–36.0)
MCV: 97.7 fL (ref 80.0–100.0)
Monocytes Absolute: 0.6 10*3/uL (ref 0.1–1.0)
Monocytes Relative: 9 %
Neutro Abs: 4.4 10*3/uL (ref 1.7–7.7)
Neutrophils Relative %: 70 %
Platelets: 188 10*3/uL (ref 150–400)
RBC: 2.56 MIL/uL — ABNORMAL LOW (ref 4.22–5.81)
RDW: 18.3 % — ABNORMAL HIGH (ref 11.5–15.5)
WBC: 6.4 10*3/uL (ref 4.0–10.5)
nRBC: 0 % (ref 0.0–0.2)

## 2020-03-25 LAB — TSH: TSH: 1.165 u[IU]/mL (ref 0.350–4.500)

## 2020-03-25 LAB — PHOSPHORUS: Phosphorus: 1.6 mg/dL — ABNORMAL LOW (ref 2.5–4.6)

## 2020-03-25 LAB — MAGNESIUM: Magnesium: 2.1 mg/dL (ref 1.7–2.4)

## 2020-03-25 MED ORDER — POTASSIUM PHOSPHATES 15 MMOLE/5ML IV SOLN
30.0000 mmol | Freq: Once | INTRAVENOUS | Status: AC
  Administered 2020-03-25: 30 mmol via INTRAVENOUS
  Filled 2020-03-25: qty 10

## 2020-03-25 MED ORDER — SODIUM CHLORIDE 0.9 % IV SOLN
75.0000 mL/h | INTRAVENOUS | Status: DC
  Administered 2020-03-25: 75 mL/h via INTRAVENOUS

## 2020-03-25 NOTE — Progress Notes (Signed)
PROGRESS NOTE    Tony [name]  VQQ:[id number] DOB: [birth year] DOA: 03/24/2020 PCP: [name], MD    Chief Complaint  Patient presents with  . Abnormal Lab    Brief Narrative:  HPI per Dr.Doutova Tony [name] is a 86 y.o. male with medical history significant of vascular dementia, systolic CHF chronic, CKD stage IIIb, metastatic prostate cancer with mets to the bones, severe osteoarthritis on narcotics, CAD    Presented with   increased confusion and fatigue at home for the past 1 week he went into get his blood work checked because before he have had an episode of anemia His blood counts were good but his creatinine noted to go up to 4.0 from baseline around 3 He has been having trouble getting out of bed and had generalized fatigue he has been more confused repeating questions over and over again. He has chronic pain and has recently been started on fentanyl patch. He was seen by nephrology today and was told to come to emergency department He was still taking Lasix 40 mg a day for his heart failure  He have not had increased swelling or dyspnea Family having trouble with feeding him  3 weeks ago was admitted for AKI in the setting of CKD was rehydrated Noted to be anemic with hemoglobin down to 7.5 he was transfused 1 unit no evidence of active bleeding  Regarding patient's history of metastatic prostate cancer he is currently on steroids and his [name] has been held because he felt it was causing congestive heart failure.   Infectious risk factors:  Reports none   Has  been vaccinated against COVID due to get the booster   Initial COVID TEST  NEGATIVE   Assessment & Plan:   Principal Problem:   Acute renal failure superimposed on chronic kidney disease ([place]) Active Problems:   Essential hypertension   Hyperlipidemia   BPH associated with nocturia   Coronary artery disease involving native coronary artery of native heart without angina pectoris    History of stroke   Chronic kidney disease, stage 3b (HCC)   Macrocytic anemia   Prostate cancer metastatic to bone (HCC)   HFrEF (heart failure with reduced ejection fraction) (HCC)   Vascular dementia (HCC)   AKI (acute kidney injury) ([place])   Hypophosphatemia   1 acute renal failure on chronic kidney disease stage IIIb Baseline creatinine approximately 2.8.  Patient noted to have had a progressive decline in his kidney function. Acute renal failure likely secondary to a prerenal azotemia in the setting of diuretics.  Urine sodium of 39.  Urine creatinine of 118.14.  Urinalysis nitrite negative leukocytes negative, negative for protein.  Renal ultrasound negative for hydronephrosis.  Patient on gentle hydration with improvement with renal function.  Creatinine currently at 3.71 from 4.0 on admission.  Continue gentle hydration.  Supportive care.  2.  Hypophosphatemia K-Phos 30 mmol IV x1.  Repeat labs in the morning.  3.  Acute metabolic encephalopathy Questionable etiology.  Likely secondary to problem #1 and dehydration.  Improved with hydration.  Alert and oriented to self place and time.  Likely at baseline.  4.  Vascular dementia Patient with some sundowning and delirium on occasion.  Stable.  Follow.  5.  Prostate cancer with metastases to the bone Oncology notified of patient's admission via epic.  Palliative care consulted for goals of care.  Per oncology.  6.  Hyperlipidemia Continue Zetia.  7.  Normocytic anemia anemia/anemia of chronic disease Patient  with no overt bleeding.  Hemoglobin currently at 7.7 likely dilutional effect in the setting of metastatic prostate cancer..  Anemia panel from January 27, 2020 with iron level of 43, TIBC of 159, ferritin of May 02, 2006, folate of 26.7.  Transfusion threshold hemoglobin < 7-7.5.  Discontinue heparin.  Follow.  8.  Chronic systolic heart failure Patient noted to be more on the dry side on admission and as such  diuretics were held and patient placed on gentle hydration.  Monitor closely for volume overload.  9.  Hypertension Patient noted to have borderline soft blood pressures on presentation.  Permissive hypertension.  Follow.  10.  Coronary artery disease Stable.  Continue aspirin.  Diuretics on hold due to borderline blood pressure.  Follow.  11.  BPH Flomax.    DVT prophylaxis: SCDs>>> anemia Code Status: DNR Family Communication: Updated patient.  No family at bedside. Disposition:   Status is: Inpatient    Dispo: The patient is from: Home              Anticipated d/c is to: SNF with palliative care following versus home with home health, to be determined              Anticipated d/c date is: 2 to 3 days.              Patient currently in acute on chronic kidney disease on gentle hydration, history of prostate cancer with mets to the bone, not stable for discharge.       Consultants:   Palliative care: Dr. Rowe Pavy March 25, 2020  Oncology Dr. [name] notified via epic of admission.  Procedures:   Renal ultrasound March 25, 2020  Chest x-ray March 24, 2020  CT head March 24, 2020    Antimicrobials:   None   Subjective: Patient sitting in chair.  States he is feeling better than on admission.  Alert and oriented to self place and time.  No signs of confusion.  Feels some improvement with fatigue since admission.  Denies any chest pain.  No shortness of breath.  Patient denies any overt bleeding.  Objective: Vitals:   03/24/20 2205 03/25/20 0227 03/25/20 0616 03/25/20 1405  BP: 124/78 113/66 128/72 113/61  Pulse: 84 79 80 87  Resp: 15 15  18   Temp: 98 F (36.7 C) 98.4 F (36.9 C) 98.1 F (36.7 C) 98.1 F (36.7 C)  TempSrc:   Oral Oral  SpO2: 100% 99% 99% 100%    Intake/Output Summary (Last 24 hours) at 03/25/2020 1556 Last data filed at 03/25/2020 1300 Gross per 24 hour  Intake 801.94 ml  Output 300 ml  Net 501.94 ml   There were no  vitals filed for this visit.  Examination:  General exam: Appears calm and comfortable  Respiratory system: Clear to auscultation. Respiratory effort normal. Cardiovascular system: S1 & S2 heard, RRR. No JVD, murmurs, rubs, gallops or clicks. No pedal edema. Gastrointestinal system: Abdomen is nondistended, soft and nontender. No organomegaly or masses felt. Normal bowel sounds heard. Central nervous system: Alert and oriented. No focal neurological deficits. Extremities: Symmetric 5 x 5 power. Skin: No rashes, lesions or ulcers Psychiatry: Judgement and insight appear normal. Mood & affect appropriate.     Data Reviewed: I have personally reviewed following labs and imaging studies  CBC: Recent Labs  Lab 03/24/20 1354 03/25/20 0345  WBC 7.6 6.4  NEUTROABS 5.7 4.4  HGB 8.9* 7.7*  HCT 28.9* 25.0*  MCV 96.0 97.7  PLT  210 299    Basic Metabolic Panel: Recent Labs  Lab 03/24/20 1354 03/24/20 1731 03/25/20 0345  NA 140  --  138  K 4.2  --  4.0  CL 102  --  104  CO2 26  --  26  GLUCOSE 123*  --  103*  BUN 66*  --  67*  CREATININE 4.00*  --  3.71*  CALCIUM 8.8*  --  7.9*  MG  --  2.5* 2.1  PHOS  --  1.4* 1.6*    GFR: CrCl cannot be calculated (Unknown ideal weight.).  Liver Function Tests: Recent Labs  Lab 03/24/20 1354 03/25/20 0345  AST 15 16  ALT 9 13  ALKPHOS 224* 166*  BILITOT 0.5 0.6  PROT 6.7 5.6*  ALBUMIN 2.6* 2.5*    CBG: No results for input(s): GLUCAP in the last 168 hours.   Recent Results (from the past 240 hour(s))  Resp Panel by RT-PCR (Flu A&B, Covid) Nasopharyngeal Swab     Status: None   Collection Time: 03/24/20  7:58 PM   Specimen: Nasopharyngeal Swab; Nasopharyngeal(NP) swabs in vial transport medium  Result Value Ref Range Status   SARS Coronavirus 2 by RT PCR NEGATIVE NEGATIVE Final    Comment: (NOTE) SARS-CoV-2 target nucleic acids are NOT DETECTED.  The SARS-CoV-2 RNA is generally detectable in upper respiratory specimens  during the acute phase of infection. The lowest concentration of SARS-CoV-2 viral copies this assay can detect is 138 copies/mL. A negative result does not preclude SARS-Cov-2 infection and should not be used as the sole basis for treatment or other patient management decisions. A negative result may occur with  improper specimen collection/handling, submission of specimen other than nasopharyngeal swab, presence of viral mutation(s) within the areas targeted by this assay, and inadequate number of viral copies(<138 copies/mL). A negative result must be combined with clinical observations, patient history, and epidemiological information. The expected result is Negative.  Fact Sheet for Patients:  EntrepreneurPulse.com.au  Fact Sheet for Healthcare Providers:  IncredibleEmployment.be  This test is no t yet approved or cleared by the [place] FDA and  has been authorized for detection and/or diagnosis of SARS-CoV-2 by FDA under an Emergency Use Authorization (EUA). This EUA will remain  in effect (meaning this test can be used) for the duration of the COVID-19 declaration under Section 564(b)(1) of the Act, 21 U.S.C.section 360bbb-3(b)(1), unless the authorization is terminated  or revoked sooner.       Influenza A by PCR NEGATIVE NEGATIVE Final   Influenza B by PCR NEGATIVE NEGATIVE Final    Comment: (NOTE) The Xpert Xpress SARS-CoV-2/FLU/RSV plus assay is intended as an aid in the diagnosis of influenza from Nasopharyngeal swab specimens and should not be used as a sole basis for treatment. Nasal washings and aspirates are unacceptable for Xpert Xpress SARS-CoV-2/FLU/RSV testing.  Fact Sheet for Patients: EntrepreneurPulse.com.au  Fact Sheet for Healthcare Providers: IncredibleEmployment.be  This test is not yet approved or cleared by the [place] FDA and has been authorized for detection  and/or diagnosis of SARS-CoV-2 by FDA under an Emergency Use Authorization (EUA). This EUA will remain in effect (meaning this test can be used) for the duration of the COVID-19 declaration under Section 564(b)(1) of the Act, 21 U.S.C. section 360bbb-3(b)(1), unless the authorization is terminated or revoked.  Performed at [facility], [place] [address] [place], [place] [address]          Radiology Studies: DG Chest 2 View  Result Date: 03/24/2020 CLINICAL DATA:  Increased fatigue and confusion with history of metastatic prostate cancer. EXAM: CHEST - 2 VIEW COMPARISON:  January 25, 2020 FINDINGS: The cardiac silhouette is moderately enlarged and unchanged in size. Both lungs are clear. A radiopaque surgical rod is seen throughout the left humerus. Sclerotic areas are again seen involving the osseous skeleton. IMPRESSION: 1. Stable cardiomegaly without active cardiopulmonary disease. 2. Findings consistent with known history of osseous metastasis. Electronically Signed   By: Virgina Norfolk M.D.   On: 03/24/2020 18:11   CT Head Wo Contrast  Result Date: 03/24/2020 CLINICAL DATA:  Mental status change of unknown cause. Increased fatigue and confusion EXAM: CT HEAD WITHOUT CONTRAST TECHNIQUE: Contiguous axial images were obtained from the base of the skull through the vertex without intravenous contrast. COMPARISON:  MRI of the brain from March 08, 2020 FINDINGS: Brain: No evidence of acute infarction, hemorrhage, hydrocephalus, extra-axial collection or mass lesion/mass effect. Signs of chronic microvascular ischemic change and atrophy as seen on recent MRI evaluation. Vascular: No hyperdense vessel or unexpected calcification. Skull: Normal. Negative for fracture or focal lesion. Sinuses/Orbits: Visualized paranasal sinuses and orbits are unremarkable. Other: None IMPRESSION: 1. No acute intracranial pathology. 2. Signs of chronic microvascular ischemic change and  atrophy as seen on recent MRI evaluation. Electronically Signed   By: Zetta Bills M.D.   On: 03/24/2020 19:20   US RENAL  Result Date: 03/25/2020 CLINICAL DATA:  Acute renal injury. EXAM: RENAL / URINARY TRACT ULTRASOUND COMPLETE COMPARISON:  Renal ultrasound 03/04/2020 and PET-CT 03/04/2020 FINDINGS: Right Kidney: Renal measurements: 10.4 x 4.5 x 5.0 cm = volume: 124 mL. Normal renal cortical thickness and echogenicity for age. No renal lesions, hydronephrosis or renal calculi. Left Kidney: Renal measurements: 8.6 x 4.3 x 4.8 cm = volume: 94 mL. Moderate renal cortical thinning but normal echogenicity. No renal lesions or hydronephrosis. No renal calculi. Bladder: Appears normal for degree of bladder distention. Other: None. IMPRESSION: 1. Moderate renal cortical thinning involving the left kidney. 2. No worrisome renal lesions or hydronephrosis. Electronically Signed   By: Marijo Sanes M.D.   On: 03/25/2020 12:55        Scheduled Meds: . aspirin EC  81 mg Oral Daily  . fentaNYL  1 patch Transdermal Q72H  . predniSONE  5 mg Oral Q breakfast  . senna  1 tablet Oral BID  . tamsulosin  0.4 mg Oral BID   Continuous Infusions: . sodium chloride 75 mL/hr (03/25/20 1051)  . potassium PHOSPHATE IVPB (in mmol) 30 mmol (03/25/20 1049)     LOS: 0 days    Time spent: 40 minutes    Irine Seal, MD [facility]   To contact the attending provider between 7A-7P or the covering provider during after hours 7P-7A, please log into the web site [email or web address] and access using universal [place] password for that web site. If you do not have the password, please call the hospital operator.  03/25/2020, 3:56 PM

## 2020-03-25 NOTE — Progress Notes (Signed)
Inpatient Diabetes Program Recommendations  AACE/ADA: New Consensus Statement on Inpatient Glycemic Control   Target Ranges:  Prepandial:   less than 140 mg/dL      Peak postprandial:   less than 180 mg/dL (1-2 hours)      Critically ill patients:  140 - 180 mg/dL  Results for RACER, QUAM (MRN [id number]) as of 03/25/2020 07:50  Ref. Range 03/24/2020 13:54 03/25/2020 03:45  Glucose Latest Ref Range: 70 - 99 mg/dL 123 (H) 103 (H)  Results for MAHMOOD, BOEHRINGER (MRN [id number]) as of 03/25/2020 07:50  Ref. Range 07/20/2017 21:03  Hemoglobin A1C Latest Ref Range: 4.8 - 5.6 % 4.5 (L)    Review of Glycemic Control  Diabetes history: No Outpatient Diabetes medications: NA Current orders for Inpatient glycemic control: None  NOTE: Noted consult for Diabetes Coordinator for uncontrolled DM. Diabetes Coordinator is not on campus but available by pager from 8am to 5pm for questions or concerns.  In reviewing the chart, noted patient does not have a history of DM. Patient takes Prednisone 5 mg daily as an outpatient. Last A1C was 4.5% on 07/20/2017. Lab glucose this morning was 103 mg/dl at 3:45 am. No recommendations for any orders regarding glycemic control at this time. Will sign off consult; please reconsult if future needs arise and provider feels it is necessary.  Thanks, Barnie Alderman, RN, MSN, CDE Diabetes Coordinator Inpatient Diabetes Program [phone] (Team Pager from 8am to 5pm)

## 2020-03-25 NOTE — Progress Notes (Signed)
OT Cancellation Note  Patient Details Name: Tony [name] MRN: [id number] DOB: 10/04/1930   Cancelled Treatment:    Reason Eval/Treat Not Completed: Other (comment) Patient just finishing with PT and up to chair. Will re-attempt as schedule permits.  Tony [name] OT OT pager: [phone]   Tony [name] 03/25/2020, 11:50 AM

## 2020-03-25 NOTE — Evaluation (Signed)
Physical Therapy Evaluation Patient Details Name: Tony [name] MRN: [id number] DOB: [birth year] Today's Date: 03/25/2020   History of Present Illness  Pt is a 86 y.o. male with medical history significant of vascular dementia, systolic CHF chronic, CKD stage IIIb, metastatic prostate cancer with mets to the bones,  severe osteoarthritis on narcotics, CAD.  Pt presented with increased confusion and fatigue. Pt admitted with AKI and anemia. Pt with multiple recent hospital admissions.  Clinical Impression   Pt admitted with above diagnosis. Pt required increased time and rest breaks but was able to perform transfers and room ambulation with min guard.  He did fatigue easily with knee pain that he reports is due to arthritis.  Pt was alert and oriented x 4 but is questionable historian due to dementia and provided vague PLOF.  Pt has necessary DME and He reports "someone is always available at home." Pt currently with functional limitations due to the deficits listed below (see PT Problem List). Pt will benefit from skilled PT to increase their independence and safety with mobility to allow discharge to the venue listed below.       Follow Up Recommendations Home health PT;Supervision/Assistance - 24 hour    Equipment Recommendations  None recommended by PT    Recommendations for Other Services       Precautions / Restrictions Precautions Precautions: Fall Restrictions Weight Bearing Restrictions: No      Mobility  Bed Mobility Overal bed mobility: Needs Assistance Bed Mobility: Supine to Sit     Supine to sit: Min guard     General bed mobility comments: increased time and encouragement with HOB elevated to come to sitting EOB    Transfers Overall transfer level: Needs assistance Equipment used: 4-wheeled walker Transfers: Sit to/from Stand Sit to Stand: Min guard         General transfer comment: Performed x 2; pt with use of brakes without  cues  Ambulation/Gait Ambulation/Gait assistance: Min guard Gait Distance (Feet): 15 Feet (15'x2) Assistive device: 4-wheeled walker Gait Pattern/deviations: Step-through pattern;Decreased stride length Gait velocity: decreased   General Gait Details: Fatigued easily and with flexed trunk - cued for posture but with fatigue pt preferred to lean on rollator with elbows. Reports limited due to knee pain from arthritis  Stairs            Wheelchair Mobility    Modified Rankin (Stroke Patients Only)       Balance Overall balance assessment: Needs assistance Sitting-balance support: Feet supported Sitting balance-Leahy Scale: Good     Standing balance support: Bilateral upper extremity supported;No upper extremity supported Standing balance-Leahy Scale: Fair Standing balance comment: Used RW for ambulation but able to perform toielting ADLS and wash hands without UE support                             Pertinent Vitals/Pain Pain Assessment: Faces Faces Pain Scale: Hurts little more Pain Location: bil knees Pain Descriptors / Indicators: Aching    Home Living Family/patient expects to be discharged to:: Private residence Living Arrangements: Children (daughter) Available Help at Discharge: Family;Personal care attendant;Available PRN/intermittently Type of Home: Apartment Home Access: Level entry     Home Layout: One level Home Equipment: Walker - 4 wheels;Wheelchair - [employer];Shower seat      Prior Function Level of Independence: Needs assistance   Gait / Transfers Assistance Needed: Used rollator for household ambulation  ADL's / Homemaking Assistance Needed: Pt  able to do toileting ADLs independently but had assistance lately with ADLs        Hand Dominance        Extremity/Trunk Assessment   Upper Extremity Assessment Upper Extremity Assessment: Generalized weakness    Lower Extremity Assessment Lower Extremity  Assessment: Generalized weakness    Cervical / Trunk Assessment Cervical / Trunk Assessment: Kyphotic  Communication      Cognition Arousal/Alertness: Awake/alert Behavior During Therapy: WFL for tasks assessed/performed Overall Cognitive Status: History of cognitive impairments - at baseline                                 General Comments: Pt A and O x4; somewhat vague details on PLOF - has hx of dementia but was able to follow commands and demonstrated good safety with use of brakes on rollator      General Comments      Exercises     Assessment/Plan    PT Assessment Patient needs continued PT services  PT Problem List Decreased strength;Decreased knowledge of use of DME;Decreased activity tolerance;Decreased safety awareness;Decreased knowledge of precautions;Decreased mobility;Decreased cognition;Decreased balance       PT Treatment Interventions DME instruction;Gait training;Functional mobility training;Therapeutic activities;Therapeutic exercise;Patient/family education;Balance training    PT Goals (Current goals can be found in the Care Plan section)  Acute Rehab PT Goals Patient Stated Goal: return home PT Goal Formulation: With patient Time For Goal Achievement: 04/08/20 Potential to Achieve Goals: Good    Frequency Min 3X/week   Barriers to discharge        Co-evaluation               AM-PAC PT "6 Clicks" Mobility  Outcome Measure Help needed turning from your back to your side while in a flat bed without using bedrails?: None Help needed moving from lying on your back to sitting on the side of a flat bed without using bedrails?: A Little Help needed moving to and from a bed to a chair (including a wheelchair)?: A Little Help needed standing up from a chair using your arms (e.g., wheelchair or bedside chair)?: A Little Help needed to walk in hospital room?: A Little Help needed climbing 3-5 steps with a railing? : A Lot 6 Click  Score: 18    End of Session Equipment Utilized During Treatment: Gait belt Activity Tolerance: Patient tolerated treatment well Patient left: in chair;with call bell/phone within reach;Other (comment);with chair alarm set (chair alarm set but no connection to call bell- notified RN; pt has been following commands and not attempting to get up on his own) Nurse Communication: Mobility status PT Visit Diagnosis: Unsteadiness on feet (R26.81);Difficulty in walking, not elsewhere classified (R26.2);Muscle weakness (generalized) (M62.81)    Time: 8502-7741 PT Time Calculation (min) (ACUTE ONLY): 38 min   Charges:   PT Evaluation $PT Eval Low Complexity: 1 Low PT Treatments $Gait Training: 8-22 mins $Therapeutic Activity: 8-22 mins        Abran Richard, PT Acute Rehab Services Pager [phone] [facility] 03/25/2020, 11:18 AM

## 2020-03-25 NOTE — Consult Note (Signed)
Consultation Note Date: 03/25/2020   Patient Name: Tony [name]  DOB: Aug 30, 1930  MRN: [id number]  Age / Sex: 86 y.o., male  PCP: [name], MD Referring Physician: Eugenie Filler, MD  Reason for Consultation: Establishing goals of care  HPI/Patient Profile: 86 y.o. male   admitted on 03/24/2020   Tony [name] is a 86 y.o. male with medical history significant of vascular dementia, systolic CHF chronic, CKD stage IIIb, metastatic prostate cancer with mets to the bones, severe osteoarthritis and CAD.  Clinical Assessment and Goals of Care: PMT was consulted in previous hospitalization. Patient admitted with increasing fatigue and confusion, has chronic pain, was admitted 3 weeks ago with AKI in the setting of CKD.   Palliative consulted for ongoing goals of care discussions.   Patient resting in bed, states that he is getting weak, has mostly been bed bound lately. Call placed and discussed with daughter [name]: Palliative medicine is specialized medical care for people living with serious illness. It focuses on providing relief from the symptoms and stress of a serious illness. The goal is to improve quality of life for both the patient and the family.  Goals of care: Broad aims of medical therapy in relation to the patient's values and preferences. Our aim is to provide medical care aimed at enabling patients to achieve the goals that matter most to them, given the circumstances of their particular medical situation and their constraints.   We discussed about his current status, recent decline and his acute and chronic conditions. See below.    NEXT OF KIN Daughter [name]  SUMMARY OF RECOMMENDATIONS   Agree with DO NOT RESUSCITATE. Continue current mode of care. Goals of care: Discussed with patient at bedside, additionally call placed and discussed with daughter.  Daughter recalls  meeting with palliative here in the hospital in a previous hospitalization.  She speaks frankly about the patient's ongoing functional decline.  She is not sure how the patient is going to be managed at home because he is by himself for a large period of time during the day.  He has had ongoing functional and cognitive decline, he has had ongoing urinary and fecal incontinence.  She is asking about blood transfusions.  We discussed about various options between home with hospice versus skilled nursing facility rehab attempt with palliative to follow.  She will continue to discuss this with the patient. Thank you for the consult.  Code Status/Advance Care Planning:  DNR    Symptom Management:      Palliative Prophylaxis:   Delirium Protocol   Psycho-social/Spiritual:   Desire for further Chaplaincy support:yes  Additional Recommendations: Caregiving  Support/Resources  Prognosis:   Unable to determine  Discharge Planning: To Be Determined      Primary Diagnoses: Present on Admission: . AKI (acute kidney injury) ([place]) . Vascular dementia ([place]) . Prostate cancer metastatic to bone ([place]) . Macrocytic anemia . Hyperlipidemia . HFrEF (heart failure with reduced ejection fraction) ([place]) . Essential hypertension . Coronary artery disease  involving native coronary artery of native heart without angina pectoris . Chronic kidney disease, stage 3b ([place]) . BPH associated with nocturia   I have reviewed the medical record, interviewed the patient and family, and examined the patient. The following aspects are pertinent.  Past Medical History:  Diagnosis Date  . BPH associated with nocturia    flomax 0.4--> 0.8 mg trial. nocturia if has coffee. some incontinence  . CAD (coronary artery disease)    LAD Stent 2012. 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   . Gout    no rx. apparently 1x in past  . History of stroke    no aspirin before stroke. no deficits.  slurred words at time of stroke  . Hyperlipidemia   . Hypertension    Social History   Socioeconomic History  . Marital status: Married    Spouse name: Not on file  . Number of children: Not on file  . Years of education: Not on file  . Highest education level: Not on file  Occupational History  . Not on file  Tobacco Use  . Smoking status: Former Smoker    Packs/day: 0.50    Years: 1.00    Pack years: 0.50    Types: Cigarettes    Quit date: 05/01/1952    Years since quitting: 67.9  . Smokeless tobacco: Never Used  Vaping Use  . Vaping Use: Never used  Substance and Sexual Activity  . Alcohol use: No  . Drug use: No  . Sexual activity: Not on file  Other Topics Concern  . Not on file  Social History Narrative   Married- lives separate from wife. 2 children. Daughter passed from heart attack.       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 in community afterwards in [place] Strain:   . Difficulty of Paying Living Expenses: Not on file  Food Insecurity:   . Worried About Charity fundraiser in the Last Year: Not on file  . Ran Out of Food in the Last Year: Not on file  Transportation Needs:   . Lack of Transportation (Medical): Not on file  . Lack of Transportation (Non-Medical): Not on file  Physical Activity:   . Days of Exercise per Week: Not on file  . Minutes of Exercise per Session: Not on file  Stress:   . Feeling of Stress : Not on file  Social Connections:   . Frequency of Communication with Friends and Family: Not on file  . Frequency of Social Gatherings with Friends and Family: Not on file  . Attends Religious Services: Not on file  . Active Member of Clubs or Organizations: Not on file  . Attends Archivist Meetings: Not on file  . Marital Status: Not on file   Family History  Problem Relation Age of Onset  . Breast cancer Mother   . Heart disease Father   . Heart disease Brother         x2  . Prostate cancer Brother    Scheduled Meds: . aspirin EC  81 mg Oral Daily  . fentaNYL  1 patch Transdermal Q72H  . heparin injection (subcutaneous)  5,000 Units Subcutaneous Q8H  . predniSONE  5 mg Oral Q breakfast  . senna  1 tablet Oral BID  . tamsulosin  0.4 mg Oral BID   Continuous Infusions: . sodium chloride 75 mL/hr (03/25/20  1051)  . potassium PHOSPHATE IVPB (in mmol) 30 mmol (03/25/20 1049)   PRN Meds:.acetaminophen **OR** acetaminophen, HYDROcodone-acetaminophen, polyethylene glycol Medications Prior to Admission:  Prior to Admission medications   Medication Sig Start Date End Date Taking? Authorizing Provider  aspirin EC 81 MG tablet Take 1 tablet (81 mg total) by mouth daily. 01/29/17  Yes [name], MD  BIDIL 20-37.5 MG tablet TAKE 1 TABLET BY MOUTH TWICE DAILY Patient taking differently: Take 1 tablet by mouth in the morning and at bedtime.  05/16/19  Yes [name], MD  calcium-vitamin D (OSCAL 500/200 D-3) 500-200 MG-UNIT tablet Take 1 tablet by mouth daily with breakfast. 08/09/17  Yes Brunetta Genera, MD  carvedilol (COREG) 12.5 MG tablet Take 1 tablet (12.5 mg total) by mouth 2 (two) times daily with a meal. 01/31/20  Yes Gonfa, Taye T, MD  docusate sodium (COLACE) 100 MG capsule Take 100 mg by mouth daily.   Yes [provider]  fentaNYL (DURAGESIC) 25 MCG/HR Place 1 patch onto the skin every 3 (three) days. 03/19/20  Yes Tanner, Lyndon Code., PA-C  furosemide (LASIX) 40 MG tablet Take 1 tablet (40 mg total) by mouth daily. Make take one-half additional tablet 20 mg by mouth as needed for swelling or weight gain of 3 pounds or more in a day or 5 pounds or more in a week. Patient taking differently: Take 40 mg by mouth daily.  02/20/20  Yes Kathyrn Drown D, NP  Multiple Vitamins-Minerals (MULTI COMPLETE PO) Take 1 tablet by mouth daily.    Yes [provider]  nitroGLYCERIN (NITROSTAT) 0.4 MG SL tablet Place 1 tablet (0.4 mg  total) under the tongue every 5 (five) minutes as needed for chest pain (3 maximum before seeking care). Patient taking differently: Place 0.4 mg under the tongue every 5 (five) minutes as needed for chest pain.  11/17/16  Yes Marin Olp, MD  Omega-3 1000 MG CAPS Take 1 capsule by mouth daily.    Yes [provider]  polyethylene glycol (MIRALAX) packet Take 17 g by mouth daily. Patient taking differently: Take 17 g by mouth daily as needed for moderate constipation.  09/06/17  Yes Brunetta Genera, MD  predniSONE (DELTASONE) 5 MG tablet Take 1 tablet (5 mg total) by mouth daily with breakfast. 01/31/20 10/27/20 Yes Mercy Riding, MD  tamsulosin (FLOMAX) 0.4 MG CAPS capsule TAKE 1 CAPSULE BY MOUTH TWICE DAILY Patient taking differently: Take 0.4 mg by mouth in the morning and at bedtime.  06/17/19  Yes [name], MD  vitamin B-12 (CYANOCOBALAMIN) 1000 MCG tablet Take 1 tablet (1,000 mcg total) by mouth daily. 07/22/17  Yes Mikhail, [place], DO  Vitamin D, Ergocalciferol, (DRISDOL) 1.25 MG (50000 UNIT) CAPS capsule Take 50,000 Units by mouth See admin instructions. Takes 3 times a week ,Monday, Wednesday and Friday   Yes [provider]  Nutritional Supplements (NEPRO) LIQD Take 237 mLs by mouth in the morning, at noon, and at bedtime. Patient not taking: Reported on 03/24/2020 03/18/20 04/17/20  [name], MD  senna-docusate (SENNA S) 8.6-50 MG tablet Take 2 tablets by mouth 2 (two) times daily. Patient not taking: Reported on 03/24/2020 04/17/19   Brunetta Genera, MD   Allergies  Allergen Reactions  . Gabapentin Other (See Comments)    Patient states caused hallucinations    Review of Systems Denies pain.  Admits to weakness  Physical Exam No distress Awake alert Regular work of breathing Generalized  weakness S1 S 2  Has some edema  Vital Signs: BP 128/72   Pulse 80   Temp 98.1 F (36.7 C) (Oral)   Resp 15   SpO2 99%  Pain Scale: 0-10     Pain Score: 0-No pain   SpO2: SpO2: 99 % O2 Device:SpO2: 99 % O2 Flow Rate: .   IO: Intake/output summary:   Intake/Output Summary (Last 24 hours) at 03/25/2020 1306 Last data filed at 03/25/2020 1045 Gross per 24 hour  Intake 561.94 ml  Output 300 ml  Net 261.94 ml    LBM:   Baseline Weight:   Most recent weight:       Palliative Assessment/Data:   PPS 40%  Time In:  1300 Time Out:  1400 Time Total:  60  Greater than 50%  of this time was spent counseling and coordinating care related to the above assessment and plan.  Signed by: Loistine Chance, MD   Please contact Palliative Medicine Team phone at [phone] for questions and concerns.  For individual provider: See Shea Evans

## 2020-03-26 DIAGNOSIS — N401 Enlarged prostate with lower urinary tract symptoms: Secondary | ICD-10-CM | POA: Diagnosis not present

## 2020-03-26 DIAGNOSIS — Z515 Encounter for palliative care: Secondary | ICD-10-CM | POA: Diagnosis not present

## 2020-03-26 DIAGNOSIS — R531 Weakness: Secondary | ICD-10-CM | POA: Diagnosis not present

## 2020-03-26 DIAGNOSIS — I10 Essential (primary) hypertension: Secondary | ICD-10-CM | POA: Diagnosis not present

## 2020-03-26 DIAGNOSIS — N179 Acute kidney failure, unspecified: Secondary | ICD-10-CM | POA: Diagnosis not present

## 2020-03-26 DIAGNOSIS — I2510 Atherosclerotic heart disease of native coronary artery without angina pectoris: Secondary | ICD-10-CM | POA: Diagnosis not present

## 2020-03-26 DIAGNOSIS — Z7189 Other specified counseling: Secondary | ICD-10-CM | POA: Diagnosis not present

## 2020-03-26 LAB — RENAL FUNCTION PANEL
Albumin: 2.6 g/dL — ABNORMAL LOW (ref 3.5–5.0)
Anion gap: 9 (ref 5–15)
BUN: 54 mg/dL — ABNORMAL HIGH (ref 8–23)
CO2: 25 mmol/L (ref 22–32)
Calcium: 7.3 mg/dL — ABNORMAL LOW (ref 8.9–10.3)
Chloride: 106 mmol/L (ref 98–111)
Creatinine, Ser: 3.07 mg/dL — ABNORMAL HIGH (ref 0.61–1.24)
GFR, Estimated: 19 mL/min — ABNORMAL LOW (ref >60)
Glucose, Bld: 96 mg/dL (ref 70–99)
Phosphorus: 2.3 mg/dL — ABNORMAL LOW (ref 2.5–4.6)
Potassium: 4.2 mmol/L (ref 3.5–5.1)
Sodium: 140 mmol/L (ref 135–145)

## 2020-03-26 LAB — CBC
HCT: 29.7 % — ABNORMAL LOW (ref 39.0–52.0)
Hemoglobin: 9.1 g/dL — ABNORMAL LOW (ref 13.0–17.0)
MCH: 29.9 pg (ref 26.0–34.0)
MCHC: 30.6 g/dL (ref 30.0–36.0)
MCV: 97.7 fL (ref 80.0–100.0)
Platelets: 196 10*3/uL (ref 150–400)
RBC: 3.04 MIL/uL — ABNORMAL LOW (ref 4.22–5.81)
RDW: 18.2 % — ABNORMAL HIGH (ref 11.5–15.5)
WBC: 6 10*3/uL (ref 4.0–10.5)
nRBC: 0 % (ref 0.0–0.2)

## 2020-03-26 LAB — MAGNESIUM: Magnesium: 2.1 mg/dL (ref 1.7–2.4)

## 2020-03-26 MED ORDER — SODIUM CHLORIDE 0.9 % IV SOLN: INTRAVENOUS | Status: DC

## 2020-03-26 MED ORDER — SODIUM CHLORIDE 0.9 % IV SOLN
75.0000 mL/h | INTRAVENOUS | Status: DC
  Administered 2020-03-26: 75 mL/h via INTRAVENOUS

## 2020-03-26 MED ORDER — ENSURE ENLIVE PO LIQD
237.0000 mL | Freq: Two times a day (BID) | ORAL | Status: DC
  Administered 2020-03-26 – 2020-03-27 (×2): 237 mL via ORAL

## 2020-03-26 NOTE — Progress Notes (Signed)
Initial Nutrition Assessment  DOCUMENTATION CODES:   Not applicable  INTERVENTION:  - will order Ensure Enlive BID, each supplement provides 350 kcal and 20 grams of protein. - will order Magic Cup with lunch meals, each supplement provides 290 kcal and 9 grams of protein - weigh patient today. - complete NFPE at follow-up.   NUTRITION DIAGNOSIS:   Increased nutrient needs related to cancer and cancer related treatments as evidenced by estimated needs.  GOAL:   Patient will meet greater than or equal to 90% of their needs  MONITOR:   PO intake, Supplement acceptance, Labs, Weight trends  REASON FOR ASSESSMENT:   Malnutrition Screening Tool, Consult Assessment of nutrition requirement/status  ASSESSMENT:   86 y.o. male with medical history of vascular dementia, CHF, stage 3 CKD, metastatic prostate cancer with mets to bones on steroids and zytiga is being held d/t concern that it caused CHF, severe osteoarthritis on narcotics, and CAD. He presented to the ED due to increased confusion and fatigue x1 week. He was hospitalized 3 weeks ago due to AKI.  Patient out of the room for ultrasound. He ate 100% of breakfast and 75% of lunch and dinner yesterday; 0% of breakfast yesterday.   Patient assessed remotely by [facility] RD on 11/6 at which time patient reported poor appetite and intakes. He was ordered Nepro supplements during that time and was drinking them.  He has not been weighed since 03/11/20 at which time he weighed 183 lb. Weight was up from weights in October. Will compare weight this admission once it is obtained.   Palliative Care is following and patient is DNR.   Per notes: - ARF on stage 3 CKD - acute metabolic encephalopathy with hx of vascular dementia--currently noted to be a/o x4 with memory impairment - prostate cancer with bone mets - from home with likely plan at time of d/c to be for SNF   Labs reviewed; BUN: 54 mg/dl, creatinine: 3.07 mg/dl, Ca: 7.3  mg/dl, Phos: 2.3 mg/d, GFR: 19 ml/min. Medications reviewed; 30 mmol IV KPhos x1 run 11/25, 5 mg deltasone/day, 1 tablet senokot BID.  IVF; NS @ 75 ml/hr.     NUTRITION - FOCUSED PHYSICAL EXAM:  unable to complete at this time.   Diet Order:   Diet Order            Diet Heart Room service appropriate? Yes; Fluid consistency: Thin  Diet effective now                 EDUCATION NEEDS:   No education needs have been identified at this time  Skin:  Skin Assessment: Reviewed RN Assessment  Last BM:  11/25 (type 2)  Height:   Ht Readings from Last 1 Encounters:  03/18/20 5\' 7"  (1.702 m)    Weight:   Wt Readings from Last 1 Encounters:  03/11/20 83.1 kg    Estimated Nutritional Needs:  Kcal:  1900-2150 kcal Protein:  95-105 grams Fluid:  >/= 1.8 L/day      Jarome Matin, MS, RD, LDN, CNSC Inpatient Clinical Dietitian RD pager # available in [facility]  After hours/weekend pager # available in [facility]

## 2020-03-26 NOTE — Progress Notes (Signed)
Daily Progress Note   Patient Name: Tony [name]       Date: 03/26/2020 DOB: 08/01/1930  Age: 86 y.o. MRN#: [id number] Attending Physician: Eugenie Filler, MD Primary Care Physician: [name], MD Admit Date: 03/24/2020  Reason for Consultation/Follow-up: Establishing goals of care  Subjective:  awake alert, sitting in chair, but states he would like to get back in bed, he states that he did not rest well overnight, patient wants to know how his kidneys are doing and what his Hgb is. I pulled up his labs from room computer and shared with him. Overall, he thinks he is feeling some what better today.   Length of Stay: 1  Current Medications: Scheduled Meds:  . aspirin EC  81 mg Oral Daily  . fentaNYL  1 patch Transdermal Q72H  . predniSONE  5 mg Oral Q breakfast  . senna  1 tablet Oral BID  . tamsulosin  0.4 mg Oral BID    Continuous Infusions: . sodium chloride 75 mL/hr (03/26/20 0844)    PRN Meds: acetaminophen **OR** acetaminophen, HYDROcodone-acetaminophen, polyethylene glycol  Physical Exam         Awake alert No distress Resting in chair Has some edema S 1 S 2  Regular work of breathing Abdomen not distended Non focal.   Vital Signs: BP 121/67   Pulse 83   Temp 98.6 F (37 C)   Resp 18   SpO2 97%  SpO2: SpO2: 97 % O2 Device: O2 Device: Room Air O2 Flow Rate:    Intake/output summary:   Intake/Output Summary (Last 24 hours) at 03/26/2020 1155 Last data filed at 03/26/2020 0900 Gross per 24 hour  Intake 1836 ml  Output 500 ml  Net 1336 ml   LBM:   Baseline Weight:   Most recent weight:         Palliative Assessment/Data:      Patient Active Problem List   Diagnosis Date Noted  . Acute renal failure superimposed on chronic kidney  disease ([place])   . Hypophosphatemia   . AKI (acute kidney injury) ([place]) 03/24/2020  . Vascular dementia ([place]) 03/18/2020  . DNR (do not resuscitate)   . HFrEF (heart failure with reduced ejection fraction) ([place]) 01/26/2020  . Osteoarthritis of knee 07/16/2019  . Prostate cancer metastatic to bone [facility])  08/07/2017  . Macrocytic anemia 07/20/2017  . Thrombocytopenia ([place]) 07/20/2017  . History of complete AV block 10/30/2016  . Chronic kidney disease, stage 3b ([place]) 10/30/2016  . Essential hypertension   . Hyperlipidemia   . BPH associated with nocturia   . Coronary artery disease involving native coronary artery of native heart without angina pectoris   . History of stroke   . Gout     Palliative Care Assessment & Plan   Patient Profile:    Assessment:  vascular dementia systolic CHF CKD III metastatic prostate cancer to bone, also has declining functional status recently.   Recommendations/Plan:   continue current pain and non pain symptom management.   Monitor hospital course, participation with PT OT and PO intake.   Recommend SNF rehab with palliative care to follow, if this can be arranged. Patient came from home, currently had some home health prior to this hospitalization.    Code Status:    Code Status Orders  (From admission, onward)         Start     Ordered   03/24/20 2154  Do not attempt resuscitation (DNR)  Continuous       Question Answer Comment  In the event of cardiac or respiratory ARREST Do not call a "code blue"   In the event of cardiac or respiratory ARREST Do not perform Intubation, CPR, defibrillation or ACLS   In the event of cardiac or respiratory ARREST Use medication by any route, position, wound care, and other measures to relive pain and suffering. May use oxygen, suction and manual treatment of airway obstruction as needed for comfort.      03/24/20 2153        Code Status History    Date Active Date Inactive Code Status Order ID  Comments User Context   03/04/2020 1905 03/12/2020 1752 DNR [id number]  Bonnielee Haff, MD ED   01/30/2020 1020 02/01/2020 0127 DNR [id number]  Basilio Cairo, NP Inpatient   01/26/2020 0142 01/30/2020 1019 Full Code [id number]  Vernelle Emerald, MD ED   07/20/2017 2021 07/22/2017 2144 Full Code [id number]  Wouk, Ailene Rud, MD Inpatient   Advance Care Planning Activity    Advance Directive Documentation     Most Recent Value  Type of Advance Directive Healthcare Power of Attorney, Living will, Out of facility DNR (pink MOST or yellow form)  Pre-existing out of facility DNR order (yellow form or pink MOST form) --  "MOST" Form in Place? --       Prognosis:   Unable to determine  Discharge Planning:  To Be Determined  Care plan was discussed with  Patient and TR[facility] MD.   Thank you for allowing the Palliative Medicine Team to assist in the care of this patient.   Time In: 9 Time Out: 9.25 Total Time 25 Prolonged Time Billed  no       Greater than 50%  of this time was spent counseling and coordinating care related to the above assessment and plan.  Loistine Chance, MD  Please contact Palliative Medicine Team phone at [phone] for questions and concerns.

## 2020-03-26 NOTE — Progress Notes (Addendum)
PROGRESS NOTE    Tony HUGE  [name]:[id number] DOB: [birth year] DOA: 03/24/2020 PCP: [name], MD    Chief Complaint  Patient presents with  . Abnormal Lab    Brief Narrative:  HPI per Dr.Doutova Tony [name] is a 86 y.o. male with medical history significant of vascular dementia, systolic CHF chronic, CKD stage IIIb, metastatic prostate cancer with mets to the bones, severe osteoarthritis on narcotics, CAD    Presented with   increased confusion and fatigue at home for the past 1 week he went into get his blood work checked because before he have had an episode of anemia His blood counts were good but his creatinine noted to go up to 4.0 from baseline around 3 He has been having trouble getting out of bed and had generalized fatigue he has been more confused repeating questions over and over again. He has chronic pain and has recently been started on fentanyl patch. He was seen by nephrology today and was told to come to emergency department He was still taking Lasix 40 mg a day for his heart failure  He have not had increased swelling or dyspnea Family having trouble with feeding him  3 weeks ago was admitted for AKI in the setting of CKD was rehydrated Noted to be anemic with hemoglobin down to 7.5 he was transfused 1 unit no evidence of active bleeding  Regarding patient's history of metastatic prostate cancer he is currently on steroids and his [name] has been held because he felt it was causing congestive heart failure.   Infectious risk factors:  Reports none   Has  been vaccinated against COVID due to get the booster   Initial COVID TEST  NEGATIVE   Assessment & Plan:   Principal Problem:   Acute renal failure superimposed on chronic kidney disease ([place]) Active Problems:   Essential hypertension   Hyperlipidemia   BPH associated with nocturia   Coronary artery disease involving native coronary artery of native heart without angina pectoris    History of stroke   Chronic kidney disease, stage 3b (HCC)   Macrocytic anemia   Prostate cancer metastatic to bone (HCC)   HFrEF (heart failure with reduced ejection fraction) (HCC)   Vascular dementia (HCC)   AKI (acute kidney injury) ([place])   Hypophosphatemia   1 acute renal failure on chronic kidney disease stage IIIb Baseline creatinine approximately 2.8.  Patient noted to have had a progressive decline in his kidney function. Acute renal failure likely secondary to a prerenal azotemia in the setting of diuretics.  Urine sodium of 39.  Urine creatinine of 118.14.  Urinalysis nitrite negative leukocytes negative, negative for protein.  Renal ultrasound negative for hydronephrosis.  Patient on gentle hydration with improvement with renal function.  Creatinine currently at 3.07 from 3.71 from 4.0 on admission.  Decrease IV fluids to 50 cc/h.  Supportive care.  Follow.   2.  Hypophosphatemia Improved.  K-Phos 30 mmol IV x1.  Repeat labs in the morning.   3.  Acute metabolic encephalopathy Questionable etiology.  Likely secondary to problem #1 and dehydration.  Improved.  Alert and oriented x3.  Likely at baseline.  Follow.   4.  Vascular dementia Patient with some sundowning and delirium on occasion.  Currently alert and oriented.  Stable.  Follow.  5.  Prostate cancer with metastases to the bone Oncology notified of patient's admission via epic.  Palliative care consulted for goals of care.  Per oncology.  6.  Hyperlipidemia Continue Zetia.  7.  Normocytic anemia anemia/anemia of chronic disease Patient with no overt bleeding.  Hemoglobin currently at 9.1 from 7.7.  Anemia in the setting of metastatic prostate cancer.  Anemia panel from January 27, 2020 with iron level of 43, TIBC of 159, ferritin of May 02, 2006, folate of 26.7.  Transfusion threshold hemoglobin < 7-7.5.  Patient with no overt bleeding.  Discontinued heparin.  Follow.  8.  Chronic systolic heart  failure Patient noted to be more on the dry side on admission and as such diuretics were held and patient placed on gentle hydration.  Decrease IV fluids to 50 cc/h.  Continue to hold Lasix.  Monitor closely for volume overload.  9.  Hypertension Patient noted to have borderline soft blood pressures on presentation.  Permissive hypertension.  Continue to hold Lasix.  Follow.  10.  Coronary artery disease Stable.  Continue to hold diuretics due to borderline blood pressure and worsening renal function.  Aspirin.  Supportive care.  Follow.   11.  BPH Continue Flomax.      DVT prophylaxis: SCDs>>> anemia Code Status: DNR Family Communication: Updated patient. Updated daughter, [name] on the telephone.     Disposition:   Status is: Inpatient    Dispo: The patient is from: Home              Anticipated d/c is to: SNF with palliative care following versus home with home health, to be determined              Anticipated d/c date is: 2 to 3 days.              Patient currently in acute on chronic kidney disease on gentle hydration, history of prostate cancer with mets to the bone, not stable for discharge.       Consultants:   Palliative care: Dr. Rowe Pavy March 25, 2020  Oncology Dr. [name] notified via epic of admission.  Procedures:   Renal ultrasound March 25, 2020  Chest x-ray March 24, 2020  CT head March 24, 2020    Antimicrobials:   None   Subjective: Patient sleeping but arousable.  Denies any chest pain no shortness of breath.  Some improvement with fatigue.  Denies any further urinary incontinence after holding Lasix.  No chest pain.  No shortness of breath.  Denies any bleeding.  Hopeful to go home and does not want to go to a SNF.   Objective: Vitals:   03/25/20 0616 03/25/20 1405 03/25/20 1950 03/26/20 0638  BP: 128/72 113/61 (!) 152/77 121/67  Pulse: 80 87 79 83  Resp:  18 18 18   Temp: 98.1 F (36.7 C) 98.1 F (36.7 C) 97.9 F (36.6  C) 98.6 F (37 C)  TempSrc: Oral Oral    SpO2: 99% 100% 99% 97%    Intake/Output Summary (Last 24 hours) at 03/26/2020 1122 Last data filed at 03/26/2020 0900 Gross per 24 hour  Intake 1836 ml  Output 500 ml  Net 1336 ml   There were no vitals filed for this visit.  Examination:  General exam: NAD Respiratory system: CTA B.  No wheezes, no crackles, no rhonchi.  Normal respiratory effort.  Cardiovascular system: Regular rate rhythm no murmurs rubs or gallops.  No JVD.  No lower extremity edema.  Gastrointestinal system: Abdomen is soft, nontender, distended, positive bowel sounds.  No rebound.  No guarding.  Central nervous system: Alert and oriented.  No focal neurological deficits.  Extremities: Symmetric  5 x 5 power. Skin: No rashes, lesions or ulcers Psychiatry: Judgement and insight appear normal. Mood & affect appropriate.     Data Reviewed: I have personally reviewed following labs and imaging studies  CBC: Recent Labs  Lab 03/24/20 1354 03/25/20 0345 03/26/20 0316  WBC 7.6 6.4 6.0  NEUTROABS 5.7 4.4  --   HGB 8.9* 7.7* 9.1*  HCT 28.9* 25.0* 29.7*  MCV 96.0 97.7 97.7  PLT 210 188 295    Basic Metabolic Panel: Recent Labs  Lab 03/24/20 1354 03/24/20 1731 03/25/20 0345 03/26/20 0316  NA 140  --  138 140  K 4.2  --  4.0 4.2  CL 102  --  104 106  CO2 26  --  26 25  GLUCOSE 123*  --  103* 96  BUN 66*  --  67* 54*  CREATININE 4.00*  --  3.71* 3.07*  CALCIUM 8.8*  --  7.9* 7.3*  MG  --  2.5* 2.1 2.1  PHOS  --  1.4* 1.6* 2.3*    GFR: CrCl cannot be calculated (Unknown ideal weight.).  Liver Function Tests: Recent Labs  Lab 03/24/20 1354 03/25/20 0345 03/26/20 0316  AST 15 16  --   ALT 9 13  --   ALKPHOS 224* 166*  --   BILITOT 0.5 0.6  --   PROT 6.7 5.6*  --   ALBUMIN 2.6* 2.5* 2.6*    CBG: No results for input(s): GLUCAP in the last 168 hours.   Recent Results (from the past 240 hour(s))  Resp Panel by RT-PCR (Flu A&B, Covid)  Nasopharyngeal Swab     Status: None   Collection Time: 03/24/20  7:58 PM   Specimen: Nasopharyngeal Swab; Nasopharyngeal(NP) swabs in vial transport medium  Result Value Ref Range Status   SARS Coronavirus 2 by RT PCR NEGATIVE NEGATIVE Final    Comment: (NOTE) SARS-CoV-2 target nucleic acids are NOT DETECTED.  The SARS-CoV-2 RNA is generally detectable in upper respiratory specimens during the acute phase of infection. The lowest concentration of SARS-CoV-2 viral copies this assay can detect is 138 copies/mL. A negative result does not preclude SARS-Cov-2 infection and should not be used as the sole basis for treatment or other patient management decisions. A negative result may occur with  improper specimen collection/handling, submission of specimen other than nasopharyngeal swab, presence of viral mutation(s) within the areas targeted by this assay, and inadequate number of viral copies(<138 copies/mL). A negative result must be combined with clinical observations, patient history, and epidemiological information. The expected result is Negative.  Fact Sheet for Patients:  EntrepreneurPulse.com.au  Fact Sheet for Healthcare Providers:  IncredibleEmployment.be  This test is no t yet approved or cleared by the [place] FDA and  has been authorized for detection and/or diagnosis of SARS-CoV-2 by FDA under an Emergency Use Authorization (EUA). This EUA will remain  in effect (meaning this test can be used) for the duration of the COVID-19 declaration under Section 564(b)(1) of the Act, 21 U.S.C.section 360bbb-3(b)(1), unless the authorization is terminated  or revoked sooner.       Influenza A by PCR NEGATIVE NEGATIVE Final   Influenza B by PCR NEGATIVE NEGATIVE Final    Comment: (NOTE) The Xpert Xpress SARS-CoV-2/FLU/RSV plus assay is intended as an aid in the diagnosis of influenza from Nasopharyngeal swab specimens and should not be  used as a sole basis for treatment. Nasal washings and aspirates are unacceptable for Xpert Xpress SARS-CoV-2/FLU/RSV testing.  Fact Sheet for  Patients: EntrepreneurPulse.com.au  Fact Sheet for Healthcare Providers: IncredibleEmployment.be  This test is not yet approved or cleared by the [place] FDA and has been authorized for detection and/or diagnosis of SARS-CoV-2 by FDA under an Emergency Use Authorization (EUA). This EUA will remain in effect (meaning this test can be used) for the duration of the COVID-19 declaration under Section 564(b)(1) of the Act, 21 U.S.C. section 360bbb-3(b)(1), unless the authorization is terminated or revoked.  Performed at [facility], [place] [address] [place], [place] [address]          Radiology Studies: DG Chest 2 View  Result Date: 03/24/2020 CLINICAL DATA:  Increased fatigue and confusion with history of metastatic prostate cancer. EXAM: CHEST - 2 VIEW COMPARISON:  January 25, 2020 FINDINGS: The cardiac silhouette is moderately enlarged and unchanged in size. Both lungs are clear. A radiopaque surgical rod is seen throughout the left humerus. Sclerotic areas are again seen involving the osseous skeleton. IMPRESSION: 1. Stable cardiomegaly without active cardiopulmonary disease. 2. Findings consistent with known history of osseous metastasis. Electronically Signed   By: Virgina Norfolk M.D.   On: 03/24/2020 18:11   CT Head Wo Contrast  Result Date: 03/24/2020 CLINICAL DATA:  Mental status change of unknown cause. Increased fatigue and confusion EXAM: CT HEAD WITHOUT CONTRAST TECHNIQUE: Contiguous axial images were obtained from the base of the skull through the vertex without intravenous contrast. COMPARISON:  MRI of the brain from March 08, 2020 FINDINGS: Brain: No evidence of acute infarction, hemorrhage, hydrocephalus, extra-axial collection or mass lesion/mass effect. Signs  of chronic microvascular ischemic change and atrophy as seen on recent MRI evaluation. Vascular: No hyperdense vessel or unexpected calcification. Skull: Normal. Negative for fracture or focal lesion. Sinuses/Orbits: Visualized paranasal sinuses and orbits are unremarkable. Other: None IMPRESSION: 1. No acute intracranial pathology. 2. Signs of chronic microvascular ischemic change and atrophy as seen on recent MRI evaluation. Electronically Signed   By: Zetta Bills M.D.   On: 03/24/2020 19:20   US RENAL  Result Date: 03/25/2020 CLINICAL DATA:  Acute renal injury. EXAM: RENAL / URINARY TRACT ULTRASOUND COMPLETE COMPARISON:  Renal ultrasound 03/04/2020 and PET-CT 03/04/2020 FINDINGS: Right Kidney: Renal measurements: 10.4 x 4.5 x 5.0 cm = volume: 124 mL. Normal renal cortical thickness and echogenicity for age. No renal lesions, hydronephrosis or renal calculi. Left Kidney: Renal measurements: 8.6 x 4.3 x 4.8 cm = volume: 94 mL. Moderate renal cortical thinning but normal echogenicity. No renal lesions or hydronephrosis. No renal calculi. Bladder: Appears normal for degree of bladder distention. Other: None. IMPRESSION: 1. Moderate renal cortical thinning involving the left kidney. 2. No worrisome renal lesions or hydronephrosis. Electronically Signed   By: Marijo Sanes M.D.   On: 03/25/2020 12:55        Scheduled Meds: . aspirin EC  81 mg Oral Daily  . fentaNYL  1 patch Transdermal Q72H  . predniSONE  5 mg Oral Q breakfast  . senna  1 tablet Oral BID  . tamsulosin  0.4 mg Oral BID   Continuous Infusions: . sodium chloride 75 mL/hr (03/26/20 0844)     LOS: 1 day    Time spent: 40 minutes    Irine Seal, MD [facility]   To contact the attending provider between 7A-7P or the covering provider during after hours 7P-7A, please log into the web site [email or web address] and access using universal [place] password for that web site. If you do not have the password, please call  the hospital operator.  03/26/2020, 11:22 AM

## 2020-03-26 NOTE — Evaluation (Signed)
Occupational Therapy Evaluation Patient Details Name: Tony [name] MRN: [id number] DOB: [birth year] Today's Date: 03/26/2020    History of Present Illness Pt is a 86 y.o. male with medical history significant of vascular dementia, systolic CHF chronic, CKD stage IIIb, metastatic prostate cancer with mets to the bones,  severe osteoarthritis on narcotics, CAD.  Pt presented with increased confusion and fatigue. Pt admitted with AKI and anemia. Pt with multiple recent hospital admissions.   Clinical Impression   [name] is a pleasant soft spoken 86 year old man who normally lives at home, ambulates with Rw and has assistance from caretakers. On evaluation patient min assist for supine to sit, min guard for ambulation with RW and set up to min guard for ADLs with mild balance deficits, generalized weakness and decreased activity tolerance.. Patient able to ambulate to bathroom and lean on sink to perform grooming tasks. Patient will benefit from skilled OT services while in hospital to improve deficits and learn compensatory strategies as needed in order to return home at discharge.  Patient near his baseline and should be able to return home at discharge.    Follow Up Recommendations  No OT follow up;Supervision/Assistance - 24 hour    Equipment Recommendations  None recommended by OT    Recommendations for Other Services       Precautions / Restrictions Precautions Precautions: Fall Restrictions Weight Bearing Restrictions: No      Mobility Bed Mobility Overal bed mobility: Needs Assistance Bed Mobility: Supine to Sit     Supine to sit: Min assist;HOB elevated     General bed mobility comments: Hand hold to pull himself into sitting.    Transfers Overall transfer level: Needs assistance Equipment used: Rolling walker (2 wheeled) Transfers: Sit to/from [facility] Sit to Stand: Min guard Stand pivot transfers: Min guard       General transfer  comment: Min guard to ambulate to bathroom with RW, stand at sink and return to recliner. Gait slow (OA in knees) but safe use of RW.    Balance Overall balance assessment: Mild deficits observed, not formally tested                                         ADL either performed or assessed with clinical judgement   ADL Overall ADL's : Needs assistance/impaired Eating/Feeding: Set up;Sitting   Grooming: Standing;Wash/dry hands;Wash/dry face Grooming Details (indicate cue type and reason): Stood at sink to perform grooming. Rested on elbows to perform task. Upper Body Bathing: Supervision/ safety;Sitting   Lower Body Bathing: Sitting/lateral leans;Sit to/from stand;Min guard   Upper Body Dressing : Set up;Sitting   Lower Body Dressing: Min guard;Sit to/from stand Lower Body Dressing Details (indicate cue type and reason): able to don socks in sitting, demonstrates ability to manage clothing with sit to stand. Toilet Transfer: Pharmacist, community and Hygiene: Min guard;Sit to/from stand       Functional mobility during ADLs: Min guard;Rolling walker       Vision   Vision Assessment?: No apparent visual deficits     Perception     Praxis      Pertinent Vitals/Pain Pain Assessment: Faces Faces Pain Scale: Hurts little more Pain Location: bil knees Pain Descriptors / Indicators: Aching Pain Intervention(s): Monitored during session;Repositioned     Hand Dominance     Extremity/Trunk Assessment  Upper Extremity Assessment Upper Extremity Assessment: Overall WFL for tasks assessed   Lower Extremity Assessment Lower Extremity Assessment: Defer to PT evaluation   Cervical / Trunk Assessment Cervical / Trunk Assessment: Kyphotic   Communication Communication Communication: No difficulties   Cognition Arousal/Alertness: Awake/alert Behavior During Therapy: WFL for tasks assessed/performed Overall  Cognitive Status: History of cognitive impairments - at baseline                                 General Comments: Patient alert and oriented x 4. Able to answer questions in regards to PLOF. hx of dementia but able to follow all commands and exhibits safety mobility with RW.   General Comments       Exercises     Shoulder Instructions      Home Living Family/patient expects to be discharged to:: Private residence Living Arrangements: Children (daughter) Available Help at Discharge: Family;Personal care attendant;Available PRN/intermittently Type of Home: Apartment Home Access: Level entry     Home Layout: One level     Bathroom Shower/Tub: Teacher, early years/pre: Standard Bathroom Accessibility: Yes How Accessible: Accessible via walker Home Equipment: [place] - 4 wheels;Wheelchair - [employer];Toilet riser;Tub bench          Prior Functioning/Environment Level of Independence: Needs assistance  Gait / Transfers Assistance Needed: Used rollator for household ambulation ADL's / Homemaking Assistance Needed: Pt able to do toileting ADLs independently but had assistance lately with ADLs   Comments: Reports having a CNA that comes and assists with IADLs.        OT Problem List: Decreased strength;Decreased activity tolerance;Impaired balance (sitting and/or standing);Decreased safety awareness      OT Treatment/Interventions: Self-care/ADL training;Therapeutic exercise;DME and/or AE instruction;Energy conservation;Therapeutic activities;Patient/family education;Balance training    OT Goals(Current goals can be found in the care plan section) Acute Rehab OT Goals Patient Stated Goal: return home OT Goal Formulation: With patient Time For Goal Achievement: 04/09/20 Potential to Achieve Goals: Good  OT Frequency: Min 1X/week   Barriers to D/C:            Co-evaluation              AM-PAC OT "6 Clicks" Daily Activity      Outcome Measure Help from another person eating meals?: A Little Help from another person taking care of personal grooming?: A Little Help from another person toileting, which includes using toliet, bedpan, or urinal?: A Little Help from another person bathing (including washing, rinsing, drying)?: A Little Help from another person to put on and taking off regular upper body clothing?: A Little Help from another person to put on and taking off regular lower body clothing?: A Little 6 Click Score: 18   End of Session Equipment Utilized During Treatment: Gait belt;Rolling walker Nurse Communication: Mobility status  Activity Tolerance: Patient tolerated treatment well Patient left: in chair;with call bell/phone within reach  OT Visit Diagnosis: Other abnormalities of gait and mobility (R26.89);Muscle weakness (generalized) (M62.81)                Time: 8416-6063 OT Time Calculation (min): 19 min Charges:  OT General Charges $OT Visit: 1 Visit OT Evaluation $OT Eval Moderate Complexity: 1 Mod  Jhovani Griswold, OTR/L [place]  Office [phone] Pager: [place] 03/26/2020, 9:14 AM

## 2020-03-27 DIAGNOSIS — N401 Enlarged prostate with lower urinary tract symptoms: Secondary | ICD-10-CM | POA: Diagnosis not present

## 2020-03-27 DIAGNOSIS — I10 Essential (primary) hypertension: Secondary | ICD-10-CM | POA: Diagnosis not present

## 2020-03-27 DIAGNOSIS — I2510 Atherosclerotic heart disease of native coronary artery without angina pectoris: Secondary | ICD-10-CM | POA: Diagnosis not present

## 2020-03-27 DIAGNOSIS — N179 Acute kidney failure, unspecified: Secondary | ICD-10-CM | POA: Diagnosis not present

## 2020-03-27 LAB — RENAL FUNCTION PANEL
Albumin: 2.4 g/dL — ABNORMAL LOW (ref 3.5–5.0)
Anion gap: 8 (ref 5–15)
BUN: 39 mg/dL — ABNORMAL HIGH (ref 8–23)
CO2: 22 mmol/L (ref 22–32)
Calcium: 6.8 mg/dL — ABNORMAL LOW (ref 8.9–10.3)
Chloride: 110 mmol/L (ref 98–111)
Creatinine, Ser: 2.42 mg/dL — ABNORMAL HIGH (ref 0.61–1.24)
GFR, Estimated: 25 mL/min — ABNORMAL LOW (ref >60)
Glucose, Bld: 104 mg/dL — ABNORMAL HIGH (ref 70–99)
Phosphorus: 1.7 mg/dL — ABNORMAL LOW (ref 2.5–4.6)
Potassium: 4.3 mmol/L (ref 3.5–5.1)
Sodium: 140 mmol/L (ref 135–145)

## 2020-03-27 LAB — HEMOGLOBIN AND HEMATOCRIT, BLOOD
HCT: 26.2 % — ABNORMAL LOW (ref 39.0–52.0)
Hemoglobin: 7.9 g/dL — ABNORMAL LOW (ref 13.0–17.0)

## 2020-03-27 MED ORDER — POTASSIUM & SODIUM PHOSPHATES 280-160-250 MG PO PACK
1.0000 | PACK | Freq: Three times a day (TID) | ORAL | Status: AC
  Administered 2020-03-27 – 2020-03-28 (×3): 1 via ORAL
  Filled 2020-03-27 (×3): qty 1

## 2020-03-27 NOTE — Progress Notes (Signed)
PROGRESS NOTE    Tony [name]  FIE:[id number] DOB: [birth year] DOA: 03/24/2020 PCP: [name], MD    Chief Complaint  Patient presents with  . Abnormal Lab    Brief Narrative:  HPI per Dr.Doutova Tony [name] is a 86 y.o. male with medical history significant of vascular dementia, systolic CHF chronic, CKD stage IIIb, metastatic prostate cancer with mets to the bones, severe osteoarthritis on narcotics, CAD    Presented with   increased confusion and fatigue at home for the past 1 week he went into get his blood work checked because before he have had an episode of anemia His blood counts were good but his creatinine noted to go up to 4.0 from baseline around 3 He has been having trouble getting out of bed and had generalized fatigue he has been more confused repeating questions over and over again. He has chronic pain and has recently been started on fentanyl patch. He was seen by nephrology today and was told to come to emergency department He was still taking Lasix 40 mg a day for his heart failure  He have not had increased swelling or dyspnea Family having trouble with feeding him  3 weeks ago was admitted for AKI in the setting of CKD was rehydrated Noted to be anemic with hemoglobin down to 7.5 he was transfused 1 unit no evidence of active bleeding  Regarding patient's history of metastatic prostate cancer he is currently on steroids and his [name] has been held because he felt it was causing congestive heart failure.   Infectious risk factors:  Reports none   Has  been vaccinated against COVID due to get the booster   Initial COVID TEST  NEGATIVE   Assessment & Plan:   Principal Problem:   Acute renal failure superimposed on chronic kidney disease ([place]) Active Problems:   Essential hypertension   Hyperlipidemia   BPH associated with nocturia   Coronary artery disease involving native coronary artery of native heart without angina pectoris    History of stroke   Chronic kidney disease, stage 3b (HCC)   Macrocytic anemia   Prostate cancer metastatic to bone (HCC)   HFrEF (heart failure with reduced ejection fraction) (HCC)   Vascular dementia (HCC)   AKI (acute kidney injury) ([place])   Hypophosphatemia   1 acute renal failure on chronic kidney disease stage IIIb Baseline creatinine approximately 2.8.  Patient noted to have had a progressive decline in his kidney function. Acute renal failure likely secondary to a prerenal azotemia in the setting of diuretics.  Urine sodium of 39.  Urine creatinine of 118.14.  Urinalysis nitrite negative leukocytes negative, negative for protein.  Renal ultrasound negative for hydronephrosis.  Patient on gentle hydration with improvement with renal function.  Creatinine currently at 2.42 from 3.07 from 3.71 from 4.0 on admission.  Saline lock IV fluids this evening.  Supportive care.  Outpatient follow-up with nephrology.    2.  Hypophosphatemia Initially improved however phosphorus currently at 1.7.  Replete.  Follow.   3.  Acute metabolic encephalopathy Questionable etiology.  Improved clinically.  Likely secondary to problem #1 and dehydration.  Likely at baseline.  Follow.  4.  Vascular dementia Patient with some sundowning and delirium on occasion.  Currently alert and oriented.  Stable.  Follow.  5.  Prostate cancer with metastases to the bone Oncology notified of patient's admission via epic.  Palliative care consulted for goals of care.  Per oncology.  6.  Hyperlipidemia  Zetia.  7.  Normocytic anemia anemia/anemia of chronic disease Patient with no overt bleeding.  Hemoglobin currently at 7.9 from 9.1 from 7.7.  Anemia in the setting of metastatic prostate cancer.  Patient with no overt bleeding.  Anemia panel from January 27, 2020 with iron level of 43, TIBC of 159, ferritin of May 02, 2006, folate of 26.7.  Transfusion threshold hemoglobin < 7-7.5.  Patient with no overt bleeding.   Discontinued heparin.  Follow.  8.  Chronic systolic heart failure Patient noted to be more on the dry side on admission and as such diuretics were held and patient placed on gentle hydration.  Saline lock IV fluids this evening.  Continue to hold diuretics.  Follow.   9.  Hypertension Patient noted to have borderline soft blood pressures on presentation.  Blood pressure improving with gentle hydration.  Continue to hold diuretics.  Follow.   10.  Coronary artery disease Stable.  Continue to hold diuretics due to worsening renal function on admission and borderline blood pressure.  Continue aspirin.  Supportive care.  Follow.   11.  BPH Flomax.     DVT prophylaxis: SCDs>>> anemia Code Status: DNR Family Communication: Updated patient.  No family at bedside.     Disposition:   Status is: Inpatient    Dispo: The patient is from: Home              Anticipated d/c is to: Home with home health therapies and palliative care following in the outpatient setting.               Anticipated d/c date is: Hopefully tomorrow.              Patient currently in acute on chronic kidney disease on gentle hydration, history of prostate cancer with mets to the bone, not stable for discharge.       Consultants:   Palliative care: Dr. Rowe Pavy March 25, 2020  Oncology Dr. [name] notified via epic of admission.  Procedures:   Renal ultrasound March 25, 2020  Chest x-ray March 24, 2020  CT head March 24, 2020    Antimicrobials:   None   Subjective: Patient laying in bed.  Denies any chest pain or shortness of breath.  Feeling better than on admission.  States oral intake is improving and stated he ate all his breakfast this morning.   Objective: Vitals:   03/26/20 1431 03/26/20 2144 03/27/20 0555 03/27/20 0600  BP:  127/72 124/67   Pulse:  80 86   Resp:  16 18   Temp:  99.4 F (37.4 C) 99.3 F (37.4 C)   TempSrc:  Oral Oral   SpO2:  99% 96%   Weight: 80.6 kg    83.7 kg    Intake/Output Summary (Last 24 hours) at 03/27/2020 1113 Last data filed at 03/27/2020 0900 Gross per 24 hour  Intake 1541.64 ml  Output 1000 ml  Net 541.64 ml   Filed Weights   03/26/20 1431 03/27/20 0600  Weight: 80.6 kg 83.7 kg    Examination:  General exam: NAD Respiratory system: Lungs clear to auscultation bilaterally.  No wheezes, no crackles, no rhonchi.  Normal respiratory effort. Cardiovascular system: RRR no murmurs rubs or gallops.  No JVD.  No lower extremity edema.  Gastrointestinal system: Abdomen is soft, nontender, nondistended, positive bowel sounds.  No rebound.  No guarding.  Central nervous system: Alert and oriented.  No focal neurological deficits.  Extremities: Symmetric 5 x 5 power. Skin:  No rashes, lesions or ulcers Psychiatry: Judgement and insight appear normal. Mood & affect appropriate.     Data Reviewed: I have personally reviewed following labs and imaging studies  CBC: Recent Labs  Lab 03/24/20 1354 03/25/20 0345 03/26/20 0316 03/27/20 0435  WBC 7.6 6.4 6.0  --   NEUTROABS 5.7 4.4  --   --   HGB 8.9* 7.7* 9.1* 7.9*  HCT 28.9* 25.0* 29.7* 26.2*  MCV 96.0 97.7 97.7  --   PLT 210 188 196  --     Basic Metabolic Panel: Recent Labs  Lab 03/24/20 1354 03/24/20 1731 03/25/20 0345 03/26/20 0316 03/27/20 0435  NA 140  --  138 140 140  K 4.2  --  4.0 4.2 4.3  CL 102  --  104 106 110  CO2 26  --  26 25 22   GLUCOSE 123*  --  103* 96 104*  BUN 66*  --  67* 54* 39*  CREATININE 4.00*  --  3.71* 3.07* 2.42*  CALCIUM 8.8*  --  7.9* 7.3* 6.8*  MG  --  2.5* 2.1 2.1  --   PHOS  --  1.4* 1.6* 2.3* 1.7*    GFR: Estimated Creatinine Clearance: 22.2 mL/min (A) (by C-G formula based on SCr of 2.42 mg/dL (H)).  Liver Function Tests: Recent Labs  Lab 03/24/20 1354 03/25/20 0345 03/26/20 0316 03/27/20 0435  AST 15 16  --   --   ALT 9 13  --   --   ALKPHOS 224* 166*  --   --   BILITOT 0.5 0.6  --   --   PROT 6.7 5.6*  --    --   ALBUMIN 2.6* 2.5* 2.6* 2.4*    CBG: No results for input(s): GLUCAP in the last 168 hours.   Recent Results (from the past 240 hour(s))  Resp Panel by RT-PCR (Flu A&B, Covid) Nasopharyngeal Swab     Status: None   Collection Time: 03/24/20  7:58 PM   Specimen: Nasopharyngeal Swab; Nasopharyngeal(NP) swabs in vial transport medium  Result Value Ref Range Status   SARS Coronavirus 2 by RT PCR NEGATIVE NEGATIVE Final    Comment: (NOTE) SARS-CoV-2 target nucleic acids are NOT DETECTED.  The SARS-CoV-2 RNA is generally detectable in upper respiratory specimens during the acute phase of infection. The lowest concentration of SARS-CoV-2 viral copies this assay can detect is 138 copies/mL. A negative result does not preclude SARS-Cov-2 infection and should not be used as the sole basis for treatment or other patient management decisions. A negative result may occur with  improper specimen collection/handling, submission of specimen other than nasopharyngeal swab, presence of viral mutation(s) within the areas targeted by this assay, and inadequate number of viral copies(<138 copies/mL). A negative result must be combined with clinical observations, patient history, and epidemiological information. The expected result is Negative.  Fact Sheet for Patients:  EntrepreneurPulse.com.au  Fact Sheet for Healthcare Providers:  IncredibleEmployment.be  This test is no t yet approved or cleared by the [place] FDA and  has been authorized for detection and/or diagnosis of SARS-CoV-2 by FDA under an Emergency Use Authorization (EUA). This EUA will remain  in effect (meaning this test can be used) for the duration of the COVID-19 declaration under Section 564(b)(1) of the Act, 21 U.S.C.section 360bbb-3(b)(1), unless the authorization is terminated  or revoked sooner.       Influenza A by PCR NEGATIVE NEGATIVE Final   Influenza B by PCR NEGATIVE  NEGATIVE  Final    Comment: (NOTE) The Xpert Xpress SARS-CoV-2/FLU/RSV plus assay is intended as an aid in the diagnosis of influenza from Nasopharyngeal swab specimens and should not be used as a sole basis for treatment. Nasal washings and aspirates are unacceptable for Xpert Xpress SARS-CoV-2/FLU/RSV testing.  Fact Sheet for Patients: EntrepreneurPulse.com.au  Fact Sheet for Healthcare Providers: IncredibleEmployment.be  This test is not yet approved or cleared by the [place] FDA and has been authorized for detection and/or diagnosis of SARS-CoV-2 by FDA under an Emergency Use Authorization (EUA). This EUA will remain in effect (meaning this test can be used) for the duration of the COVID-19 declaration under Section 564(b)(1) of the Act, 21 U.S.C. section 360bbb-3(b)(1), unless the authorization is terminated or revoked.  Performed at [facility], [place] [address], [place], [place] [address]          Radiology Studies: US RENAL  Result Date: 03/25/2020 CLINICAL DATA:  Acute renal injury. EXAM: RENAL / URINARY TRACT ULTRASOUND COMPLETE COMPARISON:  Renal ultrasound 03/04/2020 and PET-CT 03/04/2020 FINDINGS: Right Kidney: Renal measurements: 10.4 x 4.5 x 5.0 cm = volume: 124 mL. Normal renal cortical thickness and echogenicity for age. No renal lesions, hydronephrosis or renal calculi. Left Kidney: Renal measurements: 8.6 x 4.3 x 4.8 cm = volume: 94 mL. Moderate renal cortical thinning but normal echogenicity. No renal lesions or hydronephrosis. No renal calculi. Bladder: Appears normal for degree of bladder distention. Other: None. IMPRESSION: 1. Moderate renal cortical thinning involving the left kidney. 2. No worrisome renal lesions or hydronephrosis. Electronically Signed   By: Marijo Sanes M.D.   On: 03/25/2020 12:55        Scheduled Meds: . aspirin EC  81 mg Oral Daily  . feeding supplement  237 mL Oral BID  BM  . fentaNYL  1 patch Transdermal Q72H  . potassium & sodium phosphates  1 packet Oral Q8H  . predniSONE  5 mg Oral Q breakfast  . senna  1 tablet Oral BID  . tamsulosin  0.4 mg Oral BID   Continuous Infusions: . sodium chloride 50 mL/hr at 03/26/20 1742     LOS: 2 days    Time spent: 35 minutes    Irine Seal, MD [facility]   To contact the attending provider between 7A-7P or the covering provider during after hours 7P-7A, please log into the web site [email or web address] and access using universal [place] password for that web site. If you do not have the password, please call the hospital operator.  03/27/2020, 11:13 AM

## 2020-03-28 DIAGNOSIS — N401 Enlarged prostate with lower urinary tract symptoms: Secondary | ICD-10-CM | POA: Diagnosis not present

## 2020-03-28 DIAGNOSIS — I10 Essential (primary) hypertension: Secondary | ICD-10-CM | POA: Diagnosis not present

## 2020-03-28 DIAGNOSIS — N179 Acute kidney failure, unspecified: Secondary | ICD-10-CM | POA: Diagnosis not present

## 2020-03-28 DIAGNOSIS — I2510 Atherosclerotic heart disease of native coronary artery without angina pectoris: Secondary | ICD-10-CM | POA: Diagnosis not present

## 2020-03-28 LAB — RENAL FUNCTION PANEL
Albumin: 2.2 g/dL — ABNORMAL LOW (ref 3.5–5.0)
Anion gap: 8 (ref 5–15)
BUN: 31 mg/dL — ABNORMAL HIGH (ref 8–23)
CO2: 23 mmol/L (ref 22–32)
Calcium: 6.7 mg/dL — ABNORMAL LOW (ref 8.9–10.3)
Chloride: 111 mmol/L (ref 98–111)
Creatinine, Ser: 2.12 mg/dL — ABNORMAL HIGH (ref 0.61–1.24)
GFR, Estimated: 30 mL/min — ABNORMAL LOW (ref >60)
Glucose, Bld: 93 mg/dL (ref 70–99)
Phosphorus: 1.8 mg/dL — ABNORMAL LOW (ref 2.5–4.6)
Potassium: 4.4 mmol/L (ref 3.5–5.1)
Sodium: 142 mmol/L (ref 135–145)

## 2020-03-28 LAB — PREPARE RBC (CROSSMATCH)

## 2020-03-28 LAB — HEMOGLOBIN AND HEMATOCRIT, BLOOD
HCT: 25.3 % — ABNORMAL LOW (ref 39.0–52.0)
HCT: 32.4 % — ABNORMAL LOW (ref 39.0–52.0)
Hemoglobin: 7.9 g/dL — ABNORMAL LOW (ref 13.0–17.0)
Hemoglobin: 9.8 g/dL — ABNORMAL LOW (ref 13.0–17.0)

## 2020-03-28 MED ORDER — POTASSIUM & SODIUM PHOSPHATES 280-160-250 MG PO PACK
2.0000 | PACK | Freq: Three times a day (TID) | ORAL | Status: AC
  Administered 2020-03-28 – 2020-03-29 (×3): 2 via ORAL
  Filled 2020-03-28 (×3): qty 2

## 2020-03-28 MED ORDER — DIPHENHYDRAMINE HCL 25 MG PO CAPS
25.0000 mg | ORAL_CAPSULE | Freq: Once | ORAL | Status: AC
  Administered 2020-03-28: 25 mg via ORAL
  Filled 2020-03-28: qty 1

## 2020-03-28 MED ORDER — FUROSEMIDE 10 MG/ML IJ SOLN
20.0000 mg | Freq: Once | INTRAMUSCULAR | Status: AC
  Administered 2020-03-28: 20 mg via INTRAVENOUS
  Filled 2020-03-28: qty 2

## 2020-03-28 MED ORDER — SODIUM CHLORIDE 0.9% IV SOLUTION: Freq: Once | INTRAVENOUS | Status: AC

## 2020-03-28 MED ORDER — ACETAMINOPHEN 325 MG PO TABS
650.0000 mg | ORAL_TABLET | Freq: Once | ORAL | Status: AC
  Administered 2020-03-28: 650 mg via ORAL
  Filled 2020-03-28: qty 2

## 2020-03-28 NOTE — Progress Notes (Signed)
PROGRESS NOTE    Tony [name]  IWP:[id number] DOB: 08/01/1930 DOA: 03/24/2020 PCP: [name], MD    Chief Complaint  Patient presents with  . Abnormal Lab    Brief Narrative:  HPI per Dr.Doutova Tony [name] is a 86 y.o. male with medical history significant of vascular dementia, systolic CHF chronic, CKD stage IIIb, metastatic prostate cancer with mets to the bones, severe osteoarthritis on narcotics, CAD    Presented with   increased confusion and fatigue at home for the past 1 week he went into get his blood work checked because before he have had an episode of anemia His blood counts were good but his creatinine noted to go up to 4.0 from baseline around 3 He has been having trouble getting out of bed and had generalized fatigue he has been more confused repeating questions over and over again. He has chronic pain and has recently been started on fentanyl patch. He was seen by nephrology today and was told to come to emergency department He was still taking Lasix 40 mg a day for his heart failure  He have not had increased swelling or dyspnea Family having trouble with feeding him  3 weeks ago was admitted for AKI in the setting of CKD was rehydrated Noted to be anemic with hemoglobin down to 7.5 he was transfused 1 unit no evidence of active bleeding  Regarding patient's history of metastatic prostate cancer he is currently on steroids and his [name] has been held because he felt it was causing congestive heart failure.   Infectious risk factors:  Reports none   Has  been vaccinated against COVID due to get the booster   Initial COVID TEST  NEGATIVE   Assessment & Plan:   Principal Problem:   Acute renal failure superimposed on chronic kidney disease ([place]) Active Problems:   Essential hypertension   Hyperlipidemia   BPH associated with nocturia   Coronary artery disease involving native coronary artery of native heart without angina pectoris    History of stroke   Chronic kidney disease, stage 3b (HCC)   Macrocytic anemia   Prostate cancer metastatic to bone (HCC)   HFrEF (heart failure with reduced ejection fraction) (HCC)   Vascular dementia (HCC)   AKI (acute kidney injury) ([place])   Hypophosphatemia   1 acute renal failure on chronic kidney disease stage IIIb Baseline creatinine approximately 2.8.  Patient noted to have had a progressive decline in his kidney function. Acute renal failure likely secondary to a prerenal azotemia in the setting of diuretics.  Urine sodium of 39.  Urine creatinine of 118.14.  Urinalysis nitrite negative leukocytes negative, negative for protein.  Renal ultrasound negative for hydronephrosis.  Patient on gentle hydration with improvement with renal function.  Creatinine currently at 2.12 from 2.42 from 3.07 from 3.71 from 4.0 on admission.  Saline lock IV fluids.  Supportive care.  Outpatient follow-up with nephrology.    2.  Hypophosphatemia Initially improved however phosphorus currently at 1.8.  Replete.  Follow.   3.  Acute metabolic encephalopathy Questionable etiology.  Clinical improvement.  Likely secondary to problem #1 and dehydration.  At baseline.  Follow.  4.  Vascular dementia Patient with some sundowning and delirium on occasion.  Currently alert and oriented.  Stable.  Follow.  5.  Prostate cancer with metastases to the bone Oncology notified of patient's admission via epic.  Palliative care consulted for goals of care.  Per oncology.  6.  Hyperlipidemia Zetia.  7.  Normocytic anemia anemia/anemia of chronic disease Patient with no overt bleeding.  Hemoglobin currently at 7.9 from 7.9 from 9.1 from 7.7.  Anemia in the setting of metastatic prostate cancer.  Patient with no overt bleeding.  Anemia panel from January 27, 2020 with iron level of 43, TIBC of 159, ferritin of May 02, 2006, folate of 26.7.  Per RN patient's daughter stating hematology recommending that patient's  hemoglobin needs to be greater than 8 and requesting patient be transfused prior to discharge.  We will transfuse 1 unit packed red blood cells.  We will give a dose of Lasix 20 mg IV posttransfusion.  Heparin discontinued.  Follow H&H.   8.  Chronic systolic heart failure Patient noted to be more on the dry side on admission and as such diuretics were held and patient placed on gentle hydration.  Saline lock IV fluids this evening.  Continue to hold diuretics.  Follow.   9.  Hypertension Patient noted to have borderline soft blood pressures on presentation.  Blood pressure improved with hydration.  Continue to hold diuretics.  Follow.   10.  Coronary artery disease Stable.  Continue to hold diuretics due to worsening renal function on admission and borderline blood pressure.  Continue aspirin.  Supportive care.  Follow.   11.  BPH Continue Flomax.     DVT prophylaxis: SCDs>>> anemia Code Status: DNR Family Communication: Updated patient.  No family at bedside.     Disposition:   Status is: Inpatient    Dispo: The patient is from: Home              Anticipated d/c is to: Home with home health therapies and palliative care following in the outpatient setting.               Anticipated d/c date is: Hopefully tomorrow.              Patient currently in acute on chronic kidney disease on gentle hydration, history of prostate cancer with mets to the bone, to receive a unit of packed red blood cells today.  Not stable for discharge.       Consultants:   Palliative care: Dr. Rowe Pavy March 25, 2020  Oncology Dr. [name] notified via epic of admission.  Procedures:   Renal ultrasound March 25, 2020  Chest x-ray March 24, 2020  CT head March 24, 2020  Transfuse 1 unit packed red blood cells.  Antimicrobials:   None   Subjective: Patient sitting up in bed.  Denies any chest pain or shortness of breath.  Feeling better.  Per RN daughter insistent that patient is  hematology/oncologist wanting hemoglobin greater than 8.   Objective: Vitals:   03/27/20 1435 03/27/20 2047 03/28/20 0500 03/28/20 0506  BP: 127/73 122/62  120/69  Pulse: 90 87  85  Resp: 16 20  (!) 21  Temp: 98.7 F (37.1 C) 98.3 F (36.8 C)  99.2 F (37.3 C)  TempSrc:  Oral  Oral  SpO2: 99% 100%  97%  Weight:   82.6 kg     Intake/Output Summary (Last 24 hours) at 03/28/2020 1141 Last data filed at 03/28/2020 0500 Gross per 24 hour  Intake 240 ml  Output 1050 ml  Net -810 ml   Filed Weights   03/26/20 1431 03/27/20 0600 03/28/20 0500  Weight: 80.6 kg 83.7 kg 82.6 kg    Examination:  General exam: NAD Respiratory system: CTAB no wheezes, no crackles, no rhonchi.  Normal respiratory.  Cardiovascular system: Regular rate rhythm no murmurs rubs or gallops.  No JVD.  No lower extremity edema. Gastrointestinal system: Abdomen is soft, nontender, nondistended, positive bowel sounds.  No rebound.  No guarding.  Central nervous system: Alert and oriented.  No focal neurological deficits.  Extremities: Symmetric 5 x 5 power. Skin: No rashes, lesions or ulcers Psychiatry: Judgement and insight appear normal. Mood & affect appropriate.     Data Reviewed: I have personally reviewed following labs and imaging studies  CBC: Recent Labs  Lab 03/24/20 1354 03/25/20 0345 03/26/20 0316 03/27/20 0435 03/28/20 0533  WBC 7.6 6.4 6.0  --   --   NEUTROABS 5.7 4.4  --   --   --   HGB 8.9* 7.7* 9.1* 7.9* 7.9*  HCT 28.9* 25.0* 29.7* 26.2* 25.3*  MCV 96.0 97.7 97.7  --   --   PLT 210 188 196  --   --     Basic Metabolic Panel: Recent Labs  Lab 03/24/20 1354 03/24/20 1731 03/25/20 0345 03/26/20 0316 03/27/20 0435 03/28/20 0533  NA 140  --  138 140 140 142  K 4.2  --  4.0 4.2 4.3 4.4  CL 102  --  104 106 110 111  CO2 26  --  26 25 22 23   GLUCOSE 123*  --  103* 96 104* 93  BUN 66*  --  67* 54* 39* 31*  CREATININE 4.00*  --  3.71* 3.07* 2.42* 2.12*  CALCIUM 8.8*  --  7.9*  7.3* 6.8* 6.7*  MG  --  2.5* 2.1 2.1  --   --   PHOS  --  1.4* 1.6* 2.3* 1.7* 1.8*    GFR: Estimated Creatinine Clearance: 25.2 mL/min (A) (by C-G formula based on SCr of 2.12 mg/dL (H)).  Liver Function Tests: Recent Labs  Lab 03/24/20 1354 03/25/20 0345 03/26/20 0316 03/27/20 0435 03/28/20 0533  AST 15 16  --   --   --   ALT 9 13  --   --   --   ALKPHOS 224* 166*  --   --   --   BILITOT 0.5 0.6  --   --   --   PROT 6.7 5.6*  --   --   --   ALBUMIN 2.6* 2.5* 2.6* 2.4* 2.2*    CBG: No results for input(s): GLUCAP in the last 168 hours.   Recent Results (from the past 240 hour(s))  Resp Panel by RT-PCR (Flu A&B, Covid) Nasopharyngeal Swab     Status: None   Collection Time: 03/24/20  7:58 PM   Specimen: Nasopharyngeal Swab; Nasopharyngeal(NP) swabs in vial transport medium  Result Value Ref Range Status   SARS Coronavirus 2 by RT PCR NEGATIVE NEGATIVE Final    Comment: (NOTE) SARS-CoV-2 target nucleic acids are NOT DETECTED.  The SARS-CoV-2 RNA is generally detectable in upper respiratory specimens during the acute phase of infection. The lowest concentration of SARS-CoV-2 viral copies this assay can detect is 138 copies/mL. A negative result does not preclude SARS-Cov-2 infection and should not be used as the sole basis for treatment or other patient management decisions. A negative result may occur with  improper specimen collection/handling, submission of specimen other than nasopharyngeal swab, presence of viral mutation(s) within the areas targeted by this assay, and inadequate number of viral copies(<138 copies/mL). A negative result must be combined with clinical observations, patient history, and epidemiological information. The expected result is Negative.  Fact Sheet for Patients:  EntrepreneurPulse.com.au  Fact Sheet for Healthcare Providers:  IncredibleEmployment.be  This test is no t yet approved or cleared by the  [place] FDA and  has been authorized for detection and/or diagnosis of SARS-CoV-2 by FDA under an Emergency Use Authorization (EUA). This EUA will remain  in effect (meaning this test can be used) for the duration of the COVID-19 declaration under Section 564(b)(1) of the Act, 21 U.S.C.section 360bbb-3(b)(1), unless the authorization is terminated  or revoked sooner.       Influenza A by PCR NEGATIVE NEGATIVE Final   Influenza B by PCR NEGATIVE NEGATIVE Final    Comment: (NOTE) The Xpert Xpress SARS-CoV-2/FLU/RSV plus assay is intended as an aid in the diagnosis of influenza from Nasopharyngeal swab specimens and should not be used as a sole basis for treatment. Nasal washings and aspirates are unacceptable for Xpert Xpress SARS-CoV-2/FLU/RSV testing.  Fact Sheet for Patients: EntrepreneurPulse.com.au  Fact Sheet for Healthcare Providers: IncredibleEmployment.be  This test is not yet approved or cleared by the [place] FDA and has been authorized for detection and/or diagnosis of SARS-CoV-2 by FDA under an Emergency Use Authorization (EUA). This EUA will remain in effect (meaning this test can be used) for the duration of the COVID-19 declaration under Section 564(b)(1) of the Act, 21 U.S.C. section 360bbb-3(b)(1), unless the authorization is terminated or revoked.  Performed at [facility], [place] [address], [place], [place] [address]          Radiology Studies: No results found.      Scheduled Meds: . sodium chloride   Intravenous Once  . acetaminophen  650 mg Oral Once  . aspirin EC  81 mg Oral Daily  . diphenhydrAMINE  25 mg Oral Once  . feeding supplement  237 mL Oral BID BM  . fentaNYL  1 patch Transdermal Q72H  . furosemide  20 mg Intravenous Once  . potassium & sodium phosphates  2 packet Oral Q8H  . predniSONE  5 mg Oral Q breakfast  . senna  1 tablet Oral BID  . tamsulosin  0.4 mg  Oral BID   Continuous Infusions:    LOS: 3 days    Time spent: 35 minutes    Irine Seal, MD [facility]   To contact the attending provider between 7A-7P or the covering provider during after hours 7P-7A, please log into the web site [email or web address] and access using universal [place] password for that web site. If you do not have the password, please call the hospital operator.  03/28/2020, 11:41 AM

## 2020-03-28 NOTE — Progress Notes (Deleted)
Cardiology Office Note   Date:  03/28/2020   ID:  Tony [name], DOB October 03, 1930, MRN [id number]  PCP:  [name], MD  Cardiologist:  Dr. Irish Lack, MD   No chief complaint on file.  HOSPITAL    History of Present Illness: Tony [name] is a 86 y.o. male who presents for follow up, seen for Dr. Irish Lack.   Tony [name] has a hx of CAD s/p prior stenting to LAD in 2012 with occluded RCA with collaterals also noted at that time, prior stroke in 2012, hypertension, hyperlipidemia with intolerance to statins, CKD stage IV, BPH, gout, chronic anemia, and stage IV prostate cancer with metastasis to bone  He had a NSTEMI in 12/2010 that was treated at [facility] in [place]. At that time, he was found to be in complete heart block secondary to ACS. Cardiac cath showed 100% RCA occlusion with left to right collaterals and 80% LAD stenosis. He underwent successful PTCA and stenting of LAD lesion. RCA lesion was treated medically. He also reportedly had a stroke and kidney failure (requiring 1 session of dialysis) at same time of MI. He was first seen by Dr. Irish Lack in 2018 to get established with a Cardiologist in [place].   He was seen by cardiology in hospital consultation 01/27/20 for the evaluation of SOB and LE edema. Echo at that time showed new LV dysfunction with an EF 35-40% with global HK and mildly reduced RVF with moderate PHTN. He was given IV lasix with excellent response. He was noted to be on chronic steroids for his cancer which was likely causing worsening edema.   Per chart review, he was adamant above not wanting invasive workup of his new LV dysfunction and opted  for conservative measures. No stress test, coronary CT or LHC was performed. Plan was to treat medically with ASA, beta blocker, carvedilol and Bidil    He was then seen by myself 02/20/20 in follow up at which time he had c/o fatigue but was otherwise doing ok. Daughter seemed very attuned to  weighing daily and following his medications closely.  She has lots of questions regarding if he has a weight gain of 3 pounds or greater in 1 day with or without a weight gain.  We have decided to add as needed Lasix 20 mg if weight gain in 1 day.     Unfortunately it appear he was re-admitted to the hospital 11/24/21with increased confusion at which time he was sent for labs and found to have a creatinine at 4.0 from baseline around 3.0. He has been having trouble getting out of bed and had generalized fatigue he has been more confused repeating questions over and over again. He has chronic pain and has recently been started on fentanyl patch. He was seen by nephrology today and was told to come to emergency department He was still taking Lasix 40 mg a day for his heart failure  He have not had increased swelling or dyspnea Family having trouble with feeding him  3 weeks ago was admitted for AKI in the setting of CKD was rehydrated Noted to be anemic with hemoglobin down to 7.5 he was transfused 1 unit no evidence of active bleeding  Regarding patient's history of metastatic prostate cancer he is currently on steroids and his [name] has been held because he felt it was causing congestive heart failure.  Palliative medicine was consulted 03/25/20 to set goals of care and DNR status was estabilshed.  1 acute renal failure on chronic kidney disease stage IIIb Baseline creatinine approximately 2.8.  Patient noted to have had a progressive decline in his kidney function. Acute renal failure likely secondary to a prerenal azotemia in the setting of diuretics.  Urine sodium of 39.  Urine creatinine of 118.14.  Urinalysis nitrite negative leukocytes negative, negative for protein.  Renal ultrasound negative for hydronephrosis.  Patient on gentle hydration with improvement with renal function.  Creatinine currently at 2.42 from 3.07 from 3.71 from 4.0 on admission.  Saline lock IV fluids this  evening.  Supportive care.  Outpatient follow-up with nephrology.    2.  Hypophosphatemia Initially improved however phosphorus currently at 1.7.  Replete.  Follow.   3.  Acute metabolic encephalopathy Questionable etiology.  Improved clinically.  Likely secondary to problem #1 and dehydration.  Likely at baseline.  Follow.  4.  Vascular dementia Patient with some sundowning and delirium on occasion.  Currently alert and oriented.  Stable.  Follow.  5.  Prostate cancer with metastases to the bone Oncology notified of patient's admission via epic.  Palliative care consulted for goals of care.  Per oncology.  6.  Hyperlipidemia Zetia.  7.  Normocytic anemia anemia/anemia of chronic disease Patient with no overt bleeding.  Hemoglobin currently at 7.9 from 9.1 from 7.7.  Anemia in the setting of metastatic prostate cancer.  Patient with no overt bleeding.  Anemia panel from January 27, 2020 with iron level of 43, TIBC of 159, ferritin of May 02, 2006, folate of 26.7.  Transfusion threshold hemoglobin < 7-7.5.  Patient with no overt bleeding.  Discontinued heparin.  Follow.  8.  Chronic systolic heart failure Patient noted to be more on the dry side on admission and as such diuretics were held and patient placed on gentle hydration.  Saline lock IV fluids this evening.  Continue to hold diuretics.  Follow.   9.  Hypertension Patient noted to have borderline soft blood pressures on presentation.  Blood pressure improving with gentle hydration.  Continue to hold diuretics.  Follow.   10.  Coronary artery disease Stable.  Continue to hold diuretics due to worsening renal function on admission and borderline blood pressure.  Continue aspirin.  Supportive care.  Follow.   11.  BPH Flomax.        1.  New onset systolic CHF/presumed ischemic cardiomyopathy: -Echocardiogram during recent hospitalization with LVEF at 35 to 40%, down from 50% at time of prior MI with moderate  LVH and global hypokinesis.  RV also mildly enlarged with mildly reduced systolic function and mildly elevated PSAP at 42 mmHg -Patient deferred invasive work-up including stress test, CCTA or LHC and opted for medical management -Currently managed with 40 mg Lasix daily however we added 20 mg as needed dosing for weight greater than 3 pounds in 1 day and 5 pounds in 1 week -Continue BiDil, carvedilol -Continue low salt, fluid restriction  2. CAD s/p stenting to LAD in 2012/Suspected demand ischemia: -Prior NSTEMI in 2012 with stenting to LAD also noted to have RCA with collaterals now with new LV dysfunction  -Patient deferred CV workup  -Continue medical management with ASA, beta-blocker  -Intolerant to statin therapy  -Denies anginal symptoms  3. HTN: -Stable, 132/70 today -Continue current regimen with no change  4. HLD: -Intolerant to statin therapy secondary to myalgias -Last LDL, 129  -May need referral to lipid clinic   5. CKD stage IV: -Creatinine elevated around the 2.0 mark with a  baseline at 2.8 -Has nephrology referral>> awaiting callback        Past Medical History:  Diagnosis Date  . BPH associated with nocturia    flomax 0.4--> 0.8 mg trial. nocturia if has coffee. some incontinence  . CAD (coronary artery disease)    LAD Stent 2012. 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   . Gout    no rx. apparently 1x in past  . History of stroke    no aspirin before stroke. no deficits. slurred words at time of stroke  . Hyperlipidemia   . Hypertension     Past Surgical History:  Procedure Laterality Date  . CARDIAC SURGERY     Stint  . CORONARY STENT PLACEMENT    . left arm fracture s/p surgery    . right leg fracture s.p surgery- screws like arm       No current facility-administered medications for this visit.   No current outpatient medications on file.   Facility-Administered Medications Ordered in Other Visits  Medication  Dose Route Frequency Provider Last Rate Last Admin  . acetaminophen (TYLENOL) tablet 650 mg  650 mg Oral Q6H PRN Toy Baker, MD       Or  . acetaminophen (TYLENOL) suppository 650 mg  650 mg Rectal Q6H PRN Doutova, Anastassia, MD      . aspirin EC tablet 81 mg  81 mg Oral Daily Doutova, Anastassia, MD   81 mg at 03/27/20 0920  . feeding supplement (ENSURE ENLIVE / ENSURE PLUS) liquid 237 mL  237 mL Oral BID BM Eugenie Filler, MD   237 mL at 03/27/20 2302  . fentaNYL (DURAGESIC) 25 MCG/HR 1 patch  1 patch Transdermal Q72H Toy Baker, MD   1 patch at 03/27/20 2301  . HYDROcodone-acetaminophen (NORCO/VICODIN) 5-325 MG per tablet 1-2 tablet  1-2 tablet Oral Q4H PRN Doutova, Anastassia, MD      . polyethylene glycol (MIRALAX / GLYCOLAX) packet 17 g  17 g Oral Daily PRN Doutova, Anastassia, MD      . predniSONE (DELTASONE) tablet 5 mg  5 mg Oral Q breakfast Doutova, Anastassia, MD   5 mg at 03/27/20 0920  . senna (SENOKOT) tablet 8.6 mg  1 tablet Oral BID Toy Baker, MD   8.6 mg at 03/27/20 2301  . tamsulosin (FLOMAX) capsule 0.4 mg  0.4 mg Oral BID Toy Baker, MD   0.4 mg at 03/27/20 2301    Allergies:   Gabapentin    Social History:  The patient  reports that he quit smoking about 67 years ago. His smoking use included cigarettes. He has a 0.50 pack-year smoking history. He has never used smokeless tobacco. He reports that he does not drink alcohol and does not use drugs.   Family History:  The patient's ***family history includes Breast cancer in his mother; Heart disease in his brother and father; Prostate cancer in his brother.    ROS:  Please see the history of present illness.   Otherwise, review of systems are positive for {NONE DEFAULTED:18576::"none"}.   All other systems are reviewed and negative.    PHYSICAL EXAM: VS:  There were no vitals taken for this visit. , BMI There is no height or weight on file to calculate BMI. GEN: Well nourished,  well developed, in no acute distress HEENT: normal Neck: no JVD, carotid bruits, or masses Cardiac: ***RRR; no murmurs, rubs, or gallops,no edema  Respiratory:  clear to auscultation bilaterally, normal work of  breathing GI: soft, nontender, nondistended, + BS MS: no deformity or atrophy Skin: warm and dry, no rash Neuro:  Strength and sensation are intact Psych: euthymic mood, full affect   EKG:  EKG {ACTION; IS/IS AXE:94076808} ordered today. The ekg ordered today demonstrates ***   Recent Labs: 03/24/2020: B Natriuretic Peptide 722.9 03/25/2020: ALT 13; TSH 1.165 03/26/2020: Magnesium 2.1; Platelets 196 03/28/2020: BUN 31; Creatinine, Ser 2.12; Hemoglobin 7.9; Potassium 4.4; Sodium 142    Lipid Panel    Component Value Date/Time   CHOL 210 (H) 01/29/2020 0545   TRIG 149 01/29/2020 0545   HDL 51 01/29/2020 0545   CHOLHDL 4.1 01/29/2020 0545   VLDL 30 01/29/2020 0545   LDLCALC 129 (H) 01/29/2020 0545   LDLDIRECT 141.0 10/30/2016 1528      Wt Readings from Last 3 Encounters:  03/28/20 182 lb 1.6 oz (82.6 kg)  03/11/20 183 lb 3.2 oz (83.1 kg)  02/26/20 176 lb (79.8 kg)      Other studies Reviewed: Additional studies/ records that were reviewed today include: ***. Review of the above records demonstrates: ***  Echo 01/26/20 1. Left ventricular ejection fraction, by estimation, is 35 to 40%. The  left ventricle has moderately decreased function. The left ventricle  demonstrates global hypokinesis. There is moderate concentric left  ventricular hypertrophy. Left ventricular  diastolic parameters are consistent with Grade I diastolic dysfunction  (impaired relaxation).  2. Right ventricular systolic function is mildly reduced. The right  ventricular size is mildly enlarged. There is mildly elevated pulmonary  artery systolic pressure. The estimated right ventricular systolic  pressure is 81.1 mmHg.  3. The mitral valve is normal in structure. Mild mitral valve   regurgitation. No evidence of mitral stenosis.  4. Tricuspid valve regurgitation is moderate.  5. The aortic valve is normal in structure. Aortic valve regurgitation is  mild. No aortic stenosis is present.  6. The inferior vena cava is normal in size with greater than 50%  respiratory variability, suggesting right atrial pressure of 3 mmHg.    ASSESSMENT AND PLAN:  1.  ***   Current medicines are reviewed at length with the patient today.  The patient {ACTIONS; HAS/DOES NOT HAVE:19233} concerns regarding medicines.  The following changes have been made:  {PLAN; NO CHANGE:13088:s}  Labs/ tests ordered today include: *** No orders of the defined types were placed in this encounter.    Disposition:   FU with *** in {gen number 0-31:[id number]} {Days to years:10300}  Signed, Kathyrn Drown, NP  03/28/2020 7:17 AM    [facility] [address], [place], [place]  [address] Phone: [phone]; Fax: [phone]

## 2020-03-29 ENCOUNTER — Telehealth: Payer: Self-pay | Admitting: *Deleted

## 2020-03-29 ENCOUNTER — Telehealth: Payer: Self-pay

## 2020-03-29 DIAGNOSIS — Z7189 Other specified counseling: Secondary | ICD-10-CM | POA: Diagnosis not present

## 2020-03-29 DIAGNOSIS — Z515 Encounter for palliative care: Secondary | ICD-10-CM | POA: Diagnosis not present

## 2020-03-29 DIAGNOSIS — N401 Enlarged prostate with lower urinary tract symptoms: Secondary | ICD-10-CM | POA: Diagnosis not present

## 2020-03-29 DIAGNOSIS — I10 Essential (primary) hypertension: Secondary | ICD-10-CM | POA: Diagnosis not present

## 2020-03-29 DIAGNOSIS — R531 Weakness: Secondary | ICD-10-CM | POA: Diagnosis not present

## 2020-03-29 DIAGNOSIS — N179 Acute kidney failure, unspecified: Secondary | ICD-10-CM | POA: Diagnosis not present

## 2020-03-29 DIAGNOSIS — I2510 Atherosclerotic heart disease of native coronary artery without angina pectoris: Secondary | ICD-10-CM | POA: Diagnosis not present

## 2020-03-29 LAB — BPAM RBC
Blood Product Expiration Date: 202112302359
ISSUE DATE / TIME: 202111281304
Unit Type and Rh: 5100

## 2020-03-29 LAB — HEMOGLOBIN AND HEMATOCRIT, BLOOD
HCT: 29.9 % — ABNORMAL LOW (ref 39.0–52.0)
Hemoglobin: 9.1 g/dL — ABNORMAL LOW (ref 13.0–17.0)

## 2020-03-29 LAB — TYPE AND SCREEN
ABO/RH(D): O POS
Antibody Screen: NEGATIVE
Unit division: 0

## 2020-03-29 LAB — RENAL FUNCTION PANEL
Albumin: 2.5 g/dL — ABNORMAL LOW (ref 3.5–5.0)
Anion gap: 9 (ref 5–15)
BUN: 30 mg/dL — ABNORMAL HIGH (ref 8–23)
CO2: 24 mmol/L (ref 22–32)
Calcium: 6.5 mg/dL — ABNORMAL LOW (ref 8.9–10.3)
Chloride: 110 mmol/L (ref 98–111)
Creatinine, Ser: 2.34 mg/dL — ABNORMAL HIGH (ref 0.61–1.24)
GFR, Estimated: 26 mL/min — ABNORMAL LOW (ref >60)
Glucose, Bld: 112 mg/dL — ABNORMAL HIGH (ref 70–99)
Phosphorus: 2.2 mg/dL — ABNORMAL LOW (ref 2.5–4.6)
Potassium: 4.6 mmol/L (ref 3.5–5.1)
Sodium: 143 mmol/L (ref 135–145)

## 2020-03-29 MED ORDER — BIDIL 20-37.5 MG PO TABS
1.0000 | ORAL_TABLET | Freq: Two times a day (BID) | ORAL | Status: DC
Start: 2020-03-31 — End: 2020-05-13

## 2020-03-29 MED ORDER — FUROSEMIDE 40 MG PO TABS: 40.0000 mg | ORAL_TABLET | Freq: Every day | ORAL | 3 refills | Status: AC | PRN

## 2020-03-29 NOTE — Plan of Care (Signed)
  Problem: Education: Goal: Knowledge of General Education information will improve Description: Including pain rating scale, medication(s)/side effects and non-pharmacologic comfort measures 03/29/2020 1337 by Sharene Butters, RN Outcome: Adequate for Discharge 03/29/2020 1241 by Sharene Butters, RN Outcome: Progressing   Problem: Health Behavior/Discharge Planning: Goal: Ability to manage health-related needs will improve 03/29/2020 1337 by Sharene Butters, RN Outcome: Adequate for Discharge 03/29/2020 1241 by Sharene Butters, RN Outcome: Progressing   Problem: Clinical Measurements: Goal: Ability to maintain clinical measurements within normal limits will improve 03/29/2020 1337 by Sharene Butters, RN Outcome: Adequate for Discharge 03/29/2020 1241 by Sharene Butters, RN Outcome: Progressing Goal: Will remain free from infection Outcome: Adequate for Discharge Goal: Diagnostic test results will improve Outcome: Adequate for Discharge Goal: Respiratory complications will improve Outcome: Adequate for Discharge Goal: Cardiovascular complication will be avoided Outcome: Adequate for Discharge   Problem: Activity: Goal: Risk for activity intolerance will decrease Outcome: Adequate for Discharge   Problem: Nutrition: Goal: Adequate nutrition will be maintained Outcome: Adequate for Discharge   Problem: Coping: Goal: Level of anxiety will decrease Outcome: Adequate for Discharge   Problem: Elimination: Goal: Will not experience complications related to bowel motility Outcome: Adequate for Discharge Goal: Will not experience complications related to urinary retention Outcome: Adequate for Discharge   Problem: Pain Managment: Goal: General experience of comfort will improve Outcome: Adequate for Discharge   Problem: Safety: Goal: Ability to remain free from injury will improve Outcome: Adequate for Discharge   Problem: Skin Integrity: Goal: Risk for impaired skin integrity will  decrease Outcome: Adequate for Discharge   Problem: Education: Goal: Knowledge of disease and its progression will improve Outcome: Adequate for Discharge Goal: Individualized Educational Video(s) Outcome: Adequate for Discharge   Problem: Fluid Volume: Goal: Compliance with measures to maintain balanced fluid volume will improve Outcome: Adequate for Discharge   Problem: Health Behavior/Discharge Planning: Goal: Ability to manage health-related needs will improve Outcome: Adequate for Discharge   Problem: Nutritional: Goal: Ability to make healthy dietary choices will improve Outcome: Adequate for Discharge   Problem: Clinical Measurements: Goal: Complications related to the disease process, condition or treatment will be avoided or minimized Outcome: Adequate for Discharge   Problem: Education: Goal: Knowledge of disease and its progression will improve Outcome: Adequate for Discharge   Problem: Health Behavior/Discharge Planning: Goal: Ability to manage health-related needs will improve Outcome: Adequate for Discharge   Problem: Clinical Measurements: Goal: Complications related to the disease process or treatment will be avoided or minimized Outcome: Adequate for Discharge Goal: Dialysis access will remain free of complications Outcome: Adequate for Discharge   Problem: Activity: Goal: Activity intolerance will improve Outcome: Adequate for Discharge   Problem: Fluid Volume: Goal: Fluid volume balance will be maintained or improved Outcome: Adequate for Discharge   Problem: Nutritional: Goal: Ability to make appropriate dietary choices will improve Outcome: Adequate for Discharge   Problem: Respiratory: Goal: Respiratory symptoms related to disease process will be avoided Outcome: Adequate for Discharge   Problem: Self-Concept: Goal: Body image disturbance will be avoided or minimized Outcome: Adequate for Discharge   Problem: Urinary Elimination: Goal:  Progression of disease will be identified and treated Outcome: Adequate for Discharge

## 2020-03-29 NOTE — Plan of Care (Signed)
Problem: Education: Goal: Knowledge of General Education information will improve Description: Including pain rating scale, medication(s)/side effects and non-pharmacologic comfort measures 03/29/2020 1338 by Sharene Butters, RN Outcome: Adequate for Discharge 03/29/2020 1337 by Sharene Butters, RN Outcome: Adequate for Discharge 03/29/2020 1241 by Sharene Butters, RN Outcome: Progressing   Problem: Health Behavior/Discharge Planning: Goal: Ability to manage health-related needs will improve 03/29/2020 1338 by Sharene Butters, RN Outcome: Adequate for Discharge 03/29/2020 1337 by Sharene Butters, RN Outcome: Adequate for Discharge 03/29/2020 1241 by Sharene Butters, RN Outcome: Progressing   Problem: Clinical Measurements: Goal: Ability to maintain clinical measurements within normal limits will improve 03/29/2020 1338 by Sharene Butters, RN Outcome: Adequate for Discharge 03/29/2020 1337 by Sharene Butters, RN Outcome: Adequate for Discharge 03/29/2020 1241 by Sharene Butters, RN Outcome: Progressing Goal: Will remain free from infection 03/29/2020 1338 by Sharene Butters, RN Outcome: Adequate for Discharge 03/29/2020 1337 by Sharene Butters, RN Outcome: Adequate for Discharge Goal: Diagnostic test results will improve 03/29/2020 1338 by Sharene Butters, RN Outcome: Adequate for Discharge 03/29/2020 1337 by Sharene Butters, RN Outcome: Adequate for Discharge Goal: Respiratory complications will improve 03/29/2020 1338 by Sharene Butters, RN Outcome: Adequate for Discharge 03/29/2020 1337 by Sharene Butters, RN Outcome: Adequate for Discharge Goal: Cardiovascular complication will be avoided 03/29/2020 1338 by Sharene Butters, RN Outcome: Adequate for Discharge 03/29/2020 1337 by Sharene Butters, RN Outcome: Adequate for Discharge   Problem: Activity: Goal: Risk for activity intolerance will decrease 03/29/2020 1338 by Sharene Butters, RN Outcome: Adequate for Discharge 03/29/2020 1337 by Sharene Butters, RN Outcome:  Adequate for Discharge   Problem: Nutrition: Goal: Adequate nutrition will be maintained 03/29/2020 1338 by Sharene Butters, RN Outcome: Adequate for Discharge 03/29/2020 1337 by Sharene Butters, RN Outcome: Adequate for Discharge   Problem: Coping: Goal: Level of anxiety will decrease 03/29/2020 1338 by Sharene Butters, RN Outcome: Adequate for Discharge 03/29/2020 1337 by Sharene Butters, RN Outcome: Adequate for Discharge   Problem: Elimination: Goal: Will not experience complications related to bowel motility 03/29/2020 1338 by Sharene Butters, RN Outcome: Adequate for Discharge 03/29/2020 1337 by Sharene Butters, RN Outcome: Adequate for Discharge Goal: Will not experience complications related to urinary retention 03/29/2020 1338 by Sharene Butters, RN Outcome: Adequate for Discharge 03/29/2020 1337 by Sharene Butters, RN Outcome: Adequate for Discharge   Problem: Pain Managment: Goal: General experience of comfort will improve 03/29/2020 1338 by Sharene Butters, RN Outcome: Adequate for Discharge 03/29/2020 1337 by Sharene Butters, RN Outcome: Adequate for Discharge   Problem: Safety: Goal: Ability to remain free from injury will improve 03/29/2020 1338 by Sharene Butters, RN Outcome: Adequate for Discharge 03/29/2020 1337 by Sharene Butters, RN Outcome: Adequate for Discharge   Problem: Skin Integrity: Goal: Risk for impaired skin integrity will decrease 03/29/2020 1338 by Sharene Butters, RN Outcome: Adequate for Discharge 03/29/2020 1337 by Sharene Butters, RN Outcome: Adequate for Discharge   Problem: Education: Goal: Knowledge of disease and its progression will improve 03/29/2020 1338 by Sharene Butters, RN Outcome: Adequate for Discharge 03/29/2020 1337 by Sharene Butters, RN Outcome: Adequate for Discharge Goal: Individualized Educational Video(s) 03/29/2020 1338 by Sharene Butters, RN Outcome: Adequate for Discharge 03/29/2020 1337 by Sharene Butters, RN Outcome: Adequate for Discharge    Problem: Fluid Volume: Goal: Compliance with measures to maintain balanced fluid volume will improve 03/29/2020 1338 by Sharene Butters, RN Outcome: Adequate for Discharge 03/29/2020 1337 by Sharene Butters, RN Outcome: Adequate for Discharge   Problem: Health Behavior/Discharge Planning: Goal: Ability to manage health-related needs will improve 03/29/2020 1338  by Sharene Butters, RN Outcome: Adequate for Discharge 03/29/2020 1337 by Sharene Butters, RN Outcome: Adequate for Discharge   Problem: Nutritional: Goal: Ability to make healthy dietary choices will improve 03/29/2020 1338 by Sharene Butters, RN Outcome: Adequate for Discharge 03/29/2020 1337 by Sharene Butters, RN Outcome: Adequate for Discharge   Problem: Clinical Measurements: Goal: Complications related to the disease process, condition or treatment will be avoided or minimized 03/29/2020 1338 by Sharene Butters, RN Outcome: Adequate for Discharge 03/29/2020 1337 by Sharene Butters, RN Outcome: Adequate for Discharge   Problem: Education: Goal: Knowledge of disease and its progression will improve 03/29/2020 1338 by Sharene Butters, RN Outcome: Adequate for Discharge 03/29/2020 1337 by Sharene Butters, RN Outcome: Adequate for Discharge   Problem: Health Behavior/Discharge Planning: Goal: Ability to manage health-related needs will improve 03/29/2020 1338 by Sharene Butters, RN Outcome: Adequate for Discharge 03/29/2020 1337 by Sharene Butters, RN Outcome: Adequate for Discharge   Problem: Clinical Measurements: Goal: Complications related to the disease process or treatment will be avoided or minimized 03/29/2020 1338 by Sharene Butters, RN Outcome: Adequate for Discharge 03/29/2020 1337 by Sharene Butters, RN Outcome: Adequate for Discharge Goal: Dialysis access will remain free of complications 21/19/4174 1338 by Sharene Butters, RN Outcome: Adequate for Discharge 03/29/2020 1337 by Sharene Butters, RN Outcome: Adequate for Discharge    Problem: Activity: Goal: Activity intolerance will improve 03/29/2020 1338 by Sharene Butters, RN Outcome: Adequate for Discharge 03/29/2020 1337 by Sharene Butters, RN Outcome: Adequate for Discharge   Problem: Fluid Volume: Goal: Fluid volume balance will be maintained or improved 03/29/2020 1338 by Sharene Butters, RN Outcome: Adequate for Discharge 03/29/2020 1337 by Sharene Butters, RN Outcome: Adequate for Discharge   Problem: Nutritional: Goal: Ability to make appropriate dietary choices will improve 03/29/2020 1338 by Sharene Butters, RN Outcome: Adequate for Discharge 03/29/2020 1337 by Sharene Butters, RN Outcome: Adequate for Discharge   Problem: Respiratory: Goal: Respiratory symptoms related to disease process will be avoided 03/29/2020 1338 by Sharene Butters, RN Outcome: Adequate for Discharge 03/29/2020 1337 by Sharene Butters, RN Outcome: Adequate for Discharge   Problem: Self-Concept: Goal: Body image disturbance will be avoided or minimized 03/29/2020 1338 by Sharene Butters, RN Outcome: Adequate for Discharge 03/29/2020 1337 by Sharene Butters, RN Outcome: Adequate for Discharge   Problem: Urinary Elimination: Goal: Progression of disease will be identified and treated 03/29/2020 1338 by Sharene Butters, RN Outcome: Adequate for Discharge 03/29/2020 1337 by Sharene Butters, RN Outcome: Adequate for Discharge

## 2020-03-29 NOTE — Plan of Care (Signed)

## 2020-03-29 NOTE — Telephone Encounter (Signed)
Junious Dresser, can you check into this?

## 2020-03-29 NOTE — Progress Notes (Signed)
Daily Progress Note   Patient Name: Tony [name]       Date: 03/29/2020 DOB: 18-Mar-1931  Age: 86 y.o. MRN#: [id number] Attending Physician: Eugenie Filler, MD Primary Care Physician: [name], MD Admit Date: 03/24/2020  Reason for Consultation/Follow-up: Establishing goals of care  Subjective:  seen briefly, no distress, got PRBC yesterday.    Length of Stay: 4  Current Medications: Scheduled Meds:   aspirin EC  81 mg Oral Daily   feeding supplement  237 mL Oral BID BM   fentaNYL  1 patch Transdermal Q72H   predniSONE  5 mg Oral Q breakfast   senna  1 tablet Oral BID   tamsulosin  0.4 mg Oral BID    Continuous Infusions:   PRN Meds: acetaminophen **OR** acetaminophen, HYDROcodone-acetaminophen, polyethylene glycol  Physical Exam         Awake alert No distress Sitting upright by the side of the bed.  Has some edema S 1 S 2  Regular work of breathing Abdomen not distended Non focal.   Vital Signs: BP 112/64    Pulse 83    Temp 98.9 F (37.2 C) (Oral)    Resp 18    Wt 82.6 kg    SpO2 99%    BMI 28.52 kg/m  SpO2: SpO2: 99 % O2 Device: O2 Device: Room Air O2 Flow Rate:    Intake/output summary:   Intake/Output Summary (Last 24 hours) at 03/29/2020 1042 Last data filed at 03/28/2020 2027 Gross per 24 hour  Intake 905 ml  Output 125 ml  Net 780 ml   LBM: Last BM Date: 03/28/20 Baseline Weight: Weight: 80.6 kg Most recent weight: Weight: 82.6 kg       Palliative Assessment/Data:      Patient Active Problem List   Diagnosis Date Noted   Acute renal failure superimposed on chronic kidney disease ([place])    Hypophosphatemia    AKI (acute kidney injury) ([place]) 03/24/2020   Vascular dementia ([place]) 03/18/2020   DNR (do not resuscitate)     HFrEF (heart failure with reduced ejection fraction) ([place]) 01/26/2020   Osteoarthritis of knee 07/16/2019   Prostate cancer metastatic to bone ([place]) 08/07/2017   Macrocytic anemia 07/20/2017   Thrombocytopenia ([place]) 07/20/2017   History of complete AV block 10/30/2016   Chronic kidney  disease, stage 3b ([place]) 10/30/2016   Essential hypertension    Hyperlipidemia    BPH associated with nocturia    Coronary artery disease involving native coronary artery of native heart without angina pectoris    History of stroke    Gout     Palliative Care Assessment & Plan   Patient Profile:    Assessment:  vascular dementia systolic CHF CKD III metastatic prostate cancer to bone, also has declining functional status recently.   Recommendations/Plan:   continue current pain and non pain symptom management.   Monitor hospital course, participation with PT OT and PO intake.   Call placed and discussed with daughter [name], we reviewed differences between patient's current home health care, home based palliative and home based hospice care, will request [facility] consult for further assistance, she is also seeking further input from med onc regarding the patient's overall disease condition and estimated prognosis.      Code Status:    Code Status Orders  (From admission, onward)         Start     Ordered   03/24/20 2154  Do not attempt resuscitation (DNR)  Continuous       Question Answer Comment  In the event of cardiac or respiratory ARREST Do not call a code blue   In the event of cardiac or respiratory ARREST Do not perform Intubation, CPR, defibrillation or ACLS   In the event of cardiac or respiratory ARREST Use medication by any route, position, wound care, and other measures to relive pain and suffering. May use oxygen, suction and manual treatment of airway obstruction as needed for comfort.      03/24/20 2153        Code Status History    Date Active Date  Inactive Code Status Order ID Comments User Context   03/04/2020 1905 03/12/2020 1752 DNR [id number]  Bonnielee Haff, MD ED   01/30/2020 1020 02/01/2020 0127 DNR [id number]  Basilio Cairo, NP Inpatient   01/26/2020 0142 01/30/2020 1019 Full Code [id number]  Vernelle Emerald, MD ED   07/20/2017 2021 07/22/2017 2144 Full Code [id number]  Wouk, Ailene Rud, MD Inpatient   Advance Care Planning Activity    Advance Directive Documentation     Most Recent Value  Type of Advance Directive Healthcare Power of Attorney, Living will, Out of facility DNR (pink MOST or yellow form)  Pre-existing out of facility DNR order (yellow form or pink MOST form) --  "MOST" Form in Place? --       Prognosis:   guarded  Discharge Planning:  Likely for home with home health and home based palliative.   Care plan was discussed with  Patient's daughter [name] on the phone as well as with [facility] MD.   Thank you for allowing the Palliative Medicine Team to assist in the care of this patient.   Time In: 10 Time Out: 10.25 Total Time 25 Prolonged Time Billed  no       Greater than 50%  of this time was spent counseling and coordinating care related to the above assessment and plan.  Loistine Chance, MD  Please contact Palliative Medicine Team phone at [phone] for questions and concerns.

## 2020-03-29 NOTE — Telephone Encounter (Signed)
Daughter called - Father received blood yesterday and may be discharged today. He has appt in office tomorrow.  Could Dr.Kale see him in hospital so that he doesn't have to come in? Daughter reports that Hospitalist/SW referring to Palliative Care and wants to know if plans have been made for further treatment or will he transition to Hospice?  If referral will be to Hospice, it may be easier to refer while still inpatient.  Daughter states with medications changed, fluids and blood, patient is more cognizant of events and less delusional than on admission, but she states he will decline once discharged and those daily assessments and treatments end. Dr. [name] informed. Dr. [name] states he will see patient in hospital today if possible. If not, will call patient during appt time tomorrow.

## 2020-03-29 NOTE — Telephone Encounter (Signed)
Pt's daughter states that a status change form was supposed to have been sent to [facility], so that pt can get more home health hours. All of pt.'s new diagnosis were supposed to be added and [facility] does not have them, especially the Dementia.   The fax is  [phone]

## 2020-03-29 NOTE — TOC Progression Note (Signed)
Transition of Care [facility]) - Progression Note    Patient Details  Name: Tony [name] MRN: [id number] Date of Birth: 24-Jan-1931  [facility] CM/SW Contact  Joaquin Courts, RN Phone Number: 03/29/2020, 2:19 PM  Clinical Narrative:    CM spoke with patient's daughter, [name], regarding dc planning.  [name] reports that patient lives with her and was receiving HHPT/OT/RN services from [facility].  Daughter asked questions re: palliative vs home hospice services.  CM provided asnwers and [name] at this time Forensic psychologist for palliative care services at home and wishes to continue [facility] services with [facility].  MD please place resumption of care orders for HHPT/OT/RN.  [facility] rep given referral for home palliative services.    Expected Discharge Plan: [place] Barriers to Discharge: Continued Medical Work up  Expected Discharge Plan and Services Expected Discharge Plan: [employer] Choice: Resumption of Svcs/PTA Provider (outpatient palliative services) Living arrangements for the past 2 months: Single Family Home Expected Discharge Date: 03/29/20                                     Social Determinants of Health (SDOH) Interventions    Readmission Risk Interventions No flowsheet data found.

## 2020-03-29 NOTE — Discharge Summary (Signed)
Physician Discharge Summary  Tony [name] HDQ:[id number] DOB: 09/29/1930 DOA: 03/24/2020  PCP: [name], MD  Admit date: 03/24/2020 Discharge date: 03/29/2020  Time spent: 60 minutes  Recommendations for Outpatient Follow-up:  1. Follow-up with Dr. [name], hematology/oncology as previously scheduled. 2. Follow-up with Dr. Joelyn Oms, nephrology in 1 week.  On follow-up patient will need a renal panel done to follow-up on electrolytes and renal function.  Patient's Lasix was changed to as needed on discharge and will need to be readdressed on follow-up. 3. Follow-up with [name], MD in 1 to 2 weeks. 4. Patient will need follow-up with palliative care in the outpatient setting.   Discharge Diagnoses:  Principal Problem:   Acute renal failure superimposed on chronic kidney disease ([place]) Active Problems:   Essential hypertension   Hyperlipidemia   BPH associated with nocturia   Coronary artery disease involving native coronary artery of native heart without angina pectoris   History of stroke   Chronic kidney disease, stage 3b (HCC)   Macrocytic anemia   Prostate cancer metastatic to bone (HCC)   HFrEF (heart failure with reduced ejection fraction) ([place])   Vascular dementia ([place])   AKI (acute kidney injury) ([place])   Hypophosphatemia   Discharge Condition: Stable and improved  Diet recommendation: Heart healthy  Filed Weights   03/26/20 1431 03/27/20 0600 03/28/20 0500  Weight: 80.6 kg 83.7 kg 82.6 kg    History of present illness:  HPI per  Dr. Lynnette Caffey Tony [name] is a 86 y.o. male with medical history significant of vascular dementia, systolic CHF chronic, CKD stage IIIb, metastatic prostate cancer with mets to the bones, severe osteoarthritis on narcotics, CAD    Presented with increased confusion and fatigue at home for the past 1 week he went into get his blood work checked because before he have had an episode of anemia His blood counts were good but  his creatinine noted to go up to 4.0 from baseline around 3 He has been having trouble getting out of bed and had generalized fatigue he has been more confused repeating questions over and over again. He has chronic pain and has recently been started on fentanyl patch. He was seen by nephrology today and was told to come to emergency department He was still taking Lasix 40 mg a day for his heart failure  He have not had increased swelling or dyspnea Family having trouble with feeding him  3 weeks ago was admitted for AKI in the setting of CKD was rehydrated Noted to be anemic with hemoglobin down to 7.5 he was transfused 1 unit no evidence of active bleeding  Regarding patient's history of metastatic prostate cancer he is currently on steroids and his [name] has been held because he felt it was causing congestive heart failure.   Infectious risk factors:  Reports none   Has  been vaccinated against COVID due to get the booster   Initial COVID TEST  NEGATIVE   Hospital Course:  1 acute renal failure on chronic kidney disease stage IIIb Baseline creatinine approximately 2.8.  Patient noted to have had a progressive decline in his kidney function. Acute renal failure likely secondary to a prerenal azotemia in the setting of diuretics.  Urine sodium of 39.  Urine creatinine of 118.14.  Urinalysis nitrite negative leukocytes negative, negative for protein.  Renal ultrasound negative for hydronephrosis.  Patient was placed on gentle hydration and patient's diuretics and antihypertensive medications held during the hospitalization.  Renal function  improved and creatinine was down to 2.34 by day of discharge.  Patient's BiDil and Coreg will be resumed gradually on discharge.  Patient's diuretics have been changed to as needed.  Outpatient follow-up with nephrology, Dr. Joelyn Oms 1 week post discharge.   2.  Hypophosphatemia Repleted during the hospitalization.  Outpatient follow-up with  nephrology.    3.  Acute metabolic encephalopathy Questionable etiology.  Likely secondary to problem #1 and dehydration.    Patient hydrated gently with IV fluids.  Metabolic encephalopathy improved and had resolved by day of discharge.   4.  Vascular dementia Patient with some sundowning and delirium on occasion.  Alert and oriented.  Outpatient follow-up.  5.  Prostate cancer with metastases to the bone Oncology notified of patient's admission via epic.  Palliative care consulted for goals of care.  Outpatient follow-up with oncology as previously scheduled.  Palliative care followed patient during the hospitalization.  Patient will need outpatient palliative care to follow in the community.  6.  Hyperlipidemia Patient maintained on home regimen Zetia.  7.  Normocytic anemia anemia/anemia of chronic disease Patient with no overt bleeding.  Hemoglobin was at 7.9 earlier on during the hospitalization.   Anemia in the setting of metastatic prostate cancer.  Patient with no overt bleeding.  Anemia panel from January 27, 2020 with iron level of 43, TIBC of 159, ferritin of May 02, 2006, folate of 26.7.  Per RN patient's daughter stated hematology recommending that patient's hemoglobin needs to be greater than 8 and requesting patient be transfused prior to discharge.  Patient transfused 1 unit packed red blood with hemoglobin stabilizing at 9.1 by day of discharge.  Outpatient follow-up with hematology/oncology.   8.  Chronic systolic heart failure Patient noted to be more on the dry side on admission and as such diuretics were held and patient placed on gentle hydration.  Blood pressure improved.  BiDil and Coreg were held and will be resumed on discharge.  Patient's Lasix will be changed to as needed on discharge.  Outpatient follow-up with PCP.   9.  Hypertension Patient noted to have borderline soft blood pressures on presentation.    Patient's diuretics, Coreg, BiDil were held and  patient hydrated gently with IV fluids.  Blood pressure improved.  Diuretics were changed to as needed on discharge.  Patient's Coreg and BiDil will be resumed gradually on discharge.  Outpatient follow-up with PCP and nephrology.   10.  Coronary artery disease Stable.    Patient's diuretics were held during the hospitalization due to worsening renal function on admission as well as borderline blood pressure.  Beta-blocker and BiDil were also held and will be resumed on discharge.  Patient maintained on aspirin.  Outpatient follow-up with PCP.    11.  BPH Patient maintained on home regimen of Flomax.  Outpatient follow-up.     Procedures:  Renal ultrasound March 25, 2020  Chest x-ray March 24, 2020  CT head March 24, 2020  Transfuse 1 unit packed red blood cells.  Consultations:  Palliative care: Dr. Rowe Pavy March 25, 2020  Oncology Dr. [name] notified via epic of admission.  Discharge Exam: Vitals:   03/28/20 2106 03/29/20 0548  BP: (!) 115/58 112/64  Pulse: 82 83  Resp: 16 18  Temp: 98.4 F (36.9 C) 98.9 F (37.2 C)  SpO2: 100% 99%    General: NAD Cardiovascular: RRR Respiratory: CTAB  Discharge Instructions   Discharge Instructions    Diet - low sodium heart healthy   Complete  by: As directed    Increase activity slowly   Complete by: As directed      Allergies as of 03/29/2020      Reactions   Gabapentin Other (See Comments)   Patient states caused hallucinations       Medication List    STOP taking these medications   senna-docusate 8.6-50 MG tablet Commonly known as: Senna S     TAKE these medications   aspirin EC 81 MG tablet Take 1 tablet (81 mg total) by mouth daily.   BiDil 20-37.5 MG tablet Generic drug: isosorbide-hydrALAZINE Take 1 tablet by mouth in the morning and at bedtime. Start taking on: March 31, 2020 What changed:   when to take this  These instructions start on March 31, 2020. If you are unsure what to  do until then, ask your doctor or other care provider.   calcium-vitamin D 500-200 MG-UNIT tablet Commonly known as: [place] 500/200 D-3 Take 1 tablet by mouth daily with breakfast.   carvedilol 12.5 MG tablet Commonly known as: COREG Take 1 tablet (12.5 mg total) by mouth 2 (two) times daily with a meal.   docusate sodium 100 MG capsule Commonly known as: COLACE Take 100 mg by mouth daily.   fentaNYL 25 MCG/HR Commonly known as: [place] 1 patch onto the skin every 3 (three) days.   furosemide 40 MG tablet Commonly known as: LASIX Take 1 tablet (40 mg total) by mouth daily as needed for fluid or edema. Take 1 tablet 40mg  as needed for swelling or weight gain of 3 pounds or more in a day or 5 pounds or more in a week. What changed:   when to take this  reasons to take this  additional instructions   MULTI COMPLETE PO Take 1 tablet by mouth daily.   Nepro Liqd Take 237 mLs by mouth in the morning, at noon, and at bedtime.   nitroGLYCERIN 0.4 MG SL tablet Commonly known as: NITROSTAT Place 1 tablet (0.4 mg total) under the tongue every 5 (five) minutes as needed for chest pain (3 maximum before seeking care). What changed: reasons to take this   Omega-3 1000 MG Caps Take 1 capsule by mouth daily.   polyethylene glycol 17 g packet Commonly known as: MiraLax Take 17 g by mouth daily. What changed:   when to take this  reasons to take this   predniSONE 5 MG tablet Commonly known as: DELTASONE Take 1 tablet (5 mg total) by mouth daily with breakfast.   tamsulosin 0.4 MG Caps capsule Commonly known as: FLOMAX TAKE 1 CAPSULE BY MOUTH TWICE DAILY What changed: when to take this   vitamin B-12 1000 MCG tablet Commonly known as: CYANOCOBALAMIN Take 1 tablet (1,000 mcg total) by mouth daily.   Vitamin D (Ergocalciferol) 1.25 MG (50000 UNIT) Caps capsule Commonly known as: DRISDOL Take 50,000 Units by mouth See admin instructions. Takes 3 times a week  ,Monday, Wednesday and Friday      Allergies  Allergen Reactions  . Gabapentin Other (See Comments)    Patient states caused hallucinations     Follow-up Information    Brunetta Genera, MD Follow up.   Specialties: Hematology, Oncology Why: Follow-up as scheduled. Contact information: [place] [address] [phone]        Rexene Agent, MD. Schedule an appointment as soon as possible for a visit in 1 week(s).   Specialty: Nephrology Contact information: [address] [place] [address]  [phone]        [name], MD. Schedule an appointment as soon as possible for a visit in 2 week(s).   Specialty: Family Medicine Why: Follow-up in 1 to 2 weeks. Contact information: [place] [address] [phone]        [name], MD .   Specialties: Cardiology, Radiology, Interventional Cardiology Contact information: [address] [place] [address] [phone]        Palliative care Follow up.                The results of significant diagnostics from this hospitalization (including imaging, microbiology, ancillary and laboratory) are listed below for reference.    Significant Diagnostic Studies: DG Chest 2 View  Result Date: 03/24/2020 CLINICAL DATA:  Increased fatigue and confusion with history of metastatic prostate cancer. EXAM: CHEST - 2 VIEW COMPARISON:  January 25, 2020 FINDINGS: The cardiac silhouette is moderately enlarged and unchanged in size. Both lungs are clear. A radiopaque surgical rod is seen throughout the left humerus. Sclerotic areas are again seen involving the osseous skeleton. IMPRESSION: 1. Stable cardiomegaly without active cardiopulmonary disease. 2. Findings consistent with known history of osseous metastasis. Electronically Signed   By: Virgina Norfolk M.D.   On: 03/24/2020 18:11   CT Head Wo Contrast  Result Date:  03/24/2020 CLINICAL DATA:  Mental status change of unknown cause. Increased fatigue and confusion EXAM: CT HEAD WITHOUT CONTRAST TECHNIQUE: Contiguous axial images were obtained from the base of the skull through the vertex without intravenous contrast. COMPARISON:  MRI of the brain from March 08, 2020 FINDINGS: Brain: No evidence of acute infarction, hemorrhage, hydrocephalus, extra-axial collection or mass lesion/mass effect. Signs of chronic microvascular ischemic change and atrophy as seen on recent MRI evaluation. Vascular: No hyperdense vessel or unexpected calcification. Skull: Normal. Negative for fracture or focal lesion. Sinuses/Orbits: Visualized paranasal sinuses and orbits are unremarkable. Other: None IMPRESSION: 1. No acute intracranial pathology. 2. Signs of chronic microvascular ischemic change and atrophy as seen on recent MRI evaluation. Electronically Signed   By: Zetta Bills M.D.   On: 03/24/2020 19:20   MR BRAIN WO CONTRAST  Result Date: 03/08/2020 CLINICAL DATA:  Neuro deficit, acute, stroke suspected EXAM: MRI HEAD WITHOUT CONTRAST TECHNIQUE: Multiplanar, multiecho pulse sequences of the brain and surrounding structures were obtained without intravenous contrast. COMPARISON:  None. FINDINGS: Brain: No diffusion-weighted signal abnormality. Small foci of SWI signal dropout involving the medial left occipital lobe and left pons. No midline shift, ventriculomegaly or extra-axial fluid collection. No mass lesion. Mild cerebral atrophy with ex vacuo dilatation. Advanced chronic microvascular ischemic changes. Chronic lacunar insults involving the corona radiata, left basal ganglia, right thalamus and bilateral cerebellum. Vascular: Normal flow voids. Skull and upper cervical spine: Calvarial heterogeneity. Sinuses/Orbits: Normal orbits. Mild ethmoid sinus mucosal thickening. Clear mastoid air cells. Other: 9 mm left masticator space FLAIR hyperintensity (9:12) with correlative T1  hypointense signal (14:41). IMPRESSION: No acute intracranial process. Advanced chronic microvascular ischemic changes and multifocal lacunar insults as detailed above. Nonspecific susceptibility artifact involving the medial left occipital lobe and left pons. Postcontrast MRI is recommended for further evaluation if patient can tolerate. Subcentimeter left masticator space FLAIR hyperintensity may reflect prominence of the pterygoid venous plexus however postcontrast MRI is also recommended to exclude metastatic lesion. Calvarial heterogeneity can also be better evaluated on postcontrast imaging. These results will be called to the ordering clinician or representative by the Radiologist Assistant, and  communication documented in the PACS or [employer]. Electronically Signed   By: Primitivo Gauze M.D.   On: 03/08/2020 14:58   US RENAL  Result Date: 03/25/2020 CLINICAL DATA:  Acute renal injury. EXAM: RENAL / URINARY TRACT ULTRASOUND COMPLETE COMPARISON:  Renal ultrasound 03/04/2020 and PET-CT 03/04/2020 FINDINGS: Right Kidney: Renal measurements: 10.4 x 4.5 x 5.0 cm = volume: 124 mL. Normal renal cortical thickness and echogenicity for age. No renal lesions, hydronephrosis or renal calculi. Left Kidney: Renal measurements: 8.6 x 4.3 x 4.8 cm = volume: 94 mL. Moderate renal cortical thinning but normal echogenicity. No renal lesions or hydronephrosis. No renal calculi. Bladder: Appears normal for degree of bladder distention. Other: None. IMPRESSION: 1. Moderate renal cortical thinning involving the left kidney. 2. No worrisome renal lesions or hydronephrosis. Electronically Signed   By: Marijo Sanes M.D.   On: 03/25/2020 12:55   US Renal  Result Date: 03/04/2020 CLINICAL DATA:  Renal failure EXAM: RENAL / URINARY TRACT ULTRASOUND COMPLETE COMPARISON:  Head CT 03/04/2020 FINDINGS: Right Kidney: Renal measurements: 9.6 x 4.2 x 4.4 cm = volume: 92 mL. Echogenicity is increased. No mass or  hydronephrosis visualized. Left Kidney: Renal measurements: 8.8 x 4.3 x 3.9 cm = volume: 77 mL. Echogenicity is increased. No mass or hydronephrosis visualized. Bladder: Appears normal for degree of bladder distention. Other: None. IMPRESSION: Increased echogenicity of the renal parenchyma suggestive of renal disease. Electronically Signed   By: Iven Finn M.D.   On: 03/04/2020 18:16   NM PET (AXUMIN) SKULL BASE TO MID THIGH  Result Date: 03/04/2020 CLINICAL DATA:  Prostate carcinoma with biochemical recurrence. Increasing PSA. Prostate cancer with bone metastasis. Hormonal therapy. PSA equal 46 EXAM: NUCLEAR MEDICINE PET SKULL BASE TO THIGH TECHNIQUE: 8.6 mCi F-18 Fluciclovine was injected intravenously. Full-ring PET imaging was performed from the skull base to thigh after the radiotracer. CT data was obtained and used for attenuation correction and anatomic localization. COMPARISON:  Fluciclovine PET-CT scan 10/16/2019 FINDINGS: NECK No radiotracer activity in neck lymph nodes. Incidental CT finding: None CHEST No radiotracer accumulation within mediastinal or hilar lymph nodes. No suspicious pulmonary nodules on the CT scan. Incidental CT finding: None ABDOMEN/PELVIS Prostate: Diffuse activity in the prostate gland similar comparison exam with SUV max equal 4.4 compared SUV max equal 4.7. Lymph nodes: No abnormal radiotracer accumulation within pelvic or abdominal nodes. Liver: No evidence of liver metastasis Incidental CT finding: Sigmoid diverticulosis. SKELETON Again demonstrated diffuse sclerotic skeletal metastasis throughout the axillary and appendicular skeleton. Internal fixation of the RIGHT femur. On the background of diffuse sclerotic skeletal metastasis, there are several foci of discrete radiotracer activity not changed from prior. For example lesion in the intertrochanteric region of the proximal left femur SUV max equal 5.5 compared SUV max equal 5.7. Lesion in the mid thoracic spine with  SUV max equal 3.7 compared SUV max equal 3.9. No evidence of progressive skeletal metastasis by Fluciclovine PET imaging. IMPRESSION: 1. No evidence prostate cancer progression by the Fluciclovine PET CT imaging. Consider Pylarify (F18 PSMA) PET imaging (available at [facility]) for potential increased specificity/sensitivity. 2. Widespread diffuse sclerotic skeletal metastasis the majority of which is not radiotracer avid. Several foci discrete radiotracer avidity within the spine and pelvis are similar to comparison exam 3. No evidence of nodal metastasis or visceral metastasis. Electronically Signed   By: Suzy Bouchard M.D.   On: 03/04/2020 16:22    Microbiology: Recent Results (from the past 240 hour(s))  Resp Panel by RT-PCR (Flu  A&B, Covid) Nasopharyngeal Swab     Status: None   Collection Time: 03/24/20  7:58 PM   Specimen: Nasopharyngeal Swab; Nasopharyngeal(NP) swabs in vial transport medium  Result Value Ref Range Status   SARS Coronavirus 2 by RT PCR NEGATIVE NEGATIVE Final    Comment: (NOTE) SARS-CoV-2 target nucleic acids are NOT DETECTED.  The SARS-CoV-2 RNA is generally detectable in upper respiratory specimens during the acute phase of infection. The lowest concentration of SARS-CoV-2 viral copies this assay can detect is 138 copies/mL. A negative result does not preclude SARS-Cov-2 infection and should not be used as the sole basis for treatment or other patient management decisions. A negative result may occur with  improper specimen collection/handling, submission of specimen other than nasopharyngeal swab, presence of viral mutation(s) within the areas targeted by this assay, and inadequate number of viral copies(<138 copies/mL). A negative result must be combined with clinical observations, patient history, and epidemiological information. The expected result is Negative.  Fact Sheet for Patients:  EntrepreneurPulse.com.au  Fact Sheet for  Healthcare Providers:  IncredibleEmployment.be  This test is no t yet approved or cleared by the [place] FDA and  has been authorized for detection and/or diagnosis of SARS-CoV-2 by FDA under an Emergency Use Authorization (EUA). This EUA will remain  in effect (meaning this test can be used) for the duration of the COVID-19 declaration under Section 564(b)(1) of the Act, 21 U.S.C.section 360bbb-3(b)(1), unless the authorization is terminated  or revoked sooner.       Influenza A by PCR NEGATIVE NEGATIVE Final   Influenza B by PCR NEGATIVE NEGATIVE Final    Comment: (NOTE) The Xpert Xpress SARS-CoV-2/FLU/RSV plus assay is intended as an aid in the diagnosis of influenza from Nasopharyngeal swab specimens and should not be used as a sole basis for treatment. Nasal washings and aspirates are unacceptable for Xpert Xpress SARS-CoV-2/FLU/RSV testing.  Fact Sheet for Patients: EntrepreneurPulse.com.au  Fact Sheet for Healthcare Providers: IncredibleEmployment.be  This test is not yet approved or cleared by the [place] FDA and has been authorized for detection and/or diagnosis of SARS-CoV-2 by FDA under an Emergency Use Authorization (EUA). This EUA will remain in effect (meaning this test can be used) for the duration of the COVID-19 declaration under Section 564(b)(1) of the Act, 21 U.S.C. section 360bbb-3(b)(1), unless the authorization is terminated or revoked.  Performed at [facility], [place] [address], [place], [place] [address]      Labs: Basic Metabolic Panel: Recent Labs  Lab 03/24/20 1354 03/24/20 1731 03/25/20 0345 03/26/20 0316 03/27/20 0435 03/28/20 0533 03/29/20 0334  NA   < >  --  138 140 140 142 143  K   < >  --  4.0 4.2 4.3 4.4 4.6  CL   < >  --  104 106 110 111 110  CO2   < >  --  26 25 22 23 24   GLUCOSE   < >  --  103* 96 104* 93 112*  BUN   < >  --  67* 54* 39*  31* 30*  CREATININE  --   --  3.71* 3.07* 2.42* 2.12* 2.34*  CALCIUM   < >  --  7.9* 7.3* 6.8* 6.7* 6.5*  MG  --  2.5* 2.1 2.1  --   --   --   PHOS   < > 1.4* 1.6* 2.3* 1.7* 1.8* 2.2*   < > = values in this interval not displayed.  Liver Function Tests: Recent Labs  Lab 03/24/20 1354 03/24/20 1354 03/25/20 0345 03/26/20 0316 03/27/20 0435 03/28/20 0533 03/29/20 0334  AST 15  --  16  --   --   --   --   ALT 9  --  13  --   --   --   --   ALKPHOS 224*  --  166*  --   --   --   --   BILITOT 0.5  --  0.6  --   --   --   --   PROT 6.7  --  5.6*  --   --   --   --   ALBUMIN 2.6*   < > 2.5* 2.6* 2.4* 2.2* 2.5*   < > = values in this interval not displayed.   No results for input(s): LIPASE, AMYLASE in the last 168 hours. Recent Labs  Lab 03/24/20 1731  AMMONIA 24   CBC: Recent Labs  Lab 03/24/20 1354 03/24/20 1354 03/25/20 0345 03/25/20 0345 03/26/20 0316 03/27/20 0435 03/28/20 0533 03/28/20 1805 03/29/20 0334  WBC 7.6  --  6.4  --  6.0  --   --   --   --   NEUTROABS 5.7  --  4.4  --   --   --   --   --   --   HGB 8.9*  --  7.7*   < > 9.1* 7.9* 7.9* 9.8* 9.1*  HCT 28.9*   < > 25.0*   < > 29.7* 26.2* 25.3* 32.4* 29.9*  MCV 96.0  --  97.7  --  97.7  --   --   --   --   PLT 210  --  188  --  196  --   --   --   --    < > = values in this interval not displayed.   Cardiac Enzymes: Recent Labs  Lab 03/24/20 1731  CKTOTAL 52   BNP: BNP (last 3 results) Recent Labs    01/25/20 1633 03/24/20 1731  BNP 662.0* 722.9*    ProBNP (last 3 results) No results for input(s): PROBNP in the last 8760 hours.  CBG: No results for input(s): GLUCAP in the last 168 hours.     Signed:  Irine Seal MD.  [facility] 03/29/2020, 12:50 PM

## 2020-03-29 NOTE — Care Management Important Message (Signed)
Important Message  Patient Details IM Letter given to the Patient. Name: MAXEMILIANO RIEL MRN: [id number] Date of Birth: 29-Apr-1931   Medicare Important Message Given:  Yes     Kerin Salen 03/29/2020, 9:23 AM

## 2020-03-30 ENCOUNTER — Inpatient Hospital Stay: Payer: Medicare Other | Admitting: Hematology

## 2020-03-30 ENCOUNTER — Inpatient Hospital Stay: Payer: Medicare Other

## 2020-03-30 ENCOUNTER — Telehealth: Payer: Self-pay | Admitting: *Deleted

## 2020-03-30 NOTE — Telephone Encounter (Signed)
All form received faxed

## 2020-03-30 NOTE — Telephone Encounter (Signed)
Contacted by Langley Gauss with [facility] - requested confirmation from Dr. [name] that they would provide in home palliative care alongside patient's established home care service. Dr. [name] informed and is in agreement. Contacted [facility] - information given to Trent.

## 2020-03-31 ENCOUNTER — Telehealth: Payer: Self-pay

## 2020-03-31 ENCOUNTER — Ambulatory Visit: Payer: Medicare Other | Admitting: Cardiology

## 2020-03-31 ENCOUNTER — Other Ambulatory Visit: Payer: Self-pay

## 2020-03-31 ENCOUNTER — Telehealth: Payer: Self-pay | Admitting: Interventional Cardiology

## 2020-03-31 ENCOUNTER — Encounter: Payer: Self-pay | Admitting: Nurse Practitioner

## 2020-03-31 ENCOUNTER — Other Ambulatory Visit: Payer: Medicare Other | Admitting: Nurse Practitioner

## 2020-03-31 DIAGNOSIS — Z515 Encounter for palliative care: Secondary | ICD-10-CM

## 2020-03-31 DIAGNOSIS — C61 Malignant neoplasm of prostate: Secondary | ICD-10-CM

## 2020-03-31 DIAGNOSIS — C7951 Secondary malignant neoplasm of bone: Secondary | ICD-10-CM | POA: Diagnosis not present

## 2020-03-31 NOTE — Telephone Encounter (Signed)
Will send to primary card nurse °

## 2020-03-31 NOTE — Telephone Encounter (Signed)
Transition Care Management Unsuccessful Follow-up Telephone Call  Date of discharge and from where:  03/29/20 [facility]  Attempts:  1st Attempt  Reason for unsuccessful TCM follow-up call:  Left voice message for pt to reach me at [phone]

## 2020-03-31 NOTE — Telephone Encounter (Signed)
cristin from pallative care is requesting hospice orders for patient and would like to know if dr.parker will be his attending provider  Please fax to [phone] Thank you

## 2020-03-31 NOTE — Telephone Encounter (Signed)
Left message to call back  

## 2020-03-31 NOTE — Telephone Encounter (Signed)
Pt c/o medication issue:  1. Name of Medication: furosemide (LASIX) 40 MG tablet  2. How are you currently taking this medication (dosage and times per day)? Taking as needed per hospital   3. Are you having a reaction (difficulty breathing--STAT)? No   4. What is your medication issue? [name] is calling stating Dossie's Lasix was changed to be taken as needed while in the hospital and she is requesting a nurse call her to discuss. Please advise.

## 2020-03-31 NOTE — Telephone Encounter (Signed)
Spoke with patient's daughter [name] and scheduled a televisit  Palliative Consult for 03/31/20 @ 2PM  Consent obtained; updated Outlook/Netsmart/Team List and Epic.

## 2020-03-31 NOTE — Telephone Encounter (Signed)
Please advise 

## 2020-03-31 NOTE — Progress Notes (Signed)
[name] Consult Note Telephone: [phone]  Fax: [phone]  PATIENT NAME: Tony [name] DOB: 12-10-1930 MRN: [id number]  PRIMARY CARE PROVIDER:   [name], MD  REFERRING PROVIDER:  [name], MD [address] [place],  [place] [address]  RESPONSIBLE PARTY:   Daughter Tony [name] [phone]  Due to the COVID-19 crisis, this visit was done via telemedicine from my office and it was initiated and consent by this patient and or family.  I was asked by Dr [name] to complete a Palliative consult for Tony [name] for complex medical decision making.  1. Advance Care Planning; DNR wishes are for Hospice services. /MOST form already completed in vynca to include DNR, limited additional interventions do not intubate, no feeding tube but wishes are for antibiotics, IV fluids.  2. Goals of Care: Goals include to maximize quality of life and symptom management. Our advance care planning conversation included a discussion about:     The value and importance of advance care planning   Exploration of personal, cultural or spiritual beliefs that might influence medical decisions   Exploration of goals of care in the event of a sudden injury or illness   Identification and preparation of a healthcare agent   Review and updating or creation of an  advance directive document. 3. Palliative care encounter; Palliative care encounter; Palliative medicine team will continue to support patient, patient's family, and medical team. Visit consisted of counseling and education dealing with the complex and emotionally intense issues of symptom management and palliative care in the setting of serious and potentially life-threatening illness  4. F/u if not admitted to hospice I spent 65 minutes providing this consultation,  from 2:00pm to 3:05pm. More than 50% of the time in this consultation was spent coordinating communication.   HISTORY OF  PRESENT ILLNESS:  Tony [name] is a 86 y.o. year old male with multiple medical problems including Prostate cancer with metastasis to bone, Coronary artery disease with stents, stroke,  acute kidney failure with one time on chronic kidney disease heart failure, vascular dementia, hypertension, hyperlipidemia, BPH, gout, osteoarthritis, anemia. Hospitalized 9/26 / 2021 to 10 / 06/2019 for  new acute combined congestive heart failure. A cardiology patient refused A ischemic cardiac workup or any invasive procedures. Placed on Lasix with edema resolved. Palliative consulted daughter Tony [name] is Health Care power of attorney with most form completed. Hospitalized 11 / 4 / 2021 to 65 / 12 / 2021 for significant lower extremity edema associated with congestive heart failure. Underwent pet scan and found to have acute renal failure with significant rise in creatinine. Workout significant for acute kidney injury on chronic kidney disease, encephalopathy showing Progressive vascular dementia with MRI showing evidence of small severe vessel disease and multi lunar infarcts contributing too late mental status to climb. Macrocytic anemia with metastatic cancer underlying hemoglobin 7.5 received transfusion. Metastatic prostate cancer with oncology following outpatient. Recent diagnosis of congestive heart failure with ef 35 to 40%. Severe osteoarthritis chronic pain currently on fentanyl patch and Oxycodone. Physical Therapy was arranged upon discharge. Hospitalized 11/24 / 2021 to 11 / 29 / 2021 for confusion, fatigue and noted creatinine up to 4.0 from baseline of 3. Trouble getting out of bed, fatigued more confused with repeating questions. Metastatic prostate cancer, currently is on steroids and zytiga but being held due to side effects of causing congestive heart failure. He was gently hydrated and diuretics with antihypertensives held during hospitalization  with Improvement of renal function and creatinine down to  2.3 the day of discharge. Acute metabolic encephalopathy likely secondary to dehydration and renal failure improved and resolve the day of discharge. Vascular dementia with sundowning and delirium on occasion. Prostate cancer with metastatic to the bone recommended palliative consult a patient for goals of care.  I called Tony [name], Tony [name] daughter for [facility] initial consult. We talked about purpose of PC visit. Tony [name] in agreement. We talked about PMH, reviewed social history, family history. Life review. 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 Tony [name] does have PCS services in the home for caregivers. We talked about chronic disease progression of CHF, CKD, Prostate cancer with metastasis to bone. We talked about difficulty with treatment options with recent 3 hospitalizations. Difficulty with diuretics managing chf without worsening CKD. Also Tony [name] talked about Oncology has no further treatment options available. We talked about disease progression, complexity. We talked about decision making as Tony [name] is HCPOA. We talked about MOST form which was completed during hospitalization. We talked about [name] currently functional level, he requires assistance for transfers, mobility, bathing, dressing as prior to hospitalizations he was able to do more on his own. He does feed himself though very poor appetite with 9lb weight loss. Tony [name] endorses it is very hard to get Tony [name] to eat, take his medications, get out of bed, bath. Tony [name] endorses [name] just wants to sleep. We talked about physical therapy which has been a struggle getting [name] to do exercises. We talked about option of Hospice benefit through [facility] program. Tony [name] endorses she would like for [place] to continue discussion with Tony [name] about hospice. Tony [name] on speaker, myself and Tony [name] talked. We talked about how he has been feeling. [name] he is great, no problems. [name] endorses he does have pain on occasion in bilateral knees making it hard to walk. We talked about his functional level, what he can do. We talked about pain regimen which he does get relief. We talked about his wishes for his care. We talked about quality of life as he works as a Theme park manager. We talked about disease progression. We talked about Hospice benefit under Medicare. Mr. Brackins endorses he would be okay with Hospice services. We talked about the process of the order, will review with Hospice physicians, then obtain order from primary provider. We talked about role of Palliative care in plan of care. Therapeutic listening, emotional support provided. Questions answered to satisfaction. Contact information provided.   Palliative Care was asked to help address goals of care.   CODE STATUS: DNR  PPS: 40% HOSPICE ELIGIBILITY/DIAGNOSIS: TBD  PAST MEDICAL HISTORY:  Past Medical History:  Diagnosis Date  . BPH associated with nocturia    flomax 0.4--> 0.8 mg trial. nocturia if has coffee. some incontinence  . CAD (coronary artery disease)    LAD Stent 2012. 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   . Gout    no rx. apparently 1x in past  . History of stroke    no aspirin before stroke. no deficits. slurred words at time of stroke  . Hyperlipidemia   . Hypertension     SOCIAL HX:  Social History   Tobacco Use  . Smoking status: Former Smoker    Packs/day: 0.50    Years: 1.00    Pack years: 0.50    Types:  Cigarettes    Quit date: 05/01/1952    Years since quitting: 67.9  . Smokeless tobacco: Never Used  Substance Use Topics  . Alcohol use: No    ALLERGIES:  Allergies  Allergen Reactions  . Gabapentin Other (See Comments)    Patient states caused hallucinations      PERTINENT MEDICATIONS:  Outpatient Encounter Medications as of 03/31/2020  Medication Sig  . aspirin EC 81 MG tablet Take 1 tablet (81 mg total) by  mouth daily.  . calcium-vitamin D (OSCAL 500/200 D-3) 500-200 MG-UNIT tablet Take 1 tablet by mouth daily with breakfast.  . carvedilol (COREG) 12.5 MG tablet Take 1 tablet (12.5 mg total) by mouth 2 (two) times daily with a meal.  . docusate sodium (COLACE) 100 MG capsule Take 100 mg by mouth daily.  . fentaNYL (DURAGESIC) 25 MCG/HR Place 1 patch onto the skin every 3 (three) days.  . furosemide (LASIX) 40 MG tablet Take 1 tablet (40 mg total) by mouth daily as needed for fluid or edema. Take 1 tablet 40mg  as needed for swelling or weight gain of 3 pounds or more in a day or 5 pounds or more in a week.  . isosorbide-hydrALAZINE (BIDIL) 20-37.5 MG tablet Take 1 tablet by mouth in the morning and at bedtime.  . Multiple Vitamins-Minerals (MULTI COMPLETE PO) Take 1 tablet by mouth daily.   . nitroGLYCERIN (NITROSTAT) 0.4 MG SL tablet Place 1 tablet (0.4 mg total) under the tongue every 5 (five) minutes as needed for chest pain (3 maximum before seeking care). (Patient taking differently: Place 0.4 mg under the tongue every 5 (five) minutes as needed for chest pain. )  . Nutritional Supplements (NEPRO) LIQD Take 237 mLs by mouth in the morning, at noon, and at bedtime. (Patient not taking: Reported on 03/24/2020)  . Omega-3 1000 MG CAPS Take 1 capsule by mouth daily.   . polyethylene glycol (MIRALAX) packet Take 17 g by mouth daily. (Patient taking differently: Take 17 g by mouth daily as needed for moderate constipation. )  . predniSONE (DELTASONE) 5 MG tablet Take 1 tablet (5 mg total) by mouth daily with breakfast.  . tamsulosin (FLOMAX) 0.4 MG CAPS capsule TAKE 1 CAPSULE BY MOUTH TWICE DAILY (Patient taking differently: Take 0.4 mg by mouth in the morning and at bedtime. )  . vitamin B-12 (CYANOCOBALAMIN) 1000 MCG tablet Take 1 tablet (1,000 mcg total) by mouth daily.  . Vitamin D, Ergocalciferol, (DRISDOL) 1.25 MG (50000 UNIT) CAPS capsule Take 50,000 Units by mouth See admin instructions. Takes 3  times a week ,Monday, Wednesday and Friday   No facility-administered encounter medications on file as of 03/31/2020.    PHYSICAL EXAM:  Deferred  Skarlet Lyons Z Jerrit Horen, NP

## 2020-04-01 NOTE — Telephone Encounter (Signed)
Ok to give verbal orders they need and I am ok with being hospice attending.  [name]. [name], MD 04/01/2020 10:31 AM

## 2020-04-01 NOTE — Telephone Encounter (Signed)
Attempted to contact patient's daughter a few times and there was no answer and VM did not pick up.

## 2020-04-01 NOTE — Telephone Encounter (Signed)
Pt has follow up with cardiology on Monday and oncology Tuesday. If patient is now under Hospice care he does not qualify for TCM services. Please advised if you would like to schedule pt for hospital follow up in PCP setting. Thank you!

## 2020-04-01 NOTE — Telephone Encounter (Signed)
Do not need to follow up here unless needed.   [name]. [name], MD 04/01/2020 4:34 PM

## 2020-04-01 NOTE — Telephone Encounter (Signed)
Pt daughter called in returning [place] call   Best number [phone]

## 2020-04-02 NOTE — Telephone Encounter (Signed)
Pt daughter notified [employer] form faxed

## 2020-04-04 NOTE — Progress Notes (Deleted)
Cardiology Office Note   Date:  04/04/2020   ID:  Tony [name], DOB 07/28/1930, MRN [id number]  PCP:  [name], MD  Cardiologist:  Dr. Irish Lack, MD   No chief complaint on file.  History of Present Illness: Tony [name] is a 86 y.o. male who presents for for hospital follow up, seen for Dr. Irish Lack.   [name] has a hx of CAD s/p prior stenting to LAD in 2012 with occluded RCA with collaterals also noted at that time, prior stroke in 2012, hypertension, hyperlipidemia with intolerance to statins, CKD stage IV, BPH, gout, chronic anemia, and stage IV prostate cancer with metastasis to bone  He had a NSTEMI in 12/2010 that was treated at [facility] in [place]. At that time, he was found to be in complete heart block secondary to ACS. Cardiac cath showed 100% RCA occlusion with left to right collaterals and 80% LAD stenosis. He underwent successful PTCA and stenting of LAD lesion. RCA lesion was treated medically. He also reportedly had a stroke and kidney failure (requiring 1 session of dialysis) at same time of MI. He was first seen by Dr. Irish Lack in 2018 to get established with a Cardiologist in [place].   On 01/27/20 he was seen in consultation for the evaluation of SOB and LE edema. Echo at that time showed new LV dysfunction with an EF 35-40% with global HK and mildly reduced RVF with moderate PHTN. He was given IV lasix with excellent response. He was noted to be on chronic steroids for his cancer which was likely causing worsening edema.   Per chart review, he was adamant above not wanting invasive workup of his new LV dysfunction and opted  for conservative measures. No stress test, coronary CT or LHC was performed. Plan was to treat medically with ASA, beta blocker, carvedilol and Bidil   In follow up with myself 02/20/20 he presented with his daughter. He had been working with home health PT/OT and felt better. Daughter seemed very attuned to weighing  daily and following his medications closely.  She had lots of questions regarding weight gain and what do do in those instances. We decided to add PRN Lasix to his regimen at that time.   He was then hospitalized for increased confusion and fatigue>>>discharged 03/29/20. His creatinine was found ot be markedly elevated to 4.0 with some improvement to 2.34 on day of discharge. His Bidil and carvedilol were resumed with plans for OP follow up with nephrology. Cardiology was not consulted. He was seen by palliative care medicine during his hospitalization to establish goals of care. He was seen in f/u with them 03/31/20 at which time goal was to maximize his quality of life and symptom management   Today he presents for follow up and states that since discharge      1.  New onset systolic CHF/presumed ischemic cardiomyopathy: -Echocardiogram during recent hospitalization with LVEF at 35 to 40%, down from 50% at time of prior MI with moderate LVH and global hypokinesis.  RV also mildly enlarged with mildly reduced systolic function and mildly elevated PSAP at 42 mmHg -Patient deferred invasive work-up including stress test, CCTA or LHC and opted for medical management -Currently managed with 40 mg Lasix daily however we added 20 mg as needed dosing for weight greater than 3 pounds in 1 day and 5 pounds in 1 week -Continue BiDil, carvedilol -Continue low salt, fluid restriction  2. CAD s/p stenting to LAD  in 2012/Suspected demand ischemia: -Prior NSTEMI in 2012 with stenting to LAD also noted to have RCA with collaterals now with new LV dysfunction  -Patient deferred CV workup  -Continue medical management with ASA, beta-blocker  -Intolerant to statin therapy  -Denies anginal symptoms  3. HTN: -Stable, 132/70 today -Continue current regimen with no change  4. HLD: -Intolerant to statin therapy secondary to myalgias -Last LDL, 129  -May need referral to lipid clinic   5. Recent acute on  chronic kidney disease stage IV: -Creatinine elevated around the 2.0 mark with a baseline at 2.8 -Has nephrology referral>> awaiting callback    1 acute renal failure on chronic kidney disease stage IIIb Baseline creatinine approximately 2.8. Patient noted to have had a progressive decline in his kidney function. Acute renal failure likely secondary to a prerenal azotemia in the setting of diuretics. Urine sodium of 39. Urine creatinine of 118.14. Urinalysis nitrite negative leukocytes negative, negative for protein. Renal ultrasound negative for hydronephrosis. Patient was placed on gentle hydration and patient's diuretics and antihypertensive medications held during the hospitalization.  Renal function improved and creatinine was down to 2.34 by day of discharge.  Patient's BiDil and Coreg will be resumed gradually on discharge.  Patient's diuretics have been changed to as needed.  Outpatient follow-up with nephrology, Dr. Joelyn Oms 1 week post discharge.   2. Hypophosphatemia Repleted during the hospitalization.  Outpatient follow-up with nephrology.    3. Acute metabolic encephalopathy Questionable etiology.Likely secondary to problem #1 and dehydration.   Patient hydrated gently with IV fluids.  Metabolic encephalopathy improved and had resolved by day of discharge.   4. Vascular dementia Patient with some sundowning and delirium on occasion.  Alert and oriented.  Outpatient follow-up.  5. Prostate cancer with metastases to the bone Oncology notified of patient's admission via epic. Palliative care consulted for goals of care.  Outpatient follow-up with oncology as previously scheduled.  Palliative care followed patient during the hospitalization.  Patient will need outpatient palliative care to follow in the community.  6. Hyperlipidemia Patient maintained on home regimen Zetia.  7. Normocytic anemia anemia/anemia of chronic disease Patient with no overt bleeding.  Hemoglobin was at 7.9 earlier on during the hospitalization.  Anemia in the setting of metastatic prostate cancer. Patient with no overt bleeding. Anemia panel from January 27, 2020 with iron level of 43, TIBC of 159, ferritin of May 02, 2006, folate of 26.7. Per RN patient's daughter stated hematology recommending that patient's hemoglobin needs to be greater than 8 and requesting patient be transfused prior to discharge.  Patient transfused 1 unit packed red blood with hemoglobin stabilizing at 9.1 by day of discharge.  Outpatient follow-up with hematology/oncology.   8. Chronic systolic heart failure Patient noted to be more on the dry side on admission and as such diuretics were held and patient placed on gentle hydration.  Blood pressure improved.  BiDil and Coreg were held and will be resumed on discharge.  Patient's Lasix will be changed to as needed on discharge.  Outpatient follow-up with PCP.  9. Hypertension Patient noted to have borderline soft blood pressures on presentation.  Patient's diuretics, Coreg, BiDil were held and patient hydrated gently with IV fluids.  Blood pressure improved.  Diuretics were changed to as needed on discharge.  Patient's Coreg and BiDil will be resumed gradually on discharge.  Outpatient follow-up with PCP and nephrology.   10. Coronary artery disease Stable.   Patient's diuretics were held during the hospitalization due to  worsening renal function on admission as well as borderline blood pressure.  Beta-blocker and BiDil were also held and will be resumed on discharge.  Patient maintained on aspirin.  Outpatient follow-up with PCP.    11. BPH Patient maintained on home regimen of Flomax.  Outpatient follow-up.     Past Medical History:  Diagnosis Date  . BPH associated with nocturia    flomax 0.4--> 0.8 mg trial. nocturia if has coffee. some incontinence  . CAD (coronary artery disease)    LAD Stent 2012. Stroke and kidney failure  (dialysis x1) at same time of MI.   . Gout    no rx. apparently 1x in past  . History of stroke    no aspirin before stroke. no deficits. slurred words at time of stroke  . Hyperlipidemia   . Hypertension     Past Surgical History:  Procedure Laterality Date  . CARDIAC SURGERY     Stint  . CORONARY STENT PLACEMENT    . left arm fracture s/p surgery    . right leg fracture s.p surgery- screws like arm       Current Outpatient Medications  Medication Sig Dispense Refill  . aspirin EC 81 MG tablet Take 1 tablet (81 mg total) by mouth daily. 90 tablet 3  . calcium-vitamin D (OSCAL 500/200 D-3) 500-200 MG-UNIT tablet Take 1 tablet by mouth daily with breakfast. 30 tablet 2  . carvedilol (COREG) 12.5 MG tablet Take 1 tablet (12.5 mg total) by mouth 2 (two) times daily with a meal. 90 tablet 1  . docusate sodium (COLACE) 100 MG capsule Take 100 mg by mouth daily.    . fentaNYL (DURAGESIC) 25 MCG/HR Place 1 patch onto the skin every 3 (three) days. 10 patch 0  . furosemide (LASIX) 40 MG tablet Take 1 tablet (40 mg total) by mouth daily as needed for fluid or edema. Take 1 tablet 40mg  as needed for swelling or weight gain of 3 pounds or more in a day or 5 pounds or more in a week. 135 tablet 3  . isosorbide-hydrALAZINE (BIDIL) 20-37.5 MG tablet Take 1 tablet by mouth in the morning and at bedtime.    . Multiple Vitamins-Minerals (MULTI COMPLETE PO) Take 1 tablet by mouth daily.     . nitroGLYCERIN (NITROSTAT) 0.4 MG SL tablet Place 1 tablet (0.4 mg total) under the tongue every 5 (five) minutes as needed for chest pain (3 maximum before seeking care). (Patient taking differently: Place 0.4 mg under the tongue every 5 (five) minutes as needed for chest pain. ) 30 tablet 0  . Nutritional Supplements (NEPRO) LIQD Take 237 mLs by mouth in the morning, at noon, and at bedtime. (Patient not taking: Reported on 03/24/2020) 21330 mL 5  . Omega-3 1000 MG CAPS Take 1 capsule by mouth daily.     .  polyethylene glycol (MIRALAX) packet Take 17 g by mouth daily. (Patient taking differently: Take 17 g by mouth daily as needed for moderate constipation. ) 30 each 1  . predniSONE (DELTASONE) 5 MG tablet Take 1 tablet (5 mg total) by mouth daily with breakfast. 30 tablet 8  . tamsulosin (FLOMAX) 0.4 MG CAPS capsule TAKE 1 CAPSULE BY MOUTH TWICE DAILY (Patient taking differently: Take 0.4 mg by mouth in the morning and at bedtime. ) 180 capsule 3  . vitamin B-12 (CYANOCOBALAMIN) 1000 MCG tablet Take 1 tablet (1,000 mcg total) by mouth daily. 30 tablet 0  . Vitamin D, Ergocalciferol, (DRISDOL) 1.25 MG (  50000 UNIT) CAPS capsule Take 50,000 Units by mouth See admin instructions. Takes 3 times a week ,Monday, Wednesday and Friday     No current facility-administered medications for this visit.    Allergies:   Gabapentin    Social History:  The patient  reports that he quit smoking about 67 years ago. His smoking use included cigarettes. He has a 0.50 pack-year smoking history. He has never used smokeless tobacco. He reports that he does not drink alcohol and does not use drugs.   Family History:  The patient's ***family history includes Breast cancer in his mother; Heart disease in his brother and father; Prostate cancer in his brother.    ROS:  Please see the history of present illness.   Otherwise, review of systems are positive for {NONE DEFAULTED:18576::"none"}.   All other systems are reviewed and negative.    PHYSICAL EXAM: VS:  There were no vitals taken for this visit. , BMI There is no height or weight on file to calculate BMI. GEN: Well nourished, well developed, in no acute distress HEENT: normal Neck: no JVD, carotid bruits, or masses Cardiac: ***RRR; no murmurs, rubs, or gallops,no edema  Respiratory:  clear to auscultation bilaterally, normal work of breathing GI: soft, nontender, nondistended, + BS MS: no deformity or atrophy Skin: warm and dry, no rash Neuro:  Strength and  sensation are intact Psych: euthymic mood, full affect   EKG:  EKG {ACTION; IS/IS BOF:75102585} ordered today. The ekg ordered today demonstrates ***   Recent Labs: 03/24/2020: B Natriuretic Peptide 722.9 03/25/2020: ALT 13; TSH 1.165 03/26/2020: Magnesium 2.1; Platelets 196 03/29/2020: BUN 30; Creatinine, Ser 2.34; Hemoglobin 9.1; Potassium 4.6; Sodium 143    Lipid Panel    Component Value Date/Time   CHOL 210 (H) 01/29/2020 0545   TRIG 149 01/29/2020 0545   HDL 51 01/29/2020 0545   CHOLHDL 4.1 01/29/2020 0545   VLDL 30 01/29/2020 0545   LDLCALC 129 (H) 01/29/2020 0545   LDLDIRECT 141.0 10/30/2016 1528      Wt Readings from Last 3 Encounters:  03/28/20 182 lb 1.6 oz (82.6 kg)  03/11/20 183 lb 3.2 oz (83.1 kg)  02/26/20 176 lb (79.8 kg)      Other studies Reviewed: Additional studies/ records that were reviewed today include: ***. Review of the above records demonstrates: ***  Echo 01/26/20 1. Left ventricular ejection fraction, by estimation, is 35 to 40%. The  left ventricle has moderately decreased function. The left ventricle  demonstrates global hypokinesis. There is moderate concentric left  ventricular hypertrophy. Left ventricular  diastolic parameters are consistent with Grade I diastolic dysfunction  (impaired relaxation).  2. Right ventricular systolic function is mildly reduced. The right  ventricular size is mildly enlarged. There is mildly elevated pulmonary  artery systolic pressure. The estimated right ventricular systolic  pressure is 27.7 mmHg.  3. The mitral valve is normal in structure. Mild mitral valve  regurgitation. No evidence of mitral stenosis.  4. Tricuspid valve regurgitation is moderate.  5. The aortic valve is normal in structure. Aortic valve regurgitation is  mild. No aortic stenosis is present.  6. The inferior vena cava is normal in size with greater than 50%  respiratory variability, suggesting right atrial pressure of 3  mmHg.      ASSESSMENT AND PLAN:  1.  ***   Current medicines are reviewed at length with the patient today.  The patient {ACTIONS; HAS/DOES NOT HAVE:19233} concerns regarding medicines.  The following changes have been  made:  {PLAN; NO CHANGE:13088:s}  Labs/ tests ordered today include: *** No orders of the defined types were placed in this encounter.    Disposition:   FU with *** in {gen number 1-14:643142} {Days to years:10300}  Signed, Kathyrn Drown, NP  04/04/2020 7:29 AM    [facility] [place], [place], [place]  [address] Phone: [phone]; Fax: [phone]

## 2020-04-05 ENCOUNTER — Telehealth: Payer: Self-pay

## 2020-04-05 ENCOUNTER — Ambulatory Visit: Payer: Medicare Other | Admitting: Cardiology

## 2020-04-05 NOTE — Telephone Encounter (Signed)
Pt.'s daughter would like Hasna to give her a call. She states she is having a problem with the correct form to be sent. She does not want to work with [facility] team anymore on the form.

## 2020-04-05 NOTE — Telephone Encounter (Signed)
Called pt's Daughter back. She stated that she received a call from [facility] that the forms that we faxed was missing some information.  Went over the changes that needed to be completed  with her and fix them. Faxed the completed form to [facility] at [phone]; receipt confirmation received. Pt's daughter notified.

## 2020-04-06 ENCOUNTER — Inpatient Hospital Stay: Payer: Medicare Other

## 2020-04-06 ENCOUNTER — Other Ambulatory Visit: Payer: Medicare Other

## 2020-04-06 ENCOUNTER — Ambulatory Visit: Payer: Medicare Other

## 2020-04-06 ENCOUNTER — Inpatient Hospital Stay: Payer: Medicare Other | Admitting: Hematology

## 2020-04-12 NOTE — Telephone Encounter (Signed)
Pt.'s daughter is calling in. Tony [name] states they have not received the fax. Daughter is asking if we can refax it asap. Please call daughter once faxed

## 2020-04-13 NOTE — Telephone Encounter (Signed)
Paperwork was printed from the media tab and refaxed to [place]. A successful fax confirmation was received. Daughter, [name], was called and notified. She requested a copy of this form. There has been a copy printed and put up front, as well as a copy of our confirmation, incase [facility] were to give her any more troubles about not receiving fax.

## 2020-04-14 ENCOUNTER — Telehealth: Payer: Self-pay

## 2020-04-14 NOTE — Telephone Encounter (Signed)
[facility] worker called to let us know that pt.'s blood pressure medication is not on their formulary . Since it is a combination medication, it is not on their list. If he were to continue to take it then he would have to pay out of pocket. Daughter told worker that he takes his meds only 50% of the time and that today his blood pressure was great. [facility] worker thinks he may not need the medication. If Dr. [name] does want him to continue on this medication he needs some more.   [phone]

## 2020-04-15 NOTE — Telephone Encounter (Signed)
Are they referring to his bidil? He is on multiple BP medications. IF his BP looks good off then I am ok with him staying off the medication as long as someone is monitoring it.  Tony [name]. [name], MD 04/15/2020 8:31 AM

## 2020-04-15 NOTE — Telephone Encounter (Signed)
Yes! That is the medication she was referring to. So sorry for leaving that out.

## 2020-04-15 NOTE — Telephone Encounter (Signed)
Please advise 

## 2020-04-28 ENCOUNTER — Other Ambulatory Visit: Payer: Self-pay | Admitting: Family Medicine

## 2020-05-04 ENCOUNTER — Ambulatory Visit: Payer: Medicare Other

## 2020-05-04 ENCOUNTER — Other Ambulatory Visit: Payer: Medicare Other

## 2020-05-05 NOTE — Telephone Encounter (Signed)
See note

## 2020-05-05 NOTE — Telephone Encounter (Signed)
Pt daughter called regarding this information. States hospice will not cover Bidil. It is a combination medication so they need prescriptions of the 2 medications that create bidil. Please advise.

## 2020-05-07 ENCOUNTER — Other Ambulatory Visit: Payer: Self-pay | Admitting: Family Medicine

## 2020-05-07 ENCOUNTER — Other Ambulatory Visit: Payer: Self-pay | Admitting: *Deleted

## 2020-05-07 MED ORDER — ISOSORBIDE DINITRATE 20 MG PO TABS: 20.0000 mg | ORAL_TABLET | Freq: Two times a day (BID) | ORAL | 1 refills | Status: DC

## 2020-05-07 MED ORDER — HYDRALAZINE HCL 25 MG PO TABS: 25.0000 mg | ORAL_TABLET | Freq: Three times a day (TID) | ORAL | 1 refills | Status: AC

## 2020-05-07 NOTE — Telephone Encounter (Signed)
  Pt daughter [name] requesting refills on Rx Coreg and Prednisone  Last prescribed by [facility] Dr

## 2020-05-07 NOTE — Telephone Encounter (Signed)
Ok to send in as two separate prescriptions.

## 2020-05-07 NOTE — Progress Notes (Signed)
isosorbide

## 2020-05-07 NOTE — Telephone Encounter (Signed)
Rx order   Pt daughter [name] requesting refills on Rx Coreg and Prednisone  Last prescribed by [facility] [address]

## 2020-05-10 MED ORDER — PREDNISONE 5 MG PO TABS: 5.0000 mg | ORAL_TABLET | Freq: Every day | ORAL | 3 refills | Status: AC

## 2020-05-10 MED ORDER — CARVEDILOL 12.5 MG PO TABS: 12.5000 mg | ORAL_TABLET | Freq: Two times a day (BID) | ORAL | 1 refills | Status: AC

## 2020-05-10 NOTE — Addendum Note (Signed)
Addended by: Clyde Lundborg A on: 05/10/2020 09:46 AM   Modules accepted: Orders

## 2020-05-10 NOTE — Telephone Encounter (Signed)
Rx has been sent  

## 2020-05-10 NOTE — Telephone Encounter (Signed)
Ok with me. Please place any necessary orders. 

## 2020-05-13 ENCOUNTER — Other Ambulatory Visit: Payer: Self-pay | Admitting: *Deleted

## 2020-05-13 ENCOUNTER — Telehealth: Payer: Self-pay | Admitting: *Deleted

## 2020-05-13 MED ORDER — ISOSORBIDE MONONITRATE ER 30 MG PO TB24: 30.0000 mg | ORAL_TABLET | Freq: Every day | ORAL | 1 refills | Status: AC

## 2020-05-13 NOTE — Telephone Encounter (Signed)
Mendel Ryder form Hospice call [phone] Stated Rx Bidel not cover under their prefer meds  Rx Indur is in their prefer list  It be ok to change it  FYI Patient not taking his medication 20% of the time

## 2020-05-13 NOTE — Telephone Encounter (Signed)
Yes that is ok to change bidil to imdur and hydralazine. I approved this twice last week.  [name]. [name], MD 05/13/2020 11:00 AM

## 2020-05-13 NOTE — Telephone Encounter (Signed)
Rx send to pharmacy  Imdur 30mg  daily

## 2020-06-01 ENCOUNTER — Other Ambulatory Visit: Payer: Medicare Other

## 2020-06-01 ENCOUNTER — Ambulatory Visit: Payer: Medicare Other | Admitting: Hematology

## 2020-06-01 ENCOUNTER — Ambulatory Visit: Payer: Medicare Other

## 2020-06-15 NOTE — Telephone Encounter (Signed)
Attempted to contact patient and daughter again with no answer and VM did not pick up.

## 2020-06-23 DIAGNOSIS — Z743 Need for continuous supervision: Secondary | ICD-10-CM | POA: Diagnosis not present

## 2020-06-23 DIAGNOSIS — I959 Hypotension, unspecified: Secondary | ICD-10-CM | POA: Diagnosis not present

## 2020-06-23 DIAGNOSIS — R0902 Hypoxemia: Secondary | ICD-10-CM | POA: Diagnosis not present

## 2020-06-23 DIAGNOSIS — R279 Unspecified lack of coordination: Secondary | ICD-10-CM | POA: Diagnosis not present

## 2020-06-23 DIAGNOSIS — R000 Tachycardia, unspecified: Secondary | ICD-10-CM | POA: Diagnosis not present

## 2020-06-28 ENCOUNTER — Telehealth: Payer: Self-pay

## 2020-06-28 NOTE — Telephone Encounter (Signed)
We can do it electronically though I have not yet our new system. Will the funeral home make one available for [place] to sign in the [place] system?  [name]. [name], MD 06/28/2020 3:49 PM

## 2020-06-28 NOTE — Telephone Encounter (Signed)
Called Tashona at funeral home and told her Dr. [name] does not have access to sign electronically please drop off to sign. Tashona verbalized understanding and said they will.

## 2020-06-28 NOTE — Telephone Encounter (Signed)
Caller states she has a death certificate that needs to be filed . Wanting to know if Dr. [name] will be signing. She wants to know if it will be dropped off or can it be done electronically

## 2020-06-28 NOTE — Telephone Encounter (Signed)
Please see below.

## 2020-06-29 DEATH — deceased

## 2020-07-23 ENCOUNTER — Other Ambulatory Visit (HOSPITAL_COMMUNITY): Payer: Self-pay

## 2020-08-23 ENCOUNTER — Other Ambulatory Visit: Payer: Self-pay | Admitting: Family Medicine

## 2020-08-26 ENCOUNTER — Ambulatory Visit: Payer: Medicare Other | Admitting: Family Medicine

## 2022-03-12 IMAGING — CR DG CHEST 2V
2 series · 2 of 2 positions shown · non-contrast
Comparison: Chest radiograph January 25, 2020

CLINICAL DATA: Patient with bilateral feet and leg swelling.

EXAM:
CHEST - 2 VIEW

[w chest lat]
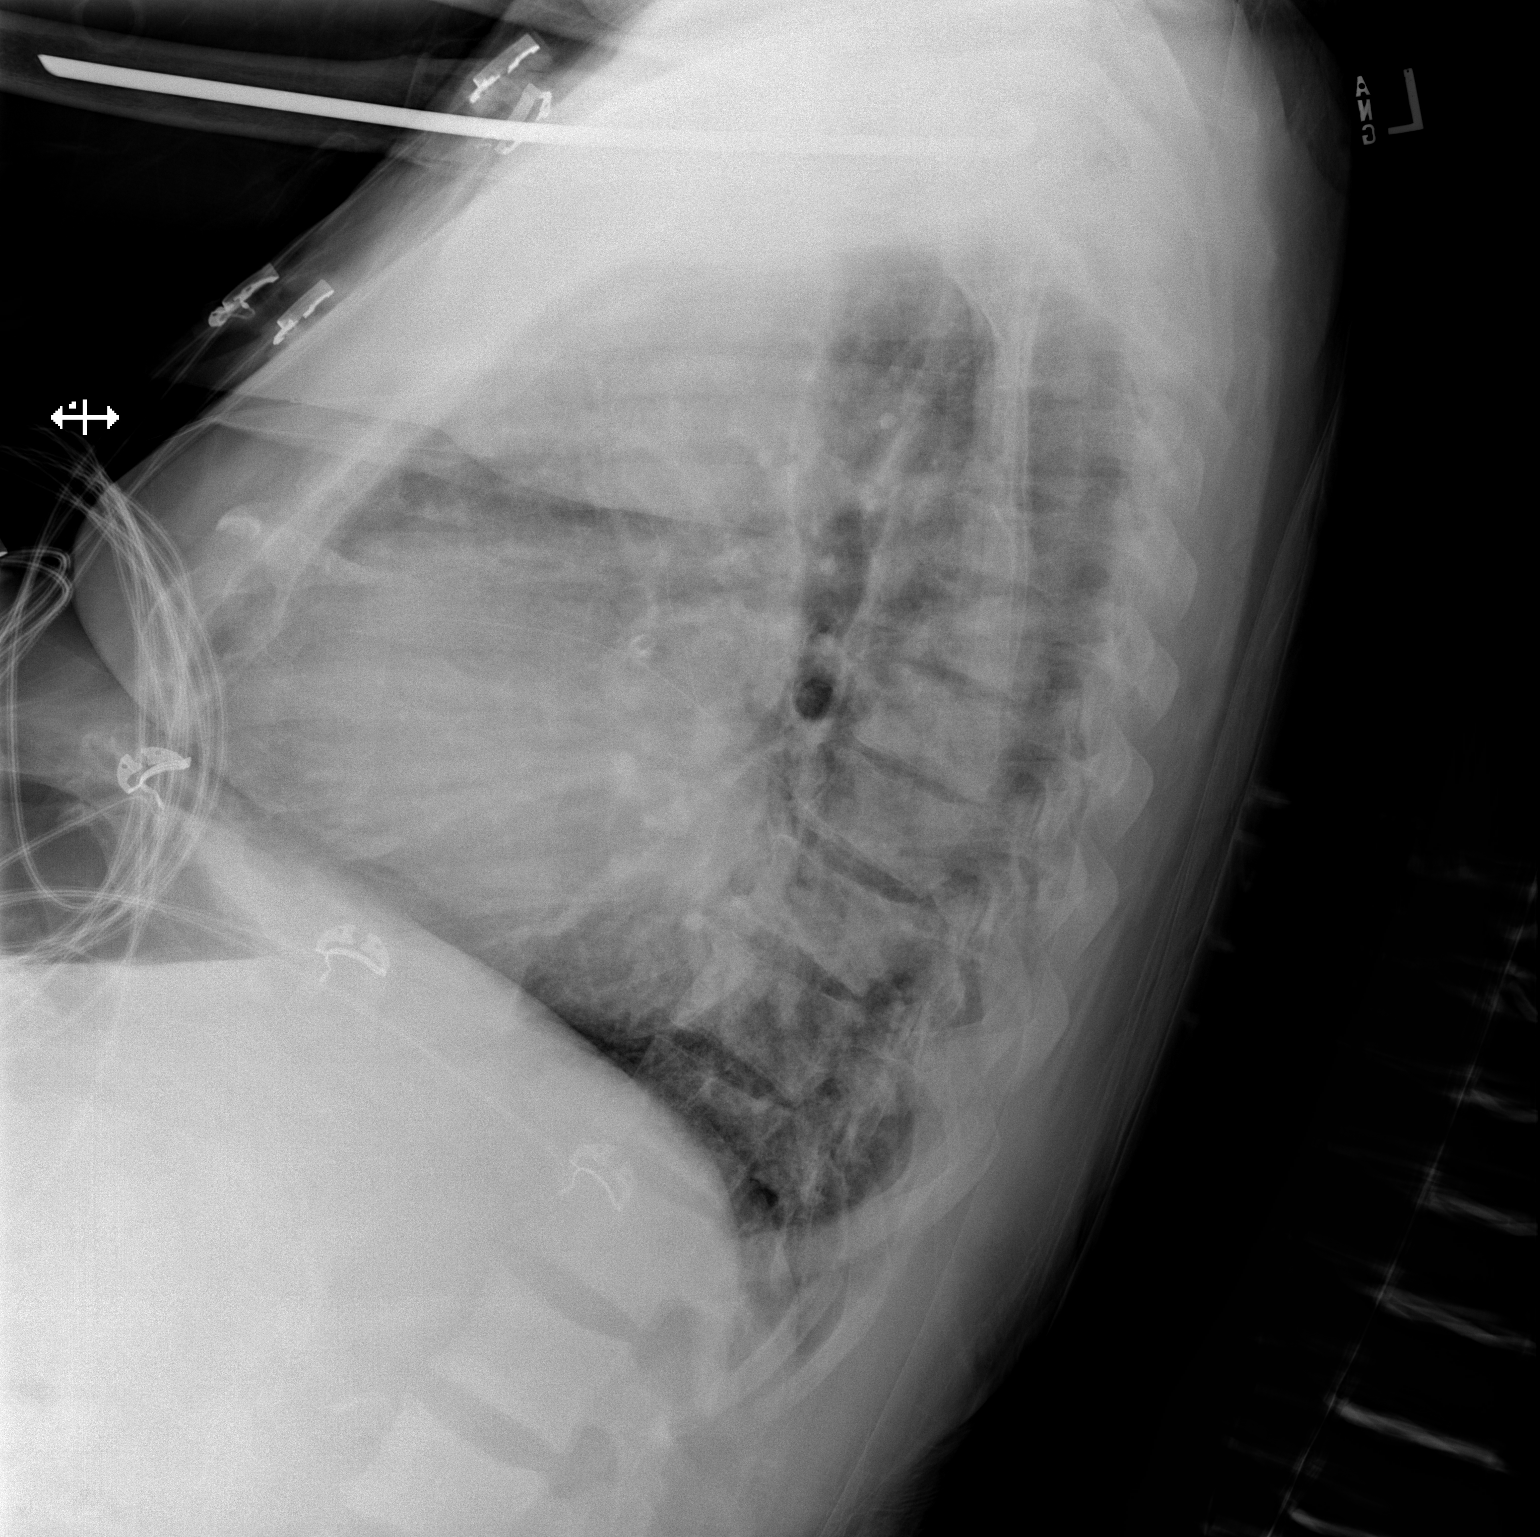

[x chest ap]
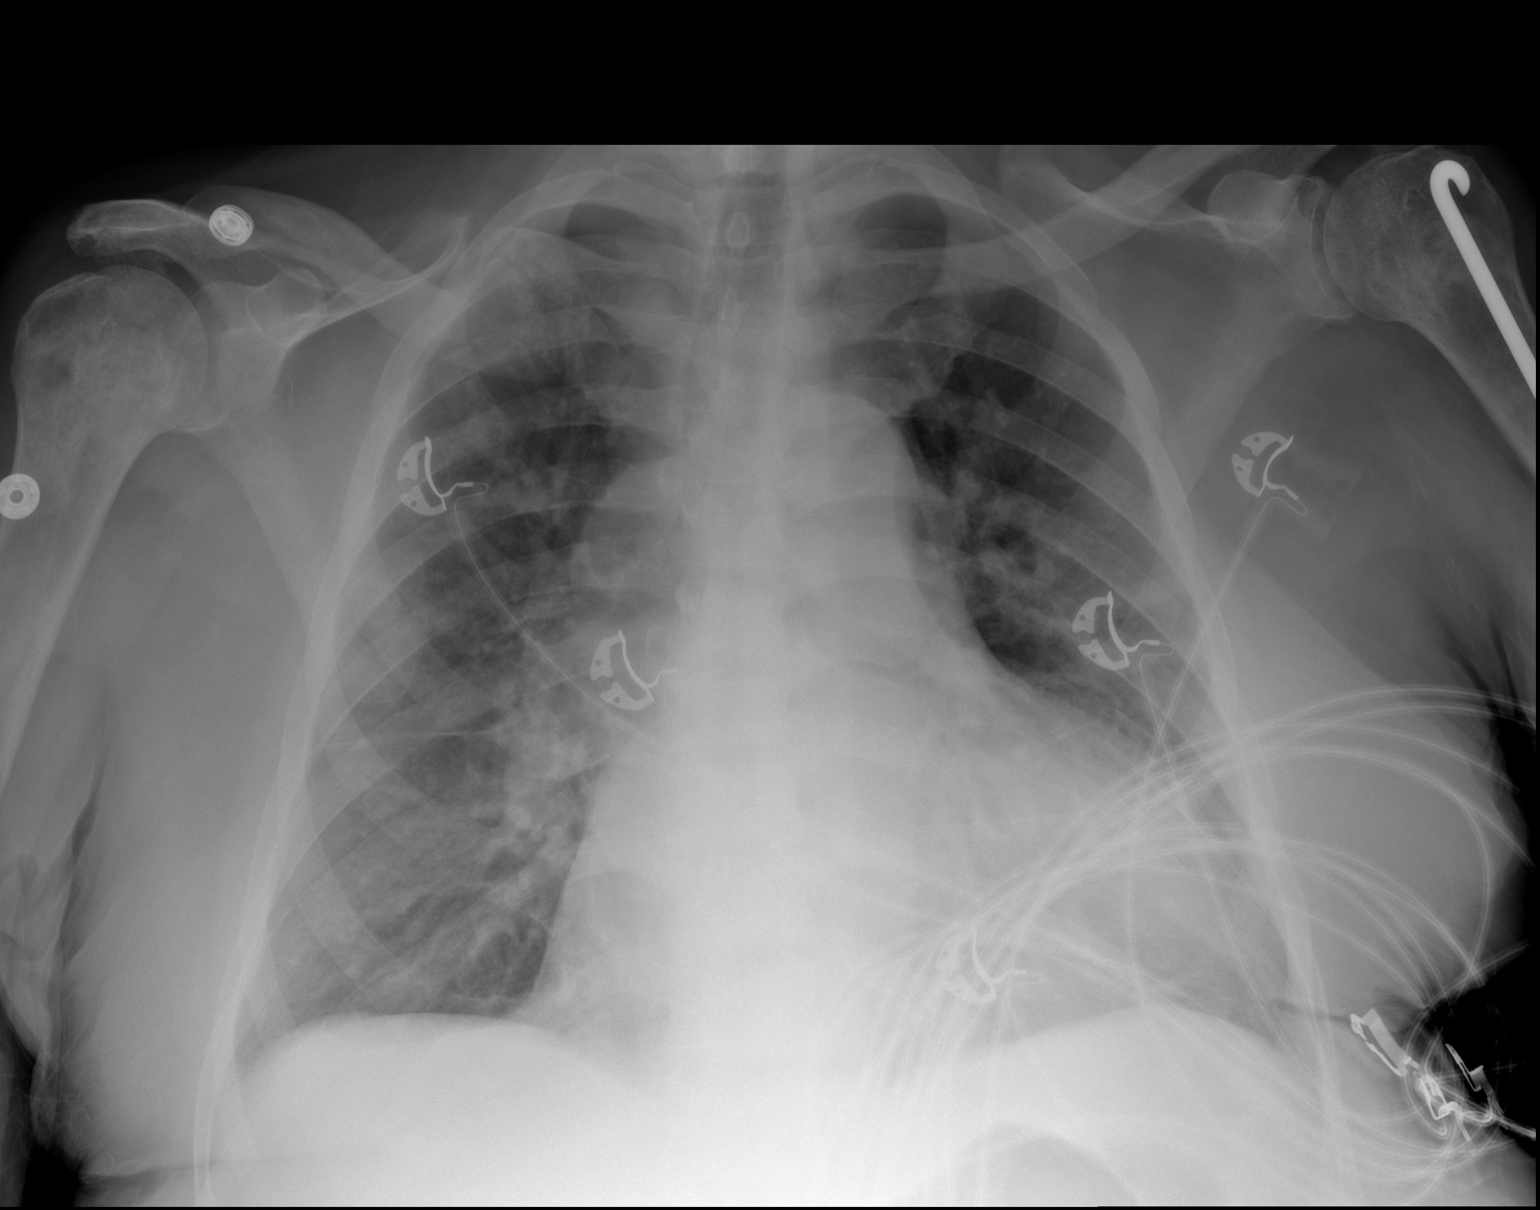

[2 of 2 positions shown; findings below may reference images not displayed]

FINDINGS: Monitoring leads overlie the patient. Stable cardiomegaly. No large
area pulmonary consolidation. No pleural effusion or pneumothorax.
Patchy sclerotic osseous changes throughout the axial and
appendicular skeleton compatible with history of metastatic prostate
cancer.
IMPRESSION: Cardiomegaly. No acute cardiopulmonary process.
